# Patient Record
Sex: Female | Born: 1970 | Race: Black or African American | Hispanic: No | Marital: Married | Smoking: Never smoker
Health system: Southern US, Community
[De-identification: ages and names within clinical notes are randomized; demographics above are authoritative.]

## PROBLEM LIST (undated history)

## (undated) DIAGNOSIS — Z9221 Personal history of antineoplastic chemotherapy: Secondary | ICD-10-CM

## (undated) DIAGNOSIS — R519 Headache, unspecified: Secondary | ICD-10-CM

## (undated) DIAGNOSIS — C50919 Malignant neoplasm of unspecified site of unspecified female breast: Secondary | ICD-10-CM

## (undated) DIAGNOSIS — Z923 Personal history of irradiation: Secondary | ICD-10-CM

## (undated) DIAGNOSIS — R51 Headache: Secondary | ICD-10-CM

## (undated) DIAGNOSIS — A6000 Herpesviral infection of urogenital system, unspecified: Secondary | ICD-10-CM

## (undated) DIAGNOSIS — C801 Malignant (primary) neoplasm, unspecified: Secondary | ICD-10-CM

## (undated) DIAGNOSIS — D649 Anemia, unspecified: Secondary | ICD-10-CM

## (undated) DIAGNOSIS — K219 Gastro-esophageal reflux disease without esophagitis: Secondary | ICD-10-CM

## (undated) DIAGNOSIS — J189 Pneumonia, unspecified organism: Secondary | ICD-10-CM

## (undated) DIAGNOSIS — J45909 Unspecified asthma, uncomplicated: Secondary | ICD-10-CM

## (undated) HISTORY — DX: Malignant neoplasm of unspecified site of unspecified female breast: C50.919

## (undated) HISTORY — PX: MYOMECTOMY ABDOMINAL APPROACH: SUR870

## (undated) HISTORY — DX: Herpesviral infection of urogenital system, unspecified: A60.00

---

## 2000-08-13 ENCOUNTER — Other Ambulatory Visit: Admission: RE | Admit: 2000-08-13 | Discharge: 2000-08-13 | Payer: Self-pay | Admitting: Obstetrics and Gynecology

## 2001-10-06 ENCOUNTER — Other Ambulatory Visit: Admission: RE | Admit: 2001-10-06 | Discharge: 2001-10-06 | Payer: Self-pay | Admitting: Obstetrics and Gynecology

## 2002-11-22 ENCOUNTER — Other Ambulatory Visit: Admission: RE | Admit: 2002-11-22 | Discharge: 2002-11-22 | Payer: Self-pay | Admitting: Obstetrics and Gynecology

## 2003-02-02 ENCOUNTER — Encounter (INDEPENDENT_AMBULATORY_CARE_PROVIDER_SITE_OTHER): Payer: Self-pay

## 2003-02-02 ENCOUNTER — Ambulatory Visit (HOSPITAL_COMMUNITY): Admission: RE | Admit: 2003-02-02 | Discharge: 2003-02-02 | Payer: Self-pay | Admitting: Obstetrics and Gynecology

## 2003-11-26 ENCOUNTER — Other Ambulatory Visit: Admission: RE | Admit: 2003-11-26 | Discharge: 2003-11-26 | Payer: Self-pay | Admitting: Obstetrics and Gynecology

## 2005-01-22 ENCOUNTER — Other Ambulatory Visit: Admission: RE | Admit: 2005-01-22 | Discharge: 2005-01-22 | Payer: Self-pay | Admitting: Obstetrics and Gynecology

## 2006-08-17 ENCOUNTER — Inpatient Hospital Stay (HOSPITAL_COMMUNITY): Admission: RE | Admit: 2006-08-17 | Discharge: 2006-08-19 | Payer: Self-pay | Admitting: Obstetrics and Gynecology

## 2006-08-17 ENCOUNTER — Encounter (INDEPENDENT_AMBULATORY_CARE_PROVIDER_SITE_OTHER): Payer: Self-pay | Admitting: *Deleted

## 2007-03-09 ENCOUNTER — Encounter: Admission: RE | Admit: 2007-03-09 | Discharge: 2007-03-09 | Payer: Self-pay | Admitting: Family Medicine

## 2010-10-17 NOTE — Op Note (Signed)
NAME:  Paula Berry, Paula Berry              ACCOUNT NO.:  [id number]   MEDICAL RECORD NO.:  [id number]          PATIENT TYPE:  AMB   LOCATION:  SDC                           FACILITY:  [facility]   PHYSICIAN:  Guy Sandifer. Henderson Cloud, M.D. DATE OF BIRTH:  [birth year]   DATE OF PROCEDURE:  08/17/2006  DATE OF DISCHARGE:                               OPERATIVE REPORT   PREOPERATIVE DIAGNOSES:  1. Uterine leiomyomata.  2. Possible endometrial polyps.   POSTOPERATIVE DIAGNOSES:  1. Uterine leiomyomata.  2. Possible endometrial polyps.   PROCEDURE:  1. Abdominal uterine myomectomy.  2. Hysteroscopy.  3. Dilatation and curettage with 1% Xylocaine paracervical block.   SURGEON:  Harold Hedge, MD.   ASSISTANT:  Richardean Chimera, MD   ANESTHESIA:  General with endotracheal intubation.   SPECIMENS:  1. Uterine leiomyomata.  2. Endometrial curettings.  Both to pathology.   ESTIMATED BLOOD LOSS:  250 mL.   I&O WITH SORBITOL DISTENDING MEDIA:  45 mL deficit.   INDICATIONS AND CONSENT:  This patient is a 40 year old married black  female, G0, P0 with known enlarging uterine leiomyomata.  She has  increasingly heavy menses.  Sonohistogram in the office is suggestive of  some endometrial polypoid processes.  After careful discussion of the  options, abdominal uterine myomectomy, hysteroscopy, dilatation  curettage is discussed with the patient.  Potential risks and  complications are discussed preoperatively including but not limited to  infection, uterine perforation, bowel, bladder, ureteral damage,  bleeding requiring transfusion of blood products with possible  transfusion reaction, HIV and hepatitis acquisition, DVT, PE, pneumonia.  A possible recurrent uterine leiomyomata and abnormal bleeding is  discussed.  Possible fallopian tube obstruction and secondary  infertility and the need for cesarean section for delivery is also  discussed.  All questions are answered.   FINDINGS:  There is a shallow  ridge in the mid upper endometrial cavity  on the posterior wall.  There are no distinct polypoid processes.  Fallopian tube ostia are identified bilaterally.  Abdominally the uterus  has multiple leiomyomata on the uterine fundus and posterior uterine  wall ranging from 2 cm to approximately 8 cm.  Fallopian tube and  fimbriae are normal bilaterally, and the ovaries are normal bilaterally.   PROCEDURE:  The patient is taken to the operating room.  She is  identified, placed in dorsosupine position, and general anesthesia is  induced via endotracheal intubation.  She is placed in the dorsal  lithotomy position where she is prepped, bladder straight catheterized,  and she is draped in a sterile fashion.  Bivalve speculum is placed in  the vagina and anterior cervical lip is injected with 1% Xylocaine and  grasped with a single-tooth tenaculum.  Paracervical block of  approximately 20 mL total of 1% plain Xylocaine is placed in the 2, 4,  5, 7, 8, and 10 o'clock positions.  Cervix is gently progressively  dilated to a 23 dilator.  Diagnostic hysteroscope is placed in the  endocervical canal and advanced under direct visualization using  sorbitol distending media.  The above findings are noted.  Hysteroscope  is withdrawn.  Sharp curettage is carried out.  Tenaculum is removed,  and good hemostasis is noted.  The patient is then placed in the dorsal  supine position after the drapes are removed.  She is then reprepped  abdominally and vaginally; Foley catheter is placed, and the bladder is  drained, and she is draped in a sterile fashion.  Pfannenstiel incision  is used and dissection is carried out in layers to the peritoneal  cavity.  Peritoneum is entered and extended superiorly and inferiorly.  O'Sullivan-O'Connor retractor is placed.  The bladder blade is placed.  Both sides of the retractor are padded with folded towels to elevate the  retractor.  The bowel is packed away, and the  upper blade is placed.  The multiple leiomyomata are removed using cautery and blunt and sharp  dissection.  Each defect is then repaired in multiple layers with 0  Monocryl pops.  It did not appear that the endometrial cavity was  entered with any of these, although it was 1/2-3/4 of the depth of the  myometrium in some portions.  Fibroids numbered approximately 8 or 9.  At the end of the case, good hemostasis is noted.  Copious irrigation is  carried out, and all returns as clear.  Intercede is placed over the  anterior and posterior parts of the uterus.  Packs and retractors are  then removed.  The anterior peritoneum is closed in a running fashion  with 0 Monocryl suture which is also used to reapproximate the  pyramidalis muscle in the midline.  Anterior rectus fascia is closed in  a running fashion with 0 PDS suture, and the skin is closed with clips.  All sponge, instrument, and needle counts are correct, and the patient  is transferred to the recovery room in stable condition.      Guy Sandifer Henderson Cloud, M.D.  Electronically Signed     JET/MEDQ  D:  08/17/2006  T:  08/17/2006  Job:  [id number]

## 2010-10-17 NOTE — H&P (Signed)
   NAME:  Paula Berry, Paula Berry                       ACCOUNT NO.:  [id number]   MEDICAL RECORD NO.:  [id number]                   PATIENT TYPE:  AMB   LOCATION:  SDC                                  [facility]ILITY[facility]   PHYSICIAN:  Guy Sandifer. Arleta Creek, M.D.           DATE OF BIRTH:  September 01, 1970   DATE OF ADMISSION:  02/02/2003  DATE OF DISCHARGE:                                HISTORY & PHYSICAL   CHIEF COMPLAINT:  Heavy menses.   HISTORY OF PRESENT ILLNESS:  This patient is a 40 year old single black  female; G1, P1, complaining of increasingly heavy menses.  She has two to  three days of heavy bleeding, changing a tampon or pad every 1-2 hr.  On  ultrasound in my office on 12/11/02 the uterus measured  8.1 x 4.8 x 5.2 cm.  Multiple uterine leiomyomata were noted, ranging from 9  mm to 4.5 cm.  Sonohistogram was consistent with 18 mm endocavitary mass.  After discussion of the options of management, she is being admitted for  hysteroscopy D&C.   PAST MEDICAL HISTORY:  1. Herpes.  2. Childhood asthma.  3. History of headache.   PAST SURGICAL HISTORY:  Negative.   OBSTETRICAL HISTORY:  Termination x1.   MEDICATIONS:  1. Valtrex as needed.  2. Maxalt p.r.n. headache.   ALLERGIES:  NO KNOWN DRUG ALLERGIES.   SOCIAL HISTORY:  The patient denies tobacco, alcohol or drug abuse.   FAMILY HISTORY:  Positive for type 2 diabetes in maternal grandmother.  Lung  cancer in maternal grandmother.  Chronic hypertension in all grandparents.   REVIEW OF SYSTEMS:  NEUROLOGIC:  History of headaches, as above.  CARDIAC:  Denies chest pain.  PULMONARY:  History of childhood asthma; no trouble as  an adult.  GI:  Denies recent changes in bowel habits.   PHYSICAL EXAMINATION:  VITAL SIGNS:  Height 5 feet 10 inches, weight 164  pounds, blood pressure 118/64.  HEENT:  Without thyromegaly.  LUNGS:  Clear to auscultation.  HEART:  Regular rate and rhythm.  BACK:  Without CVA tenderness.  BREASTS:   Without mass, retraction, discharge.  ABDOMEN:  Soft and nontender; without masses.  PELVIC:  Vulva, vagina and cervix without lesions.  Uterus is retroverted,  six weeks in size.  Adnexa nontender without masses.  EXTREMITIES:  Grossly within normal limits.  NEUROLOGIC:  Grossly within normal limits.   ASSESSMENT:  Menorrhagia.   PLAN:  Hysteroscopy with resectoscope, dilatation and curettage.                                               Guy Sandifer Arleta Creek, M.D.    JET/MEDQ  D:  01/31/2003  T:  01/31/2003  Job:  [id number]

## 2010-10-17 NOTE — H&P (Signed)
NAME:  Paula Berry, Paula Berry              ACCOUNT NO.:  [id number]   MEDICAL RECORD NO.:  [id number]          PATIENT TYPE:  AMB   LOCATION:                                 FACILITY:   PHYSICIAN:  Guy Sandifer. Henderson Cloud, M.D. DATE OF BIRTH:  [birth year]   DATE OF ADMISSION:  DATE OF DISCHARGE:                              HISTORY & PHYSICAL   HISTORY AND PHYSICAL   For admission to [facility], [place], [place]  on August 17, 2006.   CHIEF COMPLAINT:  Heavy menses and fibroids.   HISTORY OF PRESENT ILLNESS:  This patient is a 40 year old, married,  black female, G0, P0 with known enlarging uterine leiomyomata.  She has  been having increasingly heavy menses.  Ultrasound in my office on  March 18, 2006 revealed multiple leiomyomata ranging from 7.8 cm to 1  cm.  After discussion of the options, the patient is being admitted for  uterine myomectomy.  Potential risks and complications have been  discussed preoperatively.   PAST MEDICAL HISTORY:  1. Lobar HSV.  2. Migraine headaches.   OBSTETRIC HISTORY:  TAB times 1.   FAMILY HISTORY:  Diabetes maternal grandmother.  Lung cancer paternal  grandmother.  Chronic hypertension all grandparents.   SOCIAL HISTORY:  Denies tobacco, alcohol or drug abuse.   MEDICATIONS:  Maxalt p.r.n.  Valtrex p.r.n.   ALLERGIES:  No known drug allergies.   REVIEW OF SYSTEMS:  Neuro:  History of migraine headache, as above.  Pulmonary:  Denies shortness of breath.  GI:  Denies recent changes in  bowel habits.  Cardiac:  Denies chest pain.   PHYSICAL EXAMINATION:  Height 5 feet 9 inches.  Weight 169 pounds.  Blood pressure 114/78.  HEENT:  Without thyromegaly.  Lungs:  Clear to  auscultation.  Heart:  Regular rate and rhythm.  Neck:  Without CVA  tenderness.  Breasts:  Without masses or discharge.  Abdomen:  Soft,  nontender without masses.  Pelvic exam:  Vulva, vagina and cervix  without lesion.  Uterus retroverted, filling the  pelvis.  Adnexal exam  compromised.  Extremities grossly within normal limits.  Neurological  exam:  Grossly within normal limits.   ASSESSMENT:  Uterine leiomyomata.   PLAN:  Abdominal uterine myomectomy.      Guy Sandifer Henderson Cloud, M.D.  Electronically Signed     JET/MEDQ  D:  08/06/2006  T:  08/06/2006  Job:  [id number]

## 2010-10-17 NOTE — Op Note (Signed)
NAME:  Paula Berry, Paula Berry                       ACCOUNT NO.:  [id number]   MEDICAL RECORD NO.:  [id number]                   PATIENT TYPE:  AMB   LOCATION:  SDC                                  [facility]   PHYSICIAN:  Guy Sandifer. Arleta Creek, M.D.           DATE OF BIRTH:  November 08, 1970   DATE OF PROCEDURE:  02/02/2003  DATE OF DISCHARGE:                                 OPERATIVE REPORT   PREOPERATIVE DIAGNOSIS:  Menorrhagia.   POSTOPERATIVE DIAGNOSIS:  Menorrhagia.   PROCEDURE:  1. Hysteroscopy with resection of endometrial masses.  2. Dilatation & curettage.   SURGEON:  Guy Sandifer. Henderson Cloud, M.D.   ANESTHESIA:  1. Monitored anesthesia care.  2. Xylocaine 1% paracervical block.   ESTIMATED BLOOD LOSS:  Less than 50 cc.   SPECIMENS:  1. Endometrial biopsy.  2. Endometrial curettings.   INPUT AND OUTPUT:  Sorbitol distending media 150 cc deficit, with much of  that on the floor.   INDICATIONS AND CONSENT:  This patient is a 40 year old black female, with  increasingly heavy menses.  Details are dictated in the history and  physical.  Hysteroscopy with resectoscope and dilatation and curettage has  been discussed.  The potential risks and complications were discussed  preoperatively, including but not limited to infection, uterine perforation;  bowel, bladder or ureteral damage; bleeding requiring transfusion of blood  products and possible transfusion reaction, HIV and Hepatitis acquisition;  DVT, PE and pneumonia; hysterectomy; recurrent abnormal bleeding.  All  questions are answered and consent is signed on the chart.   FINDINGS:  There are multiple 5-10 mm polypoid processes on the apex of the  endometrial canal, as well as along the posterior and side walls of the  endometrial canal.  Fallopian tube ostia are identified bilaterally.   PROCEDURE:  The patient is given IV antibiotics.  She is taken to the  operating room and placed in the dorsal supine position, where  sedation is  given.  She is then placed in the dorsal lithotomy position, where she is  gently prepped, bladder straight catheterized and draped in the sterile  fashion.  The anterior cervical lip is injected with 1% Xylocaine and  grasped with a single-toothed tenaculum.  Paracervical block is then placed  at the 2, 4, 5, 7, 8 and 10 o'clock positions; with approximately 20 cc  total of 1% Xylocaine plain.  The cervix is then progressively dilated to a  23 Pratt dilator, and a diagnostic hysteroscope is placed in the  endocervical canal and advanced under direct visualization using Sorbitol  distending media.  The above findings are noted.  Hysteroscope is withdrawn.  Cervix is dilated to a 33 Pratt dilator, and the resectoscope with the  single right-angled wire loop is placed, and the above-  described polypoid processes are removed and sent as a single specimen.  Hysteroscope is then withdrawn and sharp curettage is carried out.  The  tenaculum is removed.  Good hemostasis is noted.  All counts are correct.  The patient is awakened and taken to the recovery room in stable condition.                                               Guy Sandifer Arleta Creek, M.D.    JET/MEDQ  D:  02/02/2003  T:  02/03/2003  Job:  [id number]

## 2010-10-17 NOTE — Discharge Summary (Signed)
NAMECORINNA, Paula Berry              ACCOUNT NO.:  [id number]   MEDICAL RECORD NO.:  [id number]          PATIENT TYPE:  INP   LOCATION:  9312                          [facility]ACILITY:  [facility]   PHYSICIAN:  Guy Sandifer. Henderson Cloud, M.D. DATE OF BIRTH:  1970/09/02   DATE OF ADMISSION:  08/17/2006  DATE OF DISCHARGE:  08/19/2006                               DISCHARGE SUMMARY   ADMITTING DIAGNOSIS:  Uterine myomata and possible endometrial polyps.   DISCHARGE DIAGNOSIS:  Uterine  myomata and possible endometrial polyps.   PROCEDURE:  On August 17, 2006, abdominal uterine myomectomy,  hysteroscopy, dilatation and curettage.   REASON FOR ADMISSION:  This patient is a 40 year old, black female, G0  P0 with enlarging uterine myomata.  Details have been dictated in the  history and physical.  She is admitted for surgical management.   HOSPITAL COURSE:  The patient is admitted to the hospital, undergoes the  above procedure.  On the evening of surgery she is having some nausea,  controlled with medication.  Vital signs are stable.  She is afebrile  with good urine output.  On the first postoperative day, she is passing  flatus, has much less nausea, and is tolerating a regular diet.  Hemoglobin is 8.7, and pathology is pending.  On the day of discharge,  she is ambulating well, tolerating a regular diet.   CONDITION ON DISCHARGE:  Good.   DIET:  Regular as tolerated.   ACTIVITY:  No lifting, no operation of automobile, no vaginal entry.   She is to call the office if problems including, but not limited to,  temperature of 101 degrees, heavy vaginal bleeding, persistent nausea,  vomiting, or increasing pain.   MEDICATIONS:  1. Percocet 5/325 mg, #40, one to two p.o. q.6 h. p.r.n.  2. Ibuprofen 600 mg q.6 h. p.r.n.  3. Iron supplement, one p.o. b.i.d.   FOLLOWUP:  In the office in one week for staple removal and two weeks  for a post-op check.      Guy Sandifer Henderson Cloud, M.D.  Electronically  Signed     JET/MEDQ  D:  08/19/2006  T:  08/19/2006  Job:  [id number]

## 2013-05-12 ENCOUNTER — Other Ambulatory Visit: Payer: Self-pay | Admitting: Obstetrics and Gynecology

## 2013-05-12 DIAGNOSIS — N6310 Unspecified lump in the right breast, unspecified quadrant: Secondary | ICD-10-CM

## 2013-05-22 ENCOUNTER — Ambulatory Visit
Admission: RE | Admit: 2013-05-22 | Discharge: 2013-05-22 | Disposition: A | Discharge status: Home / Self Care | Payer: BC Managed Care – PPO | Source: Ambulatory Visit | Attending: Obstetrics and Gynecology | Admitting: Obstetrics and Gynecology

## 2013-05-22 DIAGNOSIS — N6310 Unspecified lump in the right breast, unspecified quadrant: Secondary | ICD-10-CM

## 2015-06-24 ENCOUNTER — Other Ambulatory Visit: Payer: Self-pay | Admitting: Obstetrics and Gynecology

## 2015-06-24 DIAGNOSIS — R928 Other abnormal and inconclusive findings on diagnostic imaging of breast: Secondary | ICD-10-CM

## 2015-07-12 ENCOUNTER — Ambulatory Visit
Admission: RE | Admit: 2015-07-12 | Discharge: 2015-07-12 | Disposition: A | Discharge status: Home / Self Care | Payer: BC Managed Care – PPO | Source: Ambulatory Visit | Attending: Obstetrics and Gynecology | Admitting: Obstetrics and Gynecology

## 2015-07-12 DIAGNOSIS — R928 Other abnormal and inconclusive findings on diagnostic imaging of breast: Secondary | ICD-10-CM

## 2015-08-05 ENCOUNTER — Encounter (HOSPITAL_COMMUNITY): Payer: BC Managed Care – PPO

## 2015-08-09 ENCOUNTER — Other Ambulatory Visit (HOSPITAL_COMMUNITY): Payer: Self-pay | Admitting: *Deleted

## 2015-08-12 ENCOUNTER — Encounter (HOSPITAL_COMMUNITY)
Admission: RE | Admit: 2015-08-12 | Discharge: 2015-08-12 | Disposition: A | Discharge status: Home / Self Care | Payer: BC Managed Care – PPO | Source: Ambulatory Visit | Attending: Obstetrics and Gynecology | Admitting: Obstetrics and Gynecology

## 2015-08-12 ENCOUNTER — Ambulatory Visit (HOSPITAL_COMMUNITY)
Admission: RE | Admit: 2015-08-12 | Discharge: 2015-08-12 | Disposition: A | Discharge status: Home / Self Care | Payer: BC Managed Care – PPO | Source: Ambulatory Visit | Attending: Obstetrics and Gynecology | Admitting: Obstetrics and Gynecology

## 2015-08-12 ENCOUNTER — Encounter (HOSPITAL_COMMUNITY): Payer: Self-pay

## 2015-08-12 DIAGNOSIS — Z01812 Encounter for preprocedural laboratory examination: Secondary | ICD-10-CM | POA: Insufficient documentation

## 2015-08-12 DIAGNOSIS — Z01818 Encounter for other preprocedural examination: Secondary | ICD-10-CM | POA: Insufficient documentation

## 2015-08-12 DIAGNOSIS — D649 Anemia, unspecified: Secondary | ICD-10-CM | POA: Insufficient documentation

## 2015-08-12 DIAGNOSIS — R7989 Other specified abnormal findings of blood chemistry: Secondary | ICD-10-CM | POA: Diagnosis not present

## 2015-08-12 HISTORY — DX: Gastro-esophageal reflux disease without esophagitis: K21.9

## 2015-08-12 HISTORY — DX: Headache: R51

## 2015-08-12 HISTORY — DX: Pneumonia, unspecified organism: J18.9

## 2015-08-12 HISTORY — DX: Unspecified asthma, uncomplicated: J45.909

## 2015-08-12 HISTORY — DX: Headache, unspecified: R51.9

## 2015-08-12 HISTORY — DX: Anemia, unspecified: D64.9

## 2015-08-12 LAB — COMPREHENSIVE METABOLIC PANEL
ALBUMIN: 4 g/dL (ref 3.5–5.0)
ALT: 9 U/L — ABNORMAL LOW (ref 14–54)
ANION GAP: 6 (ref 5–15)
AST: 15 U/L (ref 15–41)
Alkaline Phosphatase: 53 U/L (ref 38–126)
BUN: 11 mg/dL (ref 6–20)
CHLORIDE: 105 mmol/L (ref 101–111)
CO2: 26 mmol/L (ref 22–32)
Calcium: 8.9 mg/dL (ref 8.9–10.3)
Creatinine, Ser: 0.63 mg/dL (ref 0.44–1.00)
GFR calc non Af Amer: >60 mL/min (ref >60)
GLUCOSE: 101 mg/dL — AB (ref 65–99)
POTASSIUM: 4 mmol/L (ref 3.5–5.1)
SODIUM: 137 mmol/L (ref 135–145)
Total Bilirubin: 1.2 mg/dL (ref 0.3–1.2)
Total Protein: 7.3 g/dL (ref 6.5–8.1)

## 2015-08-12 LAB — CBC
HEMATOCRIT: 35.4 % — AB (ref 36.0–46.0)
HEMOGLOBIN: 11.1 g/dL — AB (ref 12.0–15.0)
MCH: 25.5 pg — AB (ref 26.0–34.0)
MCHC: 31.4 g/dL (ref 30.0–36.0)
MCV: 81.2 fL (ref 78.0–100.0)
Platelets: 256 10*3/uL (ref 150–400)
RBC: 4.36 MIL/uL (ref 3.87–5.11)
RDW: 17.4 % — ABNORMAL HIGH (ref 11.5–15.5)
WBC: 7.1 10*3/uL (ref 4.0–10.5)

## 2015-08-12 LAB — TYPE AND SCREEN
ABO/RH(D): AB POS
ANTIBODY SCREEN: NEGATIVE

## 2015-08-12 LAB — ABO/RH: ABO/RH(D): AB POS

## 2015-08-12 MED ORDER — SODIUM CHLORIDE 0.9 % IV SOLN
510.0000 mg | Freq: Once | INTRAVENOUS | Status: AC
  Administered 2015-08-12: 510 mg via INTRAVENOUS
  Filled 2015-08-12: qty 17

## 2015-08-12 NOTE — H&P (Signed)
Paula Berry is an 45 y.o. female with pelvic pressure, urinary frequency and heavy menses. U/S in office C/W uterine fibroids with total uterus 22.5x9.5x13.9 cm. No adnexal masses seen.  Pertinent Gynecological History: Menses: flow is excessive with use of many pads or tampons on heaviest days Bleeding: dysfunctional uterine bleeding Contraception: none DES exposure: denies Blood transfusions: none Sexually transmitted diseases: no past history Previous GYN Procedures: none  Last mammogram: normal Date: 2/17 Last pap: normal Date: 1/17 OB History: G1, P0   Menstrual History: Menarche age: unknown  No LMP recorded.    Past Medical History  Diagnosis Date  . Asthma   . Pneumonia     hospitalizied for pneumonia as a child  . GERD (gastroesophageal reflux disease)   . Headache     migraines 2-3 times monthly  . Anemia     had iron infusion 08/12/15    Past Surgical History  Procedure Laterality Date  . Myomectomy abdominal approach      No family history on file.  Social History:  reports that she has never smoked. She has never used smokeless tobacco. She reports that she drinks about 0.6 oz of alcohol per week. She reports that she does not use illicit drugs.  Allergies:  Allergies  Allergen Reactions  . Neosporin [Neomycin-Bacitracin Zn-Polymyx]     No prescriptions prior to admission    Review of Systems  Constitutional: Negative for fever.    There were no vitals taken for this visit. Physical Exam  Cardiovascular: Normal rate and regular rhythm.   Respiratory: Effort normal and breath sounds normal.  GI: Soft. She exhibits mass.  Mass arising from pelvis to umbilicus  Genitourinary:  Pelvic exam compromised by uterus enlarged to about 20 weeks size    Results for orders placed or performed during the hospital encounter of 08/12/15 (from the past 24 hour(s))  CBC *Canceled*     Status: None ()   Collection Time: 08/12/15  1:46 PM   Narrative   LIS Cancel (ORR/DE = Data Error)  Comprehensive metabolic panel *Canceled*     Status: None ()   Collection Time: 08/12/15  1:46 PM   Narrative   LIS Cancel (ORR/DE = Data Error)  Type and screen [place] *Canceled*     Status: None ()   Collection Time: 08/12/15  1:46 PM   Narrative   LIS Cancel (ORR/DE = Data Error)  CBC     Status: Abnormal   Collection Time: 08/12/15  3:15 PM  Result Value Ref Range   WBC 7.1 4.0 - 10.5 K/uL   RBC 4.36 3.87 - 5.11 MIL/uL   Hemoglobin 11.1 (L) 12.0 - 15.0 g/dL   HCT 35.4 (L) 36.0 - 46.0 %   MCV 81.2 78.0 - 100.0 fL   MCH 25.5 (L) 26.0 - 34.0 pg   MCHC 31.4 30.0 - 36.0 g/dL   RDW 17.4 (H) 11.5 - 15.5 %   Platelets 256 150 - 400 K/uL  Type and screen [place]     Status: None   Collection Time: 08/12/15  3:15 PM  Result Value Ref Range   ABO/RH(D) AB POS    Antibody Screen NEG    Sample Expiration 08/26/2015    Extend sample reason NO TRANSFUSIONS OR PREGNANCY IN THE PAST 3 MONTHS   Comprehensive metabolic panel     Status: Abnormal   Collection Time: 08/12/15  3:15 PM  Result Value Ref Range   Sodium  137 135 - 145 mmol/L   Potassium 4.0 3.5 - 5.1 mmol/L   Chloride 105 101 - 111 mmol/L   CO2 26 22 - 32 mmol/L   Glucose, Bld 101 (H) 65 - 99 mg/dL   BUN 11 6 - 20 mg/dL   Creatinine, Ser 0.63 0.44 - 1.00 mg/dL   Calcium 8.9 8.9 - 10.3 mg/dL   Total Protein 7.3 6.5 - 8.1 g/dL   Albumin 4.0 3.5 - 5.0 g/dL   AST 15 15 - 41 U/L   ALT 9 (L) 14 - 54 U/L   Alkaline Phosphatase 53 38 - 126 U/L   Total Bilirubin 1.2 0.3 - 1.2 mg/dL   GFR calc non Af Amer >60 >60 mL/min   GFR calc Af Amer >60 >60 mL/min   Anion gap 6 5 - 15    No results found.   Feraheme infusion done pre-op  Assessment/Plan: 45 yo MBF G1P0 with large, symptomatic fibroids D/W patient TAH/BS with removal of one or both ovaries if abnormal D/W risks including infection, organ damage, bleeding/transfusion-HIV/Hep,  DVT/PE, pneumonia, return to OR, pelvic pain, abdominal pain, painful intercourse, fistula. All questions answered. Patient states she understands and agrees  Benjiman Core E 08/12/2015, 5:44 PM

## 2015-08-12 NOTE — Patient Instructions (Addendum)
   Your procedure is scheduled on: MARCH 23 (THURSDAY)  Enter through the Main Entrance of [facility] at: 6AM   Pick up the phone at the desk and dial [phone] and inform us of your arrival.  Please call this number if you have any problems the morning of surgery: [phone]  DO NOT EAT OR DRINK AFTER MIDNIGHT MARCH 22   [name]  Do not wear jewelry, make-up, No metal in your hair or on your body. Do not wear lotions, powders, perfumes.  You may wear deodorant.  Do not bring valuables to the hospital. Contacts, dentures or bridgework may not be worn into surgery.  Leave suitcase in the car. After Surgery it may be brought to your room. For patients being admitted to the hospital, checkout time is 11:00am the day of discharge.

## 2015-08-15 ENCOUNTER — Ambulatory Visit (HOSPITAL_COMMUNITY)
Admission: RE | Admit: 2015-08-15 | Discharge: 2015-08-15 | Disposition: A | Discharge status: Home / Self Care | Payer: BC Managed Care – PPO | Source: Ambulatory Visit | Attending: Obstetrics and Gynecology | Admitting: Obstetrics and Gynecology

## 2015-08-15 DIAGNOSIS — R7989 Other specified abnormal findings of blood chemistry: Secondary | ICD-10-CM | POA: Diagnosis not present

## 2015-08-15 DIAGNOSIS — D649 Anemia, unspecified: Secondary | ICD-10-CM | POA: Insufficient documentation

## 2015-08-15 MED ORDER — SODIUM CHLORIDE 0.9 % IV SOLN
510.0000 mg | Freq: Once | INTRAVENOUS | Status: AC
  Administered 2015-08-15: 510 mg via INTRAVENOUS
  Filled 2015-08-15: qty 17

## 2015-08-21 ENCOUNTER — Encounter (HOSPITAL_COMMUNITY): Payer: Self-pay | Admitting: Anesthesiology

## 2015-08-21 NOTE — Anesthesia Preprocedure Evaluation (Addendum)
Anesthesia Evaluation  Patient identified by MRN, date of birth, ID band Patient awake    Reviewed: Allergy & Precautions, H&P , NPO status , Patient's Chart, lab work & pertinent test results  Airway Mallampati: II  TM Distance: >3 FB Neck ROM: full    Dental no notable dental hx. (+) Teeth Intact   Pulmonary  Will use Albuterol inhaler prior to OR.   Pulmonary exam normal        Cardiovascular negative cardio ROS Normal cardiovascular exam     Neuro/Psych negative psych ROS   GI/Hepatic Neg liver ROS, GERD  Medicated and Controlled,  Endo/Other  negative endocrine ROS  Renal/GU negative Renal ROS     Musculoskeletal   Abdominal (+) + obese,   Peds  Hematology   Anesthesia Other Findings   Reproductive/Obstetrics negative OB ROS                            Anesthesia Physical Anesthesia Plan  ASA: II  Anesthesia Plan: General   Post-op Pain Management:    Induction: Intravenous  Airway Management Planned: Oral ETT  Additional Equipment:   Intra-op Plan:   Post-operative Plan: Extubation in OR  Informed Consent: I have reviewed the patients History and Physical, chart, labs and discussed the procedure including the risks, benefits and alternatives for the proposed anesthesia with the patient or authorized representative who has indicated his/her understanding and acceptance.   Dental advisory given  Plan Discussed with: CRNA and Surgeon  Anesthesia Plan Comments:        Anesthesia Quick Evaluation

## 2015-08-22 ENCOUNTER — Inpatient Hospital Stay (HOSPITAL_COMMUNITY): Payer: BC Managed Care – PPO | Admitting: Anesthesiology

## 2015-08-22 ENCOUNTER — Encounter (HOSPITAL_COMMUNITY)
Admission: RE | Disposition: A | Discharge status: Home / Self Care | Payer: Self-pay | Source: Ambulatory Visit | Attending: Obstetrics and Gynecology

## 2015-08-22 ENCOUNTER — Encounter (HOSPITAL_COMMUNITY): Payer: Self-pay

## 2015-08-22 ENCOUNTER — Inpatient Hospital Stay (HOSPITAL_COMMUNITY)
Admission: RE | Admit: 2015-08-22 | Discharge: 2015-08-24 | DRG: 743 | Disposition: A | Discharge status: Home / Self Care | Payer: BC Managed Care – PPO | Source: Ambulatory Visit | Attending: Obstetrics and Gynecology | Admitting: Obstetrics and Gynecology

## 2015-08-22 DIAGNOSIS — N938 Other specified abnormal uterine and vaginal bleeding: Secondary | ICD-10-CM | POA: Diagnosis present

## 2015-08-22 DIAGNOSIS — D259 Leiomyoma of uterus, unspecified: Secondary | ICD-10-CM | POA: Diagnosis present

## 2015-08-22 DIAGNOSIS — K219 Gastro-esophageal reflux disease without esophagitis: Secondary | ICD-10-CM | POA: Diagnosis present

## 2015-08-22 DIAGNOSIS — D649 Anemia, unspecified: Secondary | ICD-10-CM | POA: Diagnosis present

## 2015-08-22 DIAGNOSIS — D251 Intramural leiomyoma of uterus: Principal | ICD-10-CM | POA: Diagnosis present

## 2015-08-22 DIAGNOSIS — D252 Subserosal leiomyoma of uterus: Secondary | ICD-10-CM | POA: Diagnosis present

## 2015-08-22 DIAGNOSIS — G43909 Migraine, unspecified, not intractable, without status migrainosus: Secondary | ICD-10-CM | POA: Diagnosis present

## 2015-08-22 DIAGNOSIS — J45909 Unspecified asthma, uncomplicated: Secondary | ICD-10-CM | POA: Diagnosis present

## 2015-08-22 DIAGNOSIS — R102 Pelvic and perineal pain: Secondary | ICD-10-CM | POA: Diagnosis present

## 2015-08-22 DIAGNOSIS — D219 Benign neoplasm of connective and other soft tissue, unspecified: Secondary | ICD-10-CM | POA: Diagnosis present

## 2015-08-22 HISTORY — PX: ABDOMINAL HYSTERECTOMY: SHX81

## 2015-08-22 LAB — HCG, SERUM, QUALITATIVE: PREG SERUM: NEGATIVE

## 2015-08-22 SURGERY — HYSTERECTOMY, ABDOMINAL
Anesthesia: General | Site: Abdomen | Laterality: Bilateral

## 2015-08-22 MED ORDER — OXYCODONE-ACETAMINOPHEN 5-325 MG PO TABS
1.0000 | ORAL_TABLET | ORAL | Status: DC | PRN
  Administered 2015-08-23 (×2): 2 via ORAL
  Administered 2015-08-23: 1 via ORAL
  Administered 2015-08-24 (×2): 2 via ORAL
  Filled 2015-08-22 (×2): qty 2
  Filled 2015-08-22: qty 1
  Filled 2015-08-22 (×2): qty 2

## 2015-08-22 MED ORDER — LIDOCAINE HCL (CARDIAC) 20 MG/ML IV SOLN
INTRAVENOUS | Status: DC | PRN
  Administered 2015-08-22: 100 mg via INTRAVENOUS

## 2015-08-22 MED ORDER — ROCURONIUM BROMIDE 100 MG/10ML IV SOLN
INTRAVENOUS | Status: DC | PRN
  Administered 2015-08-22: 60 mg via INTRAVENOUS

## 2015-08-22 MED ORDER — IBUPROFEN 600 MG PO TABS: 600.0000 mg | ORAL_TABLET | Freq: Four times a day (QID) | ORAL | Status: DC | PRN

## 2015-08-22 MED ORDER — ONDANSETRON HCL 4 MG PO TABS: 4.0000 mg | ORAL_TABLET | Freq: Four times a day (QID) | ORAL | Status: DC | PRN

## 2015-08-22 MED ORDER — ONDANSETRON HCL 4 MG/2ML IJ SOLN
INTRAMUSCULAR | Status: AC
  Filled 2015-08-22: qty 2

## 2015-08-22 MED ORDER — DEXAMETHASONE SODIUM PHOSPHATE 4 MG/ML IJ SOLN
INTRAMUSCULAR | Status: AC
  Filled 2015-08-22: qty 1

## 2015-08-22 MED ORDER — ONDANSETRON HCL 4 MG/2ML IJ SOLN
INTRAMUSCULAR | Status: DC | PRN
  Administered 2015-08-22: 4 mg via INTRAVENOUS

## 2015-08-22 MED ORDER — FENTANYL CITRATE (PF) 100 MCG/2ML IJ SOLN
INTRAMUSCULAR | Status: DC | PRN
  Administered 2015-08-22: 150 ug via INTRAVENOUS
  Administered 2015-08-22: 100 ug via INTRAVENOUS
  Administered 2015-08-22 (×2): 50 ug via INTRAVENOUS

## 2015-08-22 MED ORDER — FENTANYL CITRATE (PF) 250 MCG/5ML IJ SOLN
INTRAMUSCULAR | Status: AC
  Filled 2015-08-22: qty 5

## 2015-08-22 MED ORDER — HYDROMORPHONE 1 MG/ML IV SOLN
INTRAVENOUS | Status: DC
  Administered 2015-08-22: 1.6 mg via INTRAVENOUS
  Administered 2015-08-23: 1 mg via INTRAVENOUS
  Administered 2015-08-23: 2.8 mg via INTRAVENOUS
  Administered 2015-08-23: 2 mg via INTRAVENOUS

## 2015-08-22 MED ORDER — CEFAZOLIN SODIUM-DEXTROSE 2-3 GM-% IV SOLR
INTRAVENOUS | Status: AC
  Filled 2015-08-22: qty 50

## 2015-08-22 MED ORDER — ROCURONIUM BROMIDE 100 MG/10ML IV SOLN
INTRAVENOUS | Status: AC
  Filled 2015-08-22: qty 1

## 2015-08-22 MED ORDER — NALOXONE HCL 0.4 MG/ML IJ SOLN: 0.4000 mg | INTRAMUSCULAR | Status: DC | PRN

## 2015-08-22 MED ORDER — SCOPOLAMINE 1 MG/3DAYS TD PT72
1.0000 | MEDICATED_PATCH | Freq: Once | TRANSDERMAL | Status: DC
  Administered 2015-08-22: 1.5 mg via TRANSDERMAL

## 2015-08-22 MED ORDER — DIPHENHYDRAMINE HCL 50 MG/ML IJ SOLN
12.5000 mg | Freq: Four times a day (QID) | INTRAMUSCULAR | Status: DC | PRN
  Administered 2015-08-22 – 2015-08-23 (×2): 12.5 mg via INTRAVENOUS
  Filled 2015-08-22 (×2): qty 1

## 2015-08-22 MED ORDER — BUPIVACAINE LIPOSOME 1.3 % IJ SUSP
20.0000 mL | Freq: Once | INTRAMUSCULAR | Status: DC
  Filled 2015-08-22: qty 20

## 2015-08-22 MED ORDER — PROMETHAZINE HCL 25 MG/ML IJ SOLN: 6.2500 mg | INTRAMUSCULAR | Status: DC | PRN

## 2015-08-22 MED ORDER — MIDAZOLAM HCL 2 MG/2ML IJ SOLN
INTRAMUSCULAR | Status: AC
  Filled 2015-08-22: qty 2

## 2015-08-22 MED ORDER — MENTHOL 3 MG MT LOZG: 1.0000 | LOZENGE | OROMUCOSAL | Status: DC | PRN

## 2015-08-22 MED ORDER — HYDROMORPHONE HCL 1 MG/ML IJ SOLN
INTRAMUSCULAR | Status: AC
  Filled 2015-08-22: qty 1

## 2015-08-22 MED ORDER — HYDROMORPHONE HCL 1 MG/ML IJ SOLN
INTRAMUSCULAR | Status: AC
  Administered 2015-08-22: 0.25 mg via INTRAVENOUS
  Filled 2015-08-22: qty 1

## 2015-08-22 MED ORDER — SUGAMMADEX SODIUM 200 MG/2ML IV SOLN
INTRAVENOUS | Status: DC | PRN
  Administered 2015-08-22: 200 mg via INTRAVENOUS

## 2015-08-22 MED ORDER — ONDANSETRON HCL 4 MG/2ML IJ SOLN: 4.0000 mg | Freq: Four times a day (QID) | INTRAMUSCULAR | Status: DC | PRN

## 2015-08-22 MED ORDER — DEXAMETHASONE SODIUM PHOSPHATE 4 MG/ML IJ SOLN
INTRAMUSCULAR | Status: DC | PRN
  Administered 2015-08-22: 4 mg via INTRAVENOUS

## 2015-08-22 MED ORDER — LACTATED RINGERS IV SOLN
INTRAVENOUS | Status: DC
  Administered 2015-08-22: 18:00:00 via INTRAVENOUS

## 2015-08-22 MED ORDER — KETOROLAC TROMETHAMINE 30 MG/ML IJ SOLN
30.0000 mg | Freq: Once | INTRAMUSCULAR | Status: AC
  Administered 2015-08-22: 30 mg via INTRAVENOUS

## 2015-08-22 MED ORDER — LACTATED RINGERS IV SOLN
INTRAVENOUS | Status: DC
  Administered 2015-08-22: 09:00:00 via INTRAVENOUS
  Administered 2015-08-22: 125 mL/h via INTRAVENOUS
  Administered 2015-08-22: 08:00:00 via INTRAVENOUS

## 2015-08-22 MED ORDER — HYDROMORPHONE 1 MG/ML IV SOLN
INTRAVENOUS | Status: DC
  Administered 2015-08-22: 13:00:00 via INTRAVENOUS
  Administered 2015-08-22: 2.6 mg via INTRAVENOUS
  Filled 2015-08-22: qty 25

## 2015-08-22 MED ORDER — CEFAZOLIN SODIUM-DEXTROSE 2-4 GM/100ML-% IV SOLN
2.0000 g | INTRAVENOUS | Status: AC
  Administered 2015-08-22: 2 g via INTRAVENOUS
  Filled 2015-08-22: qty 100

## 2015-08-22 MED ORDER — PANTOPRAZOLE SODIUM 40 MG PO TBEC
40.0000 mg | DELAYED_RELEASE_TABLET | Freq: Every day | ORAL | Status: DC
  Administered 2015-08-23: 40 mg via ORAL
  Filled 2015-08-22: qty 1

## 2015-08-22 MED ORDER — BUPIVACAINE LIPOSOME 1.3 % IJ SUSP
INTRAMUSCULAR | Status: DC | PRN
  Administered 2015-08-22: 20 mL

## 2015-08-22 MED ORDER — MEPERIDINE HCL 25 MG/ML IJ SOLN: 6.2500 mg | INTRAMUSCULAR | Status: DC | PRN

## 2015-08-22 MED ORDER — SCOPOLAMINE 1 MG/3DAYS TD PT72
MEDICATED_PATCH | TRANSDERMAL | Status: DC
Start: 2015-08-22 — End: 2015-08-24
  Administered 2015-08-22: 1.5 mg via TRANSDERMAL
  Filled 2015-08-22: qty 1

## 2015-08-22 MED ORDER — MIDAZOLAM HCL 5 MG/5ML IJ SOLN
INTRAMUSCULAR | Status: DC | PRN
  Administered 2015-08-22: 2 mg via INTRAVENOUS

## 2015-08-22 MED ORDER — SIMETHICONE 80 MG PO CHEW: 80.0000 mg | CHEWABLE_TABLET | Freq: Four times a day (QID) | ORAL | Status: DC | PRN

## 2015-08-22 MED ORDER — LIDOCAINE HCL (CARDIAC) 20 MG/ML IV SOLN
INTRAVENOUS | Status: AC
  Filled 2015-08-22: qty 5

## 2015-08-22 MED ORDER — SENNA 8.6 MG PO TABS
1.0000 | ORAL_TABLET | Freq: Two times a day (BID) | ORAL | Status: DC
  Administered 2015-08-22 – 2015-08-23 (×3): 8.6 mg via ORAL
  Filled 2015-08-22 (×5): qty 1

## 2015-08-22 MED ORDER — SODIUM CHLORIDE 0.9 % IJ SOLN
INTRAMUSCULAR | Status: DC | PRN
  Administered 2015-08-22: 20 mL

## 2015-08-22 MED ORDER — ZOLPIDEM TARTRATE 5 MG PO TABS: 5.0000 mg | ORAL_TABLET | Freq: Every evening | ORAL | Status: DC | PRN

## 2015-08-22 MED ORDER — PROPOFOL 10 MG/ML IV BOLUS
INTRAVENOUS | Status: DC | PRN
  Administered 2015-08-22: 200 mg via INTRAVENOUS

## 2015-08-22 MED ORDER — HYDROMORPHONE HCL 1 MG/ML IJ SOLN
0.2500 mg | INTRAMUSCULAR | Status: DC | PRN
  Administered 2015-08-22: 0.5 mg via INTRAVENOUS
  Administered 2015-08-22 (×3): 0.25 mg via INTRAVENOUS

## 2015-08-22 MED ORDER — PROPOFOL 10 MG/ML IV BOLUS
INTRAVENOUS | Status: AC
  Filled 2015-08-22: qty 20

## 2015-08-22 MED ORDER — MAGNESIUM HYDROXIDE 400 MG/5ML PO SUSP: 30.0000 mL | Freq: Every day | ORAL | Status: DC | PRN

## 2015-08-22 MED ORDER — SODIUM CHLORIDE 0.9% FLUSH: 9.0000 mL | INTRAVENOUS | Status: DC | PRN

## 2015-08-22 MED ORDER — KETOROLAC TROMETHAMINE 30 MG/ML IJ SOLN
INTRAMUSCULAR | Status: AC
  Filled 2015-08-22: qty 1

## 2015-08-22 MED ORDER — DIPHENHYDRAMINE HCL 12.5 MG/5ML PO ELIX
12.5000 mg | ORAL_SOLUTION | Freq: Four times a day (QID) | ORAL | Status: DC | PRN
  Filled 2015-08-22: qty 5

## 2015-08-22 MED ORDER — SODIUM CHLORIDE 0.9 % IJ SOLN
INTRAMUSCULAR | Status: AC
  Filled 2015-08-22: qty 20

## 2015-08-22 MED ORDER — SUGAMMADEX SODIUM 200 MG/2ML IV SOLN
INTRAVENOUS | Status: AC
  Filled 2015-08-22: qty 2

## 2015-08-22 MED ORDER — ALBUTEROL SULFATE (2.5 MG/3ML) 0.083% IN NEBU: 3.0000 mL | INHALATION_SOLUTION | Freq: Four times a day (QID) | RESPIRATORY_TRACT | Status: DC | PRN

## 2015-08-22 MED ORDER — HYDROMORPHONE HCL 1 MG/ML IJ SOLN
INTRAMUSCULAR | Status: DC | PRN
  Administered 2015-08-22 (×2): 0.5 mg via INTRAVENOUS

## 2015-08-22 SURGICAL SUPPLY — 39 items
APL SKNCLS STERI-STRIP NONHPOA (GAUZE/BANDAGES/DRESSINGS) ×1
BENZOIN TINCTURE PRP APPL 2/3 (GAUZE/BANDAGES/DRESSINGS) ×3 IMPLANT
CANISTER SUCT 3000ML (MISCELLANEOUS) ×3 IMPLANT
CLOSURE WOUND 1/2 X4 (GAUZE/BANDAGES/DRESSINGS) ×1
CLOTH BEACON ORANGE TIMEOUT ST (SAFETY) ×3 IMPLANT
CONT PATH 16OZ SNAP LID 3702 (MISCELLANEOUS) ×3 IMPLANT
DECANTER SPIKE VIAL GLASS SM (MISCELLANEOUS) ×4 IMPLANT
DRAPE C SECTION CLR SCREEN (DRAPES) ×2 IMPLANT
DRAPE WARM FLUID 44X44 (DRAPE) ×3 IMPLANT
DRSG OPSITE POSTOP 4X10 (GAUZE/BANDAGES/DRESSINGS) ×4 IMPLANT
DURAPREP 26ML APPLICATOR (WOUND CARE) ×3 IMPLANT
ELECT LIGASURE SHORT 9 REUSE (ELECTRODE) ×2 IMPLANT
GAUZE SPONGE 4X4 16PLY XRAY LF (GAUZE/BANDAGES/DRESSINGS) IMPLANT
GLOVE BIO SURGEON STRL SZ8 (GLOVE) ×6 IMPLANT
GLOVE BIOGEL PI IND STRL 7.0 (GLOVE) ×1 IMPLANT
GLOVE BIOGEL PI IND STRL 8 (GLOVE) ×1 IMPLANT
GLOVE BIOGEL PI INDICATOR 7.0 (GLOVE) ×2
GLOVE BIOGEL PI INDICATOR 8 (GLOVE) ×2
GOWN STRL REUS W/TWL LRG LVL3 (GOWN DISPOSABLE) ×9 IMPLANT
NEEDLE HYPO 22GX1.5 SAFETY (NEEDLE) ×3 IMPLANT
NS IRRIG 1000ML POUR BTL (IV SOLUTION) ×3 IMPLANT
PACK ABDOMINAL GYN (CUSTOM PROCEDURE TRAY) ×3 IMPLANT
PAD OB MATERNITY 4.3X12.25 (PERSONAL CARE ITEMS) ×3 IMPLANT
PENCIL SMOKE EVAC W/HOLSTER (ELECTROSURGICAL) ×3 IMPLANT
SPONGE LAP 18X18 X RAY DECT (DISPOSABLE) ×2 IMPLANT
STAPLER VISISTAT 35W (STAPLE) IMPLANT
STRIP CLOSURE SKIN 1/2X4 (GAUZE/BANDAGES/DRESSINGS) ×2 IMPLANT
SUT MNCRL 0 MO-4 VIOLET 18 CR (SUTURE) ×3 IMPLANT
SUT MNCRL 0 VIOLET 6X18 (SUTURE) ×1 IMPLANT
SUT MNCRL AB 0 CT1 27 (SUTURE) ×3 IMPLANT
SUT MONOCRYL 0 6X18 (SUTURE) ×2
SUT MONOCRYL 0 MO 4 18  CR/8 (SUTURE) ×2
SUT PDS AB 0 CTX 60 (SUTURE) ×6 IMPLANT
SUT PLAIN 2 0 XLH (SUTURE) IMPLANT
SUT VIC AB 4-0 KS 27 (SUTURE) ×2 IMPLANT
SYR 20CC LL (SYRINGE) ×6 IMPLANT
TOWEL OR 17X24 6PK STRL BLUE (TOWEL DISPOSABLE) ×6 IMPLANT
TRAY FOLEY BAG SILVER LF 16FR (SET/KITS/TRAYS/PACK) ×1 IMPLANT
TRAY FOLEY CATH SILVER 14FR (SET/KITS/TRAYS/PACK) ×3 IMPLANT

## 2015-08-22 NOTE — Anesthesia Procedure Notes (Signed)
Procedure Name: Intubation Date/Time: 08/22/2015 7:26 AM Performed by: Talbot Grumbling Pre-anesthesia Checklist: Patient identified, Emergency Drugs available, Suction available and Patient being monitored Patient Re-evaluated:Patient Re-evaluated prior to inductionOxygen Delivery Method: Circle system utilized Preoxygenation: Pre-oxygenation with 100% oxygen Intubation Type: IV induction Ventilation: Mask ventilation without difficulty Laryngoscope Size: Miller and 2 Grade View: Grade I Tube type: Oral Tube size: 7.0 mm Number of attempts: 1 Airway Equipment and Method: Stylet Placement Confirmation: ETT inserted through vocal cords under direct vision,  positive ETCO2 and breath sounds checked- equal and bilateral Secured at: 22 cm Tube secured with: Tape Dental Injury: Teeth and Oropharynx as per pre-operative assessment

## 2015-08-22 NOTE — Progress Notes (Signed)
Good pain relief Ambulating + flatus  VSS Afeb UO clear Lungs CTA Corr RRR Abd soft, dressing C&D, good BS LE PAS on  A/P: stable

## 2015-08-22 NOTE — Anesthesia Postprocedure Evaluation (Signed)
Anesthesia Post Note  Patient: Paula Berry  Procedure(s) Performed: Procedure(s) (LRB): HYSTERECTOMY ABDOMINAL, BILATERAL SALPINGECTOMY (Bilateral)  Patient location during evaluation: [facility] Anesthesia Type: General Level of consciousness: awake and alert Pain management: pain level controlled Vital Signs Assessment: post-procedure vital signs reviewed and stable Respiratory status: spontaneous breathing, nonlabored ventilation, respiratory function stable and patient connected to nasal cannula oxygen Cardiovascular status: blood pressure returned to baseline and stable Postop Assessment: no signs of nausea or vomiting Anesthetic complications: no    Last Vitals:  Filed Vitals:   08/22/15 1250 08/22/15 1330  BP:  123/75  Pulse:  50  Temp:  36.4 C  Resp: 12 13    Last Pain:  Filed Vitals:   08/22/15 1335  PainSc: Asleep                 Maire Govan

## 2015-08-22 NOTE — Op Note (Signed)
NAME:  IRAIS, AULBACH       ACCOUNT NO.:  [id number]  MEDICAL RECORD NO.:  [id number]  LOCATION:  [facility]                          FACILITY:  [facility]  PHYSICIAN:  Daleen Bo. Gaetano Net, M.D. DATE OF BIRTH:  Apr 08, 1971  DATE OF PROCEDURE:  08/22/2015 DATE OF DISCHARGE:                              OPERATIVE REPORT   PREOPERATIVE DIAGNOSIS:  Uterine leiomyomata.  POSTOPERATIVE DIAGNOSIS:  Uterine leiomyomata.  PROCEDURE:  Total abdominal hysterectomy with bilateral salpingectomy.  SURGEON:  Daleen Bo. Gaetano Net, MD.  ASSISTANTLinda Hedges, DO.  ANESTHESIA:  General with endotracheal intubation.  ESTIMATED BLOOD LOSS:  400 mL.  SPECIMEN:  Uterus, bilateral fallopian tubes to Pathology.  DRAINS:  Foley catheter.  INDICATIONS AND CONSENT:  This patient is a 45 year old African American female with uterine leiomyomata when heavy menses and pelvic pressure. Total abdominal hysterectomy, removal of both fallopian tubes, and removal of the ovary only if distinctly abnormal have been discussed preoperatively.  Potential risks and complications have been reviewed preoperatively including, but not limited to, infection, organ damage, bleeding requiring transfusion of blood products with HIV and hepatitis acquisition, DVT, PE, pneumonia, fistula formation, pelvic pain, abdominal pain, painful intercourse, return to the operating room.  All questions have been answered and consent is signed on the chart.  FINDINGS:  The uterus is approximately 20 weeks in size with multiple leiomyomata.  There are some adhesions of the vesicouterine peritoneum on the anterior lower uterine segment.  DESCRIPTION OF PROCEDURE:  The patient was taken to the operating room, where she was identified, placed in dorsal supine position and general anesthesia was induced via endotracheal intubation.  She was then prepped vaginally, Foley catheter was placed and prepped abdominally in a sterile fashion.  She was  then draped in a sterile fashion.  Time-out was undertaken.  Skin was entered through a Pfannenstiel incision by taking out the old Pfannenstiel scar on the way in.  Dissection was carried out in layers to the peritoneum which was entered and extended superiorly and inferiorly.  The uterus was then delivered through the incision.  This allowed the adhesions to the lower anterior uterus to be taken down sharply without difficulty.  Using the LigaSure handheld bipolar cautery, the proximal ligaments on the right adnexa were taken down.  The superior branch of the uterine artery was also taken. Similar procedure was then carried out on the left side.  One or two additional bites below the internal cervical os level were taken on each side as well.  The vesicouterine peritoneum was then taken down sharply and advanced bluntly well out of the way.  Then, using the malleable as a backstop, the fundus was resected sharply at the level of the internal cervical os.  After further advancing bladder straight and curved clamps were used with 0 Monocryl pops for ligatures to come down on the uterosacral ligaments as well as the uterine vessels and cardinal ligaments bilaterally.  This allows entry into the vagina.  The cervix was then sharply resected.  Inspection revealed the entire cervix has been removed.  The vaginal cuff was then closed with figure-of-eights. The angle sutures were tied in the midline.  Inspection revealed good hemostasis.  The fallopian tubes were then  removed as well under good visualization with the LigaSure bipolar cautery.  Good hemostasis was maintained and the ovaries appeared normal.  Copious lavage was carried out and all returned as clear.  Reinspection reveals good hemostasis at all sites.  The anterior peritoneum was then closed in a running fashion with 0 Monocryl suture which was also used to reapproximate the pyramidalis muscle in midline.  Anterior rectus fascia  was closed with a 0 looped PDS suture starting at each angle and meeting in the middle. Exparel 20 mL diluted in 20 mL of saline was then injected both subfascially and subcutaneously.  Plain suture was used in interrupted fashion to close the subcutaneous space and the skin was closed with a subcuticular 4-0 Vicryl on a Keith needle.  Benzoin and Steri-Strips and a pressure dressing were applied.  All counts were correct.  The patient was awakened, taken to the recovery room in stable condition.     Daleen Bo Gaetano Net, M.D.     JET/MEDQ  D:  08/22/2015  T:  08/22/2015  Job:  [id number]

## 2015-08-22 NOTE — Anesthesia Postprocedure Evaluation (Signed)
Anesthesia Post Note  Patient: Paula Berry  Procedure(s) Performed: Procedure(s) (LRB): HYSTERECTOMY ABDOMINAL, BILATERAL SALPINGECTOMY (Bilateral)  Patient location during evaluation: PACU Anesthesia Type: General Level of consciousness: awake Pain management: pain level controlled Vital Signs Assessment: post-procedure vital signs reviewed and stable Respiratory status: spontaneous breathing Cardiovascular status: stable Postop Assessment: no signs of nausea or vomiting Anesthetic complications: no    Last Vitals:  Filed Vitals:   08/22/15 0930 08/22/15 0945  BP: 119/85 127/75  Pulse: 67 63  Temp:    Resp: 12 13    Last Pain:  Filed Vitals:   08/22/15 0951  PainSc: [name]

## 2015-08-22 NOTE — Progress Notes (Signed)
No changes to H&P per patient history Reviewed with patient procedure-TAH/BS, removal of ovary if abnormal All questions answered Patient states she understands and agrees

## 2015-08-22 NOTE — Transfer of Care (Signed)
Immediate Anesthesia Transfer of Care Note  Patient: Paula Berry  Procedure(s) Performed: Procedure(s): HYSTERECTOMY ABDOMINAL, BILATERAL SALPINGECTOMY (Bilateral)  Patient Location: PACU  Anesthesia Type:General  Level of Consciousness: sedated  Airway & Oxygen Therapy: Patient Spontanous Breathing and Patient connected to nasal cannula oxygen  Post-op Assessment: Report given to RN  Post vital signs: Reviewed  Last Vitals:  Filed Vitals:   08/22/15 0625  BP: 114/76  Pulse: 67  Temp: 36.7 C  Resp: 20    Complications: No apparent anesthesia complications

## 2015-08-22 NOTE — Addendum Note (Signed)
Addendum  created 08/22/15 1357 by Riki Sheer, CRNA   Modules edited: Clinical Notes   Clinical Notes:  File: [id number]

## 2015-08-22 NOTE — Brief Op Note (Signed)
08/22/2015  9:10 AM  PATIENT:  Paula Berry  45 y.o. female  PRE-OPERATIVE DIAGNOSIS:  FIBROIDS  POST-OPERATIVE DIAGNOSIS:  FIBROIDS  PROCEDURE:  Procedure(s): HYSTERECTOMY ABDOMINAL, BILATERAL SALPINGECTOMY (Bilateral)  SURGEON:  Surgeon(s) and Role:    * Everlene Farrier, MD - Primary    * Linda Hedges, DO - Assisting  PHYSICIAN ASSISTANT:   ASSISTANTS: Megan Morris DO   ANESTHESIA:   general  EBL:  Total I/O In: 2100 [I.V.:2100] Out: 650 [Urine:250; Blood:400]  BLOOD ADMINISTERED:none  DRAINS: Urinary Catheter (Foley)   LOCAL MEDICATIONS USED:  Amount: 20cc in 20cc saline ml and OTHER Exparel  SPECIMEN:  Source of Specimen:  uterus, bilateral tubes  DISPOSITION OF SPECIMEN:  PATHOLOGY  COUNTS:  YES  TOURNIQUET:  * No tourniquets in log *  DICTATION: .Other Dictation: Dictation Number [id number]  PLAN OF CARE: Admit to inpatient   PATIENT DISPOSITION:  PACU - hemodynamically stable.   Delay start of Pharmacological VTE agent (>24hrs) due to surgical blood loss or risk of bleeding: not applicable

## 2015-08-23 LAB — CBC
HCT: 32.3 % — ABNORMAL LOW (ref 36.0–46.0)
Hemoglobin: 10 g/dL — ABNORMAL LOW (ref 12.0–15.0)
MCH: 26.2 pg (ref 26.0–34.0)
MCHC: 31 g/dL (ref 30.0–36.0)
MCV: 84.6 fL (ref 78.0–100.0)
PLATELETS: 224 10*3/uL (ref 150–400)
RBC: 3.82 MIL/uL — ABNORMAL LOW (ref 3.87–5.11)
RDW: 20.2 % — AB (ref 11.5–15.5)
WBC: 9.2 10*3/uL (ref 4.0–10.5)

## 2015-08-23 MED ORDER — PROMETHAZINE HCL 25 MG PO TABS: 25.0000 mg | ORAL_TABLET | Freq: Every day | ORAL | Status: DC | PRN

## 2015-08-23 MED ORDER — ASPIRIN-ACETAMINOPHEN-CAFFEINE 250-250-65 MG PO TABS: 2.0000 | ORAL_TABLET | Freq: Every day | ORAL | Status: DC | PRN

## 2015-08-23 NOTE — Plan of Care (Signed)
Problem: Skin Integrity: Goal: Demonstration of wound healing without infection will improve Outcome: Completed/Met Date Met:  08/23/15 Discussed signs and symptoms of wound infection.

## 2015-08-23 NOTE — Plan of Care (Signed)
Problem: Urinary Elimination: Goal: Ability to reestablish a normal urinary elimination pattern will improve Outcome: Completed/Met Date Met:  08/23/15 Voids qs without difficulty.

## 2015-08-23 NOTE — Progress Notes (Signed)
POD #1   Ambulating, tolerating regular diet, voiding, + flatus May be getting migraine HA-her neurologist directs Excedrin Migraine and phenergan for migraine Had itching with Dilaudid  Filed Vitals:   08/23/15 0515 08/23/15 0839  BP: 114/57   Pulse: 72   Temp: 98.2 F (36.8 C)   Resp: 12 17   Lungs CTA Cor RRR Abd soft, dressing dry, good BS  Results for orders placed or performed during the hospital encounter of 08/22/15 (from the past 24 hour(s))  CBC     Status: Abnormal   Collection Time: 08/23/15  5:42 AM  Result Value Ref Range   WBC 9.2 4.0 - 10.5 K/uL   RBC 3.82 (L) 3.87 - 5.11 MIL/uL   Hemoglobin 10.0 (L) 12.0 - 15.0 g/dL   HCT 32.3 (L) 36.0 - 46.0 %   MCV 84.6 78.0 - 100.0 fL   MCH 26.2 26.0 - 34.0 pg   MCHC 31.0 30.0 - 36.0 g/dL   RDW 20.2 (H) 11.5 - 15.5 %   Platelets 224 150 - 400 K/uL    A/P: Satisfactory         Excedrin migraine and phenergan prn HA-D/W patient need to space out ibuprofen and ASA dosing         Percocet prn-will observe for itching

## 2015-08-24 ENCOUNTER — Encounter (HOSPITAL_COMMUNITY): Payer: Self-pay | Admitting: Obstetrics and Gynecology

## 2015-08-24 MED ORDER — OXYCODONE-ACETAMINOPHEN 5-325 MG PO TABS
1.0000 | ORAL_TABLET | Freq: Four times a day (QID) | ORAL | Status: DC | PRN
Start: 2015-08-24 — End: 2018-03-25

## 2015-08-24 MED ORDER — IBUPROFEN 600 MG PO TABS: 600.0000 mg | ORAL_TABLET | Freq: Four times a day (QID) | ORAL | Status: DC | PRN

## 2015-08-24 NOTE — Discharge Instructions (Signed)
No vaginal entry No lifting No operation of automobiles

## 2015-08-24 NOTE — Progress Notes (Signed)
Home medication "Excedrin Migraine" 2 tablets returned to patient prior to discharge.

## 2015-08-24 NOTE — Progress Notes (Signed)
Discharge instructions reviewed with patient.  Patient states understanding of home care, medications, activity, signs/symptoms to report to MD and return MD office visit.  Patients significant other and family will assist with her care @ home.  No home  equipment needed, patient has prescriptions and all personal belongings.  Patient discharged per wheelchair in stable condition with staff without incident.  

## 2015-08-24 NOTE — Discharge Summary (Signed)
Physician Discharge Summary  Patient ID: ROSEMERY GOINES MRN: [id number] DOB/AGE: 44-Aug-1972 44 y.o.  Admit date: 08/22/2015 Discharge date: 08/24/2015  Admission Diagnoses:  Discharge Diagnoses:  Active Problems:   Fibroids   Discharged Condition: good  Hospital Course: admitted from [facility]. Had good resumption of bowel and bladder function. Ambulating and tolerating regular diet with good pain relief.  Consults: None  Significant Diagnostic Studies: labs:  Results for orders placed or performed during the hospital encounter of 08/22/15 (from the past 72 hour(s))  hCG, serum, qualitative     Status: None   Collection Time: 08/22/15  6:00 AM  Result Value Ref Range   Preg, Serum NEGATIVE NEGATIVE    Comment:        THE SENSITIVITY OF THIS METHODOLOGY IS >10 mIU/mL.   CBC     Status: Abnormal   Collection Time: 08/23/15  5:42 AM  Result Value Ref Range   WBC 9.2 4.0 - 10.5 K/uL   RBC 3.82 (L) 3.87 - 5.11 MIL/uL   Hemoglobin 10.0 (L) 12.0 - 15.0 g/dL   HCT 32.3 (L) 36.0 - 46.0 %   MCV 84.6 78.0 - 100.0 fL   MCH 26.2 26.0 - 34.0 pg   MCHC 31.0 30.0 - 36.0 g/dL   RDW 20.2 (H) 11.5 - 15.5 %   Platelets 224 150 - 400 K/uL    Treatments: surgery: TAH/bilaterla salpingectomy  Discharge Exam: Blood pressure 108/60, pulse 63, temperature 98.9 F (37.2 C), temperature source Oral, resp. rate 16, height 5\' 9"  (1.753 m), weight 212 lb (96.163 kg), SpO2 97 %. General appearance: alert, cooperative and no distress GI: soft, non-tender; bowel sounds normal; no masses,  no organomegaly  Disposition:      Medication List    TAKE these medications        albuterol 108 (90 Base) MCG/ACT inhaler  Commonly known as:  PROVENTIL HFA;VENTOLIN HFA  Inhale 1-2 puffs into the lungs every 6 (six) hours as needed for wheezing or shortness of breath.     aspirin-acetaminophen-caffeine [id number] MG tablet  Commonly known as:  EXCEDRIN MIGRAINE  Take 2 tablets by mouth daily as  needed for headache or migraine.     ibuprofen 600 MG tablet  Commonly known as:  ADVIL,MOTRIN  Take 1 tablet (600 mg total) by mouth every 6 (six) hours as needed (mild pain).     oxyCODONE-acetaminophen 5-325 MG tablet  Commonly known as:  PERCOCET/ROXICET  Take 1-2 tablets by mouth every 6 (six) hours as needed (moderate to severe pain (when tolerating fluids)).     pantoprazole 20 MG tablet  Commonly known as:  PROTONIX  Take 20 mg by mouth daily as needed for heartburn.     valACYclovir 500 MG tablet  Commonly known as:  VALTREX  Take 500 mg by mouth 3 (three) times daily as needed (for outbreaks).           Follow-up Information    Follow up In 2 weeks.      Signed: Benjiman Core E 08/24/2015, 9:46 AM

## 2015-08-24 NOTE — Progress Notes (Signed)
POD #2 Ambulating, + flatus, tolerating regular diet, voiding, good pain relief  VSS Afeb  Abdomen soft, incision healing well  A/P: D/C home

## 2015-11-13 ENCOUNTER — Ambulatory Visit: Payer: BC Managed Care – PPO | Admitting: Neurology

## 2015-11-20 ENCOUNTER — Encounter: Payer: Self-pay | Admitting: Neurology

## 2015-11-20 ENCOUNTER — Ambulatory Visit (INDEPENDENT_AMBULATORY_CARE_PROVIDER_SITE_OTHER): Payer: BC Managed Care – PPO | Admitting: Neurology

## 2015-11-20 VITALS — BP 123/85 | HR 80 | Ht 69.0 in | Wt 219.2 lb

## 2015-11-20 DIAGNOSIS — G43009 Migraine without aura, not intractable, without status migrainosus: Secondary | ICD-10-CM

## 2015-11-20 MED ORDER — ONDANSETRON 4 MG PO TBDP: ORAL_TABLET | ORAL | Status: DC

## 2015-11-20 NOTE — Progress Notes (Addendum)
[facility]    Provider:  Dr Jaynee Eagles Referring Provider: Everlene Farrier, MD Primary Care Physician:  Everlene Farrier MD  CC:  Headache  HPI:  Paula Berry is a 45 y.o. female here as a referral from Dr. Gaetano Net for headache. PMHx TAH/bilateral salpingectomy, migraines.  She has had migraines since 1993. No inciting events. She was in college, they were debilitating. She has been on "everything under the sun".  Was seeing a neurologist at [facility] Dr. Marijean Bravo. They will last 3-5 days. Within the last 3 years she was getting them 1x every 3-4 months. 8 months ago they started worsening and debilitating again. 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 In February she saw her obgyn and she had to have a TAH/bilateral salpingectomy. Now she gets them 2x a month and they last 2-3 days and they can be severe. Her husband has cancer which is stressful. She has tried to push through. They are unilateral, pouding, throbbing, they can travel around and spread, worse in the temples, she takes excedrin migraine and phenergan, +light/sound/smell sensitivity withy nausea and has vomited in the past. Dark quiet rooms help, excedrin might help but worse if she wakes up with the headache. She takes excedrin migraine only once with headache, no medication overuse. She has 15 headache days a month at least including all headaches. Stress, pineapple, dehydration, pizza sauce triggers it. The triptans never helped the headaches and made her feel bad. Rarely she has an aura, images in her vision but not all the time none in the last year.   Imitrex, zomig, maxalt, relpax not frova. She has tried topamax, phenergan and tylenol3. Fioricet. and other preventatives she can't remember. Tried advil and alleve for months and never worked. She gets extreme nausea during episodes and difficult to swallow pills, beter with liquids.   Reviewed notes, labs and imaging from outside  physicians, which showed:  CBC with anemia 11.1/35.4 08/12/2015 otherwise unremarkable, CMP essentially normal  Review of Systems: Patient complains of symptoms per HPI as well as the following symptoms: constipation. Pertinent negatives per HPI. All others negative.   Social History   Social History  . Marital Status: Married    Spouse Name: Edd Arbour  . Number of Children: 0  . Years of Education: 20   Occupational History  . [place] History Main Topics  . Smoking status: Never Smoker   . Smokeless tobacco: Never Used  . Alcohol Use: 0.6 oz/week    1 Glasses of wine per week     Comment: daily  . Drug Use: No  . Sexual Activity: Not on file   Other Topics Concern  . Not on file   Social History Narrative   Lives with spouse   Caffeine use: 1 cup coffee/day (5 days per week)   No soda often, unless she has a headache (2x/month)   Tea- rarely     Family History  Problem Relation Age of Onset  . Hypertension    . Diabetes    . Cancer    . Dementia Maternal Grandfather   . Migraines Neg Hx     Past Medical History  Diagnosis Date  . Asthma   . Pneumonia     hospitalizied for pneumonia as a child  . GERD (gastroesophageal reflux disease)   . Headache     migraines 2-3 times monthly  . Anemia     had iron infusion 08/12/15  Past Surgical History  Procedure Laterality Date  . Myomectomy abdominal approach    . Abdominal hysterectomy Bilateral 08/22/2015    Procedure: HYSTERECTOMY ABDOMINAL, BILATERAL SALPINGECTOMY;  Surgeon: Everlene Farrier, MD;  Location: [facility];  Service: Gynecology;  Laterality: Bilateral;    Current Outpatient Prescriptions  Medication Sig Dispense Refill  . albuterol (PROVENTIL HFA;VENTOLIN HFA) 108 (90 Base) MCG/ACT inhaler Inhale 1-2 puffs into the lungs every 6 (six) hours as needed for wheezing or shortness of breath.    Marland Kitchen aspirin-acetaminophen-caffeine (EXCEDRIN MIGRAINE) 250-250-65 MG tablet Take 2 tablets  by mouth daily as needed for headache or migraine.    Marland Kitchen ibuprofen (ADVIL,MOTRIN) 600 MG tablet Take 1 tablet (600 mg total) by mouth every 6 (six) hours as needed (mild pain). (Patient taking differently: Take 600 mg by mouth every 6 (six) hours as needed (mild pain). Only takes when she has a headache about 2x/month (11/20/15)) 30 tablet 0  . pantoprazole (PROTONIX) 20 MG tablet Take 20 mg by mouth daily as needed for heartburn.    . valACYclovir (VALTREX) 500 MG tablet Take 500 mg by mouth 3 (three) times daily as needed (for outbreaks).    . ondansetron (ZOFRAN-ODT) 4 MG disintegrating tablet Take 1-2 tablet (4-8mg  total) by mouth every 8 (eight) hours as needed for nausea. 30 tablet 12  . oxyCODONE-acetaminophen (PERCOCET/ROXICET) 5-325 MG tablet Take 1-2 tablets by mouth every 6 (six) hours as needed (moderate to severe pain (when tolerating fluids)). (Patient not taking: Reported on 11/20/2015) 30 tablet 0   No current facility-administered medications for this visit.    Allergies as of 11/20/2015 - Review Complete 11/20/2015  Allergen Reaction Noted  . Neosporin [neomycin-bacitracin zn-polymyx]  08/06/2015    Vitals: BP 123/85 mmHg  Pulse 80  Ht 5\' 9"  (1.753 m)  Wt 219 lb 3.2 oz (99.428 kg)  BMI 32.36 kg/m2 Last Weight:  Wt Readings from Last 1 Encounters:  11/20/15 219 lb 3.2 oz (99.428 kg)   Last Height:   Ht Readings from Last 1 Encounters:  11/20/15 5\' 9"  (1.753 m)    Physical exam: Exam: Gen: NAD, conversant, well nourised, obese, well groomed                     CV: RRR, no MRG. No Carotid Bruits. No peripheral edema, warm, nontender Eyes: Conjunctivae clear without exudates or hemorrhage  Neuro: Detailed Neurologic Exam  Speech:    Speech is normal; fluent and spontaneous with normal comprehension.  Cognition:    The patient is oriented to person, place, and time;     recent and remote memory intact;     language fluent;     normal attention, concentration,      fund of knowledge Cranial Nerves:    The pupils are equal, round, and reactive to light. The fundi are normal and spontaneous venous pulsations are present. Visual fields are full to finger confrontation. Extraocular movements are intact. Trigeminal sensation is intact and the muscles of mastication are normal. The face is symmetric. The palate elevates in the midline. Hearing intact. Voice is normal. Shoulder shrug is normal. The tongue has normal motion without fasciculations.   Coordination:    Normal finger to nose and heel to shin. Normal rapid alternating movements.   Gait:    Heel-toe and tandem gait are normal.   Motor Observation:    No asymmetry, no atrophy, and no involuntary movements noted. Tone:    Normal muscle tone.    Posture:  Posture is normal. normal erect    Strength:    Strength is V/V in the upper and lower limbs.      Sensation: intact to LT     Reflex Exam:  DTR's:    Deep tendon reflexes in the upper and lower extremities are normal bilaterally.   Toes:    The toes are downgoing bilaterally.   Clonus:    Clonus is absent.      Assessment/Plan:  45 year old with migraines. Discussion with patient regarding migraine management, preventative medication and acute management. Discussed starting a preventative medication and given her burden of headaches do recommend preventative therapy,  however at this point we'll just try to treat acutely per patient's preference. Neurologic exam is normal.  As far as your medications are concerned, I would like to suggest:   At onset try the [facility]. Can repeat in 1 hour. Discussed side effects as detailed in the patient instructions. [place] at onset of headache may repeat in 2 hours. Take with food, stop for GI upset or dark stools. Discussed side effects as detailed in the patient instructions. Take the above with Zofran. Discussed side effects as detailed in the patient instructions.  Sarina Ill,  MD  [facility] [address] [place], [place] [address]  Phone [phone] Fax [phone]  Cc: Dr. Gertie Fey   A total of 45 minutes was spent face-to-face with this patient. Over half this time was spent on counseling patient on the migraine diagnosis and different diagnostic and therapeutic options available.

## 2015-11-20 NOTE — Patient Instructions (Addendum)
Remember to drink plenty of fluid, eat healthy meals and do not skip any meals. Try to eat protein with a every meal and eat a healthy snack such as fruit or nuts in between meals. Try to keep a regular sleep-wake schedule and try to exercise daily, particularly in the form of walking, 20-30 minutes a day, if you can.   As far as your medications are concerned, I would like to suggest:   At onset try the [facility]. Can repeat in 1 hour. Cambia at onset of headache may repeat in 2 hours Take the above with Zofran.   As far as diagnostic testing: None  Our phone number is [phone]. We also have an after hours call service for urgent matters and there is a physician on-call for urgent questions. For any emergencies you know to call 911 or go to the nearest emergency room Diclofenac powder for oral solution What is this medicine? DICLOFENAC (dye KLOE fen ak) is a non-steroidal anti-inflammatory drug (NSAID). It is used to treat migraine pain. This medicine may be used for other purposes; ask your health care provider or pharmacist if you have questions. What should I tell my health care provider before I take this medicine? They need to know if you have any of these conditions: -asthma, especially aspirin sensitive asthma -coronary artery bypass graft (CABG) surgery within the past 2 weeks -drink more than 3 alcohol-containing drinks a day -heart disease or circulation problems like heart failure or leg edema (fluid retention) -high blood pressure -kidney disease -liver disease -phenylketonuria -stomach problems -an unusual or allergic reaction to diclofenac, aspirin, other NSAIDs, other medicines, foods, dyes, or preservatives -pregnant or trying to get pregnant -breast-feeding How should I use this medicine? Mix this medicine with 1 to 2 ounces of water. Drink the medicine and water together. Follow the directions on the prescription label. Do not take your medicine more often than  directed. Long-term, continuous use may increase the risk of heart attack or stroke. A special MedGuide will be given to you by the pharmacist with each prescription and refill. Be sure to read this information carefully each time. Talk to your pediatrician regarding the use of this medicine in children. Special care may be needed. Elderly patients over 42 years old may have a stronger reaction and need a smaller dose. Overdosage: If you think you have taken too much of this medicine contact a poison control center or emergency room at once. NOTE: This medicine is only for you. Do not share this medicine with others. What if I miss a dose? This does not apply. What may interact with this medicine? Do not take this medicine with any of the following medications: -cidofovir -ketorolac -methotrexate This medicine may also interact with the following medications: -alcohol -aspirin and aspirin-like medicines -cyclosporine -diuretics -lithium -medicines for blood pressure -medicines for osteoporosis -medicines that affect platelets -medicines that treat or prevent blood clots like warfarin -NSAIDs, medicines for pain and inflammation, like ibuprofen or naproxen -pemetrexed -steroid medicines like prednisone or cortisone This list may not describe all possible interactions. Give your health care provider a list of all the medicines, herbs, non-prescription drugs, or dietary supplements you use. Also tell them if you smoke, drink alcohol, or use illegal drugs. Some items may interact with your medicine. What should I watch for while using this medicine? Tell your doctor or health care professional if your pain does not get better. Talk to your doctor before taking another medicine for pain. Do  not treat yourself. This medicine does not prevent heart attack or stroke. In fact, this medicine may increase the chance of a heart attack or stroke. The chance may increase with longer use of this  medicine and in people who have heart disease. If you take aspirin to prevent heart attack or stroke, talk with your doctor or health care professional. Do not take medicines such as ibuprofen and naproxen with this medicine. Side effects such as stomach upset, nausea, or ulcers may be more likely to occur. Many medicines available without a prescription should not be taken with this medicine. This medicine can cause ulcers and bleeding in the stomach and intestines at any time during treatment. Do not smoke cigarettes or drink alcohol. These increase irritation to your stomach and can make it more susceptible to damage from this medicine. Ulcers and bleeding can happen without warning symptoms and can cause death. You may get drowsy or dizzy. Do not drive, use machinery, or do anything that needs mental alertness until you know how this medicine affects you. Do not stand or sit up quickly, especially if you are an older patient. This reduces the risk of dizzy or fainting spells. This medicine can cause you to bleed more easily. Try to avoid damage to your teeth and gums when you brush or floss your teeth. If you take migraine medicines for 10 or more days a month, your migraines may get worse. Keep a diary of headache days and medicine use. Contact your healthcare professional if your migraine attacks occur more frequently. What side effects may I notice from receiving this medicine? Side effects that you should report to your doctor or health care professional as soon as possible: -allergic reactions like skin rash, itching or hives, swelling of the face, lips, or tongue -black or bloody stools, blood in the urine or vomit -blurred vision -chest pain -difficulty breathing or wheezing -nausea or vomiting -fever -redness, blistering, peeling or loosening of the skin, including inside the mouth -slurred speech or weakness on one side of the body -trouble passing urine or change in the amount of  urine -unexplained weight gain or swelling -unusually weak or tired -yellowing of eyes or skin Side effects that usually do not require medical attention (Report these to your doctor or health care professional if they continue or are bothersome.): -constipation -diarrhea -dizziness -headache -heartburn This list may not describe all possible side effects. Call your doctor for medical advice about side effects. You may report side effects to FDA at 1-800-FDA-1088. Where should I keep my medicine? Keep out of the reach of children. Store at room temperature between 15 and 30 degrees C (59 and 86 degrees F). Throw away any unused medicine after the expiration date. NOTE: This sheet is a summary. It may not cover all possible information. If you have questions about this medicine, talk to your doctor, pharmacist, or health care provider.    2016, Elsevier/Gold Standard. (2013-01-17 10:55:34)  Ondansetron oral dissolving tablet What is this medicine? ONDANSETRON (on DAN se tron) is used to treat nausea and vomiting caused by chemotherapy. It is also used to prevent or treat nausea and vomiting after surgery. This medicine may be used for other purposes; ask your health care provider or pharmacist if you have questions. What should I tell my health care provider before I take this medicine? They need to know if you have any of these conditions: -heart disease -history of irregular heartbeat -liver disease -low levels of magnesium or  potassium in the blood -an unusual or allergic reaction to ondansetron, granisetron, other medicines, foods, dyes, or preservatives -pregnant or trying to get pregnant -breast-feeding How should I use this medicine? These tablets are made to dissolve in the mouth. Do not try to push the tablet through the foil backing. With dry hands, peel away the foil backing and gently remove the tablet. Place the tablet in the mouth and allow it to dissolve, then swallow.  While you may take these tablets with water, it is not necessary to do so. Talk to your pediatrician regarding the use of this medicine in children. Special care may be needed. Overdosage: If you think you have taken too much of this medicine contact a poison control center or emergency room at once. NOTE: This medicine is only for you. Do not share this medicine with others. What if I miss a dose? If you miss a dose, take it as soon as you can. If it is almost time for your next dose, take only that dose. Do not take double or extra doses. What may interact with this medicine? Do not take this medicine with any of the following medications: -apomorphine -certain medicines for fungal infections like fluconazole, itraconazole, ketoconazole, posaconazole, voriconazole -cisapride -dofetilide -dronedarone -pimozide -thioridazine -ziprasidone This medicine may also interact with the following medications: -carbamazepine -certain medicines for depression, anxiety, or psychotic disturbances -fentanyl -linezolid -MAOIs like Carbex, Eldepryl, Marplan, Nardil, and Parnate -methylene blue (injected into a vein) -other medicines that prolong the QT interval (cause an abnormal heart rhythm) -phenytoin -rifampicin -tramadol This list may not describe all possible interactions. Give your health care provider a list of all the medicines, herbs, non-prescription drugs, or dietary supplements you use. Also tell them if you smoke, drink alcohol, or use illegal drugs. Some items may interact with your medicine. What should I watch for while using this medicine? Check with your doctor or health care professional as soon as you can if you have any sign of an allergic reaction. What side effects may I notice from receiving this medicine? Side effects that you should report to your doctor or health care professional as soon as possible: -allergic reactions like skin rash, itching or hives, swelling of the  face, lips, or tongue -breathing problems -confusion -dizziness -fast or irregular heartbeat -feeling faint or lightheaded, falls -fever and chills -loss of balance or coordination -seizures -sweating -swelling of the hands and feet -tightness in the chest -tremors -unusually weak or tired Side effects that usually do not require medical attention (report to your doctor or health care professional if they continue or are bothersome): -constipation or diarrhea -headache This list may not describe all possible side effects. Call your doctor for medical advice about side effects. You may report side effects to FDA at 1-800-FDA-1088. Where should I keep my medicine? Keep out of the reach of children. Store between 2 and 30 degrees C (36 and 86 degrees F). Throw away any unused medicine after the expiration date. NOTE: This sheet is a summary. It may not cover all possible information. If you have questions about this medicine, talk to your doctor, pharmacist, or health care provider.    2016, Elsevier/Gold Standard. (2013-02-22 16:21:52)  Sumatriptan injection What is this medicine? SUMATRIPTAN (soo ma TRIP tan) is used to treat migraines with or without aura. An aura is a strange feeling or visual disturbance that warns you of an attack. It is not used to prevent migraines. This medicine may be used for  other purposes; ask your health care provider or pharmacist if you have questions. What should I tell my health care provider before I take this medicine? They need to know if you have any of these conditions: -circulation problems in fingers and toes -diabetes -heart disease -high blood pressure -high cholesterol -history of irregular heartbeat -history of stroke -kidney disease -liver disease -postmenopausal or surgical removal of uterus and ovaries -seizures -smoke tobacco -stomach or intestine problems -an unusual or allergic reaction to sumatriptan, other medicines,  foods, dyes, or preservatives -pregnant or trying to get pregnant -breast-feeding How should I use this medicine? This medicine is for injection under the skin. Follow the directions on the prescription label. Only use this medicine at the first symptoms of a migraine. It is not for everyday use. If you are using an autoinjector, read the instruction leaflet carefully. A single injection is given just under the skin. Before you make an injection, clean and examine your skin. Do not inject at a place where the skin is damaged or infected. If your symptoms return you can use a second injection. If there is no improvement at all in your symptoms after the first injection, call your doctor or health care professional. Wait at least 1 hour between doses and do not use more than 6 mg as a single dose. Do not use more than 12 mg total in any 24 hour period. Do not use your medicine more often than directed. Talk to your pediatrician regarding the use of this medicine in children. Special care may be needed. Overdosage: If you think you have taken too much of this medicine contact a poison control center or emergency room at once. NOTE: This medicine is only for you. Do not share this medicine with others. What if I miss a dose? This does not apply; this medicine is not for regular use. What may interact with this medicine? Do not take this medicine with any of the following medicines: -cocaine -ergot alkaloids like dihydroergotamine, ergonovine, ergotamine, methylergonovine -feverfew -MAOIs like Carbex, Eldepryl, Marplan, Nardil, and Parnate -other medicines for migraine headache like almotriptan, eletriptan, frovatriptan, naratriptan, rizatriptan, zolmitriptan -tryptophan This medicine may also interact with the following medications: -certain medicines for depression, anxiety, or psychotic disturbances This list may not describe all possible interactions. Give your health care provider a list of all  the medicines, herbs, non-prescription drugs, or dietary supplements you use. Also tell them if you smoke, drink alcohol, or use illegal drugs. Some items may interact with your medicine. What should I watch for while using this medicine? Only take this medicine for a migraine headache. Take it if you get warning symptoms or at the start of a migraine attack. It is not for regular use to prevent migraine attacks. You may get drowsy or dizzy. Do not drive, use machinery, or do anything that needs mental alertness until you know how this medicine affects you. Do not stand or sit up quickly, especially if you are an older patient. This reduces the risk of dizzy or fainting spells. Alcohol may interfere with the effect of this medicine. Avoid alcoholic drinks. Smoking cigarettes may increase the risk of heart-related side effects from using this medicine. If you take migraine medicines for 10 or more days a month, your migraines may get worse. Keep a diary of headache days and medicine use. Contact your healthcare professional if your migraine attacks occur more frequently. What side effects may I notice from receiving this medicine? Side effects that you  should report to your doctor or health care professional as soon as possible: -allergic reactions like skin rash, itching or hives, swelling of the face, lips, or tongue -bloody or watery diarrhea -hallucination, loss of contact with reality -pain, tingling, numbness in the face, hands, or feet -seizures -signs and symptoms of a blood clot such as breathing problems; changes in vision; chest pain; severe, sudden headache; pain, swelling, warmth in the leg; trouble speaking; sudden numbness or weakness of the face, arm, or leg -signs and symptoms of a dangerous change in heartbeat or heart rhythm like chest pain; dizziness; fast or irregular heartbeat; palpitations, feeling faint or lightheaded; falls; breathing problems -signs and symptoms of a stroke  like changes in vision; confusion; trouble speaking or understanding; severe headaches; sudden numbness or weakness of the face, arm, or leg; trouble walking; dizziness; loss of balance or coordination -stomach pain Side effects that usually do not require medical attention (report these to your doctor or health care professional if they continue or are bothersome): -changes in taste -facial flushing -headache -muscle cramps -muscle pain -nausea, vomiting -pain, redness, or irritation at site where injected -weak or tired This list may not describe all possible side effects. Call your doctor for medical advice about side effects. You may report side effects to FDA at 1-800-FDA-1088. Where should I keep my medicine? Keep out of the reach of children. Store at room temperature between 2 and 30 degrees C (36 and 86 degrees F). Protect from light. Throw away any unused medicine after the expiration date. Make sure you receive a puncture-resistant container to dispose of the needles and syringes once you have finished with them. Do not reuse these items. Return the container to your health care professional for proper disposal. Keep the autoinjector device. NOTE: This sheet is a summary. It may not cover all possible information. If you have questions about this medicine, talk to your doctor, pharmacist, or health care provider.    2016, Elsevier/Gold Standard. (2014-08-13 13:35:11)

## 2015-11-21 ENCOUNTER — Encounter: Payer: Self-pay | Admitting: Neurology

## 2015-11-21 DIAGNOSIS — G43909 Migraine, unspecified, not intractable, without status migrainosus: Secondary | ICD-10-CM | POA: Insufficient documentation

## 2015-11-25 ENCOUNTER — Encounter: Payer: Self-pay | Admitting: *Deleted

## 2015-11-25 NOTE — Progress Notes (Signed)
Faxed cambia enrollment form to [place]. Fax: [phone]. Received fax confirmation.

## 2015-11-28 NOTE — Progress Notes (Signed)
Received update from [place] that a PA is required for cambia. It may take up to 72 hr to complete. They will send an update upon approval/denial.

## 2015-12-11 NOTE — Progress Notes (Signed)
Faxed completed PA request form back to [facility] with Dr Cathren Laine last office note. Fax: [phone]. Received confirmation.  Awaiting response.

## 2015-12-25 ENCOUNTER — Telehealth: Payer: Self-pay | Admitting: *Deleted

## 2015-12-25 NOTE — Telephone Encounter (Signed)
Spoke with Chrys Racer, [place]: PA for [employer]. She stated it is approved from 12/11/15 through 12/10/17, PA # [id number].

## 2018-03-01 DIAGNOSIS — C50919 Malignant neoplasm of unspecified site of unspecified female breast: Secondary | ICD-10-CM

## 2018-03-01 HISTORY — DX: Malignant neoplasm of unspecified site of unspecified female breast: C50.919

## 2018-03-07 ENCOUNTER — Other Ambulatory Visit: Payer: Self-pay | Admitting: Obstetrics and Gynecology

## 2018-03-07 DIAGNOSIS — N6002 Solitary cyst of left breast: Secondary | ICD-10-CM

## 2018-03-11 ENCOUNTER — Ambulatory Visit
Admission: RE | Admit: 2018-03-11 | Discharge: 2018-03-11 | Disposition: A | Discharge status: Home / Self Care | Payer: BC Managed Care – PPO | Source: Ambulatory Visit | Attending: Obstetrics and Gynecology | Admitting: Obstetrics and Gynecology

## 2018-03-11 ENCOUNTER — Other Ambulatory Visit: Payer: Self-pay | Admitting: Obstetrics and Gynecology

## 2018-03-11 DIAGNOSIS — N6320 Unspecified lump in the left breast, unspecified quadrant: Secondary | ICD-10-CM

## 2018-03-11 DIAGNOSIS — R2232 Localized swelling, mass and lump, left upper limb: Secondary | ICD-10-CM

## 2018-03-11 DIAGNOSIS — N6002 Solitary cyst of left breast: Secondary | ICD-10-CM

## 2018-03-15 ENCOUNTER — Other Ambulatory Visit: Payer: Self-pay | Admitting: Obstetrics and Gynecology

## 2018-03-15 DIAGNOSIS — R2231 Localized swelling, mass and lump, right upper limb: Secondary | ICD-10-CM

## 2018-03-16 ENCOUNTER — Ambulatory Visit
Admission: RE | Admit: 2018-03-16 | Discharge: 2018-03-16 | Disposition: A | Discharge status: Home / Self Care | Payer: BC Managed Care – PPO | Source: Ambulatory Visit | Attending: Obstetrics and Gynecology | Admitting: Obstetrics and Gynecology

## 2018-03-16 ENCOUNTER — Other Ambulatory Visit: Payer: Self-pay | Admitting: Obstetrics and Gynecology

## 2018-03-16 DIAGNOSIS — N6320 Unspecified lump in the left breast, unspecified quadrant: Secondary | ICD-10-CM

## 2018-03-16 DIAGNOSIS — N6310 Unspecified lump in the right breast, unspecified quadrant: Secondary | ICD-10-CM

## 2018-03-16 DIAGNOSIS — R2231 Localized swelling, mass and lump, right upper limb: Secondary | ICD-10-CM

## 2018-03-16 DIAGNOSIS — R2232 Localized swelling, mass and lump, left upper limb: Secondary | ICD-10-CM

## 2018-03-18 ENCOUNTER — Telehealth: Payer: Self-pay | Admitting: Oncology

## 2018-03-18 ENCOUNTER — Ambulatory Visit
Admission: RE | Admit: 2018-03-18 | Discharge: 2018-03-18 | Disposition: A | Discharge status: Home / Self Care | Payer: BC Managed Care – PPO | Source: Ambulatory Visit | Attending: Obstetrics and Gynecology | Admitting: Obstetrics and Gynecology

## 2018-03-18 ENCOUNTER — Other Ambulatory Visit: Payer: Self-pay | Admitting: Obstetrics and Gynecology

## 2018-03-18 DIAGNOSIS — N6310 Unspecified lump in the right breast, unspecified quadrant: Secondary | ICD-10-CM

## 2018-03-18 NOTE — Telephone Encounter (Signed)
Spoke with patient to confirm morning [facility] appointment for 10/23, packet will be emailed to patient

## 2018-03-21 ENCOUNTER — Encounter: Payer: Self-pay | Admitting: *Deleted

## 2018-03-21 DIAGNOSIS — Z171 Estrogen receptor negative status [ER-]: Secondary | ICD-10-CM | POA: Insufficient documentation

## 2018-03-21 DIAGNOSIS — C50412 Malignant neoplasm of upper-outer quadrant of left female breast: Secondary | ICD-10-CM | POA: Insufficient documentation

## 2018-03-23 ENCOUNTER — Encounter: Payer: Self-pay | Admitting: Physical Therapy

## 2018-03-23 ENCOUNTER — Encounter: Payer: Self-pay | Admitting: Radiation Oncology

## 2018-03-23 ENCOUNTER — Other Ambulatory Visit: Payer: Self-pay | Admitting: *Deleted

## 2018-03-23 ENCOUNTER — Ambulatory Visit: Payer: BC Managed Care – PPO | Attending: General Surgery | Admitting: Physical Therapy

## 2018-03-23 ENCOUNTER — Inpatient Hospital Stay: Payer: BC Managed Care – PPO

## 2018-03-23 ENCOUNTER — Other Ambulatory Visit: Payer: Self-pay

## 2018-03-23 ENCOUNTER — Inpatient Hospital Stay: Payer: BC Managed Care – PPO | Attending: Oncology | Admitting: Oncology

## 2018-03-23 ENCOUNTER — Telehealth: Payer: Self-pay | Admitting: Oncology

## 2018-03-23 ENCOUNTER — Encounter: Payer: Self-pay | Admitting: Oncology

## 2018-03-23 ENCOUNTER — Ambulatory Visit
Admission: RE | Admit: 2018-03-23 | Discharge: 2018-03-23 | Disposition: A | Discharge status: Home / Self Care | Payer: BC Managed Care – PPO | Source: Ambulatory Visit | Attending: Radiation Oncology | Admitting: Radiation Oncology

## 2018-03-23 ENCOUNTER — Other Ambulatory Visit: Payer: Self-pay | Admitting: General Surgery

## 2018-03-23 ENCOUNTER — Encounter: Payer: Self-pay | Admitting: *Deleted

## 2018-03-23 VITALS — BP 126/82 | HR 88 | Temp 98.5°F | Resp 20 | Ht 69.0 in | Wt 213.0 lb

## 2018-03-23 DIAGNOSIS — C50412 Malignant neoplasm of upper-outer quadrant of left female breast: Secondary | ICD-10-CM | POA: Diagnosis not present

## 2018-03-23 DIAGNOSIS — Z171 Estrogen receptor negative status [ER-]: Secondary | ICD-10-CM | POA: Insufficient documentation

## 2018-03-23 DIAGNOSIS — R293 Abnormal posture: Secondary | ICD-10-CM | POA: Diagnosis present

## 2018-03-23 DIAGNOSIS — Z809 Family history of malignant neoplasm, unspecified: Secondary | ICD-10-CM

## 2018-03-23 DIAGNOSIS — M25612 Stiffness of left shoulder, not elsewhere classified: Secondary | ICD-10-CM | POA: Insufficient documentation

## 2018-03-23 DIAGNOSIS — Z801 Family history of malignant neoplasm of trachea, bronchus and lung: Secondary | ICD-10-CM | POA: Insufficient documentation

## 2018-03-23 DIAGNOSIS — C773 Secondary and unspecified malignant neoplasm of axilla and upper limb lymph nodes: Secondary | ICD-10-CM | POA: Diagnosis not present

## 2018-03-23 DIAGNOSIS — M25512 Pain in left shoulder: Secondary | ICD-10-CM | POA: Insufficient documentation

## 2018-03-23 LAB — CBC WITH DIFFERENTIAL (CANCER CENTER ONLY)
ABS IMMATURE GRANULOCYTES: 0 10*3/uL (ref 0.00–0.07)
BASOS ABS: 0 10*3/uL (ref 0.0–0.1)
BASOS PCT: 1 %
EOS ABS: 0.1 10*3/uL (ref 0.0–0.5)
Eosinophils Relative: 1 %
HEMATOCRIT: 43.7 % (ref 36.0–46.0)
Hemoglobin: 14.3 g/dL (ref 12.0–15.0)
Immature Granulocytes: 0 %
LYMPHS ABS: 1.6 10*3/uL (ref 0.7–4.0)
Lymphocytes Relative: 30 %
MCH: 30.6 pg (ref 26.0–34.0)
MCHC: 32.7 g/dL (ref 30.0–36.0)
MCV: 93.4 fL (ref 80.0–100.0)
MONOS PCT: 12 %
Monocytes Absolute: 0.7 10*3/uL (ref 0.1–1.0)
NEUTROS ABS: 3 10*3/uL (ref 1.7–7.7)
NRBC: 0 % (ref 0.0–0.2)
Neutrophils Relative %: 56 %
PLATELETS: 354 10*3/uL (ref 150–400)
RBC: 4.68 MIL/uL (ref 3.87–5.11)
RDW: 11.4 % — AB (ref 11.5–15.5)
WBC Count: 5.3 10*3/uL (ref 4.0–10.5)

## 2018-03-23 LAB — CMP (CANCER CENTER ONLY)
ALBUMIN: 3.3 g/dL — AB (ref 3.5–5.0)
ALK PHOS: 70 U/L (ref 38–126)
ALT: 17 U/L (ref 0–44)
ANION GAP: 11 (ref 5–15)
AST: 15 U/L (ref 15–41)
BUN: 8 mg/dL (ref 6–20)
CALCIUM: 9.4 mg/dL (ref 8.9–10.3)
CO2: 25 mmol/L (ref 22–32)
Chloride: 103 mmol/L (ref 98–111)
Creatinine: 0.67 mg/dL (ref 0.44–1.00)
GFR, Estimated: >60 mL/min (ref >60)
GLUCOSE: 83 mg/dL (ref 70–99)
Potassium: 4.2 mmol/L (ref 3.5–5.1)
SODIUM: 139 mmol/L (ref 135–145)
Total Bilirubin: 1 mg/dL (ref 0.3–1.2)
Total Protein: 7.6 g/dL (ref 6.5–8.1)

## 2018-03-23 MED ORDER — PROCHLORPERAZINE MALEATE 10 MG PO TABS: 10.0000 mg | ORAL_TABLET | Freq: Four times a day (QID) | ORAL | 1 refills | Status: DC | PRN

## 2018-03-23 MED ORDER — LORAZEPAM 0.5 MG PO TABS: 0.5000 mg | ORAL_TABLET | Freq: Every evening | ORAL | 0 refills | Status: DC | PRN

## 2018-03-23 MED ORDER — LIDOCAINE-PRILOCAINE 2.5-2.5 % EX CREA: TOPICAL_CREAM | CUTANEOUS | 3 refills | Status: DC

## 2018-03-23 MED ORDER — DEXAMETHASONE 4 MG PO TABS: ORAL_TABLET | ORAL | 1 refills | Status: DC

## 2018-03-23 NOTE — Progress Notes (Signed)
START ON PATHWAY REGIMEN - Breast     A cycle is every 14 days (cycles 1-4):     Doxorubicin      Cyclophosphamide      Pegfilgrastim-xxxx    A cycle is every 21 days (cycles 5-8):     Paclitaxel      Carboplatin   **Always confirm dose/schedule in your pharmacy ordering system**  Patient Characteristics: Preoperative or Nonsurgical Candidate (Clinical Staging), Neoadjuvant Therapy followed by Surgery, Invasive Disease, Chemotherapy, HER2 Negative/Unknown/Equivocal, ER Negative/Unknown, Platinum Therapy Indicated Therapeutic Status: Preoperative or Nonsurgical Candidate (Clinical Staging) AJCC M Category: cM0 AJCC Grade: G3 Breast Surgical Plan: Neoadjuvant Therapy followed by Surgery ER Status: Negative (-) AJCC 8 Stage Grouping: IIIC HER2 Status: Negative (-) AJCC T Category: cT2 AJCC N Category: cN2 PR Status: Negative (-) Type of Therapy: Platinum Therapy Indicated Intent of Therapy: Curative Intent, Discussed with Patient

## 2018-03-23 NOTE — Telephone Encounter (Signed)
Scheduled appt per 10/23 sch message - pt to get an updated schedule at chemo education.

## 2018-03-23 NOTE — Progress Notes (Signed)
Nutrition Assessment  Reason for Assessment:  Pt seen in Breast Clinic  ASSESSMENT:   47 year old female with new diagnosis of breast cancer.  Past medical history reviewed  Patient reports normal appetite  Medications:  reviewed  Labs: reviewed  Anthropometrics:   Height: 69 inches Weight: 213 lb BMI: 31   NUTRITION DIAGNOSIS: Food and nutrition related knowledge deficit related to new diagnosis of breast cancer as evidenced by no prior need for nutrition related information.  INTERVENTION:   Discussed and provided packet of information regarding nutritional tips for breast cancer patients.  Questions answered.  Teachback method used.  Contact information provided and patient knows to contact me with questions/concerns.    MONITORING, EVALUATION, and GOAL: Pt will consume a healthy plant based diet to maintain lean body mass throughout treatment.   Paula Berry Paula Berry, Paula Berry, Paula Berry Registered Dietitian [phone] (pager)

## 2018-03-23 NOTE — Patient Instructions (Signed)

## 2018-03-23 NOTE — Progress Notes (Signed)
Radiation Oncology         [phone]) [phone] ________________________________  Initial outpatient Consultation  Name: Jane Birkel MRN: [id number]  Date: 03/23/2018  DOB: 1971-01-07  TG:GYIR, Edwyna Shell, MD  Rolm Bookbinder, MD   REFERRING PHYSICIAN: Rolm Bookbinder, MD  DIAGNOSIS:    ICD-10-CM   1. Malignant neoplasm of upper-outer quadrant of left breast in female, estrogen receptor negative ([place]) C50.412    Z17.1    Cancer Staging Malignant neoplasm of upper-outer quadrant of left breast in female, estrogen receptor negative ([place]) Staging form: Breast, AJCC 8th Edition - Clinical stage from 03/23/2018: Stage IIIB (cT2, cN1, cM0, G3, ER-, PR-, HER2-) - Unsigned  CHIEF COMPLAINT: Here to discuss management of left breast cancer  HISTORY OF PRESENT ILLNESS::Keyarah Lorene Shylee Durrett is a 47 y.o. female who presented with soreness in shoulder and left axillary swelling and tenderness.  The patient then underwent mammography showing breast distortion and enlarged axillary nodes (left).  US showed a 4.2cm ill defined  mass from 12-3:00 of the left breast and at least 6 abnormal axillary nodes.  Biopsy of the breast mass showed Grade 3 invasive ductal carcinoma, triple negative.  Biopsy of the left axilla showed metastatic carcinoma.  She also had two right breast biopsies for indeterminate lesions which ended up being benign.  She reports, symptomatically, that she has fatigue, left shoulder pain, sinus problems, chronic headaches (stable, for several years), joint pain.  She is a Management consultant. She has undergone a hysterectomy/BSO.  PREVIOUS RADIATION THERAPY: No  PAST MEDICAL HISTORY:  has a past medical history of Anemia, Asthma, Genital herpes, GERD (gastroesophageal reflux disease), Headache, and Pneumonia.    PAST SURGICAL HISTORY: Past Surgical History:  Procedure Laterality Date  . ABDOMINAL HYSTERECTOMY Bilateral 08/22/2015   Procedure: HYSTERECTOMY  ABDOMINAL, BILATERAL SALPINGECTOMY;  Surgeon: Everlene Farrier, MD;  Location: [facility];  Service: Gynecology;  Laterality: Bilateral;  . MYOMECTOMY ABDOMINAL APPROACH      FAMILY HISTORY: family history includes Brain cancer in her paternal aunt; Cancer in her unknown relative; Dementia in her maternal grandfather; Diabetes in her unknown relative; Hypertension in her unknown relative; Lung cancer in her paternal grandmother.  SOCIAL HISTORY:  reports that she has never smoked. She has never used smokeless tobacco. She reports that she drinks about 1.0 standard drinks of alcohol per week. She reports that she does not use drugs.  ALLERGIES: Neosporin [neomycin-bacitracin zn-polymyx]  MEDICATIONS:  Current Outpatient Medications  Medication Sig Dispense Refill  . albuterol (PROVENTIL HFA;VENTOLIN HFA) 108 (90 Base) MCG/ACT inhaler Inhale 1-2 puffs into the lungs every 6 (six) hours as needed for wheezing or shortness of breath.    Marland Kitchen aspirin-acetaminophen-caffeine (EXCEDRIN MIGRAINE) 250-250-65 MG tablet Take 2 tablets by mouth daily as needed for headache or migraine.    Marland Kitchen dexamethasone (DECADRON) 4 MG tablet Take 2 tablets by mouth once a day on the day after chemotherapy and then take 2 tablets two times a day for 2 days. Take with food. 30 tablet 1  . Diclofenac Potassium (CAMBIA) 50 MG PACK Take by mouth. 1 packet w/1-2 oz water prn headaches 12/25/15 [facility] - approved from 12/11/15 through 12/10/17, PA # [id number]    . ibuprofen (ADVIL,MOTRIN) 600 MG tablet Take 1 tablet (600 mg total) by mouth every 6 (six) hours as needed (mild pain). (Patient taking differently: Take 600 mg by mouth every 6 (six) hours as needed (mild pain). Only takes when she has a headache about 2x/month (11/20/15)) 30  tablet 0  . lidocaine-prilocaine (EMLA) cream Apply to affected area once 30 g 3  . LORazepam (ATIVAN) 0.5 MG tablet Take 1 tablet (0.5 mg total) by mouth at bedtime as needed (Nausea or vomiting). 30  tablet 0  . ondansetron (ZOFRAN-ODT) 4 MG disintegrating tablet Take 1-2 tablet (4-71m total) by mouth every 8 (eight) hours as needed for nausea. 30 tablet 12  . oxyCODONE-acetaminophen (PERCOCET/ROXICET) 5-325 MG tablet Take 1-2 tablets by mouth every 6 (six) hours as needed (moderate to severe pain (when tolerating fluids)). (Patient not taking: Reported on 11/20/2015) 30 tablet 0  . pantoprazole (PROTONIX) 20 MG tablet Take 20 mg by mouth daily as needed for heartburn.    . prochlorperazine (COMPAZINE) 10 MG tablet Take 1 tablet (10 mg total) by mouth every 6 (six) hours as needed (Nausea or vomiting). 30 tablet 1  . valACYclovir (VALTREX) 500 MG tablet Take 500 mg by mouth 3 (three) times daily as needed (for outbreaks).     No current facility-administered medications for this encounter.     REVIEW OF SYSTEMS: A 10+ POINT REVIEW OF SYSTEMS WAS OBTAINED including neurology, dermatology, psychiatry, cardiac, respiratory, lymph, extremities, GI, GU, Musculoskeletal, constitutional, breasts, reproductive, HEENT.  All pertinent positives are noted in the HPI.  All others are negative.   PHYSICAL EXAM:   Vitals with BMI 03/23/2018  Height 5' 9"   Weight 213 lbs  BMI 374.25 Systolic 1956 Diastolic 82  Pulse 88  Respirations 20   General: Alert and oriented, in no acute distress HEENT: Head is normocephalic. Extraocular movements are intact. Oropharynx is clear. Neck: at least 2 borderline enlarged left SCV masses, slightly greater than 1cm each Heart: Regular in rate and rhythm with no murmurs, rubs, or gallops. Chest: Clear to auscultation bilaterally, with no rhonchi, wheezes, or rales. Abdomen: Soft, nontender, nondistended, with no rigidity or guarding. Extremities: No cyanosis or edema. Lymphatics: see Neck Exam Skin: No concerning lesions. Musculoskeletal: symmetric strength and muscle tone throughout. Neurologic: Cranial nerves II through XII are grossly intact. No obvious  focalities. Speech is fluent. Coordination is intact. Psychiatric: Judgment and insight are intact. Affect is appropriate. Breasts: lower right breast - two areas of swelling at biopsy sites  Left breast - majority of UOQ is swollen/firm; there is obvious swelling in the left axilla (at least 4 cm in total dimension, visible without palpation).  ECOG = 0  0 - Asymptomatic (Fully active, able to carry on all predisease activities without restriction)  1 - Symptomatic but completely ambulatory (Restricted in physically strenuous activity but ambulatory and able to carry out work of a light or sedentary nature. For example, light housework, office work)  2 - Symptomatic, <50% in bed during the day (Ambulatory and capable of all self care but unable to carry out any work activities. Up and about more than 50% of waking hours)  3 - Symptomatic, >50% in bed, but not bedbound (Capable of only limited self-care, confined to bed or chair 50% or more of waking hours)  4 - Bedbound (Completely disabled. Cannot carry on any self-care. Totally confined to bed or chair)  5 - Death   OEustace PenMM, Creech RH, Tormey DC, et al. [phone]. "Toxicity and response criteria of the [facility]". ABrownwoodOncol. 5 (6): 649-55   LABORATORY DATA:  Lab Results  Component Value Date   WBC 5.3 03/23/2018   HGB 14.3 03/23/2018   HCT 43.7 03/23/2018   MCV 93.4 03/23/2018  PLT 354 03/23/2018   CMP     Component Value Date/Time   NA 139 03/23/2018 0855   K 4.2 03/23/2018 0855   CL 103 03/23/2018 0855   CO2 25 03/23/2018 0855   GLUCOSE 83 03/23/2018 0855   BUN 8 03/23/2018 0855   CREATININE 0.67 03/23/2018 0855   CALCIUM 9.4 03/23/2018 0855   PROT 7.6 03/23/2018 0855   ALBUMIN 3.3 (L) 03/23/2018 0855   AST 15 03/23/2018 0855   ALT 17 03/23/2018 0855   ALKPHOS 70 03/23/2018 0855   BILITOT 1.0 03/23/2018 0855   GFRNONAA >60 03/23/2018 0855   GFRAA >60 03/23/2018 0855           RADIOGRAPHY: US Breast Ltd Uni Left Inc Axilla  Result Date: 03/11/2018 CLINICAL DATA:  47 year old female presenting for evaluation of left axillary swelling and tenderness. EXAM: DIGITAL DIAGNOSTIC LEFT MAMMOGRAM WITH CAD AND TOMO ULTRASOUND LEFT BREAST COMPARISON:  Previous exam(s). ACR Breast Density Category c: The breast tissue is heterogeneously dense, which may obscure small masses. FINDINGS: A BB has been placed over the left axilla at the palpable site of concern. On the spot compression tomosynthesis images deep to this marker there are numerous markedly enlarged left axillary lymph nodes. New skin thickening is identified particularly of the periareolar an inferior left breast. Spot compression tomosynthesis images of the superior to upper-outer left breast demonstrates an area of ill-defined distortion. Mammographic images were processed with CAD. On physical exam, there is a firm lump superior and slightly lateral to the left nipple. There is swelling of the left axilla with increased density along the inferior aspect of the swelling. Ultrasound of the left breast between 12 and 3 o'clock, between demonstrates ill-defined irregular hypoechoic tissue. As measured at the 12 o'clock position, 2 cm from the nipple this spans at least 4.2 x 2.1 x 3.1 cm. Multiple scattered cysts are seen in the upper-outer left breast. Ultrasound of the left axilla demonstrates numerous markedly enlarged lymph nodes. The cortex of 1 of these lymph nodes measures up to 1.3 cm. At least 6 abnormal lymph nodes are identified. IMPRESSION: 1. There is a broad suspicious in the upper-outer quadrant of the left breast measuring at least 4.2 cm. True measurement of the size is difficult due to the irregular shape an ill-defined appearance. 2. There are numerous (at least 6) markedly abnormal left axillary lymph nodes. RECOMMENDATION: 1. Ultrasound guided biopsy is recommended for the left breast mass which spans between 1 and  3 o'clock. Ultrasound biopsy of one of the left axillary lymph nodes is also recommended. The procedure has been scheduled for 03/16/2018 at 12:45 p.m. 2. If pathology results indicate malignancy, and breast conservation is a consideration, MRI should be performed to evaluate the true extent of disease. I have discussed the findings and recommendations with the patient. Results were also provided in writing at the conclusion of the visit. If applicable, a reminder letter will be sent to the patient regarding the next appointment. BI-RADS CATEGORY  5: Highly suggestive of malignancy. Electronically Signed   By: Ammie Ferrier M.D.   On: 03/11/2018 16:50   US Breast Ltd Uni Right Inc Axilla  Result Date: 03/16/2018 CLINICAL DATA:  47 year old female with recent workup of the left breast demonstrating a suspicious mass an adenopathy. She presents today for evaluation of the right breast. EXAM: DIGITAL DIAGNOSTIC RIGHT MAMMOGRAM WITH CAD AND TOMO ULTRASOUND RIGHT BREAST COMPARISON:  Previous exam(s). ACR Breast Density Category c: The breast tissue is  heterogeneously dense, which may obscure small masses. FINDINGS: Multiple circumscribed, equal density masses are noted in the upper-outer quadrant of the right breast. They appear mammographically stable, though evaluation is limited due to dense breast density. Further evaluation with ultrasound was performed. Mammographic images were processed with CAD. Targeted ultrasound is performed, showing diffuse fibrocystic changes demonstrating multiple circumscribed anechoic masses throughout the upper outer and inferior right breast. An additional hypoechoic mass is noted at the 9 o'clock position 3 cm from the nipple. It measures 8 x 7 x 7 mm. There is no associated vascularity. A complex solid and cystic mass is identified superficially at the 6 o'clock position 3 cm from the nipple. It measures 1.6 x 1.4 x 0.3 cm. There is noted associated vascularity. Evaluation of  the right axilla demonstrates no suspicious lymphadenopathy. IMPRESSION: 1. Two indeterminate right breast masses at the 9 o'clock position 3 cm from the nipple and 6 o'clock position 3 cm from the nipple. Recommendation is for ultrasound-guided biopsy of both areas. 2. Additional fibrocystic changes of the right breast. 3. No suspicious right axillary lymphadenopathy. RECOMMENDATION: Two area ultrasound-guided biopsy of the right breast. This has been scheduled for 03/18/2018 at 9 a.m. The patient will proceed with 2 area ultrasound-guided biopsy of the left breast to follow. I have discussed the findings and recommendations with the patient. Results were also provided in writing at the conclusion of the visit. If applicable, a reminder letter will be sent to the patient regarding the next appointment. BI-RADS CATEGORY  4: Suspicious. Electronically Signed   By: Kristopher Oppenheim M.D.   On: 03/16/2018 12:59   Mm Diag Breast Tomo Uni Left  Result Date: 03/11/2018 CLINICAL DATA:  47 year old female presenting for evaluation of left axillary swelling and tenderness. EXAM: DIGITAL DIAGNOSTIC LEFT MAMMOGRAM WITH CAD AND TOMO ULTRASOUND LEFT BREAST COMPARISON:  Previous exam(s). ACR Breast Density Category c: The breast tissue is heterogeneously dense, which may obscure small masses. FINDINGS: A BB has been placed over the left axilla at the palpable site of concern. On the spot compression tomosynthesis images deep to this marker there are numerous markedly enlarged left axillary lymph nodes. New skin thickening is identified particularly of the periareolar an inferior left breast. Spot compression tomosynthesis images of the superior to upper-outer left breast demonstrates an area of ill-defined distortion. Mammographic images were processed with CAD. On physical exam, there is a firm lump superior and slightly lateral to the left nipple. There is swelling of the left axilla with increased density along the inferior  aspect of the swelling. Ultrasound of the left breast between 12 and 3 o'clock, between demonstrates ill-defined irregular hypoechoic tissue. As measured at the 12 o'clock position, 2 cm from the nipple this spans at least 4.2 x 2.1 x 3.1 cm. Multiple scattered cysts are seen in the upper-outer left breast. Ultrasound of the left axilla demonstrates numerous markedly enlarged lymph nodes. The cortex of 1 of these lymph nodes measures up to 1.3 cm. At least 6 abnormal lymph nodes are identified. IMPRESSION: 1. There is a broad suspicious in the upper-outer quadrant of the left breast measuring at least 4.2 cm. True measurement of the size is difficult due to the irregular shape an ill-defined appearance. 2. There are numerous (at least 6) markedly abnormal left axillary lymph nodes. RECOMMENDATION: 1. Ultrasound guided biopsy is recommended for the left breast mass which spans between 1 and 3 o'clock. Ultrasound biopsy of one of the left axillary lymph nodes is also recommended.  The procedure has been scheduled for 03/16/2018 at 12:45 p.m. 2. If pathology results indicate malignancy, and breast conservation is a consideration, MRI should be performed to evaluate the true extent of disease. I have discussed the findings and recommendations with the patient. Results were also provided in writing at the conclusion of the visit. If applicable, a reminder letter will be sent to the patient regarding the next appointment. BI-RADS CATEGORY  5: Highly suggestive of malignancy. Electronically Signed   By: Ammie Ferrier M.D.   On: 03/11/2018 16:50   Mm Diag Breast Tomo Uni Right  Result Date: 03/16/2018 CLINICAL DATA:  47 year old female with recent workup of the left breast demonstrating a suspicious mass an adenopathy. She presents today for evaluation of the right breast. EXAM: DIGITAL DIAGNOSTIC RIGHT MAMMOGRAM WITH CAD AND TOMO ULTRASOUND RIGHT BREAST COMPARISON:  Previous exam(s). ACR Breast Density Category c:  The breast tissue is heterogeneously dense, which may obscure small masses. FINDINGS: Multiple circumscribed, equal density masses are noted in the upper-outer quadrant of the right breast. They appear mammographically stable, though evaluation is limited due to dense breast density. Further evaluation with ultrasound was performed. Mammographic images were processed with CAD. Targeted ultrasound is performed, showing diffuse fibrocystic changes demonstrating multiple circumscribed anechoic masses throughout the upper outer and inferior right breast. An additional hypoechoic mass is noted at the 9 o'clock position 3 cm from the nipple. It measures 8 x 7 x 7 mm. There is no associated vascularity. A complex solid and cystic mass is identified superficially at the 6 o'clock position 3 cm from the nipple. It measures 1.6 x 1.4 x 0.3 cm. There is noted associated vascularity. Evaluation of the right axilla demonstrates no suspicious lymphadenopathy. IMPRESSION: 1. Two indeterminate right breast masses at the 9 o'clock position 3 cm from the nipple and 6 o'clock position 3 cm from the nipple. Recommendation is for ultrasound-guided biopsy of both areas. 2. Additional fibrocystic changes of the right breast. 3. No suspicious right axillary lymphadenopathy. RECOMMENDATION: Two area ultrasound-guided biopsy of the right breast. This has been scheduled for 03/18/2018 at 9 a.m. The patient will proceed with 2 area ultrasound-guided biopsy of the left breast to follow. I have discussed the findings and recommendations with the patient. Results were also provided in writing at the conclusion of the visit. If applicable, a reminder letter will be sent to the patient regarding the next appointment. BI-RADS CATEGORY  4: Suspicious. Electronically Signed   By: Kristopher Oppenheim M.D.   On: 03/16/2018 12:59   [place] Axillary Node Core Biopsy Left  Result Date: 03/16/2018 CLINICAL DATA:  47 year old female with a suspicious left breast  mass and axillary adenopathy. EXAM: ULTRASOUND GUIDED LEFT BREAST CORE NEEDLE BIOPSY COMPARISON:  Previous exam(s). FINDINGS: I met with the patient and we discussed the procedure of ultrasound-guided biopsy, including benefits and alternatives. We discussed the high likelihood of a successful procedure. We discussed the risks of the procedure, including infection, bleeding, tissue injury, clip migration, and inadequate sampling. Informed written consent was given. The usual time-out protocol was performed immediately prior to the procedure. Lesion quadrant: Upper-outer quadrant Using sterile technique and 1% Lidocaine as local anesthetic, under direct ultrasound visualization, a 12 gauge spring-loaded device was used to perform biopsy of an ill-defined left breast mass using a lateral approach. At the conclusion of the procedure a ribbon shaped tissue marker clip was deployed into the biopsy cavity. Using sterile technique and 1% Lidocaine as local anesthetic, under direct ultrasound visualization,  a 14 gauge spring-loaded device was used to perform biopsy of a left axillary lymph node using a lateral approach. At the conclusion of the procedure a HydroMARK tissue marker clip was deployed into the biopsy cavity. Follow up 2 view mammogram was performed and dictated separately. IMPRESSION: Ultrasound guided biopsy of the left breast and a left axillary lymph node. No apparent complications. Electronically Signed   By: Kristopher Oppenheim M.D.   On: 03/16/2018 14:09   Mm Clip Placement Left  Result Date: 03/16/2018 CLINICAL DATA:  47 year old female status post ultrasound-guided biopsy of the left breast and left axilla. EXAM: DIAGNOSTIC LEFT MAMMOGRAM POST ULTRASOUND BIOPSY COMPARISON:  Previous exam(s). FINDINGS: Mammographic images were obtained following ultrasound guided biopsy of the left breast and axilla. Ribbon shaped clip is identified in the upper outer quadrant at anterior depth. HydroMARK clip is  identified in the left axilla. IMPRESSION: Post biopsy clips in the expected locations. Final Assessment: Post Procedure Mammograms for Marker Placement Electronically Signed   By: Kristopher Oppenheim M.D.   On: 03/16/2018 14:11   Mm Clip Placement Right  Result Date: 03/18/2018 CLINICAL DATA:  Post biopsy mammogram of the right breast for clip placement. EXAM: DIAGNOSTIC RIGHT MAMMOGRAM POST ULTRASOUND BIOPSY COMPARISON:  Previous exam(s). FINDINGS: Mammographic images were obtained following ultrasound guided biopsy of 2 masses in the right breast. The ribbon shaped biopsy marking clip is well positioned at the site of the mass in the right breast at 6 o'clock. The coil shaped biopsy marking clip is well positioned at the site of the mass in the right breast at 9 o'clock. IMPRESSION: 1. Appropriate positioning of the ribbon shaped biopsy marking clip in the right breast mass at 6 o'clock. 2. Appropriate positioning of the coil shaped biopsy marking clip in the right breast at 9 o'clock Final Assessment: Post Procedure Mammograms for Marker Placement Electronically Signed   By: Ammie Ferrier M.D.   On: 03/18/2018 10:44   [place] Lt Breast Bx W Loc Dev 1st Lesion Img Bx Spec US Guide  Result Date: 03/16/2018 CLINICAL DATA:  47 year old female with a suspicious left breast mass and axillary adenopathy. EXAM: ULTRASOUND GUIDED LEFT BREAST CORE NEEDLE BIOPSY COMPARISON:  Previous exam(s). FINDINGS: I met with the patient and we discussed the procedure of ultrasound-guided biopsy, including benefits and alternatives. We discussed the high likelihood of a successful procedure. We discussed the risks of the procedure, including infection, bleeding, tissue injury, clip migration, and inadequate sampling. Informed written consent was given. The usual time-out protocol was performed immediately prior to the procedure. Lesion quadrant: Upper-outer quadrant Using sterile technique and 1% Lidocaine as local anesthetic,  under direct ultrasound visualization, a 12 gauge spring-loaded device was used to perform biopsy of an ill-defined left breast mass using a lateral approach. At the conclusion of the procedure a ribbon shaped tissue marker clip was deployed into the biopsy cavity. Using sterile technique and 1% Lidocaine as local anesthetic, under direct ultrasound visualization, a 14 gauge spring-loaded device was used to perform biopsy of a left axillary lymph node using a lateral approach. At the conclusion of the procedure a HydroMARK tissue marker clip was deployed into the biopsy cavity. Follow up 2 view mammogram was performed and dictated separately. IMPRESSION: Ultrasound guided biopsy of the left breast and a left axillary lymph node. No apparent complications. Electronically Signed   By: Kristopher Oppenheim M.D.   On: 03/16/2018 14:09   [place] Rt Breast Bx W Loc Dev 1st Lesion Img Bx  Spec US Guide  Addendum Date: 03/22/2018   ADDENDUM REPORT: 03/22/2018 07:27 ADDENDUM: Pathology revealed FIBROCYSTIC CHANGES, PSEUDOANGIOMATOUS STROMAL HYPERPLASIA ([place]), of the RIGHT breast, 6 o'clock, 3cmfn. FIBROCYSTIC CHANGES WITH FOCI OF CHRONIC INFLAMMATION of the RIGHT breast, 9 o'clock. This was found to be concordant by Dr. Ammie Ferrier. Pathology results were discussed with the patient by telephone. The patient reported doing well after the biopsies with tenderness and minimal bleeding at the sites. Post biopsy instructions and care were reviewed and questions were answered. The patient was encouraged to call [facility] for any additional concerns. The patient has a recent diagnosis of LEFT breast cancer and should follow her outlined treatment plan. The patient was referred to [facility] on March 23, 2018. Recommendation for a bilateral breast MRI if breast conservation is a consideration for further evaluation of extent  of disease due to heterogeneously dense breast tissue. Pathology results reported by Terie Purser, RN on 03/22/2018. Electronically Signed   By: Ammie Ferrier M.D.   On: 03/22/2018 07:27   Result Date: 03/22/2018 CLINICAL DATA:  47 year old female presenting for ultrasound-guided biopsy of 2 masses in the right breast. EXAM: ULTRASOUND GUIDED RIGHT BREAST CORE NEEDLE BIOPSY COMPARISON:  Previous exam(s). FINDINGS: I met with the patient and we discussed the procedure of ultrasound-guided biopsy, including benefits and alternatives. We discussed the high likelihood of a successful procedure. We discussed the risks of the procedure, including infection, bleeding, tissue injury, clip migration, and inadequate sampling. Informed written consent was given. The usual time-out protocol was performed immediately prior to the procedure. Lesion quadrant: Lower outer quadrant Using sterile technique and 1% Lidocaine as local anesthetic, under direct ultrasound visualization, a 14 gauge spring-loaded device was used to perform biopsy of a mass in the right breast at 6 o'clock using an inferior approach. At the conclusion of the procedure a ribbon shaped tissue marker clip was deployed into the biopsy cavity. Lesion quadrant: Lower outer quadrant Using sterile technique and 1% Lidocaine as local anesthetic, under direct ultrasound visualization, a 14 gauge spring-loaded device was used to perform biopsy of a mass in the right breast at 9 o'clock using an inferior approach. At the conclusion of the procedure a coil shaped tissue marker clip was deployed into the biopsy cavity. Follow up 2 view mammogram was performed and dictated separately. IMPRESSION: 1. Ultrasound guided biopsy of a right breast mass at 6 o'clock. No apparent complications. 2. Ultrasound-guided biopsy of a right breast mass at 9 o'clock. No apparent complications. Electronically Signed: By: Ammie Ferrier M.D. On: 03/18/2018 10:43   [place] Rt Breast Bx  W Loc Dev Ea Add Lesion Img Bx Spec US Guide  Addendum Date: 03/22/2018   ADDENDUM REPORT: 03/22/2018 07:27 ADDENDUM: Pathology revealed FIBROCYSTIC CHANGES, PSEUDOANGIOMATOUS STROMAL HYPERPLASIA ([place]), of the RIGHT breast, 6 o'clock, 3cmfn. FIBROCYSTIC CHANGES WITH FOCI OF CHRONIC INFLAMMATION of the RIGHT breast, 9 o'clock. This was found to be concordant by Dr. Ammie Ferrier. Pathology results were discussed with the patient by telephone. The patient reported doing well after the biopsies with tenderness and minimal bleeding at the sites. Post biopsy instructions and care were reviewed and questions were answered. The patient was encouraged to call [facility] for any additional concerns. The patient has a recent diagnosis of LEFT breast cancer and should follow her outlined treatment plan. The patient was referred to [facility] at [facility] on March 23, 2018. Recommendation for a bilateral breast MRI if breast conservation is a consideration for further evaluation of extent of disease due to heterogeneously dense breast tissue. Pathology results reported by Terie Purser, RN on 03/22/2018. Electronically Signed   By: Ammie Ferrier M.D.   On: 03/22/2018 07:27   Result Date: 03/22/2018 CLINICAL DATA:  47 year old female presenting for ultrasound-guided biopsy of 2 masses in the right breast. EXAM: ULTRASOUND GUIDED RIGHT BREAST CORE NEEDLE BIOPSY COMPARISON:  Previous exam(s). FINDINGS: I met with the patient and we discussed the procedure of ultrasound-guided biopsy, including benefits and alternatives. We discussed the high likelihood of a successful procedure. We discussed the risks of the procedure, including infection, bleeding, tissue injury, clip migration, and inadequate sampling. Informed written consent was given. The usual time-out protocol was performed immediately prior to the procedure. Lesion  quadrant: Lower outer quadrant Using sterile technique and 1% Lidocaine as local anesthetic, under direct ultrasound visualization, a 14 gauge spring-loaded device was used to perform biopsy of a mass in the right breast at 6 o'clock using an inferior approach. At the conclusion of the procedure a ribbon shaped tissue marker clip was deployed into the biopsy cavity. Lesion quadrant: Lower outer quadrant Using sterile technique and 1% Lidocaine as local anesthetic, under direct ultrasound visualization, a 14 gauge spring-loaded device was used to perform biopsy of a mass in the right breast at 9 o'clock using an inferior approach. At the conclusion of the procedure a coil shaped tissue marker clip was deployed into the biopsy cavity. Follow up 2 view mammogram was performed and dictated separately. IMPRESSION: 1. Ultrasound guided biopsy of a right breast mass at 6 o'clock. No apparent complications. 2. Ultrasound-guided biopsy of a right breast mass at 9 o'clock. No apparent complications. Electronically Signed: By: Ammie Ferrier M.D. On: 03/18/2018 10:43      IMPRESSION/PLAN: This is a lovely 47 yo woman with Left breast cancer  Our team consensus is for her to undergo for staging scans, MRI of breasts, genetic counseling, neoadjuvant chemotherapy, surgery, and then radiotherapy. She is agreeable to this plan.  We discussed the risks, benefits, and side effects of radiotherapy. I recommend radiotherapy to the left breast/chest wall and regional nodes to reduce her risk of locoregional recurrence by 2/3.  We discussed that radiation would take approximately 6-7 weeks to complete and that I would give the patient a few weeks to heal following surgery before starting treatment planning. We spoke about acute effects including skin irritation and fatigue as well as much less common late effects including internal organ injury or irritation. We spoke about the latest technology that is used to minimize the  risk of late effects for patients undergoing radiotherapy to the breast or chest wall. No guarantees of treatment were given. Ms. Santiago Graf is enthusiastic about proceeding with treatment. I look forward to participating in her care.  I will await her referral back to me for postoperative follow-up and eventual CT simulation/treatment planning.  I asked our breast navigators to include a CT neck with her staging scans due to subtle supraclavicular masses, indeterminate, on exam.   __________________________________________   Eppie Gibson, MD

## 2018-03-23 NOTE — Progress Notes (Signed)
Clinical Social Work [place] Psychosocial Distress Screening [place]  Patient completed distress screening protocol and scored a 3 on the Psychosocial Distress Thermometer which indicates low distress. Clinical Social Worker met with patient and patients family in [facility] to assess for distress and other psychosocial needs. Patient stated she was feeling overwhelmed but felt "better" after meeting with the treatment team and getting more information on her treatment plan. CSW and patient discussed common feeling and emotions when being diagnosed with cancer, and the importance of support during treatment. CSW informed patient of the support team and support services at [facility]. CSW provided contact information and encouraged patient to call with any questions or concerns.  ONCBCN DISTRESS SCREENING 03/23/2018  Screening Type Initial Screening  Distress experienced in past week (1-10) 3  Physical Problem type Pain;Sleep/insomnia  Physician notified of physical symptoms Yes     Johnnye Lana, MSW, LCSW, OSW-C Clinical Social Worker [place] [phone]

## 2018-03-23 NOTE — Therapy (Signed)
[place], [place], [address] Phone: [phone]   Fax:  [phone]  Physical Therapy Evaluation  Patient Details  Name: Paula Berry MRN: [id number] Date of Birth: 1970/09/28 Referring Provider (PT): Dr. Rolm Bookbinder   Encounter Date: 03/23/2018  PT End of Session - 03/23/18 0948    Visit Number  1    Number of Visits  1    PT Start Time  1003    PT Stop Time  8088   Also saw pt from 1035-1051 for a total of 26 minutes   PT Time Calculation (min)  10 min    Activity Tolerance  Patient tolerated treatment well    Behavior During Therapy  [facility] for tasks assessed/performed       Past Medical History:  Diagnosis Date  . Anemia    had iron infusion 08/12/15  . Asthma   . Genital herpes   . GERD (gastroesophageal reflux disease)   . Headache    migraines 2-3 times monthly  . Pneumonia    hospitalizied for pneumonia as a child    Past Surgical History:  Procedure Laterality Date  . ABDOMINAL HYSTERECTOMY Bilateral 08/22/2015   Procedure: HYSTERECTOMY ABDOMINAL, BILATERAL SALPINGECTOMY;  Surgeon: Everlene Farrier, MD;  Location: [facility];  Service: Gynecology;  Laterality: Bilateral;  . MYOMECTOMY ABDOMINAL APPROACH      There were no vitals filed for this visit.   Subjective Assessment - 03/23/18 0940    Subjective  Patient reports she is here today to be seen by her medical team for her newly diagnosed left breast cancer. She has had left shoulder pain for 3-4 weeks and went to the urgent care but it appears this is related to her cancer.    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 It measures 4.2 cm and is located in the upper outer quadrant with a Ki67 of 15%. She has 6 enlarged abnormal appearing axillary nodes. One was biopsied and found to be positive. She has asthma, migraines, and a  history of a hysterectomy.    Patient Stated Goals  Reduce lymphedema risk and learn post op shoulder ROM HEP    Currently in Pain?  Yes    Pain Score  8     Pain Location  Shoulder    Pain Orientation  Left    Pain Descriptors / Indicators  --   Feels like a muscle strain   Pain Type  Acute pain    Pain Radiating Towards  scapula    Pain Onset  1 to 4 weeks ago    Pain Frequency  Constant    Aggravating Factors   Laying on either side    Pain Relieving Factors  Nothing         [facility] PT Assessment - 03/23/18 0001      Assessment   Medical Diagnosis  Left breast cancer    Referring Provider (PT)  Dr. Rolm Bookbinder    Onset Date/Surgical Date  03/11/18    Hand Dominance  Right    Prior Therapy  none      Precautions   Precautions  Other (comment)    Precaution Comments  active cancer      Restrictions   Weight Bearing Restrictions  No      Balance Screen   Has the patient fallen in the past 6 months  No  Has the patient had a decrease in activity level because of a fear of falling?   No    Is the patient reluctant to leave their home because of a fear of falling?   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    Leisure  She does zumba, cycling, and dance 4-5x/week for 1-2 hours      Cognition   Overall Cognitive Status  Within Functional Limits for tasks assessed      Posture/Postural Control   Posture/Postural Control  Postural limitations    Postural Limitations  Rounded Shoulders;Forward head      ROM / Strength   AROM / PROM / Strength  AROM;Strength      AROM   Overall AROM Comments  Left shoulder limited by pain    AROM Assessment Site  Shoulder;Cervical    Right/Left Shoulder   Right;Left    Right Shoulder Extension  50 Degrees    Right Shoulder Flexion  142 Degrees    Right Shoulder ABduction  153 Degrees    Right Shoulder Internal Rotation  77 Degrees    Right Shoulder External Rotation  88 Degrees    Left Shoulder Extension  46 Degrees    Left Shoulder Flexion  122 Degrees    Left Shoulder ABduction  143 Degrees    Left Shoulder Internal Rotation  56 Degrees    Left Shoulder External Rotation  82 Degrees    Cervical Flexion  WNL    Cervical Extension  WNL    Cervical - Right Side Bend  WNL    Cervical - Left Side Bend  WNL    Cervical - Right Rotation  WNL    Cervical - Left Rotation  WNL      Strength   Overall Strength  Within functional limits for tasks performed        LYMPHEDEMA/ONCOLOGY QUESTIONNAIRE - 03/23/18 0946      Type   Cancer Type  Left breast cancer      Lymphedema Assessments   Lymphedema Assessments  Upper extremities      Right Upper Extremity Lymphedema   10 cm Proximal to Olecranon Process  31.4 cm    Olecranon Process  26.2 cm    10 cm Proximal to Ulnar Styloid Process  20.9 cm    Just Proximal to Ulnar Styloid Process  15.3 cm    Across Hand at [employer]  18.2 cm    At [place] of 2nd Digit  6 cm      Left Upper Extremity Lymphedema   10 cm Proximal to Olecranon Process  31.4 cm    Olecranon Process  25.3 cm    10 cm Proximal to Ulnar Styloid Process  20.8 cm    Just Proximal to Ulnar Styloid Process  15.3 cm    Across Hand at [employer]  18.1 cm    At [place] of 2nd Digit  5.6 cm          Quick Dash - 03/23/18 0001    Open a tight or new jar  Mild difficulty    Do heavy household chores (wash walls,  wash floors)  Mild difficulty    Carry a shopping bag or briefcase  Mild difficulty    Wash your back  Mild difficulty    Use a knife to cut food  Mild difficulty    Recreational activities in which you take some force or impact through your arm, shoulder, or hand (golf, hammering, tennis)  Severe  difficulty    During the past week, to what extent has your arm, shoulder or hand problem interfered with your normal social activities with family, friends, neighbors, or groups?  Quite a bit    During the past week, to what extent has your arm, shoulder or hand problem limited your work or other regular daily activities  Quite a bit    Arm, shoulder, or hand pain.  Extreme    Tingling (pins and needles) in your arm, shoulder, or hand  Moderate    Difficulty Sleeping  Severe difficulty    DASH Score  52.27 %        Objective measurements completed on examination: See above findings.        Patient was instructed today in a home exercise program today for post op shoulder range of motion. These included active assist shoulder flexion in sitting, scapular retraction, wall walking with shoulder abduction, and hands behind head external rotation.  She was encouraged to do these twice a day, holding 3 seconds and repeating 5 times when permitted by her physician.          PT Education - 03/23/18 0947    Education Details  Lymphedema risk reduction and post op shoulder ROM HEP    Person(s) Educated  Patient    Methods  Explanation;Demonstration;Handout    Comprehension  Verbalized understanding;Returned demonstration           Breast Clinic Goals - 03/23/18 1000      Patient will be able to verbalize understanding of pertinent lymphedema risk reduction practices relevant to her diagnosis specifically related to skin care.   Time  1    Period  Days    Status  Achieved      Patient will be able to return demonstrate and/or verbalize understanding of the post-op home exercise program related to regaining shoulder range of motion.   Time  1    Period  Days    Status  Achieved      Patient will be able to verbalize understanding of the importance of attending the postoperative After Breast Cancer Class for further lymphedema risk reduction education and therapeutic exercise.    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 It measures 4.2 cm and is located in the upper outer quadrant with a Ki67 of 15%. She has 6 enlarged abnormal appearing axillary nodes. One was biopsied and found to be positive. She has asthma, migraines, and a history of a hysterectomy. Her medical team met prior to her assessments to determine a recommended treatment plan. She is planning to have neoadjuvant chemotherapy followed by a left modified radical mastectomy and radiation. Her left shoulder pain and limited range of motion may be related to her cancer but if not, she would benefit from standard physical therapy to decrease pain and improve range of motion. She will  benefit from a post op PT visit to reassess and determine needs.    History and Personal Factors relevant to plan of care:  Unknown extent of disease until staging scans are performed. Positive axillary nodes. Left shoulder pain with insidious onset which may be related to cancer.    Clinical Presentation  Evolving    Clinical Presentation due to:  Unknown extent of disease.    Clinical Decision Making  Moderate    Rehab Potential  Good    Clinical Impairments Affecting Rehab Potential  Unknown extent of disease    PT Frequency  One time visit    PT Treatment/Interventions  ADLs/Self Care Home Management;Therapeutic exercise;Patient/family education    PT Next Visit Plan  Will f/u if ordered by surgeon post operatively    PT Home Exercise Plan  post op shoulder ROM HEP    Consulted and Agree with Plan of Care  Patient       Patient will benefit from skilled therapeutic intervention in order to improve the following deficits and impairments:     Visit Diagnosis: Malignant neoplasm of upper-outer quadrant of left breast in female, estrogen  receptor negative ([place]) - Plan: PT plan of care cert/re-cert  Abnormal posture - Plan: PT plan of care cert/re-cert  Acute pain of left shoulder - Plan: PT plan of care cert/re-cert  Stiffness of left shoulder, not elsewhere classified - Plan: PT plan of care cert/re-cert   Patient will follow up at outpatient cancer rehab 3-4 weeks following surgery.  If the patient requires physical therapy at that time, a specific plan will be dictated and sent to the referring physician for approval. The patient was educated today on appropriate basic range of motion exercises to begin post operatively and the importance of attending the After Breast Cancer class following surgery.  Patient was educated today on lymphedema risk reduction practices as it pertains to recommendations that will benefit the patient immediately following surgery.  She verbalized good understanding.      Problem List Patient Active Problem List   Diagnosis Date Noted  . Malignant neoplasm of upper-outer quadrant of left breast in female, estrogen receptor negative ([place]) 03/21/2018  . Migraine 11/21/2015  . Fibroids 08/22/2015    Annia Friendly, PT 03/23/18 10:58 AM   [place], [place], [address] Phone: [phone]   Fax:  [phone]  Name: Paula Berry MRN: [id number] Date of Birth: 11-01-1970

## 2018-03-23 NOTE — Progress Notes (Signed)
[facility]  Telephone:([phone] Fax:([phone]) [phone]     ID: Katty Fretwell DOB: February 07, 1971  MR#: [id number]  ERX#:[id number]  Patient Care Team: Vernie Shanks, MD as PCP - General (Family Medicine) Rolm Bookbinder, MD as Consulting Physician (General Surgery) Therman Hughlett, Virgie Dad, MD as Consulting Physician (Oncology) Eppie Gibson, MD as Attending Physician (Radiation Oncology) Everlene Farrier, MD as Consulting Physician (Obstetrics and Gynecology) Chauncey Cruel, MD OTHER MD:  CHIEF COMPLAINT: Triple negative breast cancer  CURRENT TREATMENT: Neoadjuvant chemotherapy   HISTORY OF CURRENT ILLNESS: The patient had a left mammogram with tomography and ultrasonography 07/12/2015 at the [facility] after screening recall for a possible left breast mass.  This found the breast density to be category C.  There was a small lobulated mass in the lower outer aspect of the left breast, which was not palpable.  Ultrasound showed a small lobulated cyst in that area measuring 0.6 cm.  This was felt to be benign.  In July 2019 she had screening mammography at Dr. Clementeen Hoof office.  I do not have that report but according to the patient it was unremarkable.  In October 2019 however the patient developed left axillary swelling and tenderness and was again set up for left mammography with tomography and left breast ultrasonography at the [facility], 03/11/2018.  Breast density was category C.  Mammography showed numerous markedly enlarged left axillary lymph nodes.  There was periareolar skin thickening.  There was an area of ill-defined distortion in the upper outer left breast and on physical exam there was a firm lump superior and lateral to the left nipple, with swelling of the left axilla.  Ultrasound of the left breast and both upper quadrants found an ill-defined irregular hypoechoic area measuring at least 4.2 cm.  There also multiple scattered cysts.   Ultrasound of the left axilla showed at least 6 abnormal lymph nodes.  On 03/16/2018 the patient underwent right mammography with tomography and right breast ultrasonography at the [facility].  Again breast density was category C.  There were multiple circumscribed equal density masses in the upper outer quadrant of the right breast which appeared stable.  Ultrasound showed diffuse fibrocystic changes.  An additional hypoechoic mass was noted at the 9 o'clock position 3 cm from the nipple measuring 0.8 cm, with no associated vascularity.  A complex solid and cystic mass was noted superficially at the 6 o'clock position 3 cm from the nipple measuring 1.6 cm.  Evaluation of the right axilla was negative.  On 03/18/2018 the patient underwent biopsy of the 2 suspicious areas in the right breast, and they both showed only fibrocystic changes with some chronic inflammation ([id number] [phone]).  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  The patient's subsequent history is as detailed below.  INTERVAL HISTORY: Careli was evaluated in the multidisciplinary breast cancer clinic on 03/23/2018 accompanied by her husband Edd Arbour and her sister Lanelle Bal. Her case was also presented at the multidisciplinary breast cancer conference on the same day. At that time a preliminary plan was proposed: Completion staging studies, neoadjuvant chemotherapy, likely a left modified radical mastectomy, genetics testing, adjuvant radiation   REVIEW OF SYSTEMS: Lielle usually does Zumba twice a week and belly dancing every Monday.  She has not been exercising for the  last few weeks however.  She complains of tightness and discomfort in the left shoulder and left axilla.  There has been no swelling of the left  upper extremity the patient denies unusual headaches, visual changes, nausea, vomiting, stiff neck, dizziness, or gait imbalance. There has been no cough, phlegm production, or pleurisy, no chest pain or pressure, and no change in bowel or bladder habits. The patient denies fever, rash, bleeding, unexplained fatigue or unexplained weight loss. A detailed review of systems was otherwise entirely negative.   PAST MEDICAL HISTORY: Past Medical History:  Diagnosis Date  . Anemia    had iron infusion 08/12/15  . Asthma   . Genital herpes   . GERD (gastroesophageal reflux disease)   . Headache    migraines 2-3 times monthly  . Pneumonia    hospitalizied for pneumonia as a child    PAST SURGICAL HISTORY: Past Surgical History:  Procedure Laterality Date  . ABDOMINAL HYSTERECTOMY Bilateral 08/22/2015   Procedure: HYSTERECTOMY ABDOMINAL, BILATERAL SALPINGECTOMY;  Surgeon: Everlene Farrier, MD;  Location: [facility];  Service: Gynecology;  Laterality: Bilateral;  . MYOMECTOMY ABDOMINAL APPROACH      FAMILY HISTORY Family History  Problem Relation Age of Onset  . Hypertension Unknown   . Diabetes Unknown   . Cancer Unknown   . Dementia Maternal Grandfather   . Lung cancer Paternal Grandmother   . Brain cancer Paternal Aunt   . Migraines Neg Hx   The patient's parents are in their early 46s as of October 2019.  A paternal aunt had brain cancer at age 46.  A paternal grandmother had lung cancer at age 46.  There is no breast or ovarian cancer history in the family.  However note that the patient has little information on her mother side of the family.  GYNECOLOGIC HISTORY:  No LMP recorded. Patient has had a hysterectomy. 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  Her husband [name]. works for the [employer].  He is a former patient here, with stage IV colon cancer.  They live alone, with no pets.    ADVANCED DIRECTIVES: Not in place   HEALTH MAINTENANCE: Social History   Tobacco Use  . Smoking status: Never Smoker  . Smokeless tobacco: Never Used  Substance Use Topics  . Alcohol use: Yes    Alcohol/week: 1.0 standard drinks    Types: 1 Glasses of wine per week    Comment: daily  . Drug use: No     Colonoscopy: Never  PAP: Status post hysterectomy  Bone density: Never   Allergies  Allergen Reactions  . Neosporin [Neomycin-Bacitracin Zn-Polymyx] Hives    Decreased wound healing with blisters    Current Outpatient Medications  Medication Sig Dispense Refill  . albuterol (PROVENTIL HFA;VENTOLIN HFA) 108 (90 Base) MCG/ACT inhaler Inhale 1-2 puffs into the lungs every 6 (six) hours as needed for wheezing or shortness of breath.    Marland Kitchen aspirin-acetaminophen-caffeine (EXCEDRIN MIGRAINE) 250-250-65 MG tablet Take 2 tablets by mouth daily as needed for headache or migraine.    Marland Kitchen dexamethasone (DECADRON) 4 MG tablet Take 2 tablets by mouth once a day on the day after chemotherapy and then take 2 tablets two times a day for 2 days.  Take with food. 30 tablet 1  . Diclofenac Potassium (CAMBIA) 50 MG PACK Take by mouth. 1 packet w/1-2 oz water prn headaches 12/25/15 [facility] - approved from 12/11/15 through 12/10/17, PA # [id number]    . ibuprofen (ADVIL,MOTRIN) 600 MG tablet Take 1 tablet (600 mg total) by mouth every 6 (six) hours as needed (mild pain). (Patient taking differently: Take 600 mg by mouth every 6 (six) hours as needed (mild pain). Only takes when she has a headache about 2x/month (11/20/15)) 30 tablet 0  . lidocaine-prilocaine (EMLA) cream Apply to affected area once 30 g 3  . LORazepam (ATIVAN) 0.5 MG tablet Take 1 tablet (0.5 mg total) by mouth at bedtime as needed (Nausea or vomiting). 30  tablet 0  . ondansetron (ZOFRAN-ODT) 4 MG disintegrating tablet Take 1-2 tablet (4-86m total) by mouth every 8 (eight) hours as needed for nausea. 30 tablet 12  . oxyCODONE-acetaminophen (PERCOCET/ROXICET) 5-325 MG tablet Take 1-2 tablets by mouth every 6 (six) hours as needed (moderate to severe pain (when tolerating fluids)). (Patient not taking: Reported on 11/20/2015) 30 tablet 0  . pantoprazole (PROTONIX) 20 MG tablet Take 20 mg by mouth daily as needed for heartburn.    . prochlorperazine (COMPAZINE) 10 MG tablet Take 1 tablet (10 mg total) by mouth every 6 (six) hours as needed (Nausea or vomiting). 30 tablet 1  . valACYclovir (VALTREX) 500 MG tablet Take 500 mg by mouth 3 (three) times daily as needed (for outbreaks).     No current facility-administered medications for this visit.     OBJECTIVE: Young African-American woman who appears well  Vitals:   03/23/18 0906  BP: 126/82  Pulse: 88  Resp: 20  Temp: 98.5 F (36.9 C)  SpO2: 99%     Body mass index is 31.45 kg/m.   Wt Readings from Last 3 Encounters:  03/23/18 213 lb (96.6 kg)  11/20/15 219 lb 3.2 oz (99.4 kg)  08/22/15 212 lb (96.2 kg)      ECOG FS:1 - Symptomatic but completely ambulatory  Ocular: Sclerae unicteric, pupils round and equal Lymphatic: Half a centimeter left cervical lymph node noted Lungs no rales or rhonchi Heart regular rate and rhythm Abd soft, nontender, positive bowel sounds MSK no focal spinal tenderness, no joint edema Neuro: non-focal, well-oriented, appropriate affect Breasts: The right breast shows an area of induration inferior to the areola.  The left breast shows induration in the superior aspect, across both quadrants, but no erythema.  There is palpable adenopathy in the left axilla.   LAB RESULTS:  CMP     Component Value Date/Time   NA 139 03/23/2018 0855   K 4.2 03/23/2018 0855   CL 103 03/23/2018 0855   CO2 25 03/23/2018 0855   GLUCOSE 83 03/23/2018 0855   BUN 8  03/23/2018 0855   CREATININE 0.67 03/23/2018 0855   CALCIUM 9.4 03/23/2018 0855   PROT 7.6 03/23/2018 0855   ALBUMIN 3.3 (L) 03/23/2018 0855   AST 15 03/23/2018 0855   ALT 17 03/23/2018 0855   ALKPHOS 70 03/23/2018 0855   BILITOT 1.0 03/23/2018 0855   GFRNONAA >60 03/23/2018 0855   GFRAA >60 03/23/2018 0855    No results found for: TOTALPROTELP, ALBUMINELP, A1GS, A2GS, BETS, BETA2SER, GAMS, MSPIKE, SPEI  No results found for: KPAFRELGTCHN, LAMBDASER, KAPLAMBRATIO  Lab Results  Component Value Date   WBC 5.3 03/23/2018   NEUTROABS 3.0 03/23/2018   HGB 14.3 03/23/2018   HCT 43.7 03/23/2018   MCV  93.4 03/23/2018   PLT 354 03/23/2018      Chemistry      Component Value Date/Time   NA 139 03/23/2018 0855   K 4.2 03/23/2018 0855   CL 103 03/23/2018 0855   CO2 25 03/23/2018 0855   BUN 8 03/23/2018 0855   CREATININE 0.67 03/23/2018 0855      Component Value Date/Time   CALCIUM 9.4 03/23/2018 0855   ALKPHOS 70 03/23/2018 0855   AST 15 03/23/2018 0855   ALT 17 03/23/2018 0855   BILITOT 1.0 03/23/2018 0855       No results found for: LABCA2  No components found for: [id number]  No results for input(s): INR in the last 168 hours.  No results found for: LABCA2  No results found for: ELF810  No results found for: FBP102  No results found for: HEN277  No results found for: CA2729  No components found for: HGQUANT  No results found for: CEA1 / No results found for: CEA1   No results found for: AFPTUMOR  No results found for: Joice  No results found for: PSA1  Appointment on 03/23/2018  Component Date Value Ref Range Status  . Sodium 03/23/2018 139  135 - 145 mmol/L Final  . Potassium 03/23/2018 4.2  3.5 - 5.1 mmol/L Final  . Chloride 03/23/2018 103  98 - 111 mmol/L Final  . CO2 03/23/2018 25  22 - 32 mmol/L Final  . Glucose, Bld 03/23/2018 83  70 - 99 mg/dL Final  . BUN 03/23/2018 8  6 - 20 mg/dL Final  . Creatinine 03/23/2018 0.67  0.44 - 1.00  mg/dL Final  . Calcium 03/23/2018 9.4  8.9 - 10.3 mg/dL Final  . Total Protein 03/23/2018 7.6  6.5 - 8.1 g/dL Final  . Albumin 03/23/2018 3.3* 3.5 - 5.0 g/dL Final  . AST 03/23/2018 15  15 - 41 U/L Final  . ALT 03/23/2018 17  0 - 44 U/L Final  . Alkaline Phosphatase 03/23/2018 70  38 - 126 U/L Final  . Total Bilirubin 03/23/2018 1.0  0.3 - 1.2 mg/dL Final  . GFR, Est Non Af Am 03/23/2018 >60  >60 mL/min Final  . GFR, Est AFR Am 03/23/2018 >60  >60 mL/min Final   Comment: (NOTE) The eGFR has been calculated using the CKD EPI equation. This calculation has not been validated in all clinical situations. eGFR's persistently <60 mL/min signify possible Chronic Kidney Disease.   Georgiann Hahn gap 03/23/2018 11  5 - 15 Final   Performed at [facility], [place] [address] [place], [place] [address]  . WBC Count 03/23/2018 5.3  4.0 - 10.5 K/uL Final  . RBC 03/23/2018 4.68  3.87 - 5.11 MIL/uL Final  . Hemoglobin 03/23/2018 14.3  12.0 - 15.0 g/dL Final  . HCT 03/23/2018 43.7  36.0 - 46.0 % Final  . MCV 03/23/2018 93.4  80.0 - 100.0 fL Final  . MCH 03/23/2018 30.6  26.0 - 34.0 pg Final  . MCHC 03/23/2018 32.7  30.0 - 36.0 g/dL Final  . RDW 03/23/2018 11.4* 11.5 - 15.5 % Final  . Platelet Count 03/23/2018 354  150 - 400 K/uL Final  . nRBC 03/23/2018 0.0  0.0 - 0.2 % Final  . Neutrophils Relative % 03/23/2018 56  % Final  . Neutro Abs 03/23/2018 3.0  1.7 - 7.7 K/uL Final  . Lymphocytes Relative 03/23/2018 30  % Final  . Lymphs Abs 03/23/2018 1.6  0.7 - 4.0 K/uL Final  .  Monocytes Relative 03/23/2018 12  % Final  . Monocytes Absolute 03/23/2018 0.7  0.1 - 1.0 K/uL Final  . Eosinophils Relative 03/23/2018 1  % Final  . Eosinophils Absolute 03/23/2018 0.1  0.0 - 0.5 K/uL Final  . Basophils Relative 03/23/2018 1  % Final  . Basophils Absolute 03/23/2018 0.0  0.0 - 0.1 K/uL Final  . Immature Granulocytes 03/23/2018 0  % Final  . Abs Immature Granulocytes 03/23/2018 0.00  0.00 -  0.07 K/uL Final   Performed at [facility], [place] [address], [place], [place] [address]    (this displays the last labs from the last 3 days)  No results found for: TOTALPROTELP, ALBUMINELP, A1GS, A2GS, BETS, BETA2SER, GAMS, MSPIKE, SPEI (this displays SPEP labs)  No results found for: KPAFRELGTCHN, LAMBDASER, KAPLAMBRATIO (kappa/lambda light chains)  No results found for: HGBA, HGBA2QUANT, HGBFQUANT, HGBSQUAN (Hemoglobinopathy evaluation)   No results found for: LDH  No results found for: IRON, TIBC, IRONPCTSAT (Iron and TIBC)  No results found for: FERRITIN  Urinalysis No results found for: COLORURINE, APPEARANCEUR, LABSPEC, PHURINE, GLUCOSEU, HGBUR, BILIRUBINUR, KETONESUR, PROTEINUR, UROBILINOGEN, NITRITE, LEUKOCYTESUR   STUDIES: US Breast Ltd Uni Left Inc Axilla  Result Date: 03/11/2018 CLINICAL DATA:  47 year old female presenting for evaluation of left axillary swelling and tenderness. EXAM: DIGITAL DIAGNOSTIC LEFT MAMMOGRAM WITH CAD AND TOMO ULTRASOUND LEFT BREAST COMPARISON:  Previous exam(s). ACR Breast Density Category c: The breast tissue is heterogeneously dense, which may obscure small masses. FINDINGS: A BB has been placed over the left axilla at the palpable site of concern. On the spot compression tomosynthesis images deep to this marker there are numerous markedly enlarged left axillary lymph nodes. New skin thickening is identified particularly of the periareolar an inferior left breast. Spot compression tomosynthesis images of the superior to upper-outer left breast demonstrates an area of ill-defined distortion. Mammographic images were processed with CAD. On physical exam, there is a firm lump superior and slightly lateral to the left nipple. There is swelling of the left axilla with increased density along the inferior aspect of the swelling. Ultrasound of the left breast between 12 and 3 o'clock, between demonstrates ill-defined irregular  hypoechoic tissue. As measured at the 12 o'clock position, 2 cm from the nipple this spans at least 4.2 x 2.1 x 3.1 cm. Multiple scattered cysts are seen in the upper-outer left breast. Ultrasound of the left axilla demonstrates numerous markedly enlarged lymph nodes. The cortex of 1 of these lymph nodes measures up to 1.3 cm. At least 6 abnormal lymph nodes are identified. IMPRESSION: 1. There is a broad suspicious in the upper-outer quadrant of the left breast measuring at least 4.2 cm. True measurement of the size is difficult due to the irregular shape an ill-defined appearance. 2. There are numerous (at least 6) markedly abnormal left axillary lymph nodes. RECOMMENDATION: 1. Ultrasound guided biopsy is recommended for the left breast mass which spans between 1 and 3 o'clock. Ultrasound biopsy of one of the left axillary lymph nodes is also recommended. The procedure has been scheduled for 03/16/2018 at 12:45 p.m. 2. If pathology results indicate malignancy, and breast conservation is a consideration, MRI should be performed to evaluate the true extent of disease. I have discussed the findings and recommendations with the patient. Results were also provided in writing at the conclusion of the visit. If applicable, a reminder letter will be sent to the patient regarding the next appointment. BI-RADS CATEGORY  5: Highly suggestive of malignancy. Electronically Signed  By: Ammie Ferrier M.D.   On: 03/11/2018 16:50   US Breast Ltd Uni Right Inc Axilla  Result Date: 03/16/2018 CLINICAL DATA:  47 year old female with recent workup of the left breast demonstrating a suspicious mass an adenopathy. She presents today for evaluation of the right breast. EXAM: DIGITAL DIAGNOSTIC RIGHT MAMMOGRAM WITH CAD AND TOMO ULTRASOUND RIGHT BREAST COMPARISON:  Previous exam(s). ACR Breast Density Category c: The breast tissue is heterogeneously dense, which may obscure small masses. FINDINGS: Multiple circumscribed, equal  density masses are noted in the upper-outer quadrant of the right breast. They appear mammographically stable, though evaluation is limited due to dense breast density. Further evaluation with ultrasound was performed. Mammographic images were processed with CAD. Targeted ultrasound is performed, showing diffuse fibrocystic changes demonstrating multiple circumscribed anechoic masses throughout the upper outer and inferior right breast. An additional hypoechoic mass is noted at the 9 o'clock position 3 cm from the nipple. It measures 8 x 7 x 7 mm. There is no associated vascularity. A complex solid and cystic mass is identified superficially at the 6 o'clock position 3 cm from the nipple. It measures 1.6 x 1.4 x 0.3 cm. There is noted associated vascularity. Evaluation of the right axilla demonstrates no suspicious lymphadenopathy. IMPRESSION: 1. Two indeterminate right breast masses at the 9 o'clock position 3 cm from the nipple and 6 o'clock position 3 cm from the nipple. Recommendation is for ultrasound-guided biopsy of both areas. 2. Additional fibrocystic changes of the right breast. 3. No suspicious right axillary lymphadenopathy. RECOMMENDATION: Two area ultrasound-guided biopsy of the right breast. This has been scheduled for 03/18/2018 at 9 a.m. The patient will proceed with 2 area ultrasound-guided biopsy of the left breast to follow. I have discussed the findings and recommendations with the patient. Results were also provided in writing at the conclusion of the visit. If applicable, a reminder letter will be sent to the patient regarding the next appointment. BI-RADS CATEGORY  4: Suspicious. Electronically Signed   By: Kristopher Oppenheim M.D.   On: 03/16/2018 12:59   Mm Diag Breast Tomo Uni Left  Result Date: 03/11/2018 CLINICAL DATA:  47 year old female presenting for evaluation of left axillary swelling and tenderness. EXAM: DIGITAL DIAGNOSTIC LEFT MAMMOGRAM WITH CAD AND TOMO ULTRASOUND LEFT BREAST  COMPARISON:  Previous exam(s). ACR Breast Density Category c: The breast tissue is heterogeneously dense, which may obscure small masses. FINDINGS: A BB has been placed over the left axilla at the palpable site of concern. On the spot compression tomosynthesis images deep to this marker there are numerous markedly enlarged left axillary lymph nodes. New skin thickening is identified particularly of the periareolar an inferior left breast. Spot compression tomosynthesis images of the superior to upper-outer left breast demonstrates an area of ill-defined distortion. Mammographic images were processed with CAD. On physical exam, there is a firm lump superior and slightly lateral to the left nipple. There is swelling of the left axilla with increased density along the inferior aspect of the swelling. Ultrasound of the left breast between 12 and 3 o'clock, between demonstrates ill-defined irregular hypoechoic tissue. As measured at the 12 o'clock position, 2 cm from the nipple this spans at least 4.2 x 2.1 x 3.1 cm. Multiple scattered cysts are seen in the upper-outer left breast. Ultrasound of the left axilla demonstrates numerous markedly enlarged lymph nodes. The cortex of 1 of these lymph nodes measures up to 1.3 cm. At least 6 abnormal lymph nodes are identified. IMPRESSION: 1. There is a  broad suspicious in the upper-outer quadrant of the left breast measuring at least 4.2 cm. True measurement of the size is difficult due to the irregular shape an ill-defined appearance. 2. There are numerous (at least 6) markedly abnormal left axillary lymph nodes. RECOMMENDATION: 1. Ultrasound guided biopsy is recommended for the left breast mass which spans between 1 and 3 o'clock. Ultrasound biopsy of one of the left axillary lymph nodes is also recommended. The procedure has been scheduled for 03/16/2018 at 12:45 p.m. 2. If pathology results indicate malignancy, and breast conservation is a consideration, MRI should be  performed to evaluate the true extent of disease. I have discussed the findings and recommendations with the patient. Results were also provided in writing at the conclusion of the visit. If applicable, a reminder letter will be sent to the patient regarding the next appointment. BI-RADS CATEGORY  5: Highly suggestive of malignancy. Electronically Signed   By: Ammie Ferrier M.D.   On: 03/11/2018 16:50   Mm Diag Breast Tomo Uni Right  Result Date: 03/16/2018 CLINICAL DATA:  48 year old female with recent workup of the left breast demonstrating a suspicious mass an adenopathy. She presents today for evaluation of the right breast. EXAM: DIGITAL DIAGNOSTIC RIGHT MAMMOGRAM WITH CAD AND TOMO ULTRASOUND RIGHT BREAST COMPARISON:  Previous exam(s). ACR Breast Density Category c: The breast tissue is heterogeneously dense, which may obscure small masses. FINDINGS: Multiple circumscribed, equal density masses are noted in the upper-outer quadrant of the right breast. They appear mammographically stable, though evaluation is limited due to dense breast density. Further evaluation with ultrasound was performed. Mammographic images were processed with CAD. Targeted ultrasound is performed, showing diffuse fibrocystic changes demonstrating multiple circumscribed anechoic masses throughout the upper outer and inferior right breast. An additional hypoechoic mass is noted at the 9 o'clock position 3 cm from the nipple. It measures 8 x 7 x 7 mm. There is no associated vascularity. A complex solid and cystic mass is identified superficially at the 6 o'clock position 3 cm from the nipple. It measures 1.6 x 1.4 x 0.3 cm. There is noted associated vascularity. Evaluation of the right axilla demonstrates no suspicious lymphadenopathy. IMPRESSION: 1. Two indeterminate right breast masses at the 9 o'clock position 3 cm from the nipple and 6 o'clock position 3 cm from the nipple. Recommendation is for ultrasound-guided biopsy of  both areas. 2. Additional fibrocystic changes of the right breast. 3. No suspicious right axillary lymphadenopathy. RECOMMENDATION: Two area ultrasound-guided biopsy of the right breast. This has been scheduled for 03/18/2018 at 9 a.m. The patient will proceed with 2 area ultrasound-guided biopsy of the left breast to follow. I have discussed the findings and recommendations with the patient. Results were also provided in writing at the conclusion of the visit. If applicable, a reminder letter will be sent to the patient regarding the next appointment. BI-RADS CATEGORY  4: Suspicious. Electronically Signed   By: Kristopher Oppenheim M.D.   On: 03/16/2018 12:59   [place] Axillary Node Core Biopsy Left  Result Date: 03/16/2018 CLINICAL DATA:  47 year old female with a suspicious left breast mass and axillary adenopathy. EXAM: ULTRASOUND GUIDED LEFT BREAST CORE NEEDLE BIOPSY COMPARISON:  Previous exam(s). FINDINGS: I met with the patient and we discussed the procedure of ultrasound-guided biopsy, including benefits and alternatives. We discussed the high likelihood of a successful procedure. We discussed the risks of the procedure, including infection, bleeding, tissue injury, clip migration, and inadequate sampling. Informed written consent was given. The usual time-out protocol  was performed immediately prior to the procedure. Lesion quadrant: Upper-outer quadrant Using sterile technique and 1% Lidocaine as local anesthetic, under direct ultrasound visualization, a 12 gauge spring-loaded device was used to perform biopsy of an ill-defined left breast mass using a lateral approach. At the conclusion of the procedure a ribbon shaped tissue marker clip was deployed into the biopsy cavity. Using sterile technique and 1% Lidocaine as local anesthetic, under direct ultrasound visualization, a 14 gauge spring-loaded device was used to perform biopsy of a left axillary lymph node using a lateral approach. At the conclusion of the  procedure a HydroMARK tissue marker clip was deployed into the biopsy cavity. Follow up 2 view mammogram was performed and dictated separately. IMPRESSION: Ultrasound guided biopsy of the left breast and a left axillary lymph node. No apparent complications. Electronically Signed   By: Kristopher Oppenheim M.D.   On: 03/16/2018 14:09   Mm Clip Placement Left  Result Date: 03/16/2018 CLINICAL DATA:  47 year old female status post ultrasound-guided biopsy of the left breast and left axilla. EXAM: DIAGNOSTIC LEFT MAMMOGRAM POST ULTRASOUND BIOPSY COMPARISON:  Previous exam(s). FINDINGS: Mammographic images were obtained following ultrasound guided biopsy of the left breast and axilla. Ribbon shaped clip is identified in the upper outer quadrant at anterior depth. HydroMARK clip is identified in the left axilla. IMPRESSION: Post biopsy clips in the expected locations. Final Assessment: Post Procedure Mammograms for Marker Placement Electronically Signed   By: Kristopher Oppenheim M.D.   On: 03/16/2018 14:11   Mm Clip Placement Right  Result Date: 03/18/2018 CLINICAL DATA:  Post biopsy mammogram of the right breast for clip placement. EXAM: DIAGNOSTIC RIGHT MAMMOGRAM POST ULTRASOUND BIOPSY COMPARISON:  Previous exam(s). FINDINGS: Mammographic images were obtained following ultrasound guided biopsy of 2 masses in the right breast. The ribbon shaped biopsy marking clip is well positioned at the site of the mass in the right breast at 6 o'clock. The coil shaped biopsy marking clip is well positioned at the site of the mass in the right breast at 9 o'clock. IMPRESSION: 1. Appropriate positioning of the ribbon shaped biopsy marking clip in the right breast mass at 6 o'clock. 2. Appropriate positioning of the coil shaped biopsy marking clip in the right breast at 9 o'clock Final Assessment: Post Procedure Mammograms for Marker Placement Electronically Signed   By: Ammie Ferrier M.D.   On: 03/18/2018 10:44   [place] Lt Breast Bx  W Loc Dev 1st Lesion Img Bx Spec US Guide  Result Date: 03/16/2018 CLINICAL DATA:  47 year old female with a suspicious left breast mass and axillary adenopathy. EXAM: ULTRASOUND GUIDED LEFT BREAST CORE NEEDLE BIOPSY COMPARISON:  Previous exam(s). FINDINGS: I met with the patient and we discussed the procedure of ultrasound-guided biopsy, including benefits and alternatives. We discussed the high likelihood of a successful procedure. We discussed the risks of the procedure, including infection, bleeding, tissue injury, clip migration, and inadequate sampling. Informed written consent was given. The usual time-out protocol was performed immediately prior to the procedure. Lesion quadrant: Upper-outer quadrant Using sterile technique and 1% Lidocaine as local anesthetic, under direct ultrasound visualization, a 12 gauge spring-loaded device was used to perform biopsy of an ill-defined left breast mass using a lateral approach. At the conclusion of the procedure a ribbon shaped tissue marker clip was deployed into the biopsy cavity. Using sterile technique and 1% Lidocaine as local anesthetic, under direct ultrasound visualization, a 14 gauge spring-loaded device was used to perform biopsy of a left axillary lymph node  using a lateral approach. At the conclusion of the procedure a HydroMARK tissue marker clip was deployed into the biopsy cavity. Follow up 2 view mammogram was performed and dictated separately. IMPRESSION: Ultrasound guided biopsy of the left breast and a left axillary lymph node. No apparent complications. Electronically Signed   By: Kristopher Oppenheim M.D.   On: 03/16/2018 14:09   [place] Rt Breast Bx W Loc Dev 1st Lesion Img Bx Spec US Guide  Addendum Date: 03/22/2018   ADDENDUM REPORT: 03/22/2018 07:27 ADDENDUM: Pathology revealed FIBROCYSTIC CHANGES, PSEUDOANGIOMATOUS STROMAL HYPERPLASIA ([place]), of the RIGHT breast, 6 o'clock, 3cmfn. FIBROCYSTIC CHANGES WITH FOCI OF CHRONIC INFLAMMATION of the RIGHT  breast, 9 o'clock. This was found to be concordant by Dr. Ammie Ferrier. Pathology results were discussed with the patient by telephone. The patient reported doing well after the biopsies with tenderness and minimal bleeding at the sites. Post biopsy instructions and care were reviewed and questions were answered. The patient was encouraged to call [facility] for any additional concerns. The patient has a recent diagnosis of LEFT breast cancer and should follow her outlined treatment plan. The patient was referred to [facility] on March 23, 2018. Recommendation for a bilateral breast MRI if breast conservation is a consideration for further evaluation of extent of disease due to heterogeneously dense breast tissue. Pathology results reported by Terie Purser, RN on 03/22/2018. Electronically Signed   By: Ammie Ferrier M.D.   On: 03/22/2018 07:27   Result Date: 03/22/2018 CLINICAL DATA:  47 year old female presenting for ultrasound-guided biopsy of 2 masses in the right breast. EXAM: ULTRASOUND GUIDED RIGHT BREAST CORE NEEDLE BIOPSY COMPARISON:  Previous exam(s). FINDINGS: I met with the patient and we discussed the procedure of ultrasound-guided biopsy, including benefits and alternatives. We discussed the high likelihood of a successful procedure. We discussed the risks of the procedure, including infection, bleeding, tissue injury, clip migration, and inadequate sampling. Informed written consent was given. The usual time-out protocol was performed immediately prior to the procedure. Lesion quadrant: Lower outer quadrant Using sterile technique and 1% Lidocaine as local anesthetic, under direct ultrasound visualization, a 14 gauge spring-loaded device was used to perform biopsy of a mass in the right breast at 6 o'clock using an inferior approach. At the conclusion of the procedure a ribbon shaped  tissue marker clip was deployed into the biopsy cavity. Lesion quadrant: Lower outer quadrant Using sterile technique and 1% Lidocaine as local anesthetic, under direct ultrasound visualization, a 14 gauge spring-loaded device was used to perform biopsy of a mass in the right breast at 9 o'clock using an inferior approach. At the conclusion of the procedure a coil shaped tissue marker clip was deployed into the biopsy cavity. Follow up 2 view mammogram was performed and dictated separately. IMPRESSION: 1. Ultrasound guided biopsy of a right breast mass at 6 o'clock. No apparent complications. 2. Ultrasound-guided biopsy of a right breast mass at 9 o'clock. No apparent complications. Electronically Signed: By: Ammie Ferrier M.D. On: 03/18/2018 10:43   [place] Rt Breast Bx W Loc Dev Ea Add Lesion Img Bx Spec US Guide  Addendum Date: 03/22/2018   ADDENDUM REPORT: 03/22/2018 07:27 ADDENDUM: Pathology revealed FIBROCYSTIC CHANGES, PSEUDOANGIOMATOUS STROMAL HYPERPLASIA ([place]), of the RIGHT breast, 6 o'clock, 3cmfn. FIBROCYSTIC CHANGES WITH FOCI OF CHRONIC INFLAMMATION of the RIGHT breast, 9 o'clock. This was found to be concordant by Dr. Ammie Ferrier. Pathology results were discussed  with the patient by telephone. The patient reported doing well after the biopsies with tenderness and minimal bleeding at the sites. Post biopsy instructions and care were reviewed and questions were answered. The patient was encouraged to call [facility] for any additional concerns. The patient has a recent diagnosis of LEFT breast cancer and should follow her outlined treatment plan. The patient was referred to [facility] on March 23, 2018. Recommendation for a bilateral breast MRI if breast conservation is a consideration for further evaluation of extent of disease due to heterogeneously dense breast tissue. Pathology results  reported by Terie Purser, RN on 03/22/2018. Electronically Signed   By: Ammie Ferrier M.D.   On: 03/22/2018 07:27   Result Date: 03/22/2018 CLINICAL DATA:  47 year old female presenting for ultrasound-guided biopsy of 2 masses in the right breast. EXAM: ULTRASOUND GUIDED RIGHT BREAST CORE NEEDLE BIOPSY COMPARISON:  Previous exam(s). FINDINGS: I met with the patient and we discussed the procedure of ultrasound-guided biopsy, including benefits and alternatives. We discussed the high likelihood of a successful procedure. We discussed the risks of the procedure, including infection, bleeding, tissue injury, clip migration, and inadequate sampling. Informed written consent was given. The usual time-out protocol was performed immediately prior to the procedure. Lesion quadrant: Lower outer quadrant Using sterile technique and 1% Lidocaine as local anesthetic, under direct ultrasound visualization, a 14 gauge spring-loaded device was used to perform biopsy of a mass in the right breast at 6 o'clock using an inferior approach. At the conclusion of the procedure a ribbon shaped tissue marker clip was deployed into the biopsy cavity. Lesion quadrant: Lower outer quadrant Using sterile technique and 1% Lidocaine as local anesthetic, under direct ultrasound visualization, a 14 gauge spring-loaded device was used to perform biopsy of a mass in the right breast at 9 o'clock using an inferior approach. At the conclusion of the procedure a coil shaped tissue marker clip was deployed into the biopsy cavity. Follow up 2 view mammogram was performed and dictated separately. IMPRESSION: 1. Ultrasound guided biopsy of a right breast mass at 6 o'clock. No apparent complications. 2. Ultrasound-guided biopsy of a right breast mass at 9 o'clock. No apparent complications. Electronically Signed: By: Ammie Ferrier M.D. On: 03/18/2018 10:43    ELIGIBLE FOR AVAILABLE RESEARCH PROTOCOL:  May eventually qualify for [id number];  UPBEAT  ASSESSMENT: 47 y.o. [place] woman status post left breast overlapping site biopsy and left axillary lymph node biopsy 03/16/2018 for a clinical T2 N3, stage IIIC invasive ductal carcinoma, triple negative, with an MIB-1 of 15%  (1) genetics testing pending  (2) neoadjuvant chemotherapy to consist of doxorubicin and cyclophosphamide in dose dense fashion x4 followed by weekly carboplatin and paclitaxel x12  (3) definitive surgery to follow  (4) adjuvant radiation  PLAN: I spent approximately 60 minutes face to face with Joycelyn Schmid with more than 50% of that time spent in counseling and coordination of care. Specifically we reviewed the biology of the patient's diagnosis and the specifics of her situation. We first reviewed the fact that cancer is not one disease but more than 100 different diseases and that it is important to keep them separate-- otherwise when friends and relatives discuss their own cancer experiences with Joycelyn Schmid confusion can result. Similarly we explained that if breast cancer spreads to the bone or liver, the patient would not have bone cancer or liver cancer, but breast cancer in the bone  and breast cancer in the liver: one cancer in three places-- not 3 different cancers which otherwise would have to be treated in 3 different ways.  We discussed the difference between local and systemic therapy. In terms of loco-regional treatment, lumpectomy plus radiation is equivalent to mastectomy as far as survival is concerned. For this reason, and because the cosmetic results are generally superior, our preference is to recommend breast conserving surgery.  However in Kebrina's case, if she were to have surgery now, she would need a modified radical mastectomy, followed by radiation.    We also noted that in terms of sequencing of treatments, whether systemic therapy or surgery is done first does not affect the ultimate outcome.  We do recommend that she start with systemic  therapy in an attempt to shrink the tumor as much as possible to make the surgery easier and possibly to save the breast if that is an option she wishes to pursue.  Note that she is considering bilateral mastectomies.  She knows that whether she has a mastectomy or lumpectomy she will need adjuvant radiation.  We then discussed the rationale for systemic therapy. There is some risk that this cancer may have already spread to other parts of her body.  In locally advanced disease as it is the situation with Joycelyn Schmid, we are proceeding to CT scans of the neck chest and bone scan.  I am hopeful that we will not document metastatic disease  Next we went over the options for systemic therapy which are anti-estrogens, anti-HER-2 immunotherapy, and chemotherapy. [place] does not meet criteria for anti-HER-2 immunotherapy or anti-estrogens.  Her only choice for systemic therapy is chemotherapy.  That is accordingly our recommendation.  We discussed how we determine what the optimal treatment is and briefly reviewed a series of studies that has established doxorubicin and cyclophosphamide in dose dense fashion followed by weekly paclitaxel with carboplatin as optimal neoadjuvant therapy in triple negative disease like hers.  We reviewed the possible toxicity side effects and complications of these agents and she will also come to "chemotherapy school" for further review.  The overall plan then is to start with chemotherapy, proceed to surgery, follow-up with radiation.  If there is significant residual disease she may qualify for the pembrolizumab study.  She does qualify for the upbeat study and I have alerted our research nurses regarding this.  She will need a port, an echocardiogram, staging studies, and then she will return to see me 04/01/2018 to discuss how to take her supportive medicines.  Our target start date is 04/07/2018.  Marisol has a good understanding of the overall plan. She agrees with it. She  knows the goal of treatment in her case is cure. She will call with any problems that may develop before her next visit here.  Chauncey Cruel, MD   03/23/2018 5:09 PM Medical Oncology and Hematology [facility] [address] [place], [place] [address] Tel. [phone]    Fax. [phone]

## 2018-03-24 ENCOUNTER — Telehealth: Payer: Self-pay | Admitting: Oncology

## 2018-03-24 NOTE — Telephone Encounter (Signed)
Per 10/23 no los

## 2018-03-25 ENCOUNTER — Telehealth: Payer: Self-pay | Admitting: *Deleted

## 2018-03-25 NOTE — Telephone Encounter (Signed)
Paula Berry [id number] Study- Dr. Jana Hakim referred patient for the Upbeat study.  I called patient and left message to introduce myself as the clinical research nurse and let her know I will try to meet to give her some information about this study when she is here next week after chemotherapy education class.  Gave my phone number and asked her to return my call if she wants to discuss the study prior to next week or if she has any other questions.  Thanked patient in advance for her time.  Foye Spurling, BSN, RN Clinical Research Nurse 03/25/2018 1:28 PM

## 2018-03-29 ENCOUNTER — Inpatient Hospital Stay: Payer: BC Managed Care – PPO

## 2018-03-29 ENCOUNTER — Encounter: Payer: Self-pay | Admitting: *Deleted

## 2018-03-29 ENCOUNTER — Ambulatory Visit (HOSPITAL_COMMUNITY)
Admission: RE | Admit: 2018-03-29 | Discharge: 2018-03-29 | Disposition: A | Discharge status: Home / Self Care | Payer: BC Managed Care – PPO | Source: Ambulatory Visit | Attending: Oncology | Admitting: Oncology

## 2018-03-29 ENCOUNTER — Other Ambulatory Visit: Payer: Self-pay

## 2018-03-29 ENCOUNTER — Encounter (HOSPITAL_COMMUNITY): Payer: Self-pay | Admitting: *Deleted

## 2018-03-29 ENCOUNTER — Ambulatory Visit (HOSPITAL_BASED_OUTPATIENT_CLINIC_OR_DEPARTMENT_OTHER)
Admission: RE | Admit: 2018-03-29 | Discharge: 2018-03-29 | Disposition: A | Discharge status: Home / Self Care | Payer: BC Managed Care – PPO | Source: Ambulatory Visit | Attending: Oncology | Admitting: Oncology

## 2018-03-29 DIAGNOSIS — Z171 Estrogen receptor negative status [ER-]: Secondary | ICD-10-CM

## 2018-03-29 DIAGNOSIS — J45909 Unspecified asthma, uncomplicated: Secondary | ICD-10-CM | POA: Insufficient documentation

## 2018-03-29 DIAGNOSIS — C50412 Malignant neoplasm of upper-outer quadrant of left female breast: Secondary | ICD-10-CM | POA: Insufficient documentation

## 2018-03-29 DIAGNOSIS — D649 Anemia, unspecified: Secondary | ICD-10-CM

## 2018-03-29 MED ORDER — GADOBUTROL 1 MMOL/ML IV SOLN
10.0000 mL | Freq: Once | INTRAVENOUS | Status: AC | PRN
  Administered 2018-03-29: 9 mL via INTRAVENOUS

## 2018-03-29 NOTE — Progress Notes (Signed)
Spoke with pt for pre-op call. Pt denies cardiac history, HTN or diabetes. Pt had an Echo done today, results are not in yet.  Pt instructed to not eat food after midnight but may have clear liquids until 10:30 AM. Pt voiced understanding.

## 2018-03-29 NOTE — Progress Notes (Signed)
  Echocardiogram 2D Echocardiogram has been performed.  Paula Berry F 03/29/2018, 2:21 PM

## 2018-03-29 NOTE — Research (Signed)
Dr. Magrinat referred patient for the Upbeat study; [id number], Understanding and Predicting Breast Cancer Events after Treatment.  Met with patient briefly after her chemotherapy education class.  Presented the study to patient, informing her of the purpose of the study and outlined the timeline for study activities. Explained that all baseline activities, including cardiac MRI need to be done prior to starting on treatment.  Informed patient that participation is completely voluntary. Patient states she cannot afford to take any more time off work to be able to participate in this study.  Informed patient that is understandable and thanked her very much for her time today.   Dr. Magrinat notified.  Paula Berry, BSN, RN Clinical Research Nurse 03/29/2018 12:17 PM  

## 2018-03-30 ENCOUNTER — Encounter (HOSPITAL_COMMUNITY)
Admission: RE | Disposition: A | Discharge status: Home / Self Care | Payer: Self-pay | Source: Ambulatory Visit | Attending: General Surgery

## 2018-03-30 ENCOUNTER — Encounter (HOSPITAL_COMMUNITY): Payer: Self-pay

## 2018-03-30 ENCOUNTER — Ambulatory Visit (HOSPITAL_COMMUNITY): Payer: BC Managed Care – PPO | Admitting: Certified Registered Nurse Anesthetist

## 2018-03-30 ENCOUNTER — Ambulatory Visit (HOSPITAL_COMMUNITY): Payer: BC Managed Care – PPO

## 2018-03-30 ENCOUNTER — Ambulatory Visit (HOSPITAL_COMMUNITY)
Admission: RE | Admit: 2018-03-30 | Discharge: 2018-03-30 | Disposition: A | Discharge status: Home / Self Care | Payer: BC Managed Care – PPO | Source: Ambulatory Visit | Attending: General Surgery | Admitting: General Surgery

## 2018-03-30 DIAGNOSIS — J45909 Unspecified asthma, uncomplicated: Secondary | ICD-10-CM | POA: Diagnosis not present

## 2018-03-30 DIAGNOSIS — Z419 Encounter for procedure for purposes other than remedying health state, unspecified: Secondary | ICD-10-CM

## 2018-03-30 DIAGNOSIS — Z171 Estrogen receptor negative status [ER-]: Secondary | ICD-10-CM | POA: Insufficient documentation

## 2018-03-30 DIAGNOSIS — Z90710 Acquired absence of both cervix and uterus: Secondary | ICD-10-CM | POA: Insufficient documentation

## 2018-03-30 DIAGNOSIS — Z95828 Presence of other vascular implants and grafts: Secondary | ICD-10-CM

## 2018-03-30 DIAGNOSIS — Z79899 Other long term (current) drug therapy: Secondary | ICD-10-CM | POA: Insufficient documentation

## 2018-03-30 DIAGNOSIS — C50912 Malignant neoplasm of unspecified site of left female breast: Secondary | ICD-10-CM | POA: Insufficient documentation

## 2018-03-30 HISTORY — PX: PORTACATH PLACEMENT: SHX2246

## 2018-03-30 HISTORY — DX: Malignant (primary) neoplasm, unspecified: C80.1

## 2018-03-30 SURGERY — INSERTION, TUNNELED CENTRAL VENOUS DEVICE, WITH PORT
Anesthesia: General | Site: Chest

## 2018-03-30 MED ORDER — SODIUM CHLORIDE 0.9% FLUSH: 3.0000 mL | Freq: Two times a day (BID) | INTRAVENOUS | Status: DC

## 2018-03-30 MED ORDER — GABAPENTIN 100 MG PO CAPS
100.0000 mg | ORAL_CAPSULE | ORAL | Status: AC
  Administered 2018-03-30: 100 mg via ORAL
  Filled 2018-03-30: qty 1

## 2018-03-30 MED ORDER — DEXAMETHASONE SODIUM PHOSPHATE 10 MG/ML IJ SOLN
INTRAMUSCULAR | Status: AC
  Filled 2018-03-30: qty 1

## 2018-03-30 MED ORDER — FENTANYL CITRATE (PF) 100 MCG/2ML IJ SOLN
INTRAMUSCULAR | Status: DC | PRN
  Administered 2018-03-30: 50 ug via INTRAVENOUS

## 2018-03-30 MED ORDER — ACETAMINOPHEN 500 MG PO TABS
1000.0000 mg | ORAL_TABLET | ORAL | Status: AC
  Administered 2018-03-30: 1000 mg via ORAL
  Filled 2018-03-30: qty 2

## 2018-03-30 MED ORDER — SODIUM CHLORIDE 0.9 % IV SOLN
INTRAVENOUS | Status: AC
  Filled 2018-03-30: qty 1.2

## 2018-03-30 MED ORDER — OXYCODONE HCL 5 MG PO TABS: 5.0000 mg | ORAL_TABLET | Freq: Once | ORAL | Status: DC | PRN

## 2018-03-30 MED ORDER — LIDOCAINE 2% (20 MG/ML) 5 ML SYRINGE
INTRAMUSCULAR | Status: AC
  Filled 2018-03-30: qty 5

## 2018-03-30 MED ORDER — OXYCODONE HCL 5 MG/5ML PO SOLN: 5.0000 mg | Freq: Once | ORAL | Status: DC | PRN

## 2018-03-30 MED ORDER — ACETAMINOPHEN 650 MG RE SUPP: 650.0000 mg | RECTAL | Status: DC | PRN

## 2018-03-30 MED ORDER — 0.9 % SODIUM CHLORIDE (POUR BTL) OPTIME
TOPICAL | Status: DC | PRN
  Administered 2018-03-30: 1000 mL

## 2018-03-30 MED ORDER — MIDAZOLAM HCL 2 MG/2ML IJ SOLN
INTRAMUSCULAR | Status: AC
  Filled 2018-03-30: qty 2

## 2018-03-30 MED ORDER — SODIUM CHLORIDE 0.9 % IV SOLN: INTRAVENOUS | Status: DC

## 2018-03-30 MED ORDER — OXYCODONE HCL 5 MG PO TABS: 5.0000 mg | ORAL_TABLET | Freq: Four times a day (QID) | ORAL | 0 refills | Status: DC | PRN

## 2018-03-30 MED ORDER — MORPHINE SULFATE (PF) 2 MG/ML IV SOLN: 2.0000 mg | INTRAVENOUS | Status: DC | PRN

## 2018-03-30 MED ORDER — CEFAZOLIN SODIUM-DEXTROSE 2-4 GM/100ML-% IV SOLN
2.0000 g | INTRAVENOUS | Status: AC
  Administered 2018-03-30: 2 g via INTRAVENOUS
  Filled 2018-03-30: qty 100

## 2018-03-30 MED ORDER — ONDANSETRON HCL 4 MG/2ML IJ SOLN
INTRAMUSCULAR | Status: AC
  Filled 2018-03-30: qty 2

## 2018-03-30 MED ORDER — LIDOCAINE HCL (CARDIAC) PF 100 MG/5ML IV SOSY
PREFILLED_SYRINGE | INTRAVENOUS | Status: DC | PRN
  Administered 2018-03-30: 100 mg via INTRAVENOUS

## 2018-03-30 MED ORDER — ENSURE PRE-SURGERY PO LIQD
296.0000 mL | Freq: Once | ORAL | Status: DC
  Filled 2018-03-30: qty 296

## 2018-03-30 MED ORDER — LACTATED RINGERS IV SOLN
INTRAVENOUS | Status: DC
  Administered 2018-03-30: 13:00:00 via INTRAVENOUS

## 2018-03-30 MED ORDER — MIDAZOLAM HCL 2 MG/2ML IJ SOLN
INTRAMUSCULAR | Status: DC | PRN
  Administered 2018-03-30: 2 mg via INTRAVENOUS

## 2018-03-30 MED ORDER — FENTANYL CITRATE (PF) 250 MCG/5ML IJ SOLN
INTRAMUSCULAR | Status: AC
  Filled 2018-03-30: qty 5

## 2018-03-30 MED ORDER — ONDANSETRON HCL 4 MG/2ML IJ SOLN: INTRAMUSCULAR | Status: DC | PRN

## 2018-03-30 MED ORDER — MEPERIDINE HCL 50 MG/ML IJ SOLN: 6.2500 mg | INTRAMUSCULAR | Status: DC | PRN

## 2018-03-30 MED ORDER — PHENYLEPHRINE HCL 10 MG/ML IJ SOLN
INTRAMUSCULAR | Status: DC | PRN
  Administered 2018-03-30: 80 ug via INTRAVENOUS

## 2018-03-30 MED ORDER — SODIUM CHLORIDE 0.9% FLUSH: 3.0000 mL | INTRAVENOUS | Status: DC | PRN

## 2018-03-30 MED ORDER — PHENYLEPHRINE 40 MCG/ML (10ML) SYRINGE FOR IV PUSH (FOR BLOOD PRESSURE SUPPORT)
PREFILLED_SYRINGE | INTRAVENOUS | Status: AC
  Filled 2018-03-30: qty 10

## 2018-03-30 MED ORDER — SODIUM CHLORIDE 0.9 % IV SOLN
INTRAVENOUS | Status: DC | PRN
  Administered 2018-03-30: 14:00:00

## 2018-03-30 MED ORDER — PROMETHAZINE HCL 25 MG/ML IJ SOLN
6.2500 mg | INTRAMUSCULAR | Status: DC | PRN
  Administered 2018-03-30: 6.25 mg via INTRAVENOUS

## 2018-03-30 MED ORDER — DEXAMETHASONE SODIUM PHOSPHATE 10 MG/ML IJ SOLN
INTRAMUSCULAR | Status: DC | PRN
  Administered 2018-03-30: 10 mg via INTRAVENOUS

## 2018-03-30 MED ORDER — OXYCODONE HCL 5 MG PO TABS: 5.0000 mg | ORAL_TABLET | ORAL | Status: DC | PRN

## 2018-03-30 MED ORDER — BUPIVACAINE HCL (PF) 0.25 % IJ SOLN
INTRAMUSCULAR | Status: AC
  Filled 2018-03-30: qty 30

## 2018-03-30 MED ORDER — HEPARIN SOD (PORK) LOCK FLUSH 100 UNIT/ML IV SOLN
INTRAVENOUS | Status: AC
  Filled 2018-03-30: qty 5

## 2018-03-30 MED ORDER — PROPOFOL 10 MG/ML IV BOLUS
INTRAVENOUS | Status: DC | PRN
  Administered 2018-03-30: 200 mg via INTRAVENOUS

## 2018-03-30 MED ORDER — SODIUM CHLORIDE 0.9 % IV SOLN: 250.0000 mL | INTRAVENOUS | Status: DC | PRN

## 2018-03-30 MED ORDER — ACETAMINOPHEN 325 MG PO TABS: 650.0000 mg | ORAL_TABLET | ORAL | Status: DC | PRN

## 2018-03-30 MED ORDER — ACETAMINOPHEN 500 MG PO TABS: 1000.0000 mg | ORAL_TABLET | Freq: Four times a day (QID) | ORAL | Status: DC

## 2018-03-30 MED ORDER — BUPIVACAINE HCL 0.25 % IJ SOLN
INTRAMUSCULAR | Status: DC | PRN
  Administered 2018-03-30: 6 mL

## 2018-03-30 MED ORDER — ONDANSETRON HCL 4 MG/2ML IJ SOLN
INTRAMUSCULAR | Status: DC | PRN
  Administered 2018-03-30: 4 mg via INTRAVENOUS

## 2018-03-30 MED ORDER — FENTANYL CITRATE (PF) 100 MCG/2ML IJ SOLN: 25.0000 ug | INTRAMUSCULAR | Status: DC | PRN

## 2018-03-30 MED ORDER — PROPOFOL 10 MG/ML IV BOLUS
INTRAVENOUS | Status: AC
  Filled 2018-03-30: qty 20

## 2018-03-30 MED ORDER — PROMETHAZINE HCL 25 MG/ML IJ SOLN
INTRAMUSCULAR | Status: AC
  Administered 2018-03-30: 6.25 mg via INTRAVENOUS
  Filled 2018-03-30: qty 1

## 2018-03-30 MED ORDER — HEPARIN SOD (PORK) LOCK FLUSH 100 UNIT/ML IV SOLN
INTRAVENOUS | Status: DC | PRN
  Administered 2018-03-30: 400 [IU] via INTRAVENOUS

## 2018-03-30 SURGICAL SUPPLY — 44 items
ADH SKN CLS APL DERMABOND .7 (GAUZE/BANDAGES/DRESSINGS) ×1
BAG DECANTER FOR FLEXI CONT (MISCELLANEOUS) ×3 IMPLANT
CHLORAPREP W/TINT 10.5 ML (MISCELLANEOUS) ×3 IMPLANT
CLOSURE WOUND 1/2 X4 (GAUZE/BANDAGES/DRESSINGS) ×1
COVER SURGICAL LIGHT HANDLE (MISCELLANEOUS) ×3 IMPLANT
COVER TRANSDUCER ULTRASND GEL (DRAPE) ×3 IMPLANT
COVER WAND RF STERILE (DRAPES) ×1 IMPLANT
CRADLE DONUT ADULT HEAD (MISCELLANEOUS) ×3 IMPLANT
DECANTER SPIKE VIAL GLASS SM (MISCELLANEOUS) ×3 IMPLANT
DERMABOND ADVANCED (GAUZE/BANDAGES/DRESSINGS) ×2
DERMABOND ADVANCED .7 DNX12 (GAUZE/BANDAGES/DRESSINGS) ×1 IMPLANT
DRAPE C-ARM 42X72 X-RAY (DRAPES) ×3 IMPLANT
DRAPE CHEST BREAST 15X10 FENES (DRAPES) ×3 IMPLANT
ELECT CAUTERY BLADE 6.4 (BLADE) ×3 IMPLANT
ELECT REM PT RETURN 9FT ADLT (ELECTROSURGICAL) ×3
ELECTRODE REM PT RTRN 9FT ADLT (ELECTROSURGICAL) ×1 IMPLANT
GAUZE 4X4 16PLY RFD (DISPOSABLE) ×3 IMPLANT
GEL ULTRASOUND 20GR AQUASONIC (MISCELLANEOUS) ×3 IMPLANT
GLOVE BIO SURGEON STRL SZ7 (GLOVE) ×3 IMPLANT
GLOVE BIOGEL PI IND STRL 7.5 (GLOVE) ×1 IMPLANT
GLOVE BIOGEL PI INDICATOR 7.5 (GLOVE) ×2
GOWN STRL REUS W/ TWL LRG LVL3 (GOWN DISPOSABLE) ×2 IMPLANT
GOWN STRL REUS W/TWL LRG LVL3 (GOWN DISPOSABLE) ×6
INTRODUCER COOK 11FR (CATHETERS) IMPLANT
KIT BASIN OR (CUSTOM PROCEDURE TRAY) ×3 IMPLANT
KIT PORT POWER 8FR ISP CVUE (Port) ×3 IMPLANT
KIT TURNOVER KIT B (KITS) ×3 IMPLANT
NS IRRIG 1000ML POUR BTL (IV SOLUTION) ×3 IMPLANT
PAD ARMBOARD 7.5X6 YLW CONV (MISCELLANEOUS) ×6 IMPLANT
PENCIL BUTTON HOLSTER BLD 10FT (ELECTRODE) ×3 IMPLANT
SET INTRODUCER 12FR PACEMAKER (INTRODUCER) IMPLANT
SET SHEATH INTRODUCER 10FR (MISCELLANEOUS) IMPLANT
SHEATH COOK PEEL AWAY SET 9F (SHEATH) IMPLANT
STRIP CLOSURE SKIN 1/2X4 (GAUZE/BANDAGES/DRESSINGS) ×1 IMPLANT
SUT MNCRL AB 4-0 PS2 18 (SUTURE) ×3 IMPLANT
SUT PROLENE 2 0 SH DA (SUTURE) ×3 IMPLANT
SUT SILK 2 0 (SUTURE)
SUT SILK 2-0 18XBRD TIE 12 (SUTURE) IMPLANT
SUT VIC AB 3-0 SH 27 (SUTURE) ×3
SUT VIC AB 3-0 SH 27XBRD (SUTURE) ×1 IMPLANT
SYR 5ML LUER SLIP (SYRINGE) ×3 IMPLANT
TOWEL OR 17X24 6PK STRL BLUE (TOWEL DISPOSABLE) ×3 IMPLANT
TOWEL OR 17X26 10 PK STRL BLUE (TOWEL DISPOSABLE) ×3 IMPLANT
TRAY LAPAROSCOPIC MC (CUSTOM PROCEDURE TRAY) ×3 IMPLANT

## 2018-03-30 NOTE — Anesthesia Procedure Notes (Signed)
Procedure Name: LMA Insertion Date/Time: 03/30/2018 2:24 PM Performed by: Inda Coke, CRNA Pre-anesthesia Checklist: Patient identified, Emergency Drugs available, Suction available and Patient being monitored Patient Re-evaluated:Patient Re-evaluated prior to induction Oxygen Delivery Method: Circle System Utilized Preoxygenation: Pre-oxygenation with 100% oxygen Induction Type: IV induction Ventilation: Mask ventilation without difficulty LMA: LMA inserted LMA Size: 4.0 Number of attempts: 1 Airway Equipment and Method: Bite block Placement Confirmation: positive ETCO2 Tube secured with: Tape Dental Injury: Teeth and Oropharynx as per pre-operative assessment

## 2018-03-30 NOTE — Anesthesia Preprocedure Evaluation (Signed)
Anesthesia Evaluation  Patient identified by MRN, date of birth, ID band Patient awake    Reviewed: Allergy & Precautions, H&P , NPO status , Patient's Chart, lab work & pertinent test results  Airway Mallampati: II  TM Distance: >3 FB Neck ROM: full    Dental no notable dental hx. (+) Teeth Intact   Pulmonary asthma , pneumonia,  Will use Albuterol inhaler prior to OR.   Pulmonary exam normal        Cardiovascular negative cardio ROS Normal cardiovascular exam Rhythm:Regular     Neuro/Psych  Headaches, negative psych ROS   GI/Hepatic Neg liver ROS, GERD  Medicated and Controlled,  Endo/Other  negative endocrine ROS  Renal/GU negative Renal ROS     Musculoskeletal   Abdominal (+) + obese,   Peds  Hematology  (+) anemia ,   Anesthesia Other Findings   Reproductive/Obstetrics negative OB ROS                             Anesthesia Physical  Anesthesia Plan  ASA: II  Anesthesia Plan: General   Post-op Pain Management:    Induction: Intravenous  PONV Risk Score and Plan: 3 and Ondansetron, Dexamethasone, Midazolam and Treatment may vary due to age or medical condition  Airway Management Planned: LMA  Additional Equipment: None  Intra-op Plan:   Post-operative Plan: Extubation in OR  Informed Consent: I have reviewed the patients History and Physical, chart, labs and discussed the procedure including the risks, benefits and alternatives for the proposed anesthesia with the patient or authorized representative who has indicated his/her understanding and acceptance.   Dental advisory given  Plan Discussed with: CRNA  Anesthesia Plan Comments:         Anesthesia Quick Evaluation

## 2018-03-30 NOTE — H&P (Signed)
The patient is a 47 year old female. The patient had a left mammogram with tomography and ultrasonography 07/12/2015 at the [name] after screening recall for a possible left breast mass.  This found the breast density to be category C.  There was a small lobulated mass in the lower outer aspect of the left breast, which was not palpable.  Ultrasound showed a small lobulated cyst in that area measuring 0.6 cm.  This was felt to be benign.  In July 2019 she had screening mammography at Dr. Clementeen Hoof office.  I do not have that report but according to the patient it was unremarkable.  In October 2019 however the patient developed left axillary swelling and tenderness and was again set up for left mammography with tomography and left breast ultrasonography at the [facility], 03/11/2018.  Breast density was category C.  Mammography showed numerous markedly enlarged left axillary lymph nodes.  There was periareolar skin thickening.  There was an area of ill-defined distortion in the upper outer left breast and on physical exam there was a firm lump superior and lateral to the left nipple, with swelling of the left axilla.  Ultrasound of the left breast and both upper quadrants found an ill-defined irregular hypoechoic area measuring at least 4.2 cm.  There also multiple scattered cysts.  Ultrasound of the left axilla showed at least 6 abnormal lymph nodes.  On 03/16/2018 the patient underwent right mammography with tomography and right breast ultrasonography at the [facility].  Again breast density was category C.  There were multiple circumscribed equal density masses in the upper outer quadrant of the right breast which appeared stable.  Ultrasound showed diffuse fibrocystic changes.  An additional hypoechoic mass was noted at the 9 o'clock position 3 cm from the nipple measuring 0.8 cm, with no associated vascularity.  A complex solid and cystic mass was noted superficially at the 6 o'clock  position 3 cm from the nipple measuring 1.6 cm.  Evaluation of the right axilla was negative.  On 03/18/2018 the patient underwent biopsy of the 2 suspicious areas in the right breast, and they both showed only fibrocystic changes with some chronic inflammation ([id number]).  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   Past Surgical History Tawni Pummel, RN; 03/23/2018 7:34 AM) Breast Biopsy  Bilateral. Hysterectomy (not due to cancer) - Partial  Thyroid Surgery   Diagnostic Studies History Tawni Pummel, RN; 03/23/2018 7:34 AM) Colonoscopy  never Mammogram  within last year Pap Smear  1-5 years ago  Medication History Tawni Pummel, RN; 03/23/2018 7:34 AM) Medications Reconciled  Social History Tawni Pummel, RN; 03/23/2018 7:34 AM) Alcohol use  Occasional alcohol use. Caffeine use  Coffee. No drug use  Tobacco use  Never smoker.  Family History Tawni Pummel, RN; 03/23/2018 7:34 AM) First Degree Relatives  No pertinent family history   Other Problems Tawni Pummel, RN; 03/23/2018 7:34 AM) Asthma  Gastroesophageal Reflux Disease  Lump In Breast     Review of Systems Sunday Spillers Ledford RN; 03/23/2018 7:34 AM) General Present- Fatigue. Not Present- Appetite Loss, Chills, Fever, Night Sweats, Weight Gain and Weight Loss. Skin Not Present- Change in Wart/Mole, Dryness, Hives, Jaundice, New Lesions, Non-Healing Wounds, Rash and Ulcer. HEENT Present- Seasonal Allergies and Sinus Pain. Not Present- Earache, Hearing Loss, Hoarseness,  Nose Bleed, Oral Ulcers, Ringing in the Ears, Sore Throat, Visual Disturbances, Wears glasses/contact lenses and Yellow Eyes. Respiratory Not Present- Bloody sputum, Chronic Cough, Difficulty Breathing,  Snoring and Wheezing. Breast Present- Breast Pain. Not Present- Breast Mass, Nipple Discharge and Skin Changes. Cardiovascular Not Present- Chest Pain, Difficulty Breathing Lying Down, Leg Cramps, Palpitations, Rapid Heart Rate, Shortness of Breath and Swelling of Extremities. Gastrointestinal Not Present- Abdominal Pain, Bloating, Bloody Stool, Change in Bowel Habits, Chronic diarrhea, Constipation, Difficulty Swallowing, Excessive gas, Gets full quickly at meals, Hemorrhoids, Indigestion, Nausea, Rectal Pain and Vomiting. Female Genitourinary Not Present- Frequency, Nocturia, Painful Urination, Pelvic Pain and Urgency. Musculoskeletal Not Present- Back Pain, Joint Pain, Joint Stiffness, Muscle Pain, Muscle Weakness and Swelling of Extremities. Neurological Not Present- Decreased Memory, Fainting, Headaches, Numbness, Seizures, Tingling, Tremor, Trouble walking and Weakness. Psychiatric Not Present- Anxiety, Bipolar, Change in Sleep Pattern, Depression, Fearful and Frequent crying. Endocrine Not Present- Cold Intolerance, Excessive Hunger, Hair Changes, Heat Intolerance, Hot flashes and New Diabetes. Hematology Not Present- Blood Thinners, Easy Bruising, Excessive bleeding, Gland problems, HIV and Persistent Infections.   Physical Exam Rolm Bookbinder MD; 03/23/2018 12:48 PM) General Mental Status-Alert.  Head and Neck Trachea-midline. Thyroid Gland Characteristics - normal size and consistency.  Eye Sclera/Conjunctiva - Bilateral-No scleral icterus.  Chest and Lung Exam Chest and lung exam reveals -quiet, even and easy respiratory effort with no use of accessory muscles and on auscultation, normal breath sounds, no adventitious sounds and normal vocal resonance.  Breast Nipples-No Discharge. Note: uoq vague mass with hematoma   Cardiovascular Cardiovascular examination reveals -normal heart sounds, regular rate and rhythm with no murmurs.  Neurologic Neurologic  evaluation reveals -alert and oriented x 3 with no impairment of recent or remote memory.  Lymphatic Head & Neck  General Head & Neck Lymphatics: Bilateral - Description - Normal. Axillary  General Axillary Region: Right - Description - Normal. Note: bulky fixed left axillary lad Note: palpable left  adenopathy     Assessment & Plan Rolm Bookbinder MD; 03/23/2018 12:50 PM) STAGE III BREAST CANCER IN FEMALE (C50.919) Story: Genetics, port placement for primary chemotherapy, MRI, staging scans I think risk of stage IV disease is quite high. No matter if IIIC or IV treatment first will be chemotherapy with possible surgery down the road depending on other testing. discussed port placement and will do next week. will place in right ij discussed alnd and mastectomy vs lumpectomy after chemotherapy assuming no stage IV disease

## 2018-03-30 NOTE — Transfer of Care (Signed)
Immediate Anesthesia Transfer of Care Note  Patient: Paula Berry  Procedure(s) Performed: INSERTION PORT-A-CATH WITH [place] (N/A Chest)  Patient Location: PACU  Anesthesia Type:General  Level of Consciousness: drowsy  Airway & Oxygen Therapy: Patient Spontanous Breathing  Post-op Assessment: Report given to RN and Post -op Vital signs reviewed and stable  Post vital signs: Reviewed and stable  Last Vitals:  Vitals Value Taken Time  BP 122/81 03/30/2018  3:09 PM  Temp    Pulse 81 03/30/2018  3:11 PM  Resp 14 03/30/2018  3:11 PM  SpO2 98 % 03/30/2018  3:11 PM  Vitals shown include unvalidated device data.  Last Pain:  Vitals:   03/30/18 1509  TempSrc:   PainSc: (P) 0-No pain         Complications: No apparent anesthesia complications

## 2018-03-30 NOTE — Op Note (Signed)
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  She is due for staging scans still.  She is due to begin chemotherapy. We discussed port placement.  Procedure: After informed consent was obtained the patient was taken to the operating room. She was given antibiotics. Sequential compression devices were on her legs. She was then placed under general anesthesia with an LMA. Then she was prepped and draped in the standard sterile surgical fashion. Surgical timeout was then performed.  Ithenused the ultrasound to identify the right internal jugular vein. I then accessed the vein using the ultrasound.This aspirated blood. I then placed the wire. This was confirmed by fluoroscopy and ultrasound to be in the correct position.I tunneled the line between the 2 sites.I then dilated the tract and placed the dilator assembly with the sheath. This was done under fluoroscopy. I then removed the sheath and dilator. The wire was also removed. The line was then pulled back to be in the venacava. I hooked this up to the port. I sutured this into place with 2-0 Prolene in 2 places. This aspirated blood and flushed easily.This was confirmed with a final fluoroscopy. I then closed this with 2-0 Vicryl and 4-0 Monocryl.This withdrew blood and I placed heparin in it.Dermabond was placed on both the incisions.A dressing was placed. She tolerated this well and was transferred to the recovery room in stable condition

## 2018-03-30 NOTE — Discharge Instructions (Signed)
    PORT-A-CATH: POST OP INSTRUCTIONS  Always review your discharge instruction sheet given to you by the facility where your surgery was performed.   1. A prescription for pain medication may be given to you upon discharge. Take your pain medication as prescribed, if needed. If narcotic pain medicine is not needed, then you make take acetaminophen (Tylenol) or ibuprofen (Advil) as needed.  2. Take your usually prescribed medications unless otherwise directed. 3. If you need a refill on your pain medication, please contact our office. All narcotic pain medicine now requires a paper prescription.  Phoned in and fax refills are no longer allowed by law.  Prescriptions will not be filled after 5 pm or on weekends.  4. You should follow a light diet for the remainder of the day after your procedure. 5. Most patients will experience some mild swelling and/or bruising in the area of the incision. It may take several days to resolve. 6. It is common to experience some constipation if taking pain medication after surgery. Increasing fluid intake and taking a stool softener (such as Colace) will usually help or prevent this problem from occurring. A mild laxative (Milk of Magnesia or Miralax) should be taken according to package directions if there are no bowel movements after 48 hours.  7. Unless discharge instructions indicate otherwise, you may remove your bandages 48 hours after surgery, and you may shower at that time. You may have steri-strips (small white skin tapes) in place directly over the incision.  These strips should be left on the skin for 7-10 days.  If your surgeon used Dermabond (skin glue) on the incision, you may shower in 24 hours.  The glue will flake off over the next 2-3 weeks.  8. If your port is left accessed at the end of surgery (needle left in port), the dressing cannot get wet and should only by changed by a healthcare professional. When the port is no longer accessed (when the  needle has been removed), follow step 7.   9. ACTIVITIES:  Limit activity involving your arms for the next 72 hours. Do no strenuous exercise or activity for 1 week. You may drive when you are no longer taking prescription pain medication, you can comfortably wear a seatbelt, and you can maneuver your car. 10.You may need to see your doctor in the office for a follow-up appointment.  Please       check with your doctor.  11.When you receive a new Port-a-Cath, you will get a product guide and        ID card.  Please keep them in case you need them.  WHEN TO CALL YOUR DOCTOR ([phone]): 1. Fever over 101.0 2. Chills 3. Continued bleeding from incision 4. Increased redness and tenderness at the site 5. Shortness of breath, difficulty breathing   The clinic staff is available to answer your questions during regular business hours. Please don't hesitate to call and ask to speak to one of the nurses or medical assistants for clinical concerns. If you have a medical emergency, go to the nearest emergency room or call 911.  A surgeon from [facility] is always on call at the hospital.     For further information, please visit [email or web address]      

## 2018-03-30 NOTE — Interval H&P Note (Signed)
History and Physical Interval Note:  03/30/2018 1:02 PM  Paula Berry  has presented today for surgery, with the diagnosis of BREAST CANCER  The various methods of treatment have been discussed with the patient and family. After consideration of risks, benefits and other options for treatment, the patient has consented to  Procedure(s): INSERTION PORT-A-CATH WITH [place] (N/A) as a surgical intervention .  The patient's history has been reviewed, patient examined, no change in status, stable for surgery.  I have reviewed the patient's chart and labs.  Questions were answered to the patient's satisfaction.     Rolm Bookbinder

## 2018-03-30 NOTE — Anesthesia Postprocedure Evaluation (Signed)
Anesthesia Post Note  Patient: Paula Berry  Procedure(s) Performed: INSERTION PORT-A-CATH WITH [place] (N/A Chest)     Patient location during evaluation: PACU Anesthesia Type: General Level of consciousness: awake and alert Pain management: pain level controlled Vital Signs Assessment: post-procedure vital signs reviewed and stable Respiratory status: spontaneous breathing, nonlabored ventilation, respiratory function stable and patient connected to nasal cannula oxygen Cardiovascular status: blood pressure returned to baseline and stable Postop Assessment: no apparent nausea or vomiting Anesthetic complications: no    Last Vitals:  Vitals:   03/30/18 1545 03/30/18 1600  BP: 118/71 116/77  Pulse: 68 72  Resp: 12 14  Temp:  (!) 36.1 C  SpO2: 97% 100%    Last Pain:  Vitals:   03/30/18 1600  TempSrc:   PainSc: 0-No pain                 Montez Hageman

## 2018-03-31 ENCOUNTER — Encounter (HOSPITAL_COMMUNITY): Payer: Self-pay | Admitting: General Surgery

## 2018-03-31 NOTE — Progress Notes (Signed)
[place]  [phone] Fax:(336) [phone]     ID: Paula Berry DOB: 1970/07/28  MR#: [id number]  QMV#:[id number]  Patient Care Team: Vernie Shanks, MD as PCP - General (Family Medicine) Rolm Bookbinder, MD as Consulting Physician (General Surgery) Jalia Zuniga, Virgie Dad, MD as Consulting Physician (Oncology) Eppie Gibson, MD as Attending Physician (Radiation Oncology) Everlene Farrier, MD as Consulting Physician (Obstetrics and Gynecology) Chauncey Cruel, MD OTHER MD:  CHIEF COMPLAINT: Triple negative breast cancer  CURRENT TREATMENT: Neoadjuvant chemotherapy   HISTORY OF CURRENT ILLNESS: From the original intake note:  The patient had a left mammogram with tomography and ultrasonography 07/12/2015 at the [facility] after screening recall for a possible left breast mass.  This found the breast density to be category C.  There was a small lobulated mass in the lower outer aspect of the left breast, which was not palpable.  Ultrasound showed a small lobulated cyst in that area measuring 0.6 cm.  This was felt to be benign.  In July 2019 she had screening mammography at Dr. Clementeen Hoof office.  I do not have that report but according to the patient it was unremarkable.  In October 2019 however the patient developed left axillary swelling and tenderness and was again set up for left mammography with tomography and left breast ultrasonography at the [facility], 03/11/2018.  Breast density was category C.  Mammography showed numerous markedly enlarged left axillary lymph nodes.  There was periareolar skin thickening.  There was an area of ill-defined distortion in the upper outer left breast and on physical exam there was a firm lump superior and lateral to the left nipple, with swelling of the left axilla.  Ultrasound of the left breast and both upper quadrants found an ill-defined irregular hypoechoic area measuring at least 4.2 cm.  There  also multiple scattered cysts.  Ultrasound of the left axilla showed at least 6 abnormal lymph nodes.  On 03/16/2018 the patient underwent right mammography with tomography and right breast ultrasonography at the [facility].  Again breast density was category C.  There were multiple circumscribed equal density masses in the upper outer quadrant of the right breast which appeared stable.  Ultrasound showed diffuse fibrocystic changes.  An additional hypoechoic mass was noted at the 9 o'clock position 3 cm from the nipple measuring 0.8 cm, with no associated vascularity.  A complex solid and cystic mass was noted superficially at the 6 o'clock position 3 cm from the nipple measuring 1.6 cm.  Evaluation of the right axilla was negative.  On 03/18/2018 the patient underwent biopsy of the 2 suspicious areas in the right breast, and they both showed only fibrocystic changes with some chronic inflammation ([id number]).  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  The patient's subsequent history is as detailed below.  INTERVAL HISTORY: Paula Berry returns to the breast cancer clinic on 04/01/2018 accompanied by her husband.   Since our last visit, she had a breast MRI on 03/29/2018 that revealed biopsy proven malignancy in the upper outer anterior left breast measures up to 4.3 cm with several additional masses and smaller satellite lesions extending posterior to the dominant mass, with total extent of disease measuring nearly 10 cm. Bulky left axillary lymphadenopathy with large enhancing mass likely extending to  the level of the clavicle although incompletely imaged. There is also abnormal masslike enhancement located along the posterior margin of the left pectoralis muscle possibly  related to the pectoralis muscle or within a rib. Two small indeterminate enhancing masses in the central toslightly lower outer right breast.  Echocardiogram on 03/29/2018 showed LV EF: 60% -   65%.  She is also noted to have had a port inserted by Dr. [name] on 03/30/2018.     REVIEW OF SYSTEMS: She is doing well with the port. She had mild tenderness at the site, for which she took Tylenol. She had pain yesterday, but feels good today. She has seen Dr. Emmie Niemann, and states that she prefers double mastectomy instead of single. She walks for exercise. She is interested in yoga and [place]  There has been no swelling of the left upper extremity the patient denies unusual headaches, visual changes, nausea, vomiting, stiff neck, dizziness, or gait imbalance. There has been no cough, phlegm production, or pleurisy, no chest pain or pressure, and no change in bowel or bladder habits. The patient denies fever, rash, bleeding, unexplained fatigue or unexplained weight loss. A detailed review of systems was otherwise entirely negative.   PAST MEDICAL HISTORY: Past Medical History:  Diagnosis Date  . Anemia    had iron infusion 08/12/15  . Asthma   . Cancer [facility])    Breast cancer   . Genital herpes   . GERD (gastroesophageal reflux disease)   . Headache    migraines 2-3 times monthly  . Pneumonia    hospitalizied for pneumonia as a child    PAST SURGICAL HISTORY: Past Surgical History:  Procedure Laterality Date  . ABDOMINAL HYSTERECTOMY Bilateral 08/22/2015   Procedure: HYSTERECTOMY ABDOMINAL, BILATERAL SALPINGECTOMY;  Surgeon: Everlene Farrier, MD;  Location: [facility];  Service: Gynecology;  Laterality: Bilateral;  . MYOMECTOMY ABDOMINAL APPROACH    . PORTACATH PLACEMENT N/A 03/30/2018   Procedure: INSERTION PORT-A-CATH WITH [place];  Surgeon: Rolm Bookbinder, MD;  Location: [facility];  Service: General;  Laterality: N/A;    FAMILY HISTORY Family History  Problem Relation Age of Onset  .  Hypertension Unknown   . Diabetes Unknown   . Cancer Unknown   . Dementia Maternal Grandfather   . Lung cancer Paternal Grandmother   . Brain cancer Paternal Aunt   . Migraines Neg Hx   The patient's parents are in their early 6s as of October 2019.  A paternal aunt had brain cancer at age 55.  A paternal grandmother had lung cancer at age 73.  There is no breast or ovarian cancer history in the family.  However note that the patient has little information on her mother side of the family.  GYNECOLOGIC HISTORY:  No LMP recorded. Patient has had a hysterectomy. 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  Her husband [name]. works for the [employer].  He is a former patient here, with stage IV colon cancer.  They live alone, with no pets.    ADVANCED DIRECTIVES: Not in place   HEALTH MAINTENANCE: Social History   Tobacco Use  . Smoking status: Never Smoker  . Smokeless tobacco: Never Used  Substance Use Topics  . Alcohol use: Yes  Alcohol/week: 1.0 standard drinks    Types: 1 Glasses of wine per week    Comment: daily  . Drug use: No     Colonoscopy: Never  PAP: Status post hysterectomy  Bone density: Never   Allergies  Allergen Reactions  . Neosporin [Neomycin-Bacitracin Zn-Polymyx] Other (See Comments)    Decreased wound healing with blisters    Current Outpatient Medications  Medication Sig Dispense Refill  . acetaminophen (TYLENOL) 500 MG tablet Take 1,000 mg by mouth every 8 (eight) hours as needed for moderate pain.    Marland Kitchen acyclovir ointment (ZOVIRAX) 5 % Apply 1 application topically 4 (four) times daily as needed (outbreaks).   2  . albuterol (PROVENTIL HFA;VENTOLIN HFA) 108 (90 Base)  MCG/ACT inhaler Inhale 2 puffs into the lungs every 6 (six) hours as needed for wheezing or shortness of breath.     . Chlorphen-Phenyleph-ASA (ALKA-SELTZER PLUS COLD PO) Take 1-2 tablets by mouth daily as needed (allergies).    Marland Kitchen dexamethasone (DECADRON) 4 MG tablet Take 2 tablets by mouth once a day on the day after chemotherapy and then take 2 tablets two times a day for 2 days. Take with food. 30 tablet 1  . lidocaine-prilocaine (EMLA) cream Apply to affected area once 30 g 3  . LORazepam (ATIVAN) 0.5 MG tablet Take 1 tablet (0.5 mg total) by mouth at bedtime as needed (Nausea or vomiting). 30 tablet 0  . methocarbamol (ROBAXIN) 500 MG tablet Take 500 mg by mouth 3 (three) times daily as needed for muscle spasms (pain).    . montelukast (SINGULAIR) 10 MG tablet Take 10 mg by mouth every evening.    Marland Kitchen oxyCODONE (OXY IR/ROXICODONE) 5 MG immediate release tablet Take 1 tablet (5 mg total) by mouth every 6 (six) hours as needed for moderate pain, severe pain or breakthrough pain. 8 tablet 0  . pantoprazole (PROTONIX) 40 MG tablet Take 40 mg by mouth every evening.    . prochlorperazine (COMPAZINE) 10 MG tablet Take 1 tablet (10 mg total) by mouth every 6 (six) hours as needed (Nausea or vomiting). 30 tablet 1  . valACYclovir (VALTREX) 500 MG tablet Take 500 mg by mouth See admin instructions. Take 500 mg twice daily for 3 days at onset of outbreak then take 500 mg once daily until clear     No current facility-administered medications for this visit.     OBJECTIVE: Young African-American woman in no acute distress  Vitals:   04/01/18 1226  BP: 119/82  Pulse: 86  Resp: 18  Temp: 98.2 F (36.8 C)  SpO2: 100%     Body mass index is 31.62 kg/m.   Wt Readings from Last 3 Encounters:  04/01/18 214 lb 1.6 oz (97.1 kg)  03/30/18 215 lb (97.5 kg)  03/23/18 213 lb (96.6 kg)      ECOG FS:0 - Asymptomatic  Sclerae unicteric, EOMs intact No cervical or supraclavicular adenopathy Lungs no  rales or rhonchi Heart regular rate and rhythm Abd soft, nontender, positive bowel sounds MSK no focal spinal tenderness, no upper extremity lymphedema Neuro: nonfocal, well oriented, appropriate affect Breasts: As noted before, there is an area of induration in the right breast inferior to the areola.  In the left breast there is induration superiorly, across both quadrants.  In either breast is there any erythema.  In the left axilla there is palpable adenopathy.  LAB RESULTS:  CMP     Component Value Date/Time   NA 139 03/23/2018 0855   K  4.2 03/23/2018 0855   CL 103 03/23/2018 0855   CO2 25 03/23/2018 0855   GLUCOSE 83 03/23/2018 0855   BUN 8 03/23/2018 0855   CREATININE 0.67 03/23/2018 0855   CALCIUM 9.4 03/23/2018 0855   PROT 7.6 03/23/2018 0855   ALBUMIN 3.3 (L) 03/23/2018 0855   AST 15 03/23/2018 0855   ALT 17 03/23/2018 0855   ALKPHOS 70 03/23/2018 0855   BILITOT 1.0 03/23/2018 0855   GFRNONAA >60 03/23/2018 0855   GFRAA >60 03/23/2018 0855    No results found for: Ronnald Ramp, A1GS, A2GS, BETS, BETA2SER, GAMS, MSPIKE, SPEI  No results found for: Nils Pyle, [facility]  Lab Results  Component Value Date   WBC 5.3 03/23/2018   NEUTROABS 3.0 03/23/2018   HGB 14.3 03/23/2018   HCT 43.7 03/23/2018   MCV 93.4 03/23/2018   PLT 354 03/23/2018      Chemistry      Component Value Date/Time   NA 139 03/23/2018 0855   K 4.2 03/23/2018 0855   CL 103 03/23/2018 0855   CO2 25 03/23/2018 0855   BUN 8 03/23/2018 0855   CREATININE 0.67 03/23/2018 0855      Component Value Date/Time   CALCIUM 9.4 03/23/2018 0855   ALKPHOS 70 03/23/2018 0855   AST 15 03/23/2018 0855   ALT 17 03/23/2018 0855   BILITOT 1.0 03/23/2018 0855       No results found for: LABCA2  No components found for: KXFGHW299  No results for input(s): INR in the last 168 hours.  No results found for: LABCA2  No results found for: BZJ696  No results found for:  VEL381  No results found for: OFB510  No results found for: CA2729  No components found for: HGQUANT  No results found for: CEA1 / No results found for: CEA1   No results found for: AFPTUMOR  No results found for: CHROMOGRNA  No results found for: PSA1  No visits with results within 3 Day(s) from this visit.  Latest known visit with results is:  Appointment on 03/23/2018  Component Date Value Ref Range Status  . Sodium 03/23/2018 139  135 - 145 mmol/L Final  . Potassium 03/23/2018 4.2  3.5 - 5.1 mmol/L Final  . Chloride 03/23/2018 103  98 - 111 mmol/L Final  . CO2 03/23/2018 25  22 - 32 mmol/L Final  . Glucose, Bld 03/23/2018 83  70 - 99 mg/dL Final  . BUN 03/23/2018 8  6 - 20 mg/dL Final  . Creatinine 03/23/2018 0.67  0.44 - 1.00 mg/dL Final  . Calcium 03/23/2018 9.4  8.9 - 10.3 mg/dL Final  . Total Protein 03/23/2018 7.6  6.5 - 8.1 g/dL Final  . Albumin 03/23/2018 3.3* 3.5 - 5.0 g/dL Final  . AST 03/23/2018 15  15 - 41 U/L Final  . ALT 03/23/2018 17  0 - 44 U/L Final  . Alkaline Phosphatase 03/23/2018 70  38 - 126 U/L Final  . Total Bilirubin 03/23/2018 1.0  0.3 - 1.2 mg/dL Final  . GFR, Est Non Af Am 03/23/2018 >60  >60 mL/min Final  . GFR, Est AFR Am 03/23/2018 >60  >60 mL/min Final   Comment: (NOTE) The eGFR has been calculated using the CKD EPI equation. This calculation has not been validated in all clinical situations. eGFR's persistently <60 mL/min signify possible Chronic Kidney Disease.   Georgiann Hahn gap 03/23/2018 11  5 - 15 Final   Performed at [facility], [place]  [address] [place], [place] [address]  . WBC Count 03/23/2018 5.3  4.0 - 10.5 K/uL Final  . RBC 03/23/2018 4.68  3.87 - 5.11 MIL/uL Final  . Hemoglobin 03/23/2018 14.3  12.0 - 15.0 g/dL Final  . HCT 03/23/2018 43.7  36.0 - 46.0 % Final  . MCV 03/23/2018 93.4  80.0 - 100.0 fL Final  . MCH 03/23/2018 30.6  26.0 - 34.0 pg Final  . MCHC 03/23/2018 32.7  30.0 - 36.0 g/dL Final    . RDW 03/23/2018 11.4* 11.5 - 15.5 % Final  . Platelet Count 03/23/2018 354  150 - 400 K/uL Final  . nRBC 03/23/2018 0.0  0.0 - 0.2 % Final  . Neutrophils Relative % 03/23/2018 56  % Final  . Neutro Abs 03/23/2018 3.0  1.7 - 7.7 K/uL Final  . Lymphocytes Relative 03/23/2018 30  % Final  . Lymphs Abs 03/23/2018 1.6  0.7 - 4.0 K/uL Final  . Monocytes Relative 03/23/2018 12  % Final  . Monocytes Absolute 03/23/2018 0.7  0.1 - 1.0 K/uL Final  . Eosinophils Relative 03/23/2018 1  % Final  . Eosinophils Absolute 03/23/2018 0.1  0.0 - 0.5 K/uL Final  . Basophils Relative 03/23/2018 1  % Final  . Basophils Absolute 03/23/2018 0.0  0.0 - 0.1 K/uL Final  . Immature Granulocytes 03/23/2018 0  % Final  . Abs Immature Granulocytes 03/23/2018 0.00  0.00 - 0.07 K/uL Final   Performed at [facility], [place] [address], [place], [place] [address]    (this displays the last labs from the last 3 days)  No results found for: TOTALPROTELP, ALBUMINELP, A1GS, A2GS, BETS, BETA2SER, GAMS, MSPIKE, SPEI (this displays SPEP labs)  No results found for: KPAFRELGTCHN, LAMBDASER, KAPLAMBRATIO (kappa/lambda light chains)  No results found for: HGBA, HGBA2QUANT, HGBFQUANT, HGBSQUAN (Hemoglobinopathy evaluation)   No results found for: LDH  No results found for: IRON, TIBC, IRONPCTSAT (Iron and TIBC)  No results found for: FERRITIN  Urinalysis No results found for: COLORURINE, APPEARANCEUR, LABSPEC, PHURINE, GLUCOSEU, HGBUR, BILIRUBINUR, KETONESUR, PROTEINUR, UROBILINOGEN, NITRITE, LEUKOCYTESUR   STUDIES: Mr Breast Bilateral W [place] Cad  Addendum Date: 03/30/2018   ADDENDUM REPORT: 03/30/2018 12:34 ADDENDUM: An addendum is made in the findings portion of the report due to a typo. This should read as: LEFT BREAST: Irregular enhancing mass containing biopsy clipartifact in the upper outer left breast anterior to mid depthmeasures 4.2 cm AP, 3.6 cm transverse, and 4.3 cm  craniocaudal.There is an additional 2.6 cm mass in the upper-outer left breastposterior depth (subtraction image 101). In addition, there are at least 2smaller irregular enhancing masses located posterior to thedominant mass, with an enhancing mass 2.2 cm posterior inferior to the dominantmass measuring 1.2 cm (subtraction image 137). Together all these massesmeasure approximately 9.8 cm AP. There is diffuse skin thickening of the left breast. There isabnormal masslike enhancement along the posterior margin of the leftpectoralis muscle (subtraction image 76), possibly related to thepectoralis muscle or the adjacent rib. Electronically Signed   By: Everlean Alstrom M.D.   On: 03/30/2018 12:34   Result Date: 03/30/2018 CLINICAL DATA:  47 year old female with recently diagnosed left breast cancer post ultrasound-guided biopsy of a mass in the left breast at the 1 o'clock position measuring approximately 4.2 cm. A biopsied lymph node was positive for metastatic carcinoma. Recent biopsies masses in the right breast at the 6 o'clock and 9 o'clock positions were both benign. LABS:  Not applicable. EXAM: BILATERAL BREAST MRI WITH  AND WITHOUT CONTRAST TECHNIQUE: Multiplanar, multisequence MR images of both breasts were obtained prior to and following the intravenous administration of 9 ml of Gadavist Three-dimensional MR images were rendered by post-processing of the original MR data on an independent workstation. The three-dimensional MR images were interpreted, and findings are reported in the following complete MRI report for this study. Three dimensional images were evaluated at the independent DynaCad workstation COMPARISON:  Previous exam(s). FINDINGS: Breast composition: c.  Heterogeneous fibroglandular tissue. Background parenchymal enhancement: Moderate. Right breast: Biopsy marking clips are seen at the site of previously biopsied benign masses in the lower right breast as well as slightly outer right breast.  There is an irregular enhancing mass in the central to slightly lower outer right breast (subtraction image 174) measuring approximately 5 mm. An additional 4-5 mm irregular enhancing mass is located just medial and slightly inferior to this mass (subtraction image 177). Left breast: Irregular enhancing mass containing biopsy clip artifact in the upper outer left breast anterior to mid depth measures 4.2 cm AP, 3.6 cm transverse, and 4.3 cm craniocaudal. There is an additional 2.6 cm mass in the upper-outer left breast posterior depth (subtraction image 101) as well as at least 2 smaller irregular enhancing masses located inferior posterior to the dominant mass, with a 2.2 cm mass posterior inferior to the dominant mass measuring 1.2 cm (subtraction image 137). Together these masses measure approximately 9.8 cm AP. There is diffuse skin thickening of the left breast. There is abnormal masslike enhancement along the posterior margin of the left pectoralis muscle (subtraction image 76), possibly related to the pectoralis muscle or the adjacent rib. Lymph nodes: There is bulky left axillary lymphadenopathy with numerous enlarged level II and level III axillary lymph nodes with a large mass containing areas of central necrosis extending superiorly and incompletely imaged. This could represent a nodal mass or possibly a mass involving the adjacent rib. Ancillary findings:  None. IMPRESSION: 1. Biopsy proven malignancy in the upper outer anterior left breast measures up to 4.3 cm with several additional masses and smaller satellite lesions extending posterior to the dominant mass, with total extent of disease measuring nearly 10 cm. 2. Bulky left axillary lymphadenopathy with large enhancing mass likely extending to the level of the clavicle although incompletely imaged. There is also abnormal masslike enhancement located along the posterior margin of the left pectoralis muscle possibly related to the pectoralis muscle or  within a rib. 3. Two small indeterminate enhancing masses in the central to slightly lower outer right breast. RECOMMENDATION: 1. if proving disease extent is necessary, then MRI guided biopsy of 2 additional posterior located masses could be performed (subtraction image 101 and subtraction image 138). 2. Recommend MRI guided biopsy of the small enhancing masses in the central to slightly lower outer left breast (these may be potentially biopsied together given their close proximity to each other). 3. Recommend additional imaging (PET CT/bone scan/possible neck CT) given the bulky left axillary lymphadenopathy, abnormal masslike enhancement posterior to the left pectoralis muscle and large necrotic mass extending toward the left clavicle. BI-RADS CATEGORY  4: Suspicious. Electronically Signed: By: Everlean Alstrom M.D. On: 03/30/2018 10:59   Dg Chest Port 1 View  Result Date: 03/30/2018 CLINICAL DATA:  Port placement. EXAM: PORTABLE CHEST 1 VIEW COMPARISON:  None. FINDINGS: Right chest wall port catheter with the tip in the mid SVC. The heart size and mediastinal contours are within normal limits. Normal pulmonary vascularity. No focal consolidation, pleural effusion, or pneumothorax. No  acute osseous abnormality. Thoracolumbar scoliosis. IMPRESSION: 1. Right chest wall port catheter with tip in the mid SVC. No active cardiopulmonary disease. Electronically Signed   By: Titus Dubin M.D.   On: 03/30/2018 15:43   Dg Fluoro Guide Cv Line-no Report  Result Date: 03/30/2018 Fluoroscopy was utilized by the requesting physician.  No radiographic interpretation.   US Breast Ltd Uni Left Inc Axilla  Result Date: 03/11/2018 CLINICAL DATA:  47 year old female presenting for evaluation of left axillary swelling and tenderness. EXAM: DIGITAL DIAGNOSTIC LEFT MAMMOGRAM WITH CAD AND TOMO ULTRASOUND LEFT BREAST COMPARISON:  Previous exam(s). ACR Breast Density Category c: The breast tissue is heterogeneously  dense, which may obscure small masses. FINDINGS: A BB has been placed over the left axilla at the palpable site of concern. On the spot compression tomosynthesis images deep to this marker there are numerous markedly enlarged left axillary lymph nodes. New skin thickening is identified particularly of the periareolar an inferior left breast. Spot compression tomosynthesis images of the superior to upper-outer left breast demonstrates an area of ill-defined distortion. Mammographic images were processed with CAD. On physical exam, there is a firm lump superior and slightly lateral to the left nipple. There is swelling of the left axilla with increased density along the inferior aspect of the swelling. Ultrasound of the left breast between 12 and 3 o'clock, between demonstrates ill-defined irregular hypoechoic tissue. As measured at the 12 o'clock position, 2 cm from the nipple this spans at least 4.2 x 2.1 x 3.1 cm. Multiple scattered cysts are seen in the upper-outer left breast. Ultrasound of the left axilla demonstrates numerous markedly enlarged lymph nodes. The cortex of 1 of these lymph nodes measures up to 1.3 cm. At least 6 abnormal lymph nodes are identified. IMPRESSION: 1. There is a broad suspicious in the upper-outer quadrant of the left breast measuring at least 4.2 cm. True measurement of the size is difficult due to the irregular shape an ill-defined appearance. 2. There are numerous (at least 6) markedly abnormal left axillary lymph nodes. RECOMMENDATION: 1. Ultrasound guided biopsy is recommended for the left breast mass which spans between 1 and 3 o'clock. Ultrasound biopsy of one of the left axillary lymph nodes is also recommended. The procedure has been scheduled for 03/16/2018 at 12:45 p.m. 2. If pathology results indicate malignancy, and breast conservation is a consideration, MRI should be performed to evaluate the true extent of disease. I have discussed the findings and recommendations with  the patient. Results were also provided in writing at the conclusion of the visit. If applicable, a reminder letter will be sent to the patient regarding the next appointment. BI-RADS CATEGORY  5: Highly suggestive of malignancy. Electronically Signed   By: Ammie Ferrier M.D.   On: 03/11/2018 16:50   US Breast Ltd Uni Right Inc Axilla  Result Date: 03/16/2018 CLINICAL DATA:  47 year old female with recent workup of the left breast demonstrating a suspicious mass an adenopathy. She presents today for evaluation of the right breast. EXAM: DIGITAL DIAGNOSTIC RIGHT MAMMOGRAM WITH CAD AND TOMO ULTRASOUND RIGHT BREAST COMPARISON:  Previous exam(s). ACR Breast Density Category c: The breast tissue is heterogeneously dense, which may obscure small masses. FINDINGS: Multiple circumscribed, equal density masses are noted in the upper-outer quadrant of the right breast. They appear mammographically stable, though evaluation is limited due to dense breast density. Further evaluation with ultrasound was performed. Mammographic images were processed with CAD. Targeted ultrasound is performed, showing diffuse fibrocystic changes demonstrating multiple circumscribed  anechoic masses throughout the upper outer and inferior right breast. An additional hypoechoic mass is noted at the 9 o'clock position 3 cm from the nipple. It measures 8 x 7 x 7 mm. There is no associated vascularity. A complex solid and cystic mass is identified superficially at the 6 o'clock position 3 cm from the nipple. It measures 1.6 x 1.4 x 0.3 cm. There is noted associated vascularity. Evaluation of the right axilla demonstrates no suspicious lymphadenopathy. IMPRESSION: 1. Two indeterminate right breast masses at the 9 o'clock position 3 cm from the nipple and 6 o'clock position 3 cm from the nipple. Recommendation is for ultrasound-guided biopsy of both areas. 2. Additional fibrocystic changes of the right breast. 3. No suspicious right axillary  lymphadenopathy. RECOMMENDATION: Two area ultrasound-guided biopsy of the right breast. This has been scheduled for 03/18/2018 at 9 a.m. The patient will proceed with 2 area ultrasound-guided biopsy of the left breast to follow. I have discussed the findings and recommendations with the patient. Results were also provided in writing at the conclusion of the visit. If applicable, a reminder letter will be sent to the patient regarding the next appointment. BI-RADS CATEGORY  4: Suspicious. Electronically Signed   By: Kristopher Oppenheim M.D.   On: 03/16/2018 12:59   Mm Diag Breast Tomo Uni Left  Result Date: 03/11/2018 CLINICAL DATA:  47 year old female presenting for evaluation of left axillary swelling and tenderness. EXAM: DIGITAL DIAGNOSTIC LEFT MAMMOGRAM WITH CAD AND TOMO ULTRASOUND LEFT BREAST COMPARISON:  Previous exam(s). ACR Breast Density Category c: The breast tissue is heterogeneously dense, which may obscure small masses. FINDINGS: A BB has been placed over the left axilla at the palpable site of concern. On the spot compression tomosynthesis images deep to this marker there are numerous markedly enlarged left axillary lymph nodes. New skin thickening is identified particularly of the periareolar an inferior left breast. Spot compression tomosynthesis images of the superior to upper-outer left breast demonstrates an area of ill-defined distortion. Mammographic images were processed with CAD. On physical exam, there is a firm lump superior and slightly lateral to the left nipple. There is swelling of the left axilla with increased density along the inferior aspect of the swelling. Ultrasound of the left breast between 12 and 3 o'clock, between demonstrates ill-defined irregular hypoechoic tissue. As measured at the 12 o'clock position, 2 cm from the nipple this spans at least 4.2 x 2.1 x 3.1 cm. Multiple scattered cysts are seen in the upper-outer left breast. Ultrasound of the left axilla demonstrates  numerous markedly enlarged lymph nodes. The cortex of 1 of these lymph nodes measures up to 1.3 cm. At least 6 abnormal lymph nodes are identified. IMPRESSION: 1. There is a broad suspicious in the upper-outer quadrant of the left breast measuring at least 4.2 cm. True measurement of the size is difficult due to the irregular shape an ill-defined appearance. 2. There are numerous (at least 6) markedly abnormal left axillary lymph nodes. RECOMMENDATION: 1. Ultrasound guided biopsy is recommended for the left breast mass which spans between 1 and 3 o'clock. Ultrasound biopsy of one of the left axillary lymph nodes is also recommended. The procedure has been scheduled for 03/16/2018 at 12:45 p.m. 2. If pathology results indicate malignancy, and breast conservation is a consideration, MRI should be performed to evaluate the true extent of disease. I have discussed the findings and recommendations with the patient. Results were also provided in writing at the conclusion of the visit. If applicable, a reminder  letter will be sent to the patient regarding the next appointment. BI-RADS CATEGORY  5: Highly suggestive of malignancy. Electronically Signed   By: Ammie Ferrier M.D.   On: 03/11/2018 16:50   Mm Diag Breast Tomo Uni Right  Result Date: 03/16/2018 CLINICAL DATA:  47 year old female with recent workup of the left breast demonstrating a suspicious mass an adenopathy. She presents today for evaluation of the right breast. EXAM: DIGITAL DIAGNOSTIC RIGHT MAMMOGRAM WITH CAD AND TOMO ULTRASOUND RIGHT BREAST COMPARISON:  Previous exam(s). ACR Breast Density Category c: The breast tissue is heterogeneously dense, which may obscure small masses. FINDINGS: Multiple circumscribed, equal density masses are noted in the upper-outer quadrant of the right breast. They appear mammographically stable, though evaluation is limited due to dense breast density. Further evaluation with ultrasound was performed. Mammographic  images were processed with CAD. Targeted ultrasound is performed, showing diffuse fibrocystic changes demonstrating multiple circumscribed anechoic masses throughout the upper outer and inferior right breast. An additional hypoechoic mass is noted at the 9 o'clock position 3 cm from the nipple. It measures 8 x 7 x 7 mm. There is no associated vascularity. A complex solid and cystic mass is identified superficially at the 6 o'clock position 3 cm from the nipple. It measures 1.6 x 1.4 x 0.3 cm. There is noted associated vascularity. Evaluation of the right axilla demonstrates no suspicious lymphadenopathy. IMPRESSION: 1. Two indeterminate right breast masses at the 9 o'clock position 3 cm from the nipple and 6 o'clock position 3 cm from the nipple. Recommendation is for ultrasound-guided biopsy of both areas. 2. Additional fibrocystic changes of the right breast. 3. No suspicious right axillary lymphadenopathy. RECOMMENDATION: Two area ultrasound-guided biopsy of the right breast. This has been scheduled for 03/18/2018 at 9 a.m. The patient will proceed with 2 area ultrasound-guided biopsy of the left breast to follow. I have discussed the findings and recommendations with the patient. Results were also provided in writing at the conclusion of the visit. If applicable, a reminder letter will be sent to the patient regarding the next appointment. BI-RADS CATEGORY  4: Suspicious. Electronically Signed   By: Kristopher Oppenheim M.D.   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 This was found to be concordant by Dr. Kristopher Oppenheim. Pathology results were discussed with the patient by telephone. The patient reported doing well after the biopsies with tenderness at the sites. Post biopsy  instructions and care were reviewed and questions were answered. The patient was encouraged to call [facility] for any additional concerns. The patient was referred to [facility] on March 23, 2018. Recommendation for a bilateral breast MRI for extent of disease given breast density. The patient is scheduled for two Right breast ultrasound guided biopsies on March 18, 2018 at [place]. Pathology results reported by Terie Purser, RN on 03/17/2018. Electronically Signed   By: Kristopher Oppenheim M.D.   On: 03/17/2018 15:14   Result Date: 03/28/2018 CLINICAL DATA:  47 year old female with a suspicious left breast mass and axillary adenopathy. EXAM: ULTRASOUND GUIDED LEFT BREAST CORE NEEDLE BIOPSY COMPARISON:  Previous exam(s). FINDINGS: I met with the patient and we discussed the procedure of ultrasound-guided biopsy, including benefits and  alternatives. We discussed the high likelihood of a successful procedure. We discussed the risks of the procedure, including infection, bleeding, tissue injury, clip migration, and inadequate sampling. Informed written consent was given. The usual time-out protocol was performed immediately prior to the procedure. Lesion quadrant: Upper-outer quadrant Using sterile technique and 1% Lidocaine as local anesthetic, under direct ultrasound visualization, a 12 gauge spring-loaded device was used to perform biopsy of an ill-defined left breast mass using a lateral approach. At the conclusion of the procedure a ribbon shaped tissue marker clip was deployed into the biopsy cavity. Using sterile technique and 1% Lidocaine as local anesthetic, under direct ultrasound visualization, a 14 gauge spring-loaded device was used to perform biopsy of a left axillary lymph node using a lateral approach. At the conclusion of the procedure a HydroMARK tissue marker clip was deployed into  the biopsy cavity. Follow up 2 view mammogram was performed and dictated separately. IMPRESSION: Ultrasound guided biopsy of the left breast and a left axillary lymph node. No apparent complications. Electronically Signed: By: Kristopher Oppenheim M.D. On: 03/16/2018 14:09   Mm Clip Placement Left  Result Date: 03/16/2018 CLINICAL DATA:  47 year old female status post ultrasound-guided biopsy of the left breast and left axilla. EXAM: DIAGNOSTIC LEFT MAMMOGRAM POST ULTRASOUND BIOPSY COMPARISON:  Previous exam(s). FINDINGS: Mammographic images were obtained following ultrasound guided biopsy of the left breast and axilla. Ribbon shaped clip is identified in the upper outer quadrant at anterior depth. HydroMARK clip is identified in the left axilla. IMPRESSION: Post biopsy clips in the expected locations. Final Assessment: Post Procedure Mammograms for Marker Placement Electronically Signed   By: Kristopher Oppenheim M.D.   On: 03/16/2018 14:11   Mm Clip Placement Right  Result Date: 03/18/2018 CLINICAL DATA:  Post biopsy mammogram of the right breast for clip placement. EXAM: DIAGNOSTIC RIGHT MAMMOGRAM POST ULTRASOUND BIOPSY COMPARISON:  Previous exam(s). FINDINGS: Mammographic images were obtained following ultrasound guided biopsy of 2 masses in the right breast. The ribbon shaped biopsy marking clip is well positioned at the site of the mass in the right breast at 6 o'clock. The coil shaped biopsy marking clip is well positioned at the site of the mass in the right breast at 9 o'clock. IMPRESSION: 1. Appropriate positioning of the ribbon shaped biopsy marking clip in the right breast mass at 6 o'clock. 2. Appropriate positioning of the coil shaped biopsy marking clip in the right breast at 9 o'clock Final Assessment: Post Procedure Mammograms for Marker Placement Electronically Signed   By: Ammie Ferrier M.D.   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 This was found to be concordant by Dr. Kristopher Oppenheim. Pathology results were discussed with the patient by telephone. The patient reported doing well after the biopsies with tenderness at the sites. Post biopsy instructions and care were reviewed and questions were answered. The patient was encouraged to call [facility] for any additional concerns. The patient was referred to [facility] at [facility] on March 23, 2018. Recommendation for a bilateral breast MRI for extent of disease given breast density. The patient is scheduled for two Right breast ultrasound guided biopsies on March 18, 2018  at [place]. Pathology results reported by Terie Purser, RN on 03/17/2018. Electronically Signed   By: Kristopher Oppenheim M.D.   On: 03/17/2018 15:14   Result Date: 03/28/2018 CLINICAL DATA:  47 year old female with a suspicious left breast mass and axillary adenopathy. EXAM: ULTRASOUND GUIDED LEFT BREAST CORE NEEDLE BIOPSY COMPARISON:  Previous exam(s). FINDINGS: I met with the patient and we discussed the procedure of ultrasound-guided biopsy, including benefits and alternatives. We discussed the high likelihood of a successful procedure. We discussed the risks of the procedure, including infection, bleeding, tissue injury, clip migration, and inadequate sampling. Informed written consent was given. The usual time-out protocol was performed immediately prior to the procedure. Lesion quadrant: Upper-outer quadrant Using sterile technique and 1% Lidocaine as local anesthetic, under direct ultrasound visualization, a 12 gauge spring-loaded device was used to perform biopsy of an ill-defined left breast mass using a  lateral approach. At the conclusion of the procedure a ribbon shaped tissue marker clip was deployed into the biopsy cavity. Using sterile technique and 1% Lidocaine as local anesthetic, under direct ultrasound visualization, a 14 gauge spring-loaded device was used to perform biopsy of a left axillary lymph node using a lateral approach. At the conclusion of the procedure a HydroMARK tissue marker clip was deployed into the biopsy cavity. Follow up 2 view mammogram was performed and dictated separately. IMPRESSION: Ultrasound guided biopsy of the left breast and a left axillary lymph node. No apparent complications. Electronically Signed: By: Kristopher Oppenheim M.D. On: 03/16/2018 14:09   [place] Rt Breast Bx W Loc Dev 1st Lesion Img Bx Spec US Guide  Addendum Date: 03/22/2018   ADDENDUM REPORT: 03/22/2018 07:27 ADDENDUM: Pathology revealed FIBROCYSTIC CHANGES, PSEUDOANGIOMATOUS STROMAL HYPERPLASIA ([place]), of the RIGHT breast, 6 o'clock, 3cmfn. FIBROCYSTIC CHANGES WITH FOCI OF CHRONIC INFLAMMATION of the RIGHT breast, 9 o'clock. This was found to be concordant by Dr. Ammie Ferrier. Pathology results were discussed with the patient by telephone. The patient reported doing well after the biopsies with tenderness and minimal bleeding at the sites. Post biopsy instructions and care were reviewed and questions were answered. The patient was encouraged to call [facility] for any additional concerns. The patient has a recent diagnosis of LEFT breast cancer and should follow her outlined treatment plan. The patient was referred to [facility] at [facility] on March 23, 2018. Recommendation for a bilateral breast MRI if breast conservation is a consideration for further evaluation of extent of disease due to heterogeneously dense breast tissue. Pathology results reported by Terie Purser, RN on 03/22/2018. Electronically Signed   By:  Ammie Ferrier M.D.   On: 03/22/2018 07:27   Result Date: 03/22/2018 CLINICAL DATA:  47 year old female presenting for ultrasound-guided biopsy of 2 masses in the right breast. EXAM: ULTRASOUND GUIDED RIGHT BREAST CORE NEEDLE BIOPSY COMPARISON:  Previous exam(s). FINDINGS: I met with the patient and we discussed the procedure of ultrasound-guided biopsy, including benefits and alternatives. We discussed the high likelihood of a successful procedure. We discussed the risks of the procedure, including infection, bleeding, tissue injury, clip migration, and inadequate sampling. Informed written consent was given. The usual time-out protocol was performed immediately prior to the procedure. Lesion quadrant: Lower outer quadrant Using sterile technique and 1% Lidocaine as local anesthetic, under direct ultrasound visualization, a 14 gauge spring-loaded device was used to perform biopsy of a mass in the right breast at 6 o'clock using an inferior approach.  At the conclusion of the procedure a ribbon shaped tissue marker clip was deployed into the biopsy cavity. Lesion quadrant: Lower outer quadrant Using sterile technique and 1% Lidocaine as local anesthetic, under direct ultrasound visualization, a 14 gauge spring-loaded device was used to perform biopsy of a mass in the right breast at 9 o'clock using an inferior approach. At the conclusion of the procedure a coil shaped tissue marker clip was deployed into the biopsy cavity. Follow up 2 view mammogram was performed and dictated separately. IMPRESSION: 1. Ultrasound guided biopsy of a right breast mass at 6 o'clock. No apparent complications. 2. Ultrasound-guided biopsy of a right breast mass at 9 o'clock. No apparent complications. Electronically Signed: By: Ammie Ferrier M.D. On: 03/18/2018 10:43   [place] Rt Breast Bx W Loc Dev Ea Add Lesion Img Bx Spec US Guide  Addendum Date: 03/22/2018   ADDENDUM REPORT: 03/22/2018 07:27 ADDENDUM: Pathology revealed  FIBROCYSTIC CHANGES, PSEUDOANGIOMATOUS STROMAL HYPERPLASIA ([place]), of the RIGHT breast, 6 o'clock, 3cmfn. FIBROCYSTIC CHANGES WITH FOCI OF CHRONIC INFLAMMATION of the RIGHT breast, 9 o'clock. This was found to be concordant by Dr. Ammie Ferrier. Pathology results were discussed with the patient by telephone. The patient reported doing well after the biopsies with tenderness and minimal bleeding at the sites. Post biopsy instructions and care were reviewed and questions were answered. The patient was encouraged to call [facility] for any additional concerns. The patient has a recent diagnosis of LEFT breast cancer and should follow her outlined treatment plan. The patient was referred to [facility] at [facility] on March 23, 2018. Recommendation for a bilateral breast MRI if breast conservation is a consideration for further evaluation of extent of disease due to heterogeneously dense breast tissue. Pathology results reported by Terie Purser, RN on 03/22/2018. Electronically Signed   By: Ammie Ferrier M.D.   On: 03/22/2018 07:27   Result Date: 03/22/2018 CLINICAL DATA:  47 year old female presenting for ultrasound-guided biopsy of 2 masses in the right breast. EXAM: ULTRASOUND GUIDED RIGHT BREAST CORE NEEDLE BIOPSY COMPARISON:  Previous exam(s). FINDINGS: I met with the patient and we discussed the procedure of ultrasound-guided biopsy, including benefits and alternatives. We discussed the high likelihood of a successful procedure. We discussed the risks of the procedure, including infection, bleeding, tissue injury, clip migration, and inadequate sampling. Informed written consent was given. The usual time-out protocol was performed immediately prior to the procedure. Lesion quadrant: Lower outer quadrant Using sterile technique and 1% Lidocaine as local anesthetic, under direct ultrasound visualization, a 14  gauge spring-loaded device was used to perform biopsy of a mass in the right breast at 6 o'clock using an inferior approach. At the conclusion of the procedure a ribbon shaped tissue marker clip was deployed into the biopsy cavity. Lesion quadrant: Lower outer quadrant Using sterile technique and 1% Lidocaine as local anesthetic, under direct ultrasound visualization, a 14 gauge spring-loaded device was used to perform biopsy of a mass in the right breast at 9 o'clock using an inferior approach. At the conclusion of the procedure a coil shaped tissue marker clip was deployed into the biopsy cavity. Follow up 2 view mammogram was performed and dictated separately. IMPRESSION: 1. Ultrasound guided biopsy of a right breast mass at 6 o'clock. No apparent complications. 2. Ultrasound-guided biopsy of a right breast mass at 9 o'clock. No apparent complications. Electronically Signed: By: Ammie Ferrier M.D. On: 03/18/2018 10:43  ELIGIBLE FOR AVAILABLE RESEARCH PROTOCOL:  May eventually qualify for BR004; UPBEAT  ASSESSMENT: 47 y.o. [place] woman status post left breast overlapping site biopsy and left axillary lymph node biopsy 03/16/2018 for a clinical T2 N3, stage IIIC invasive ductal carcinoma, triple negative, with an MIB-1 of 15%  (1) genetics testing pending  (2) neoadjuvant chemotherapy to consist of doxorubicin and cyclophosphamide in dose dense fashion x4 followed by weekly carboplatin and paclitaxel x12  (3) definitive surgery to follow  (4) adjuvant radiation  PLAN: Jennett did well with her port placement and has an excellent ejection fraction.  She is now ready to start chemotherapy.  Today we reviewed the possible side effects toxicities and complications of her chemotherapy agents and I gave her a "roadmap" on how to take her supportive medications.  We also discussed whether or not she should have additional biopsies.  She really hated the MRI because it gave her migraines,  which is a problem for her.  She is willing to undergo further biopsies if there is a reasonable possibility of avoiding a mastectomy.  There is a question I really cannot answer so I am referring her to Dr. [name].  If she really should have a mastectomy even if she has an excellent radiologic response to chemo, then we can avoid further MRIs and biopsies and simply proceed to what would probably be bilateral mastectomies.  Otherwise we will proceed with biopsies which probably would be done late next week or early the following week.  She is also scheduled for CT scans and bone scan next week to complete her staging.  I will see her again in a week after her first cycle to troubleshoot side effects.  She will keep a diary of symptoms to facilitate that  She knows to call for any other issues that may develop before the next visit.    Dierdre Searles Dweik am acting as scribe for Dr. Virgie Dad Alaysiah Browder.  I, Lurline Del MD, have reviewed the above documentation for accuracy and completeness, and I agree with the above.    Chauncey Cruel, MD   04/01/2018 4:49 PM Medical Oncology and Hematology [facility] [address] [place], [place] [address] Tel. [phone]    Fax. [phone]

## 2018-04-01 ENCOUNTER — Telehealth: Payer: Self-pay | Admitting: *Deleted

## 2018-04-01 ENCOUNTER — Encounter: Payer: Self-pay | Admitting: *Deleted

## 2018-04-01 ENCOUNTER — Inpatient Hospital Stay: Payer: BC Managed Care – PPO | Attending: Oncology | Admitting: Oncology

## 2018-04-01 VITALS — BP 119/82 | HR 86 | Temp 98.2°F | Resp 18 | Ht 69.0 in | Wt 214.1 lb

## 2018-04-01 DIAGNOSIS — Z7982 Long term (current) use of aspirin: Secondary | ICD-10-CM | POA: Diagnosis not present

## 2018-04-01 DIAGNOSIS — Z90710 Acquired absence of both cervix and uterus: Secondary | ICD-10-CM

## 2018-04-01 DIAGNOSIS — Z79899 Other long term (current) drug therapy: Secondary | ICD-10-CM

## 2018-04-01 DIAGNOSIS — C50412 Malignant neoplasm of upper-outer quadrant of left female breast: Secondary | ICD-10-CM | POA: Diagnosis not present

## 2018-04-01 DIAGNOSIS — Z171 Estrogen receptor negative status [ER-]: Secondary | ICD-10-CM

## 2018-04-01 NOTE — Telephone Encounter (Signed)
  Oncology Nurse Navigator Documentation      )                                                           

## 2018-04-04 ENCOUNTER — Other Ambulatory Visit: Payer: Self-pay | Admitting: Oncology

## 2018-04-05 ENCOUNTER — Encounter (HOSPITAL_COMMUNITY)
Admission: RE | Admit: 2018-04-05 | Discharge: 2018-04-05 | Disposition: A | Discharge status: Home / Self Care | Payer: BC Managed Care – PPO | Source: Ambulatory Visit | Attending: Oncology | Admitting: Oncology

## 2018-04-05 ENCOUNTER — Ambulatory Visit (HOSPITAL_COMMUNITY)
Admission: RE | Admit: 2018-04-05 | Discharge: 2018-04-05 | Disposition: A | Discharge status: Home / Self Care | Payer: BC Managed Care – PPO | Source: Ambulatory Visit | Attending: Oncology | Admitting: Oncology

## 2018-04-05 ENCOUNTER — Encounter (HOSPITAL_COMMUNITY): Payer: Self-pay | Admitting: General Surgery

## 2018-04-05 DIAGNOSIS — C50412 Malignant neoplasm of upper-outer quadrant of left female breast: Secondary | ICD-10-CM | POA: Diagnosis not present

## 2018-04-05 DIAGNOSIS — N6489 Other specified disorders of breast: Secondary | ICD-10-CM | POA: Insufficient documentation

## 2018-04-05 DIAGNOSIS — J479 Bronchiectasis, uncomplicated: Secondary | ICD-10-CM | POA: Insufficient documentation

## 2018-04-05 DIAGNOSIS — Z171 Estrogen receptor negative status [ER-]: Secondary | ICD-10-CM | POA: Insufficient documentation

## 2018-04-05 DIAGNOSIS — R234 Changes in skin texture: Secondary | ICD-10-CM | POA: Insufficient documentation

## 2018-04-05 DIAGNOSIS — R590 Localized enlarged lymph nodes: Secondary | ICD-10-CM | POA: Insufficient documentation

## 2018-04-05 MED ORDER — TECHNETIUM TC 99M MEDRONATE IV KIT
19.5000 | PACK | Freq: Once | INTRAVENOUS | Status: AC | PRN
  Administered 2018-04-05: 19.5 via INTRAVENOUS

## 2018-04-05 MED ORDER — SODIUM CHLORIDE 0.9 % IJ SOLN
INTRAMUSCULAR | Status: AC
  Filled 2018-04-05: qty 50

## 2018-04-05 MED ORDER — IOHEXOL 300 MG/ML  SOLN
75.0000 mL | Freq: Once | INTRAMUSCULAR | Status: AC | PRN
  Administered 2018-04-05: 75 mL via INTRAVENOUS

## 2018-04-06 ENCOUNTER — Other Ambulatory Visit: Payer: Self-pay | Admitting: *Deleted

## 2018-04-06 ENCOUNTER — Telehealth: Payer: Self-pay | Admitting: *Deleted

## 2018-04-06 DIAGNOSIS — C50412 Malignant neoplasm of upper-outer quadrant of left female breast: Secondary | ICD-10-CM

## 2018-04-06 DIAGNOSIS — Z171 Estrogen receptor negative status [ER-]: Principal | ICD-10-CM

## 2018-04-06 NOTE — Telephone Encounter (Signed)
Spoke with patient concerning her ct neck and recommendations for a brain mri for a small enhancing focus in the left frontal region.  Patient is scheduled for 11/7 at 7am at [facility] and is aware of her appointment.

## 2018-04-07 ENCOUNTER — Inpatient Hospital Stay: Payer: BC Managed Care – PPO

## 2018-04-07 ENCOUNTER — Encounter: Payer: Self-pay | Admitting: *Deleted

## 2018-04-07 ENCOUNTER — Other Ambulatory Visit: Payer: Self-pay | Admitting: Oncology

## 2018-04-07 ENCOUNTER — Ambulatory Visit (HOSPITAL_COMMUNITY)
Admission: RE | Admit: 2018-04-07 | Discharge: 2018-04-07 | Disposition: A | Discharge status: Home / Self Care | Payer: BC Managed Care – PPO | Source: Ambulatory Visit | Attending: Oncology | Admitting: Oncology

## 2018-04-07 VITALS — BP 113/82 | HR 76 | Temp 98.5°F | Resp 18

## 2018-04-07 DIAGNOSIS — Z95828 Presence of other vascular implants and grafts: Secondary | ICD-10-CM | POA: Insufficient documentation

## 2018-04-07 DIAGNOSIS — Z171 Estrogen receptor negative status [ER-]: Secondary | ICD-10-CM | POA: Diagnosis not present

## 2018-04-07 DIAGNOSIS — C50412 Malignant neoplasm of upper-outer quadrant of left female breast: Secondary | ICD-10-CM

## 2018-04-07 LAB — COMPREHENSIVE METABOLIC PANEL
ALBUMIN: 3.5 g/dL (ref 3.5–5.0)
ALK PHOS: 66 U/L (ref 38–126)
ALT: 9 U/L (ref 0–44)
AST: 13 U/L — ABNORMAL LOW (ref 15–41)
Anion gap: 8 (ref 5–15)
BILIRUBIN TOTAL: 0.8 mg/dL (ref 0.3–1.2)
BUN: 10 mg/dL (ref 6–20)
CALCIUM: 9.2 mg/dL (ref 8.9–10.3)
CO2: 26 mmol/L (ref 22–32)
CREATININE: 0.68 mg/dL (ref 0.44–1.00)
Chloride: 105 mmol/L (ref 98–111)
GFR calc Af Amer: >60 mL/min (ref >60)
GLUCOSE: 86 mg/dL (ref 70–99)
POTASSIUM: 4.1 mmol/L (ref 3.5–5.1)
Sodium: 139 mmol/L (ref 135–145)
Total Protein: 7.3 g/dL (ref 6.5–8.1)

## 2018-04-07 LAB — CBC WITH DIFFERENTIAL/PLATELET
ABS IMMATURE GRANULOCYTES: 0.01 10*3/uL (ref 0.00–0.07)
Basophils Absolute: 0 10*3/uL (ref 0.0–0.1)
Basophils Relative: 1 %
EOS ABS: 0.1 10*3/uL (ref 0.0–0.5)
EOS PCT: 2 %
HCT: 40.3 % (ref 36.0–46.0)
Hemoglobin: 13.1 g/dL (ref 12.0–15.0)
Immature Granulocytes: 0 %
Lymphocytes Relative: 33 %
Lymphs Abs: 1.9 10*3/uL (ref 0.7–4.0)
MCH: 30.5 pg (ref 26.0–34.0)
MCHC: 32.5 g/dL (ref 30.0–36.0)
MCV: 93.7 fL (ref 80.0–100.0)
Monocytes Absolute: 0.6 10*3/uL (ref 0.1–1.0)
Monocytes Relative: 10 %
Neutro Abs: 3.2 10*3/uL (ref 1.7–7.7)
Neutrophils Relative %: 54 %
PLATELETS: 300 10*3/uL (ref 150–400)
RBC: 4.3 MIL/uL (ref 3.87–5.11)
RDW: 11.4 % — AB (ref 11.5–15.5)
WBC: 5.9 10*3/uL (ref 4.0–10.5)
nRBC: 0 % (ref 0.0–0.2)

## 2018-04-07 MED ORDER — SODIUM CHLORIDE 0.9% FLUSH
10.0000 mL | Freq: Once | INTRAVENOUS | Status: AC
  Administered 2018-04-07: 10 mL
  Filled 2018-04-07: qty 10

## 2018-04-07 MED ORDER — GADOBUTROL 1 MMOL/ML IV SOLN
9.0000 mL | Freq: Once | INTRAVENOUS | Status: AC | PRN
  Administered 2018-04-07: 9 mL via INTRAVENOUS

## 2018-04-07 MED ORDER — DOXORUBICIN HCL CHEMO IV INJECTION 2 MG/ML
60.0000 mg/m2 | Freq: Once | INTRAVENOUS | Status: AC
  Administered 2018-04-07: 130 mg via INTRAVENOUS
  Filled 2018-04-07: qty 65

## 2018-04-07 MED ORDER — PALONOSETRON HCL INJECTION 0.25 MG/5ML
0.2500 mg | Freq: Once | INTRAVENOUS | Status: AC
  Administered 2018-04-07: 0.25 mg via INTRAVENOUS

## 2018-04-07 MED ORDER — SODIUM CHLORIDE 0.9 % IV SOLN
Freq: Once | INTRAVENOUS | Status: AC
  Administered 2018-04-07: 14:00:00 via INTRAVENOUS
  Filled 2018-04-07: qty 5

## 2018-04-07 MED ORDER — SODIUM CHLORIDE 0.9 % IV SOLN
Freq: Once | INTRAVENOUS | Status: AC
  Administered 2018-04-07: 14:00:00 via INTRAVENOUS
  Filled 2018-04-07: qty 250

## 2018-04-07 MED ORDER — SODIUM CHLORIDE 0.9 % IV SOLN
600.0000 mg/m2 | Freq: Once | INTRAVENOUS | Status: AC
  Administered 2018-04-07: 1300 mg via INTRAVENOUS
  Filled 2018-04-07: qty 65

## 2018-04-07 MED ORDER — PALONOSETRON HCL INJECTION 0.25 MG/5ML
INTRAVENOUS | Status: AC
  Filled 2018-04-07: qty 5

## 2018-04-07 MED ORDER — SODIUM CHLORIDE 0.9% FLUSH
10.0000 mL | INTRAVENOUS | Status: DC | PRN
  Administered 2018-04-07: 10 mL
  Filled 2018-04-07: qty 10

## 2018-04-07 MED ORDER — HEPARIN SOD (PORK) LOCK FLUSH 100 UNIT/ML IV SOLN
500.0000 [IU] | Freq: Once | INTRAVENOUS | Status: AC | PRN
  Administered 2018-04-07: 500 [IU]
  Filled 2018-04-07: qty 5

## 2018-04-07 NOTE — Patient Instructions (Signed)
[facility] Discharge Instructions for Patients Receiving Chemotherapy  Today you received the following chemotherapy agents Adriamycin and Cytoxan.   To help prevent nausea and vomiting after your treatment, we encourage you to take your nausea medication as directed.  If you develop nausea and vomiting that is not controlled by your nausea medication, call the clinic.   BELOW ARE SYMPTOMS THAT SHOULD BE REPORTED IMMEDIATELY:  *FEVER GREATER THAN 100.5 F  *CHILLS WITH OR WITHOUT FEVER  NAUSEA AND VOMITING THAT IS NOT CONTROLLED WITH YOUR NAUSEA MEDICATION  *UNUSUAL SHORTNESS OF BREATH  *UNUSUAL BRUISING OR BLEEDING  TENDERNESS IN MOUTH AND THROAT WITH OR WITHOUT PRESENCE OF ULCERS  *URINARY PROBLEMS  *BOWEL PROBLEMS  UNUSUAL RASH Items with * indicate a potential emergency and should be followed up as soon as possible.  Feel free to call the clinic should you have any questions or concerns. The clinic phone number is [phone].  Please show the [facility] at check-in to the Emergency Department and triage nurse.  Doxorubicin injection What is this medicine? DOXORUBICIN (dox oh ROO bi sin) is a chemotherapy drug. It is used to treat many kinds of cancer like leukemia, lymphoma, neuroblastoma, sarcoma, and Wilms' tumor. It is also used to treat bladder cancer, breast cancer, lung cancer, ovarian cancer, stomach cancer, and thyroid cancer. This medicine may be used for other purposes; ask your health care provider or pharmacist if you have questions. COMMON BRAND NAME(S): Adriamycin, Adriamycin PFS, Adriamycin RDF, Rubex What should I tell my health care provider before I take this medicine? They need to know if you have any of these conditions: -heart disease -history of low blood counts caused by a medicine -liver disease -recent or ongoing radiation therapy -an unusual or allergic reaction to doxorubicin, other chemotherapy agents, other  medicines, foods, dyes, or preservatives -pregnant or trying to get pregnant -breast-feeding How should I use this medicine? This drug is given as an infusion into a vein. It is administered in a hospital or clinic by a specially trained health care professional. If you have pain, swelling, burning or any unusual feeling around the site of your injection, tell your health care professional right away. Talk to your pediatrician regarding the use of this medicine in children. Special care may be needed. Overdosage: If you think you have taken too much of this medicine contact a poison control center or emergency room at once. NOTE: This medicine is only for you. Do not share this medicine with others. What if I miss a dose? It is important not to miss your dose. Call your doctor or health care professional if you are unable to keep an appointment. What may interact with this medicine? This medicine may interact with the following medications: -6-mercaptopurine -paclitaxel -phenytoin -St. John's Wort -trastuzumab -verapamil This list may not describe all possible interactions. Give your health care provider a list of all the medicines, herbs, non-prescription drugs, or dietary supplements you use. Also tell them if you smoke, drink alcohol, or use illegal drugs. Some items may interact with your medicine. What should I watch for while using this medicine? This drug may make you feel generally unwell. This is not uncommon, as chemotherapy can affect healthy cells as well as cancer cells. Report any side effects. Continue your course of treatment even though you feel ill unless your doctor tells you to stop. There is a maximum amount of this medicine you should receive throughout your life. The amount depends on  the medical condition being treated and your overall health. Your doctor will watch how much of this medicine you receive in your lifetime. Tell your doctor if you have taken this medicine  before. You may need blood work done while you are taking this medicine. Your urine may turn red for a few days after your dose. This is not blood. If your urine is dark or brown, call your doctor. In some cases, you may be given additional medicines to help with side effects. Follow all directions for their use. Call your doctor or health care professional for advice if you get a fever, chills or sore throat, or other symptoms of a cold or flu. Do not treat yourself. This drug decreases your body's ability to fight infections. Try to avoid being around people who are sick. This medicine may increase your risk to bruise or bleed. Call your doctor or health care professional if you notice any unusual bleeding. Talk to your doctor about your risk of cancer. You may be more at risk for certain types of cancers if you take this medicine. Do not become pregnant while taking this medicine or for 6 months after stopping it. Women should inform their doctor if they wish to become pregnant or think they might be pregnant. Men should not father a child while taking this medicine and for 6 months after stopping it. There is a potential for serious side effects to an unborn child. Talk to your health care professional or pharmacist for more information. Do not breast-feed an infant while taking this medicine. This medicine has caused ovarian failure in some women and reduced sperm counts in some men This medicine may interfere with the ability to have a child. Talk with your doctor or health care professional if you are concerned about your fertility. What side effects may I notice from receiving this medicine? Side effects that you should report to your doctor or health care professional as soon as possible: -allergic reactions like skin rash, itching or hives, swelling of the face, lips, or tongue -breathing problems -chest pain -fast or irregular heartbeat -low blood counts - this medicine may decrease the  number of white blood cells, red blood cells and platelets. You may be at increased risk for infections and bleeding. -pain, redness, or irritation at site where injected -signs of infection - fever or chills, cough, sore throat, pain or difficulty passing urine -signs of decreased platelets or bleeding - bruising, pinpoint red spots on the skin, black, tarry stools, blood in the urine -swelling of the ankles, feet, hands -tiredness -weakness Side effects that usually do not require medical attention (report to your doctor or health care professional if they continue or are bothersome): -diarrhea -hair loss -mouth sores -nail discoloration or damage -nausea -red colored urine -vomiting This list may not describe all possible side effects. Call your doctor for medical advice about side effects. You may report side effects to FDA at 1-800-FDA-1088. Where should I keep my medicine? This drug is given in a hospital or clinic and will not be stored at home. NOTE: This sheet is a summary. It may not cover all possible information. If you have questions about this medicine, talk to your doctor, pharmacist, or health care provider.  2018 Elsevier/Gold Standard (2015-07-15 11:28:51)  Cyclophosphamide injection What is this medicine? CYCLOPHOSPHAMIDE (sye kloe FOSS fa mide) is a chemotherapy drug. It slows the growth of cancer cells. This medicine is used to treat many types of cancer like lymphoma,  myeloma, leukemia, breast cancer, and ovarian cancer, to name a few. This medicine may be used for other purposes; ask your health care provider or pharmacist if you have questions. COMMON BRAND NAME(S): Cytoxan, Neosar What should I tell my health care provider before I take this medicine? They need to know if you have any of these conditions: -blood disorders -history of other chemotherapy -infection -kidney disease -liver disease -recent or ongoing radiation therapy -tumors in the bone  marrow -an unusual or allergic reaction to cyclophosphamide, other chemotherapy, other medicines, foods, dyes, or preservatives -pregnant or trying to get pregnant -breast-feeding How should I use this medicine? This drug is usually given as an injection into a vein or muscle or by infusion into a vein. It is administered in a hospital or clinic by a specially trained health care professional. Talk to your pediatrician regarding the use of this medicine in children. Special care may be needed. Overdosage: If you think you have taken too much of this medicine contact a poison control center or emergency room at once. NOTE: This medicine is only for you. Do not share this medicine with others. What if I miss a dose? It is important not to miss your dose. Call your doctor or health care professional if you are unable to keep an appointment. What may interact with this medicine? This medicine may interact with the following medications: -amiodarone -amphotericin B -azathioprine -certain antiviral medicines for HIV or AIDS such as protease inhibitors (e.g., indinavir, ritonavir) and zidovudine -certain blood pressure medications such as benazepril, captopril, enalapril, fosinopril, lisinopril, moexipril, monopril, perindopril, quinapril, ramipril, trandolapril -certain cancer medications such as anthracyclines (e.g., daunorubicin, doxorubicin), busulfan, cytarabine, paclitaxel, pentostatin, tamoxifen, trastuzumab -certain diuretics such as chlorothiazide, chlorthalidone, hydrochlorothiazide, indapamide, metolazone -certain medicines that treat or prevent blood clots like warfarin -certain muscle relaxants such as succinylcholine -cyclosporine -etanercept -indomethacin -medicines to increase blood counts like filgrastim, pegfilgrastim, sargramostim -medicines used as general anesthesia -metronidazole -natalizumab This list may not describe all possible interactions. Give your health care  provider a list of all the medicines, herbs, non-prescription drugs, or dietary supplements you use. Also tell them if you smoke, drink alcohol, or use illegal drugs. Some items may interact with your medicine. What should I watch for while using this medicine? Visit your doctor for checks on your progress. This drug may make you feel generally unwell. This is not uncommon, as chemotherapy can affect healthy cells as well as cancer cells. Report any side effects. Continue your course of treatment even though you feel ill unless your doctor tells you to stop. Drink water or other fluids as directed. Urinate often, even at night. In some cases, you may be given additional medicines to help with side effects. Follow all directions for their use. Call your doctor or health care professional for advice if you get a fever, chills or sore throat, or other symptoms of a cold or flu. Do not treat yourself. This drug decreases your body's ability to fight infections. Try to avoid being around people who are sick. This medicine may increase your risk to bruise or bleed. Call your doctor or health care professional if you notice any unusual bleeding. Be careful brushing and flossing your teeth or using a toothpick because you may get an infection or bleed more easily. If you have any dental work done, tell your dentist you are receiving this medicine. You may get drowsy or dizzy. Do not drive, use machinery, or do anything that needs mental alertness  until you know how this medicine affects you. Do not become pregnant while taking this medicine or for 1 year after stopping it. Women should inform their doctor if they wish to become pregnant or think they might be pregnant. Men should not father a child while taking this medicine and for 4 months after stopping it. There is a potential for serious side effects to an unborn child. Talk to your health care professional or pharmacist for more information. Do not  breast-feed an infant while taking this medicine. This medicine may interfere with the ability to have a child. This medicine has caused ovarian failure in some women. This medicine has caused reduced sperm counts in some men. You should talk with your doctor or health care professional if you are concerned about your fertility. If you are going to have surgery, tell your doctor or health care professional that you have taken this medicine. What side effects may I notice from receiving this medicine? Side effects that you should report to your doctor or health care professional as soon as possible: -allergic reactions like skin rash, itching or hives, swelling of the face, lips, or tongue -low blood counts - this medicine may decrease the number of white blood cells, red blood cells and platelets. You may be at increased risk for infections and bleeding. -signs of infection - fever or chills, cough, sore throat, pain or difficulty passing urine -signs of decreased platelets or bleeding - bruising, pinpoint red spots on the skin, black, tarry stools, blood in the urine -signs of decreased red blood cells - unusually weak or tired, fainting spells, lightheadedness -breathing problems -dark urine -dizziness -palpitations -swelling of the ankles, feet, hands -trouble passing urine or change in the amount of urine -weight gain -yellowing of the eyes or skin Side effects that usually do not require medical attention (report to your doctor or health care professional if they continue or are bothersome): -changes in nail or skin color -hair loss -missed menstrual periods -mouth sores -nausea, vomiting This list may not describe all possible side effects. Call your doctor for medical advice about side effects. You may report side effects to FDA at 1-800-FDA-1088. Where should I keep my medicine? This drug is given in a hospital or clinic and will not be stored at home. NOTE: This sheet is a  summary. It may not cover all possible information. If you have questions about this medicine, talk to your doctor, pharmacist, or health care provider.  2018 Elsevier/Gold Standard (2012-04-01 16:22:58)

## 2018-04-08 LAB — CANCER ANTIGEN 27.29: CA 27.29: 213.7 U/mL — ABNORMAL HIGH (ref 0.0–38.6)

## 2018-04-09 ENCOUNTER — Inpatient Hospital Stay: Payer: BC Managed Care – PPO

## 2018-04-09 VITALS — BP 124/77 | HR 83 | Temp 98.6°F

## 2018-04-09 DIAGNOSIS — C50412 Malignant neoplasm of upper-outer quadrant of left female breast: Secondary | ICD-10-CM | POA: Diagnosis not present

## 2018-04-09 DIAGNOSIS — Z171 Estrogen receptor negative status [ER-]: Principal | ICD-10-CM

## 2018-04-09 MED ORDER — PEGFILGRASTIM-CBQV 6 MG/0.6ML ~~LOC~~ SOSY
6.0000 mg | PREFILLED_SYRINGE | Freq: Once | SUBCUTANEOUS | Status: AC
  Administered 2018-04-09: 6 mg via SUBCUTANEOUS

## 2018-04-09 MED ORDER — PEGFILGRASTIM-CBQV 6 MG/0.6ML ~~LOC~~ SOSY
PREFILLED_SYRINGE | SUBCUTANEOUS | Status: AC
  Filled 2018-04-09: qty 0.6

## 2018-04-09 NOTE — Patient Instructions (Signed)
Pegfilgrastim injection What is this medicine? PEGFILGRASTIM (PEG fil gra stim) is a long-acting granulocyte colony-stimulating factor that stimulates the growth of neutrophils, a type of white blood cell important in the body's fight against infection. It is used to reduce the incidence of fever and infection in patients with certain types of cancer who are receiving chemotherapy that affects the bone marrow, and to increase survival after being exposed to high doses of radiation. This medicine may be used for other purposes; ask your health care provider or pharmacist if you have questions. COMMON BRAND NAME(S): Neulasta What should I tell my health care provider before I take this medicine? They need to know if you have any of these conditions: -kidney disease -latex allergy -ongoing radiation therapy -sickle cell disease -skin reactions to acrylic adhesives (On-Body Injector only) -an unusual or allergic reaction to pegfilgrastim, filgrastim, other medicines, foods, dyes, or preservatives -pregnant or trying to get pregnant -breast-feeding How should I use this medicine? This medicine is for injection under the skin. If you get this medicine at home, you will be taught how to prepare and give the pre-filled syringe or how to use the On-body Injector. Refer to the patient Instructions for Use for detailed instructions. Use exactly as directed. Tell your healthcare provider immediately if you suspect that the On-body Injector may not have performed as intended or if you suspect the use of the On-body Injector resulted in a missed or partial dose. It is important that you put your used needles and syringes in a special sharps container. Do not put them in a trash can. If you do not have a sharps container, call your pharmacist or healthcare provider to get one. Talk to your pediatrician regarding the use of this medicine in children. While this drug may be prescribed for selected conditions,  precautions do apply. Overdosage: If you think you have taken too much of this medicine contact a poison control center or emergency room at once. NOTE: This medicine is only for you. Do not share this medicine with others. What if I miss a dose? It is important not to miss your dose. Call your doctor or health care professional if you miss your dose. If you miss a dose due to an On-body Injector failure or leakage, a new dose should be administered as soon as possible using a single prefilled syringe for manual use. What may interact with this medicine? Interactions have not been studied. Give your health care provider a list of all the medicines, herbs, non-prescription drugs, or dietary supplements you use. Also tell them if you smoke, drink alcohol, or use illegal drugs. Some items may interact with your medicine. This list may not describe all possible interactions. Give your health care provider a list of all the medicines, herbs, non-prescription drugs, or dietary supplements you use. Also tell them if you smoke, drink alcohol, or use illegal drugs. Some items may interact with your medicine. What should I watch for while using this medicine? You may need blood work done while you are taking this medicine. If you are going to need a MRI, CT scan, or other procedure, tell your doctor that you are using this medicine (On-Body Injector only). What side effects may I notice from receiving this medicine? Side effects that you should report to your doctor or health care professional as soon as possible: -allergic reactions like skin rash, itching or hives, swelling of the face, lips, or tongue -dizziness -fever -pain, redness, or irritation at site   where injected -pinpoint red spots on the skin -red or dark-brown urine -shortness of breath or breathing problems -stomach or side pain, or pain at the shoulder -swelling -tiredness -trouble passing urine or change in the amount of urine Side  effects that usually do not require medical attention (report to your doctor or health care professional if they continue or are bothersome): -bone pain -muscle pain This list may not describe all possible side effects. Call your doctor for medical advice about side effects. You may report side effects to FDA at 1-800-FDA-1088. Where should I keep my medicine? Keep out of the reach of children. Store pre-filled syringes in a refrigerator between 2 and 8 degrees C (36 and 46 degrees F). Do not freeze. Keep in carton to protect from light. Throw away this medicine if it is left out of the refrigerator for more than 48 hours. Throw away any unused medicine after the expiration date. NOTE: This sheet is a summary. It may not cover all possible information. If you have questions about this medicine, talk to your doctor, pharmacist, or health care provider.  2018 Elsevier/Gold Standard (2016-05-14 12:58:03)  

## 2018-04-14 ENCOUNTER — Encounter: Payer: Self-pay | Admitting: *Deleted

## 2018-04-14 ENCOUNTER — Telehealth: Payer: Self-pay | Admitting: Adult Health

## 2018-04-14 ENCOUNTER — Inpatient Hospital Stay: Payer: BC Managed Care – PPO

## 2018-04-14 ENCOUNTER — Encounter: Payer: Self-pay | Admitting: Adult Health

## 2018-04-14 ENCOUNTER — Inpatient Hospital Stay (HOSPITAL_BASED_OUTPATIENT_CLINIC_OR_DEPARTMENT_OTHER): Payer: BC Managed Care – PPO | Admitting: Adult Health

## 2018-04-14 VITALS — BP 108/62 | HR 92 | Temp 98.3°F | Resp 18 | Ht 69.0 in | Wt 210.0 lb

## 2018-04-14 DIAGNOSIS — Z79899 Other long term (current) drug therapy: Secondary | ICD-10-CM

## 2018-04-14 DIAGNOSIS — C50412 Malignant neoplasm of upper-outer quadrant of left female breast: Secondary | ICD-10-CM

## 2018-04-14 DIAGNOSIS — Z90710 Acquired absence of both cervix and uterus: Secondary | ICD-10-CM | POA: Diagnosis not present

## 2018-04-14 DIAGNOSIS — Z7982 Long term (current) use of aspirin: Secondary | ICD-10-CM

## 2018-04-14 DIAGNOSIS — Z171 Estrogen receptor negative status [ER-]: Principal | ICD-10-CM

## 2018-04-14 LAB — COMPREHENSIVE METABOLIC PANEL
ALBUMIN: 3.3 g/dL — AB (ref 3.5–5.0)
ALK PHOS: 91 U/L (ref 38–126)
ALT: 7 U/L (ref 0–44)
ANION GAP: 8 (ref 5–15)
AST: 7 U/L — AB (ref 15–41)
BILIRUBIN TOTAL: 1.3 mg/dL — AB (ref 0.3–1.2)
BUN: 8 mg/dL (ref 6–20)
CALCIUM: 9 mg/dL (ref 8.9–10.3)
CO2: 28 mmol/L (ref 22–32)
Chloride: 103 mmol/L (ref 98–111)
Creatinine, Ser: 0.63 mg/dL (ref 0.44–1.00)
GFR calc Af Amer: >60 mL/min (ref >60)
GLUCOSE: 93 mg/dL (ref 70–99)
Potassium: 3.9 mmol/L (ref 3.5–5.1)
Sodium: 139 mmol/L (ref 135–145)
Total Protein: 6.6 g/dL (ref 6.5–8.1)

## 2018-04-14 LAB — CBC WITH DIFFERENTIAL/PLATELET
Abs Immature Granulocytes: 0.01 10*3/uL (ref 0.00–0.07)
BASOS PCT: 0 %
Basophils Absolute: 0 10*3/uL (ref 0.0–0.1)
EOS ABS: 0.1 10*3/uL (ref 0.0–0.5)
Eosinophils Relative: 6 %
HCT: 40.5 % (ref 36.0–46.0)
Hemoglobin: 12.9 g/dL (ref 12.0–15.0)
Immature Granulocytes: 1 %
Lymphocytes Relative: 67 %
Lymphs Abs: 0.6 10*3/uL — ABNORMAL LOW (ref 0.7–4.0)
MCH: 30.1 pg (ref 26.0–34.0)
MCHC: 31.9 g/dL (ref 30.0–36.0)
MCV: 94.4 fL (ref 80.0–100.0)
MONO ABS: 0.1 10*3/uL (ref 0.1–1.0)
MONOS PCT: 9 %
Neutro Abs: 0.2 10*3/uL — CL (ref 1.7–7.7)
Neutrophils Relative %: 17 %
PLATELETS: 107 10*3/uL — AB (ref 150–400)
RBC: 4.29 MIL/uL (ref 3.87–5.11)
RDW: 11.2 % — AB (ref 11.5–15.5)
WBC: 0.9 10*3/uL — AB (ref 4.0–10.5)
nRBC: 0 % (ref 0.0–0.2)

## 2018-04-14 MED ORDER — CIPROFLOXACIN HCL 500 MG PO TABS: 500.0000 mg | ORAL_TABLET | Freq: Two times a day (BID) | ORAL | 0 refills | Status: DC

## 2018-04-14 NOTE — Progress Notes (Signed)
[facility]  [phone] [phone]     ID: Paula Berry DOB: [birth year]  MR#: [id number]  TKW#:[id number]  Patient Care Team: Vernie Shanks, MD as PCP - General (Family Medicine) Rolm Bookbinder, MD as Consulting Physician (General Surgery) Magrinat, Virgie Dad, MD as Consulting Physician (Oncology) Eppie Gibson, MD as Attending Physician (Radiation Oncology) Everlene Farrier, MD as Consulting Physician (Obstetrics and Gynecology) Scot Dock, NP OTHER MD:  CHIEF COMPLAINT: Triple negative breast cancer  CURRENT TREATMENT: Neoadjuvant chemotherapy   HISTORY OF CURRENT ILLNESS: From the original intake note:  The patient had a left mammogram with tomography and ultrasonography 07/12/2015 at the [facility] after screening recall for a possible left breast mass.  47 found the breast density to be category C.  There was a small lobulated mass in the lower outer aspect of the left breast, which was not palpable.  Ultrasound showed a small lobulated cyst in that area measuring 0.6 cm.  This was felt to be benign.  In July 2019 she had screening mammography at Dr. Clementeen Hoof office.  I do not have that report but according to the patient it was unremarkable.  In October 2019 however the patient developed left axillary swelling and tenderness and was again set up for left mammography with tomography and left breast ultrasonography at the [facility], 03/11/2018.  Breast density was category C.  Mammography showed numerous markedly enlarged left axillary lymph nodes.  There was periareolar skin thickening.  There was an area of ill-defined distortion in the upper outer left breast and on physical exam there was a firm lump superior and lateral to the left nipple, with swelling of the left axilla.  Ultrasound of the left breast and both upper quadrants found an ill-defined irregular hypoechoic area measuring at least 4.2 cm.  There also  multiple scattered cysts.  Ultrasound of the left axilla showed at least 6 abnormal lymph nodes.  On 03/16/2018 the patient underwent right mammography with tomography and right breast ultrasonography at the [facility].  Again breast density was category C.  There were multiple circumscribed equal density masses in the upper outer quadrant of the right breast which appeared stable.  Ultrasound showed diffuse fibrocystic changes.  An additional hypoechoic mass was noted at the 9 o'clock position 3 cm from the nipple measuring 0.8 cm, with no associated vascularity.  A complex solid and cystic mass was noted superficially at the 6 o'clock position 3 cm from the nipple measuring 1.6 cm.  Evaluation of the right axilla was negative.  On 03/18/2018 the patient underwent biopsy of the 2 suspicious areas in the right breast, and they both showed only fibrocystic changes with some chronic inflammation ([id number] [id number]).  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  The patient's subsequent history is as detailed below.  INTERVAL HISTORY: Shandreka returns to the breast cancer clinic on 46/14/2019 accompanied by her husband. She is here for evaluation after receiving her first cycle of neoadjuvant chemotherapy with Doxorubicin and Cyclophosphamide given dose dense on day 1 of a 14 day cycle with Udenyca support.  She tells me she is feeling well today.    REVIEW OF SYSTEMS: Briony tells me that she did well with treatment.  She feels fatigued, and notes she took time  to take care of herself and rest when she needed to.  She has remained active when possible as well.  She took her anti emetics as prescribed and notes no significant nausea and no vomiting.  Her bowels have remained normal.  She  hasn't noted any fever, chills, mouth ulcerations, or new pain.  Otherwise, a detailed ROS was conducted and was non contributory.     PAST MEDICAL HISTORY: Past Medical History:  Diagnosis Date  . Anemia    had iron infusion 08/12/15  . Asthma   . Cancer [facility])    Breast cancer   . Genital herpes   . GERD (gastroesophageal reflux disease)   . Headache    migraines 2-3 times monthly  . Pneumonia    hospitalizied for pneumonia as a child    PAST SURGICAL HISTORY: Past Surgical History:  Procedure Laterality Date  . ABDOMINAL HYSTERECTOMY Bilateral 08/22/2015   Procedure: HYSTERECTOMY ABDOMINAL, BILATERAL SALPINGECTOMY;  Surgeon: Everlene Farrier, MD;  Location: [facility];  Service: Gynecology;  Laterality: Bilateral;  . MYOMECTOMY ABDOMINAL APPROACH    . PORTACATH PLACEMENT N/A 03/30/2018   Procedure: INSERTION PORT-A-CATH WITH [place];  Surgeon: Rolm Bookbinder, MD;  Location: [facility];  Service: General;  Laterality: N/A;    FAMILY HISTORY Family History  Problem Relation Age of Onset  . Hypertension Unknown   . Diabetes Unknown   . Cancer Unknown   . Dementia Maternal Grandfather   . Lung cancer Paternal Grandmother   . Brain cancer Paternal Aunt   . Migraines Neg Hx   The patient's parents are in their early 46s as of October 2019.  A paternal aunt had brain cancer at age 46.  A paternal grandmother had lung cancer at age 46.  There is no breast or ovarian cancer history in the family.  However note that the patient has little information on her mother side of the family.  GYNECOLOGIC HISTORY:  No LMP recorded. Patient has had a hysterectomy. 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  Her husband [name]. works for  the [employer].  He is a former patient here, with stage IV colon cancer.  They live alone, with no pets.    ADVANCED DIRECTIVES: Not in place   HEALTH MAINTENANCE: Social History   Tobacco Use  . Smoking status: Never Smoker  . Smokeless tobacco: Never Used  Substance Use Topics  . Alcohol use: Yes    Alcohol/week: 1.0 standard drinks    Types: 1 Glasses of wine per week    Comment: daily  . Drug use: No     Colonoscopy: Never  PAP: Status post hysterectomy  Bone density: Never   Allergies  Allergen Reactions  . Neosporin [Neomycin-Bacitracin Zn-Polymyx] Other (See Comments)    Decreased wound healing with blisters    Current Outpatient Medications  Medication Sig Dispense Refill  . acetaminophen (TYLENOL) 500 MG tablet Take 1,000 mg by mouth every 8 (eight) hours as needed for moderate pain.    Marland Kitchen acyclovir ointment (ZOVIRAX) 5 % Apply 1 application topically 4 (four) times daily as needed (outbreaks).   2  . albuterol (PROVENTIL HFA;VENTOLIN HFA) 108 (90 Base) MCG/ACT inhaler Inhale 2 puffs into the lungs every 6 (six) hours  as needed for wheezing or shortness of breath.     . Chlorphen-Phenyleph-ASA (ALKA-SELTZER PLUS COLD PO) Take 1-2 tablets by mouth daily as needed (allergies).    Marland Kitchen dexamethasone (DECADRON) 4 MG tablet Take 2 tablets by mouth once a day on the day after chemotherapy and then take 2 tablets two times a day for 2 days. Take with food. 30 tablet 1  . lidocaine-prilocaine (EMLA) cream Apply to affected area once 30 g 3  . LORazepam (ATIVAN) 0.5 MG tablet Take 1 tablet (0.5 mg total) by mouth at bedtime as needed (Nausea or vomiting). 30 tablet 0  . methocarbamol (ROBAXIN) 500 MG tablet Take 500 mg by mouth 3 (three) times daily as needed for muscle spasms (pain).    . montelukast (SINGULAIR) 10 MG tablet Take 10 mg by mouth every evening.    Marland Kitchen oxyCODONE (OXY IR/ROXICODONE) 5 MG immediate release tablet Take 1 tablet (5 mg total) by mouth every 6  (six) hours as needed for moderate pain, severe pain or breakthrough pain. 8 tablet 0  . pantoprazole (PROTONIX) 40 MG tablet Take 40 mg by mouth every evening.    . prochlorperazine (COMPAZINE) 10 MG tablet Take 1 tablet (10 mg total) by mouth every 6 (six) hours as needed (Nausea or vomiting). 30 tablet 1  . SYMBICORT 160-4.5 MCG/ACT inhaler INL 2 PFS PO BID  3  . valACYclovir (VALTREX) 500 MG tablet Take 500 mg by mouth See admin instructions. Take 500 mg twice daily for 3 days at onset of outbreak then take 500 mg once daily until clear     No current facility-administered medications for this visit.     OBJECTIVE: Young African-American woman in no acute distress  Vitals:   04/14/18 1309  BP: 108/62  Pulse: 92  Resp: 18  Temp: 98.3 F (36.8 C)  SpO2: 100%     Body mass index is 31.01 kg/m.   Wt Readings from Last 3 Encounters:  04/14/18 210 lb (95.3 kg)  04/01/18 214 lb 1.6 oz (97.1 kg)  03/30/18 215 lb (97.5 kg)  ECOG FS:1 GENERAL: Patient is a well appearing female in no acute distress HEENT:  Sclerae anicteric.  Oropharynx clear and moist. No ulcerations or evidence of oropharyngeal candidiasis. Neck is supple.  NODES:  No cervical, supraclavicular, or axillary lymphadenopathy palpated.  BREAST EXAM:  Left breast mass noted at 12-1 oclock, left large axillary mass noted LUNGS:  Clear to auscultation bilaterally.  No wheezes or rhonchi. HEART:  Regular rate and rhythm. No murmur appreciated. ABDOMEN:  Soft, nontender.  Positive, normoactive bowel sounds. No organomegaly palpated. MSK:  No focal spinal tenderness to palpation. Full range of motion bilaterally in the upper extremities. EXTREMITIES:  No peripheral edema.   SKIN:  Clear with no obvious rashes or skin changes. No nail dyscrasia. NEURO:  Nonfocal. Well oriented.  Appropriate affect.   LAB RESULTS:  CMP     Component Value Date/Time   NA 139 04/14/2018 1233   K 3.9 04/14/2018 1233   CL 103 04/14/2018  1233   CO2 28 04/14/2018 1233   GLUCOSE 93 04/14/2018 1233   BUN 8 04/14/2018 1233   CREATININE 0.63 04/14/2018 1233   CREATININE 0.67 03/23/2018 0855   CALCIUM 9.0 04/14/2018 1233   PROT 6.6 04/14/2018 1233   ALBUMIN 3.3 (L) 04/14/2018 1233   AST 7 (L) 04/14/2018 1233   AST 15 03/23/2018 0855   ALT 7 04/14/2018 1233   ALT 17 03/23/2018 0855  ALKPHOS 91 04/14/2018 1233   BILITOT 1.3 (H) 04/14/2018 1233   BILITOT 1.0 03/23/2018 0855   GFRNONAA >60 04/14/2018 1233   GFRNONAA >60 03/23/2018 0855   GFRAA >60 04/14/2018 1233   GFRAA >60 03/23/2018 0855    No results found for: Ronnald Ramp, A1GS, A2GS, BETS, BETA2SER, GAMS, MSPIKE, SPEI  No results found for: Nils Pyle, [facility]  Lab Results  Component Value Date   WBC 0.9 (LL) 04/14/2018   NEUTROABS PENDING 04/14/2018   HGB 12.9 04/14/2018   HCT 40.5 04/14/2018   MCV 94.4 04/14/2018   PLT 107 (L) 04/14/2018      Chemistry      Component Value Date/Time   NA 139 04/14/2018 1233   K 3.9 04/14/2018 1233   CL 103 04/14/2018 1233   CO2 28 04/14/2018 1233   BUN 8 04/14/2018 1233   CREATININE 0.63 04/14/2018 1233   CREATININE 0.67 03/23/2018 0855      Component Value Date/Time   CALCIUM 9.0 04/14/2018 1233   ALKPHOS 91 04/14/2018 1233   AST 7 (L) 04/14/2018 1233   AST 15 03/23/2018 0855   ALT 7 04/14/2018 1233   ALT 17 03/23/2018 0855   BILITOT 1.3 (H) 04/14/2018 1233   BILITOT 1.0 03/23/2018 0855       No results found for: LABCA2  No components found for: WIOXBD532  No results for input(s): INR in the last 168 hours.  No results found for: LABCA2  No results found for: CAN199  No results found for: DJM426  No results found for: STM196  Lab Results  Component Value Date   CA2729 213.7 (H) 04/07/2018    No components found for: HGQUANT  No results found for: CEA1 / No results found for: CEA1   No results found for: AFPTUMOR  No results found for: [place]  No  results found for: PSA1  Appointment on 04/14/2018  Component Date Value Ref Range Status  . Sodium 04/14/2018 139  135 - 145 mmol/L Final  . Potassium 04/14/2018 3.9  3.5 - 5.1 mmol/L Final  . Chloride 04/14/2018 103  98 - 111 mmol/L Final  . CO2 04/14/2018 28  22 - 32 mmol/L Final  . Glucose, Bld 04/14/2018 93  70 - 99 mg/dL Final  . BUN 04/14/2018 8  6 - 20 mg/dL Final  . Creatinine, Ser 04/14/2018 0.63  0.44 - 1.00 mg/dL Final  . Calcium 04/14/2018 9.0  8.9 - 10.3 mg/dL Final  . Total Protein 04/14/2018 6.6  6.5 - 8.1 g/dL Final  . Albumin 04/14/2018 3.3* 3.5 - 5.0 g/dL Final  . AST 04/14/2018 7* 15 - 41 U/L Final  . ALT 04/14/2018 7  0 - 44 U/L Final  . Alkaline Phosphatase 04/14/2018 91  38 - 126 U/L Final  . Total Bilirubin 04/14/2018 1.3* 0.3 - 1.2 mg/dL Final  . GFR calc non Af Amer 04/14/2018 >60  >60 mL/min Final  . GFR calc Af Amer 04/14/2018 >60  >60 mL/min Final   Comment: (NOTE) The eGFR has been calculated using the CKD EPI equation. This calculation has not been validated in all clinical situations. eGFR's persistently <60 mL/min signify possible Chronic Kidney Disease.   Georgiann Hahn gap 04/14/2018 8  5 - 15 Final   Performed at [facility], [place] [address] [place], [place] [address]  . WBC 04/14/2018 0.9* 4.0 - 10.5 K/uL Final   This critical result has verified and been called to VAL DODD, RN  by Modena Slater on 11 14 2019 at 1245, and has been read back.   Marland Kitchen RBC 04/14/2018 4.29  3.87 - 5.11 MIL/uL Final  . Hemoglobin 04/14/2018 12.9  12.0 - 15.0 g/dL Final  . HCT 04/14/2018 40.5  36.0 - 46.0 % Final  . MCV 04/14/2018 94.4  80.0 - 100.0 fL Final  . MCH 04/14/2018 30.1  26.0 - 34.0 pg Final  . MCHC 04/14/2018 31.9  30.0 - 36.0 g/dL Final  . RDW 04/14/2018 11.2* 11.5 - 15.5 % Final  . Platelets 04/14/2018 107* 150 - 400 K/uL Final  . nRBC 04/14/2018 0.0  0.0 - 0.2 % Final   Performed at [facility], [place]  [address] [place], [place] [address]  . Neutrophils Relative % 04/14/2018 PENDING  % Incomplete  . Neutro Abs 04/14/2018 PENDING  1.7 - 7.7 K/uL Incomplete  . Band Neutrophils 04/14/2018 PENDING  % Incomplete  . Lymphocytes Relative 04/14/2018 PENDING  % Incomplete  . Lymphs Abs 04/14/2018 PENDING  0.7 - 4.0 K/uL Incomplete  . Monocytes Relative 04/14/2018 PENDING  % Incomplete  . Monocytes Absolute 04/14/2018 PENDING  0.1 - 1.0 K/uL Incomplete  . Eosinophils Relative 04/14/2018 PENDING  % Incomplete  . Eosinophils Absolute 04/14/2018 PENDING  0.0 - 0.5 K/uL Incomplete  . Basophils Relative 04/14/2018 PENDING  % Incomplete  . Basophils Absolute 04/14/2018 PENDING  0.0 - 0.1 K/uL Incomplete  . WBC Morphology 04/14/2018 PENDING   Incomplete  . RBC Morphology 04/14/2018 PENDING   Incomplete  . Smear Review 04/14/2018 PENDING   Incomplete  . Other 04/14/2018 PENDING  % Incomplete  . nRBC 04/14/2018 PENDING  0 /100 WBC Incomplete  . Metamyelocytes Relative 04/14/2018 PENDING  % Incomplete  . Myelocytes 04/14/2018 PENDING  % Incomplete  . Promyelocytes Relative 04/14/2018 PENDING  % Incomplete  . Blasts 04/14/2018 PENDING  % Incomplete    (this displays the last labs from the last 3 days)  No results found for: TOTALPROTELP, ALBUMINELP, A1GS, A2GS, BETS, BETA2SER, GAMS, MSPIKE, SPEI (this displays SPEP labs)  No results found for: KPAFRELGTCHN, LAMBDASER, KAPLAMBRATIO (kappa/lambda light chains)  No results found for: HGBA, HGBA2QUANT, HGBFQUANT, HGBSQUAN (Hemoglobinopathy evaluation)   No results found for: LDH  No results found for: IRON, TIBC, IRONPCTSAT (Iron and TIBC)  No results found for: FERRITIN  Urinalysis No results found for: COLORURINE, APPEARANCEUR, LABSPEC, PHURINE, GLUCOSEU, HGBUR, BILIRUBINUR, KETONESUR, PROTEINUR, UROBILINOGEN, NITRITE, LEUKOCYTESUR   STUDIES:   ELIGIBLE FOR AVAILABLE RESEARCH PROTOCOL:  May eventually qualify for BR004; UPBEAT  ASSESSMENT:  47 y.o. [place] woman status post left breast overlapping site biopsy and left axillary lymph node biopsy 03/16/2018 for a clinical T2 N3, stage IIIC invasive ductal carcinoma, triple negative, with an MIB-1 of 15%  (1) genetics testing pending  (2) neoadjuvant chemotherapy to consist of doxorubicin and cyclophosphamide in dose dense fashion x4 followed by weekly carboplatin and paclitaxel x12  (3) definitive surgery to follow  (4) adjuvant radiation  PLAN: Perris is doing well today.  She tolerated her first cycle of Doxorubicin and Cyclophosphamide well and I am happy that she had minimal adverse effects.  She is neutropenic today with an ANC of 0.2.  I reviewed this with her and that she is at increased risk for infection.  We reviewed neutropenic precautions in detail, and I prescribed prophylactic cipro for her to take.  She knows that if she has a fever to call us immediately.    I reviewed Thomas[place] and  her nadir CBC with Dr. Jana Hakim today.  He does not want to dose reduce her chemotherapy with cycle 2.    Linsi will return in one week for labs, f/u and cycle 2 of Doxorubicin and Cyclophosphamide.  She knows to call for any questions or concerns prior to her next appointment with [place].    A total of (30) minutes of face-to-face time was spent with this patient with greater than 50% of that time in counseling and care-coordination.    Scot Dock, NP   04/14/2018 1:10 PM Medical Oncology and Hematology [facility] [address] [place], [place] [address] Tel. [phone]    Fax. [phone]

## 2018-04-14 NOTE — Telephone Encounter (Signed)
Gave patient avs and calendar. Per 11/14 los, let LC know that we could not add lab on 11/27.  Also, overbook override requested for GM @ 11:00 on 12/19.

## 2018-04-16 ENCOUNTER — Other Ambulatory Visit: Payer: Self-pay | Admitting: Oncology

## 2018-04-18 ENCOUNTER — Inpatient Hospital Stay (HOSPITAL_BASED_OUTPATIENT_CLINIC_OR_DEPARTMENT_OTHER): Payer: BC Managed Care – PPO | Admitting: Genetic Counselor

## 2018-04-18 ENCOUNTER — Inpatient Hospital Stay: Payer: BC Managed Care – PPO

## 2018-04-18 ENCOUNTER — Encounter: Payer: Self-pay | Admitting: Genetic Counselor

## 2018-04-18 DIAGNOSIS — Z801 Family history of malignant neoplasm of trachea, bronchus and lung: Secondary | ICD-10-CM

## 2018-04-18 DIAGNOSIS — Z171 Estrogen receptor negative status [ER-]: Principal | ICD-10-CM

## 2018-04-18 DIAGNOSIS — C50412 Malignant neoplasm of upper-outer quadrant of left female breast: Secondary | ICD-10-CM

## 2018-04-18 DIAGNOSIS — Z808 Family history of malignant neoplasm of other organs or systems: Secondary | ICD-10-CM | POA: Diagnosis not present

## 2018-04-18 NOTE — Progress Notes (Signed)
REFERRING PROVIDER: Chauncey Cruel, MD [address] [place], [place] [address]  PRIMARY PROVIDER:  Vernie Shanks, MD  PRIMARY REASON FOR VISIT:  1. Malignant neoplasm of upper-outer quadrant of left breast in female, estrogen receptor negative ([place])      HISTORY OF PRESENT ILLNESS:   Paula Berry, a 47 y.o. female, was seen for a [facility] cancer genetics consultation at the request of Dr. Jana Hakim due to a personal and family history of cancer.  Paula Berry presents to clinic today to discuss the possibility of a hereditary predisposition to cancer, genetic testing, and to further clarify her future cancer risks, as well as potential cancer risks for family members.   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 This was treated with chemotherapy, surgery and radiation.    CANCER HISTORY:    Malignant neoplasm of upper-outer quadrant of left breast in female, estrogen receptor negative ([place])   03/21/2018 Initial Diagnosis    Malignant neoplasm of upper-outer quadrant of left breast in female, estrogen receptor negative ([place])    04/06/2018 -  Chemotherapy    The patient had DOXOrubicin (ADRIAMYCIN) chemo injection 130 mg, 60 mg/m2 = 130 mg, Intravenous,  Once, 1 of 4 cycles Administration: 130 mg (04/07/2018) palonosetron (ALOXI) injection 0.25 mg, 0.25 mg, Intravenous,  Once, 1 of 8 cycles Administration: 0.25 mg (04/07/2018) pegfilgrastim-cbqv (UDENYCA) injection 6 mg, 6 mg, Subcutaneous, Once, 1 of 4 cycles Administration: 6 mg (04/09/2018) CARBOplatin (PARAPLATIN) in sodium chloride 0.9 % 100 mL chemo infusion, , Intravenous,  Once, 0 of 4 cycles cyclophosphamide (CYTOXAN) 1,300 mg in sodium chloride 0.9 % 250 mL chemo infusion, 600 mg/m2 = 1,300 mg, Intravenous,  Once, 1 of 4 cycles Administration: 1,300 mg (04/07/2018) PACLitaxel (TAXOL) 174 mg in sodium  chloride 0.9 % 250 mL chemo infusion (</= 50m/m2), 80 mg/m2, Intravenous,  Once, 0 of 4 cycles fosaprepitant (EMEND) 150 mg, dexamethasone (DECADRON) 12 mg in sodium chloride 0.9 % 145 mL IVPB, , Intravenous,  Once, 1 of 8 cycles Administration:  (04/07/2018)  for chemotherapy treatment.       HORMONAL RISK FACTORS:  Menarche was at age 46-11  First live birth at age N/A.  OCP use for approximately 5-6 years.  Ovaries intact: yes.  Hysterectomy: yes.  Menopausal status: premenopausal.  HRT use: 0 years. Colonoscopy: no; not examined. Mammogram within the last year: yes. Number of breast biopsies: 1. Up to date with pelvic exams:  yes. Any excessive radiation exposure in the past:  no  Past Medical History:  Diagnosis Date  . Anemia    had iron infusion 08/12/15  . Asthma   . Cancer ([facility]    Breast cancer   . Genital herpes   . GERD (gastroesophageal reflux disease)   . Headache    migraines 2-3 times monthly  . Pneumonia    hospitalizied for pneumonia as a child    Past Surgical History:  Procedure Laterality Date  . ABDOMINAL HYSTERECTOMY Bilateral 08/22/2015   Procedure: HYSTERECTOMY ABDOMINAL, BILATERAL SALPINGECTOMY;  Surgeon: JEverlene Farrier MD;  [facility];  Service: Gynecology;  Laterality: Bilateral;  . MYOMECTOMY ABDOMINAL APPROACH    . PORTACATH PLACEMENT N/A 03/30/2018   Procedure: INSERTION PORT-A-CATH WITH [place]  Surgeon: WRolm Bookbinder MD;  [facility]  Service: General;  Laterality: N/A;    Social History   Socioeconomic History  .  Marital status: Married    Spouse name: Edd Arbour  . Number of children: 0  . Years of education: 31  . Highest education level: Not on file  Occupational History  . Occupation: [employer]  . Financial resource strain: Not on file  . Food insecurity:    Worry: Not on file    Inability: Not on file  . Transportation needs:    Medical: Not on file    Non-medical: Not on file   Tobacco Use  . Smoking status: Never Smoker  . Smokeless tobacco: Never Used  Substance and Sexual Activity  . Alcohol use: Yes    Alcohol/week: 1.0 standard drinks    Types: 1 Glasses of wine per week    Comment: daily  . Drug use: No  . Sexual activity: Not on file  Lifestyle  . Physical activity:    Days per week: Not on file    Minutes per session: Not on file  . Stress: Not on file  Relationships  . Social connections:    Talks on phone: Not on file    Gets together: Not on file    Attends religious service: Not on file    Active member of club or organization: Not on file    Attends meetings of clubs or organizations: Not on file    Relationship status: Not on file  Other Topics Concern  . Not on file  Social History Narrative   Lives with spouse   Caffeine use: 1 cup coffee/day (5 days per week)   No soda often, unless she has a headache (2x/month)   Tea- rarely      FAMILY HISTORY:  We obtained a detailed, 4-generation family history.  Significant diagnoses are listed below: Family History  Problem Relation Age of Onset  . Hypertension Unknown   . Diabetes Cousin        mat first cousin  . Cancer Unknown   . Dementia Maternal Grandfather   . Lung cancer Paternal Grandmother        d. 29-60  . Brain cancer Paternal Aunt        d. 54, father's mat 1/2 sister  . Diabetes Maternal Grandmother   . Other Paternal Grandfather        d. WWII  . Aneurysm Maternal Uncle        brain  . Migraines Neg Hx     The patient does not have children.  She has one full sister and a paternal half sister who are cancer free.  Her parents are both living.  The patient's father has a full brother and sister, and two maternal half sisters and half brother.  His full sister died of an acute illness in her teens.  One half sister died around 51 from a brain tumor.  The paternal grandparents are deceased.  The grandfather died in WWII and the grandmother died of lung cancer at  age 15-60.  The patient's mother is living.  She had two brothers and a sister who are all cancer free.  The maternal grandparents are deceased, the grandmother from diabetes complications and the grandfather from dementia.  Paula Berry is unaware of previous family history of genetic testing for hereditary cancer risks. Patient's maternal ancestors are of African American descent, and paternal ancestors are of African American descent. There is no reported Ashkenazi Jewish ancestry. There is no known consanguinity.  GENETIC COUNSELING ASSESSMENT: Marli Diego is a 47  y.o. female with a personal history of triple negative breast cancer which is somewhat suggestive of a hereditary cancer syndrome and predisposition to cancer. We, therefore, discussed and recommended the following at today's visit.   DISCUSSION: We discussed that about 5-10% of breast cancer is hereditary with most cases due to BRCA mutations.  Other genes associated with triple negative young breast cancer include the PALB2 gene as well.  We reviewed the characteristics, features and inheritance patterns of hereditary cancer syndromes. We also discussed genetic testing, including the appropriate family members to test, the process of testing, insurance coverage and turn-around-time for results. We discussed the implications of a negative, positive and/or variant of uncertain significant result. We recommended Ms. Heidi Dach pursue genetic testing for the multi cancer gene panel. The Multi-Gene Panel offered by [facility] includes sequencing and/or deletion duplication testing of the following 84 genes: AIP, ALK, APC, ATM, AXIN2,BAP1,  BARD1, BLM, BMPR1A, BRCA1, BRCA2, BRIP1, CASR, CDC73, CDH1, CDK4, CDKN1B, CDKN1C, CDKN2A (p14ARF), CDKN2A (p16INK4a), CEBPA, CHEK2, CTNNA1, DICER1, DIS3L2, EGFR (c.2369C>T, p.Thr790Met variant only), EPCAM (Deletion/duplication testing only), FH, FLCN, GATA2, GPC3, GREM1 (Promoter region  deletion/duplication testing only), HOXB13 (c.251G>A, p.Gly84Glu), HRAS, KIT, MAX, MEN1, MET, MITF (c.952G>A, p.Glu318Lys variant only), MLH1, MSH2, MSH3, MSH6, MUTYH, NBN, NF1, NF2, NTHL1, PALB2, PDGFRA, PHOX2B, PMS2, POLD1, POLE, POT1, PRKAR1A, PTCH1, PTEN, RAD50, RAD51C, RAD51D, RB1, RECQL4, RET, RUNX1, SDHAF2, SDHA (sequence changes only), SDHB, SDHC, SDHD, SMAD4, SMARCA4, SMARCB1, SMARCE1, STK11, SUFU, TERC, TERT, TMEM127, TP53, TSC1, TSC2, VHL, WRN and WT1.    Based on Ms. Wanamaker's personal and family history of cancer, she meets medical criteria for genetic testing. Despite that she meets criteria, she may still have an out of pocket cost. We discussed that if her out of pocket cost for testing is over $100, the laboratory will call and confirm whether she wants to proceed with testing.  If the out of pocket cost of testing is less than $100 she will be billed by the genetic testing laboratory.   PLAN: After considering the risks, benefits, and limitations, Ms. Rubee Vega  provided informed consent to pursue genetic testing and the blood sample was sent to [facility] for analysis of the multi cancer gene panel. Results should be available within approximately 2-3 weeks' time, at which point they will be disclosed by telephone to Ms. [employer], as will any additional recommendations warranted by these results. Ms. Sokhna Christoph will receive a summary of her genetic counseling visit and a copy of her results once available. This information will also be available in Epic. We encouraged Ms. Heidi Dach to remain in contact with cancer genetics annually so that we can continuously update the family history and inform her of any changes in cancer genetics and testing that may be of benefit for her family. Ms. Sabra Heck Crite's questions were answered to her satisfaction today. Our contact information was provided should additional questions or concerns arise.  Lastly, we encouraged Ms.  Heidi Dach to remain in contact with cancer genetics annually so that we can continuously update the family history and inform her of any changes in cancer genetics and testing that may be of benefit for this family.   Ms.  Sabra Heck Crite's questions were answered to her satisfaction today. Our contact information was provided should additional questions or concerns arise. Thank you for the referral and allowing [place] to share in the care of your patient.   Karsyn Jamie P. Florene Glen, [place], [facility] Certified Genetic Counselor [email or web address] phone: [phone]  The patient was seen for a total of 45 minutes in face-to-face genetic counseling.  This patient was discussed with Drs. Magrinat, Lindi Adie and/or Burr Medico who agrees with the above.    _______________________________________________________________________ For Office Staff:  Number of people involved in session: 2 Was an Intern/ student involved with case: no

## 2018-04-19 NOTE — Progress Notes (Signed)
FMLA successfully faxed to [employer] at [phone]. Mailed copy to patient address on file. 

## 2018-04-21 ENCOUNTER — Inpatient Hospital Stay: Payer: BC Managed Care – PPO

## 2018-04-21 ENCOUNTER — Inpatient Hospital Stay (HOSPITAL_BASED_OUTPATIENT_CLINIC_OR_DEPARTMENT_OTHER): Payer: BC Managed Care – PPO | Admitting: Adult Health

## 2018-04-21 ENCOUNTER — Encounter: Payer: Self-pay | Admitting: Adult Health

## 2018-04-21 ENCOUNTER — Encounter: Payer: Self-pay | Admitting: *Deleted

## 2018-04-21 ENCOUNTER — Telehealth: Payer: Self-pay | Admitting: Adult Health

## 2018-04-21 VITALS — BP 124/68 | HR 75 | Temp 97.8°F | Resp 18 | Ht 69.0 in | Wt 214.3 lb

## 2018-04-21 DIAGNOSIS — Z171 Estrogen receptor negative status [ER-]: Secondary | ICD-10-CM | POA: Diagnosis not present

## 2018-04-21 DIAGNOSIS — C50412 Malignant neoplasm of upper-outer quadrant of left female breast: Secondary | ICD-10-CM | POA: Diagnosis not present

## 2018-04-21 DIAGNOSIS — C773 Secondary and unspecified malignant neoplasm of axilla and upper limb lymph nodes: Secondary | ICD-10-CM

## 2018-04-21 DIAGNOSIS — Z95828 Presence of other vascular implants and grafts: Secondary | ICD-10-CM

## 2018-04-21 DIAGNOSIS — R590 Localized enlarged lymph nodes: Secondary | ICD-10-CM

## 2018-04-21 DIAGNOSIS — R918 Other nonspecific abnormal finding of lung field: Secondary | ICD-10-CM

## 2018-04-21 LAB — COMPREHENSIVE METABOLIC PANEL
ALBUMIN: 3.5 g/dL (ref 3.5–5.0)
ALK PHOS: 83 U/L (ref 38–126)
ALT: 10 U/L (ref 0–44)
AST: 14 U/L — AB (ref 15–41)
Anion gap: 6 (ref 5–15)
BUN: 9 mg/dL (ref 6–20)
CO2: 25 mmol/L (ref 22–32)
CREATININE: 0.7 mg/dL (ref 0.44–1.00)
Calcium: 8.7 mg/dL — ABNORMAL LOW (ref 8.9–10.3)
Chloride: 107 mmol/L (ref 98–111)
GFR calc Af Amer: >60 mL/min (ref >60)
GFR calc non Af Amer: >60 mL/min (ref >60)
GLUCOSE: 89 mg/dL (ref 70–99)
POTASSIUM: 3.9 mmol/L (ref 3.5–5.1)
Sodium: 138 mmol/L (ref 135–145)
Total Bilirubin: 0.3 mg/dL (ref 0.3–1.2)
Total Protein: 6.7 g/dL (ref 6.5–8.1)

## 2018-04-21 LAB — CBC WITH DIFFERENTIAL/PLATELET
ABS IMMATURE GRANULOCYTES: 0.3 10*3/uL — AB (ref 0.00–0.07)
BASOS PCT: 0 %
Basophils Absolute: 0 10*3/uL (ref 0.0–0.1)
EOS PCT: 0 %
Eosinophils Absolute: 0 10*3/uL (ref 0.0–0.5)
HCT: 37.2 % (ref 36.0–46.0)
HEMOGLOBIN: 11.9 g/dL — AB (ref 12.0–15.0)
Immature Granulocytes: 4 %
Lymphocytes Relative: 16 %
Lymphs Abs: 1.3 10*3/uL (ref 0.7–4.0)
MCH: 30.1 pg (ref 26.0–34.0)
MCHC: 32 g/dL (ref 30.0–36.0)
MCV: 94.2 fL (ref 80.0–100.0)
MONO ABS: 0.8 10*3/uL (ref 0.1–1.0)
MONOS PCT: 10 %
NEUTROS ABS: 6 10*3/uL (ref 1.7–7.7)
Neutrophils Relative %: 70 %
PLATELETS: 150 10*3/uL (ref 150–400)
RBC: 3.95 MIL/uL (ref 3.87–5.11)
RDW: 11.5 % (ref 11.5–15.5)
WBC: 8.4 10*3/uL (ref 4.0–10.5)
nRBC: 0.5 % — ABNORMAL HIGH (ref 0.0–0.2)

## 2018-04-21 MED ORDER — SODIUM CHLORIDE 0.9% FLUSH
10.0000 mL | INTRAVENOUS | Status: DC | PRN
  Administered 2018-04-21: 10 mL via INTRAVENOUS
  Filled 2018-04-21: qty 10

## 2018-04-21 MED ORDER — HEPARIN SOD (PORK) LOCK FLUSH 100 UNIT/ML IV SOLN
500.0000 [IU] | Freq: Once | INTRAVENOUS | Status: AC | PRN
  Administered 2018-04-21: 500 [IU]
  Filled 2018-04-21: qty 5

## 2018-04-21 MED ORDER — PALONOSETRON HCL INJECTION 0.25 MG/5ML
0.2500 mg | Freq: Once | INTRAVENOUS | Status: AC
  Administered 2018-04-21: 0.25 mg via INTRAVENOUS

## 2018-04-21 MED ORDER — DOXORUBICIN HCL CHEMO IV INJECTION 2 MG/ML
60.0000 mg/m2 | Freq: Once | INTRAVENOUS | Status: AC
  Administered 2018-04-21: 130 mg via INTRAVENOUS
  Filled 2018-04-21: qty 65

## 2018-04-21 MED ORDER — SODIUM CHLORIDE 0.9% FLUSH
10.0000 mL | INTRAVENOUS | Status: DC | PRN
  Administered 2018-04-21: 10 mL
  Filled 2018-04-21: qty 10

## 2018-04-21 MED ORDER — SODIUM CHLORIDE 0.9 % IV SOLN
Freq: Once | INTRAVENOUS | Status: AC
  Administered 2018-04-21: 14:00:00 via INTRAVENOUS
  Filled 2018-04-21: qty 5

## 2018-04-21 MED ORDER — SODIUM CHLORIDE 0.9 % IV SOLN
Freq: Once | INTRAVENOUS | Status: AC
  Administered 2018-04-21: 13:00:00 via INTRAVENOUS
  Filled 2018-04-21: qty 250

## 2018-04-21 MED ORDER — SODIUM CHLORIDE 0.9 % IV SOLN
600.0000 mg/m2 | Freq: Once | INTRAVENOUS | Status: AC
  Administered 2018-04-21: 1300 mg via INTRAVENOUS
  Filled 2018-04-21: qty 65

## 2018-04-21 NOTE — Progress Notes (Signed)
[name]  [phone] [phone]     ID: Paula Berry DOB: Feb 25, 1971  MR#: [id number]  QHU#:[id number]  Patient Care Team: Vernie Shanks, MD as PCP - General (Family Medicine) Rolm Bookbinder, MD as Consulting Physician (General Surgery) Magrinat, Virgie Dad, MD as Consulting Physician (Oncology) Eppie Gibson, MD as Attending Physician (Radiation Oncology) Everlene Farrier, MD as Consulting Physician (Obstetrics and Gynecology) Scot Dock, NP OTHER MD:  CHIEF COMPLAINT: Triple negative breast cancer  CURRENT TREATMENT: Neoadjuvant chemotherapy   HISTORY OF CURRENT ILLNESS: From the original intake note:  The patient had a left mammogram with tomography and ultrasonography 07/12/2015 at the [facility] after screening recall for a possible left breast mass.  This found the breast density to be category C.  There was a small lobulated mass in the lower outer aspect of the left breast, which was not palpable.  Ultrasound showed a small lobulated cyst in that area measuring 0.6 cm.  This was felt to be benign.  In July 2019 she had screening mammography at Dr. Clementeen Hoof office.  I do not have that report but according to the patient it was unremarkable.  In October 2019 however the patient developed left axillary swelling and tenderness and was again set up for left mammography with tomography and left breast ultrasonography at the [facility], 03/11/2018.  Breast density was category C.  Mammography showed numerous markedly enlarged left axillary lymph nodes.  There was periareolar skin thickening.  There was an area of ill-defined distortion in the upper outer left breast and on physical exam there was a firm lump superior and lateral to the left nipple, with swelling of the left axilla.  Ultrasound of the left breast and both upper quadrants found an ill-defined irregular hypoechoic area measuring at least 4.2 cm.  There also  multiple scattered cysts.  Ultrasound of the left axilla showed at least 6 abnormal lymph nodes.  On 03/16/2018 the patient underwent right mammography with tomography and right breast ultrasonography at the [facility].  Again breast density was category C.  There were multiple circumscribed equal density masses in the upper outer quadrant of the right breast which appeared stable.  Ultrasound showed diffuse fibrocystic changes.  An additional hypoechoic mass was noted at the 9 o'clock position 3 cm from the nipple measuring 0.8 cm, with no associated vascularity.  A complex solid and cystic mass was noted superficially at the 6 o'clock position 3 cm from the nipple measuring 1.6 cm.  Evaluation of the right axilla was negative.  On 03/18/2018 the patient underwent biopsy of the 2 suspicious areas in the right breast, and they both showed only fibrocystic changes with some chronic inflammation ([id number]).  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  The patient's subsequent history is as detailed below.  INTERVAL HISTORY: Paula Berry returns to the breast cancer clinic on 04/21/2018 accompanied by her husband. She is here for evaluation prior to receiving her second of four cycles of neoadjuvant chemotherapy with Doxorubicin and Cyclophosphamide given dose dense on day 1 of a 14 day cycle with Udenyca support given on day 3.  REVIEW OF SYSTEMS: Paula Berry is doing well today.  She underwent genetic testing on Monday, 04/18/2018.  She has had some fatigue.  Otherwise Paula Berry  is feeling well.  She denies headaches or vision changes.  She is without chest pain, palpitations, cough or shortness of breath.  She denies abdominal concerns, bowel/bladder changes, nausea or vomiting.   Otherwise, a detailed ROS was conducted and is non contributory.     PAST MEDICAL HISTORY: Past Medical History:  Diagnosis Date  . Anemia    had iron infusion 08/12/15  . Asthma   . Cancer [facility])    Breast cancer   . Genital herpes   . GERD (gastroesophageal reflux disease)   . Headache    migraines 2-3 times monthly  . Pneumonia    hospitalizied for pneumonia as a child    PAST SURGICAL HISTORY: Past Surgical History:  Procedure Laterality Date  . ABDOMINAL HYSTERECTOMY Bilateral 08/22/2015   Procedure: HYSTERECTOMY ABDOMINAL, BILATERAL SALPINGECTOMY;  Surgeon: Everlene Farrier, MD;  Location: [facility];  Service: Gynecology;  Laterality: Bilateral;  . MYOMECTOMY ABDOMINAL APPROACH    . PORTACATH PLACEMENT N/A 03/30/2018   Procedure: INSERTION PORT-A-CATH WITH [place];  Surgeon: Rolm Bookbinder, MD;  Location: [facility];  Service: General;  Laterality: N/A;    FAMILY HISTORY Family History  Problem Relation Age of Onset  . Hypertension Unknown   . Diabetes Cousin        mat first cousin  . Cancer Unknown   . Dementia Maternal Grandfather   . Lung cancer Paternal Grandmother        d. 13-60  . Brain cancer Paternal Aunt        d. 18, father's mat 1/2 sister  . Diabetes Maternal Grandmother   . Other Paternal Grandfather        d. WWII  . Aneurysm Maternal Uncle        brain  . Migraines Neg Hx   The patient's parents are in their early 43s as of October 2019.  A paternal aunt had brain cancer at age 48.  A paternal grandmother had lung cancer at age 22.  There is no breast or ovarian cancer history in the family.  However note that the patient has little information on her mother side of the family.  GYNECOLOGIC HISTORY:  No LMP recorded. Patient has had a hysterectomy. 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  Her husband Paula Berry. works for the [employer].  He is a former patient here, with stage IV colon cancer.  They live alone, with no pets.    ADVANCED DIRECTIVES: Not in place   HEALTH MAINTENANCE: Social History   Tobacco Use  . Smoking status: Never Smoker  . Smokeless tobacco: Never Used  Substance Use Topics  . Alcohol use: Yes    Alcohol/week: 1.0 standard drinks    Types: 1 Glasses of wine per week    Comment: daily  . Drug use: No     Colonoscopy: Never  PAP: Status post hysterectomy  Bone density: Never   Allergies  Allergen Reactions  . Neosporin [Neomycin-Bacitracin Zn-Polymyx] Other (See Comments)    Decreased wound healing with blisters    Current Outpatient Medications  Medication Sig Dispense Refill  . albuterol (PROVENTIL HFA;VENTOLIN HFA) 108 (90 Base) MCG/ACT inhaler Inhale 2 puffs into the lungs every 6 (six) hours as needed for wheezing  or shortness of breath.     . ciprofloxacin (CIPRO) 500 MG tablet Take 1 tablet (500 mg total) by mouth 2 (two) times daily. 14 tablet 0  . dexamethasone (DECADRON) 4 MG tablet Take 2 tablets by mouth once a day on the day after chemotherapy and then take 2 tablets two times a day for 2 days. Take with food. 30 tablet 1  . lidocaine-prilocaine (EMLA) cream Apply to affected area once 30 g 3  . LORazepam (ATIVAN) 0.5 MG tablet Take 1 tablet (0.5 mg total) by mouth at bedtime as needed (Nausea or vomiting). 30 tablet 0  . montelukast (SINGULAIR) 10 MG tablet Take 10 mg by mouth every evening.    . pantoprazole (PROTONIX) 40 MG tablet Take 40 mg by mouth every evening.    . SYMBICORT 160-4.5 MCG/ACT inhaler INL 2 PFS PO BID  3  . acetaminophen (TYLENOL) 500 MG tablet Take 1,000 mg by mouth every 8 (eight) hours as needed for moderate pain.    Marland Kitchen acyclovir ointment (ZOVIRAX) 5 % Apply 1 application topically 4 (four) times  daily as needed (outbreaks).   2  . methocarbamol (ROBAXIN) 500 MG tablet Take 500 mg by mouth 3 (three) times daily as needed for muscle spasms (pain).    Marland Kitchen oxyCODONE (OXY IR/ROXICODONE) 5 MG immediate release tablet Take 1 tablet (5 mg total) by mouth every 6 (six) hours as needed for moderate pain, severe pain or breakthrough pain. (Patient not taking: Reported on 04/21/2018) 8 tablet 0  . prochlorperazine (COMPAZINE) 10 MG tablet Take 1 tablet (10 mg total) by mouth every 6 (six) hours as needed (Nausea or vomiting). (Patient not taking: Reported on 04/21/2018) 30 tablet 1  . valACYclovir (VALTREX) 500 MG tablet Take 500 mg by mouth See admin instructions. Take 500 mg twice daily for 3 days at onset of outbreak then take 500 mg once daily until clear     No current facility-administered medications for this visit.    Facility-Administered Medications Ordered in Other Visits  Medication Dose Route Frequency Provider Last Rate Last Dose  . cyclophosphamide (CYTOXAN) 1,300 mg in sodium chloride 0.9 % 250 mL chemo infusion  600 mg/m2 (Treatment Plan Recorded) Intravenous Once Magrinat, Virgie Dad, MD      . DOXOrubicin (ADRIAMYCIN) chemo injection 130 mg  60 mg/m2 (Treatment Plan Recorded) Intravenous Once Magrinat, Virgie Dad, MD      . fosaprepitant (EMEND) 150 mg, dexamethasone (DECADRON) 12 mg in sodium chloride 0.9 % 145 mL IVPB   Intravenous Once Magrinat, Virgie Dad, MD 454 mL/hr at 04/21/18 1332    . heparin lock flush 100 unit/mL  500 Units Intracatheter Once PRN Magrinat, Virgie Dad, MD      . sodium chloride flush (NS) 0.9 % injection 10 mL  10 mL Intracatheter PRN Magrinat, Virgie Dad, MD        OBJECTIVE:  Vitals:   04/21/18 0943  BP: 124/68  Pulse: 75  Resp: 18  Temp: 97.8 F (36.6 C)  SpO2: 100%     Body mass index is 31.65 kg/m.   Wt Readings from Last 3 Encounters:  04/21/18 214 lb 4.8 oz (97.2 kg)  04/14/18 210 lb (95.3 kg)  04/01/18 214 lb 1.6 oz (97.1 kg)  ECOG  FS:1 GENERAL: Patient is a well appearing female in no acute distress HEENT:  Sclerae anicteric.  Oropharynx clear and moist. No ulcerations or evidence of oropharyngeal candidiasis. Neck is supple. Left supraclavicular nodes noted as well NODES:  No cervical, supraclavicular, or axillary lymphadenopathy palpated.  BREAST EXAM:  Left breast mass noted at 12-1 oclock, left large axillary mass noted LUNGS:  Clear to auscultation bilaterally.  No wheezes or rhonchi. HEART:  Regular rate and rhythm. No murmur appreciated. ABDOMEN:  Soft, nontender.  Positive, normoactive bowel sounds. No organomegaly palpated. MSK:  No focal spinal tenderness to palpation. Full range of motion bilaterally in the upper extremities. EXTREMITIES:  No peripheral edema.   SKIN:  Clear with no obvious rashes or skin changes. No nail dyscrasia. NEURO:  Nonfocal. Well oriented.  Appropriate affect.   LAB RESULTS:  CMP     Component Value Date/Time   NA 138 04/21/2018 1023   K 3.9 04/21/2018 1023   CL 107 04/21/2018 1023   CO2 25 04/21/2018 1023   GLUCOSE 89 04/21/2018 1023   BUN 9 04/21/2018 1023   CREATININE 0.70 04/21/2018 1023   CREATININE 0.67 03/23/2018 0855   CALCIUM 8.7 (L) 04/21/2018 1023   PROT 6.7 04/21/2018 1023   ALBUMIN 3.5 04/21/2018 1023   AST 14 (L) 04/21/2018 1023   AST 15 03/23/2018 0855   ALT 10 04/21/2018 1023   ALT 17 03/23/2018 0855   ALKPHOS 83 04/21/2018 1023   BILITOT 0.3 04/21/2018 1023   BILITOT 1.0 03/23/2018 0855   GFRNONAA >60 04/21/2018 1023   GFRNONAA >60 03/23/2018 0855   GFRAA >60 04/21/2018 1023   GFRAA >60 03/23/2018 0855    No results found for: TOTALPROTELP, ALBUMINELP, A1GS, A2GS, BETS, BETA2SER, GAMS, MSPIKE, SPEI  No results found for: Nils Pyle, [facility]  Lab Results  Component Value Date   WBC 8.4 04/21/2018   NEUTROABS 6.0 04/21/2018   HGB 11.9 (L) 04/21/2018   HCT 37.2 04/21/2018   MCV 94.2 04/21/2018   PLT 150 04/21/2018       Chemistry      Component Value Date/Time   NA 138 04/21/2018 1023   K 3.9 04/21/2018 1023   CL 107 04/21/2018 1023   CO2 25 04/21/2018 1023   BUN 9 04/21/2018 1023   CREATININE 0.70 04/21/2018 1023   CREATININE 0.67 03/23/2018 0855      Component Value Date/Time   CALCIUM 8.7 (L) 04/21/2018 1023   ALKPHOS 83 04/21/2018 1023   AST 14 (L) 04/21/2018 1023   AST 15 03/23/2018 0855   ALT 10 04/21/2018 1023   ALT 17 03/23/2018 0855   BILITOT 0.3 04/21/2018 1023   BILITOT 1.0 03/23/2018 0855       No results found for: LABCA2  No components found for: NUUVOZ366  No results for input(s): INR in the last 168 hours.  No results found for: LABCA2  No results found for: CAN199  No results found for: YQI347  No results found for: QQV956  Lab Results  Component Value Date   CA2729 213.7 (H) 04/07/2018    No components found for: HGQUANT  No results found for: CEA1 / No results found for: CEA1   No results found for: AFPTUMOR  No results found for: [place]  No results found for: PSA1  Appointment on 04/21/2018  Component Date Value Ref Range Status  . Sodium 04/21/2018 138  135 - 145 mmol/L Final  . Potassium 04/21/2018 3.9  3.5 - 5.1 mmol/L Final  . Chloride 04/21/2018 107  98 - 111 mmol/L Final  . CO2 04/21/2018 25  22 - 32 mmol/L Final  . Glucose, Bld 04/21/2018 89  70 - 99 mg/dL Final  . BUN 04/21/2018 9  6 - 20 mg/dL Final  . Creatinine, Ser 04/21/2018 0.70  0.44 - 1.00 mg/dL Final  . Calcium 04/21/2018 8.7* 8.9 - 10.3 mg/dL Final  . Total Protein 04/21/2018 6.7  6.5 - 8.1 g/dL Final  . Albumin 04/21/2018 3.5  3.5 - 5.0 g/dL Final  . AST 04/21/2018 14* 15 - 41 U/L Final  . ALT 04/21/2018 10  0 - 44 U/L Final  . Alkaline Phosphatase 04/21/2018 83  38 - 126 U/L Final  . Total Bilirubin 04/21/2018 0.3  0.3 - 1.2 mg/dL Final  . GFR calc non Af Amer 04/21/2018 >60  >60 mL/min Final  . GFR calc Af Amer 04/21/2018 >60  >60 mL/min Final   Comment: (NOTE) The  eGFR has been calculated using the CKD EPI equation. This calculation has not been validated in all clinical situations. eGFR's persistently <60 mL/min signify possible Chronic Kidney Disease.   Georgiann Hahn gap 04/21/2018 6  5 - 15 Final   Performed at [facility], [place] [address] [place], [place] [address]  . WBC 04/21/2018 8.4  4.0 - 10.5 K/uL Final  . RBC 04/21/2018 3.95  3.87 - 5.11 MIL/uL Final  . Hemoglobin 04/21/2018 11.9* 12.0 - 15.0 g/dL Final  . HCT 04/21/2018 37.2  36.0 - 46.0 % Final  . MCV 04/21/2018 94.2  80.0 - 100.0 fL Final  . MCH 04/21/2018 30.1  26.0 - 34.0 pg Final  . MCHC 04/21/2018 32.0  30.0 - 36.0 g/dL Final  . RDW 04/21/2018 11.5  11.5 - 15.5 % Final  . Platelets 04/21/2018 150  150 - 400 K/uL Final  . nRBC 04/21/2018 0.5* 0.0 - 0.2 % Final  . Neutrophils Relative % 04/21/2018 70  % Final  . Neutro Abs 04/21/2018 6.0  1.7 - 7.7 K/uL Final  . Lymphocytes Relative 04/21/2018 16  % Final  . Lymphs Abs 04/21/2018 1.3  0.7 - 4.0 K/uL Final  . Monocytes Relative 04/21/2018 10  % Final  . Monocytes Absolute 04/21/2018 0.8  0.1 - 1.0 K/uL Final  . Eosinophils Relative 04/21/2018 0  % Final  . Eosinophils Absolute 04/21/2018 0.0  0.0 - 0.5 K/uL Final  . Basophils Relative 04/21/2018 0  % Final  . Basophils Absolute 04/21/2018 0.0  0.0 - 0.1 K/uL Final  . Immature Granulocytes 04/21/2018 4  % Final  . Abs Immature Granulocytes 04/21/2018 0.30* 0.00 - 0.07 K/uL Final   Performed at [facility], [place] [address], [place], [place] [address]    (this displays the last labs from the last 3 days)  No results found for: TOTALPROTELP, ALBUMINELP, A1GS, A2GS, BETS, BETA2SER, GAMS, MSPIKE, SPEI (this displays SPEP labs)  No results found for: KPAFRELGTCHN, LAMBDASER, KAPLAMBRATIO (kappa/lambda light chains)  No results found for: HGBA, HGBA2QUANT, HGBFQUANT, HGBSQUAN (Hemoglobinopathy evaluation)   No results found for:  LDH  No results found for: IRON, TIBC, IRONPCTSAT (Iron and TIBC)  No results found for: FERRITIN  Urinalysis No results found for: COLORURINE, APPEARANCEUR, LABSPEC, PHURINE, GLUCOSEU, HGBUR, BILIRUBINUR, KETONESUR, PROTEINUR, UROBILINOGEN, NITRITE, LEUKOCYTESUR   STUDIES:   ELIGIBLE FOR AVAILABLE RESEARCH PROTOCOL:  May eventually qualify for BR004; UPBEAT  ASSESSMENT: 47 y.o. [place] woman status post left breast overlapping site biopsy and left axillary lymph node biopsy 03/16/2018 for a clinical T2 N3, stage IIIC invasive ductal carcinoma, triple negative, with an MIB-1 of 15%   (1) Staging studies  (a) CT chest on 04/05/2018: left axillary and chronic left  retropectoral adenopathy, supraclavicular adenopathy, left breast skin thickening and mass, 28m left upper lobe pulmonary nodule (repeat ct chest in 3-6 months recommended), bronchiectasis in left lower lobe.  (b) CT neck on 04/05/2018: left supraclavicular adenopathy, ? Abnormality in left frontal lobe of brain  (c) Bone scan on 04/05/2018: no findings suggestive of metastatic disease to bone  (d) MRI brain 04/07/2018: negative  (2) neoadjuvant chemotherapy to consist of doxorubicin and cyclophosphamide in dose dense fashion x4 followed by weekly carboplatin and paclitaxel x12  (3) genetics testing 11/18 pending  (4) definitive surgery to follow  (5) adjuvant radiation  PLAN: MMagdalenis doing well today.  Her labs are not done yet, but so long as they are normal, she will proceed with her second cycle of Doxorubicin and Cyclophosphamide.  I apologized to her about her not being aware of the long time frame between her appointments.  I checked her future appointments and this was not the case.    We reviewed her different scans in detail.  I also reviewed her right breast biopsy results.  We reviewed her supraclavicular lymph node involvement, and the role of radiation after surgery.    MRoxanawill return in 2 days for  her Udenyca.  We will see her back in one week for labs and f/u.  She knows to call for any questions or concerns prior to her next appointment with u[place]    A total of (30) minutes of face-to-face time was spent with this patient with greater than 50% of that time in counseling and care-coordination.  LScot Dock NP   04/21/2018 1:42 PM Medical Oncology and Hematology [facility] LLC5[address] Deerfield StreetALow Moor [place] [address]Tel. [phone]   Fax. [phone]

## 2018-04-21 NOTE — Telephone Encounter (Signed)
No los °

## 2018-04-21 NOTE — Research (Unsigned)
DCP-001 Use of a Clinical Trial Screening Tool to Address Cancer Health Disparities in the [facility]) Patient was screened for the [id number] study but declined participation.  Met with patient tody in infusion room to offer her the opportunity to participate in the DCP-001 data collection study. Informed patient her participation is completelyvoluntary. Patient stated she was willing to learn more about this study. Spent 15 minutes with patient reviewing the consent form for protocol version date 11/11/17 and HIPPA form dated 07/23/14 in their entiretywith patient. Patient is aware that this involves a one time consent and collection of demographic variables with the majority of data collected from hermedical record. No patient identifiers are being reported via the screening tool. Patient verbalized understanding, denied any questions and agreed to participate. She signed and dated the above forms. Research nurse then proceeded to collect study data by asking her demographic information required for the study. Copy of signed consent and HIPPA forms given to patientfor herrecords. Thanked patient for her time today and willingness to participate in this study. Patient meets eligibility and will be enrolled in the DCP-001 study. Consent forms and DCP worksheet given to Remer Macho, research specialist, to complete enrollment. Foye Spurling, BSN, RN Clinical Research Nurse 04/21/2018 3:52 PM

## 2018-04-21 NOTE — Patient Instructions (Signed)
[facility] Discharge Instructions for Patients Receiving Chemotherapy  Today you received the following chemotherapy agents Adriamycin and Cytoxan  To help prevent nausea and vomiting after your treatment, we encourage you to take your nausea medication as directed.  If you develop nausea and vomiting that is not controlled by your nausea medication, call the clinic.   BELOW ARE SYMPTOMS THAT SHOULD BE REPORTED IMMEDIATELY:  *FEVER GREATER THAN 100.5 F  *CHILLS WITH OR WITHOUT FEVER  NAUSEA AND VOMITING THAT IS NOT CONTROLLED WITH YOUR NAUSEA MEDICATION  *UNUSUAL SHORTNESS OF BREATH  *UNUSUAL BRUISING OR BLEEDING  TENDERNESS IN MOUTH AND THROAT WITH OR WITHOUT PRESENCE OF ULCERS  *URINARY PROBLEMS  *BOWEL PROBLEMS  UNUSUAL RASH Items with * indicate a potential emergency and should be followed up as soon as possible.  Feel free to call the clinic should you have any questions or concerns. The clinic phone number is [phone].  Please show the CHEMO ALERT CARD at check-in to the Emergency Department and triage nurse.   

## 2018-04-23 ENCOUNTER — Inpatient Hospital Stay: Payer: BC Managed Care – PPO

## 2018-04-23 VITALS — BP 120/77 | HR 72 | Temp 97.6°F | Resp 18

## 2018-04-23 DIAGNOSIS — C50412 Malignant neoplasm of upper-outer quadrant of left female breast: Secondary | ICD-10-CM

## 2018-04-23 DIAGNOSIS — Z171 Estrogen receptor negative status [ER-]: Principal | ICD-10-CM

## 2018-04-23 MED ORDER — PEGFILGRASTIM-CBQV 6 MG/0.6ML ~~LOC~~ SOSY
PREFILLED_SYRINGE | SUBCUTANEOUS | Status: AC
  Filled 2018-04-23: qty 0.6

## 2018-04-23 MED ORDER — PEGFILGRASTIM-CBQV 6 MG/0.6ML ~~LOC~~ SOSY
6.0000 mg | PREFILLED_SYRINGE | Freq: Once | SUBCUTANEOUS | Status: AC
  Administered 2018-04-23: 6 mg via SUBCUTANEOUS

## 2018-04-23 NOTE — Patient Instructions (Signed)
Pegfilgrastim injection What is this medicine? PEGFILGRASTIM (PEG fil gra stim) is a long-acting granulocyte colony-stimulating factor that stimulates the growth of neutrophils, a type of white blood cell important in the body's fight against infection. It is used to reduce the incidence of fever and infection in patients with certain types of cancer who are receiving chemotherapy that affects the bone marrow, and to increase survival after being exposed to high doses of radiation. This medicine may be used for other purposes; ask your health care provider or pharmacist if you have questions. COMMON BRAND NAME(S): Neulasta What should I tell my health care provider before I take this medicine? They need to know if you have any of these conditions: -kidney disease -latex allergy -ongoing radiation therapy -sickle cell disease -skin reactions to acrylic adhesives (On-Body Injector only) -an unusual or allergic reaction to pegfilgrastim, filgrastim, other medicines, foods, dyes, or preservatives -pregnant or trying to get pregnant -breast-feeding How should I use this medicine? This medicine is for injection under the skin. If you get this medicine at home, you will be taught how to prepare and give the pre-filled syringe or how to use the On-body Injector. Refer to the patient Instructions for Use for detailed instructions. Use exactly as directed. Tell your healthcare provider immediately if you suspect that the On-body Injector may not have performed as intended or if you suspect the use of the On-body Injector resulted in a missed or partial dose. It is important that you put your used needles and syringes in a special sharps container. Do not put them in a trash can. If you do not have a sharps container, call your pharmacist or healthcare provider to get one. Talk to your pediatrician regarding the use of this medicine in children. While this drug may be prescribed for selected conditions,  precautions do apply. Overdosage: If you think you have taken too much of this medicine contact a poison control center or emergency room at once. NOTE: This medicine is only for you. Do not share this medicine with others. What if I miss a dose? It is important not to miss your dose. Call your doctor or health care professional if you miss your dose. If you miss a dose due to an On-body Injector failure or leakage, a new dose should be administered as soon as possible using a single prefilled syringe for manual use. What may interact with this medicine? Interactions have not been studied. Give your health care provider a list of all the medicines, herbs, non-prescription drugs, or dietary supplements you use. Also tell them if you smoke, drink alcohol, or use illegal drugs. Some items may interact with your medicine. This list may not describe all possible interactions. Give your health care provider a list of all the medicines, herbs, non-prescription drugs, or dietary supplements you use. Also tell them if you smoke, drink alcohol, or use illegal drugs. Some items may interact with your medicine. What should I watch for while using this medicine? You may need blood work done while you are taking this medicine. If you are going to need a MRI, CT scan, or other procedure, tell your doctor that you are using this medicine (On-Body Injector only). What side effects may I notice from receiving this medicine? Side effects that you should report to your doctor or health care professional as soon as possible: -allergic reactions like skin rash, itching or hives, swelling of the face, lips, or tongue -dizziness -fever -pain, redness, or irritation at site   where injected -pinpoint red spots on the skin -red or dark-brown urine -shortness of breath or breathing problems -stomach or side pain, or pain at the shoulder -swelling -tiredness -trouble passing urine or change in the amount of urine Side  effects that usually do not require medical attention (report to your doctor or health care professional if they continue or are bothersome): -bone pain -muscle pain This list may not describe all possible side effects. Call your doctor for medical advice about side effects. You may report side effects to FDA at 1-800-FDA-1088. Where should I keep my medicine? Keep out of the reach of children. Store pre-filled syringes in a refrigerator between 2 and 8 degrees C (36 and 46 degrees F). Do not freeze. Keep in carton to protect from light. Throw away this medicine if it is left out of the refrigerator for more than 48 hours. Throw away any unused medicine after the expiration date. NOTE: This sheet is a summary. It may not cover all possible information. If you have questions about this medicine, talk to your doctor, pharmacist, or health care provider.  2018 Elsevier/Gold Standard (2016-05-14 12:58:03)  

## 2018-04-27 ENCOUNTER — Telehealth: Payer: Self-pay | Admitting: Adult Health

## 2018-04-27 ENCOUNTER — Inpatient Hospital Stay: Payer: BC Managed Care – PPO

## 2018-04-27 ENCOUNTER — Inpatient Hospital Stay (HOSPITAL_BASED_OUTPATIENT_CLINIC_OR_DEPARTMENT_OTHER): Payer: BC Managed Care – PPO | Admitting: Adult Health

## 2018-04-27 ENCOUNTER — Encounter: Payer: Self-pay | Admitting: Adult Health

## 2018-04-27 VITALS — BP 116/78 | HR 76 | Temp 98.5°F | Resp 20 | Ht 69.0 in | Wt 211.6 lb

## 2018-04-27 DIAGNOSIS — Z171 Estrogen receptor negative status [ER-]: Secondary | ICD-10-CM

## 2018-04-27 DIAGNOSIS — C773 Secondary and unspecified malignant neoplasm of axilla and upper limb lymph nodes: Secondary | ICD-10-CM

## 2018-04-27 DIAGNOSIS — Z95828 Presence of other vascular implants and grafts: Secondary | ICD-10-CM

## 2018-04-27 DIAGNOSIS — C50412 Malignant neoplasm of upper-outer quadrant of left female breast: Secondary | ICD-10-CM

## 2018-04-27 DIAGNOSIS — A6000 Herpesviral infection of urogenital system, unspecified: Secondary | ICD-10-CM

## 2018-04-27 DIAGNOSIS — R590 Localized enlarged lymph nodes: Secondary | ICD-10-CM | POA: Diagnosis not present

## 2018-04-27 DIAGNOSIS — R918 Other nonspecific abnormal finding of lung field: Secondary | ICD-10-CM

## 2018-04-27 LAB — COMPREHENSIVE METABOLIC PANEL
ALT: 11 U/L (ref 0–44)
AST: 8 U/L — ABNORMAL LOW (ref 15–41)
Albumin: 3.4 g/dL — ABNORMAL LOW (ref 3.5–5.0)
Alkaline Phosphatase: 118 U/L (ref 38–126)
Anion gap: 7 (ref 5–15)
BUN: 8 mg/dL (ref 6–20)
CHLORIDE: 106 mmol/L (ref 98–111)
CO2: 25 mmol/L (ref 22–32)
Calcium: 8.5 mg/dL — ABNORMAL LOW (ref 8.9–10.3)
Creatinine, Ser: 0.57 mg/dL (ref 0.44–1.00)
GFR calc non Af Amer: >60 mL/min (ref >60)
Glucose, Bld: 90 mg/dL (ref 70–99)
POTASSIUM: 4.2 mmol/L (ref 3.5–5.1)
Sodium: 138 mmol/L (ref 135–145)
Total Bilirubin: 1.7 mg/dL — ABNORMAL HIGH (ref 0.3–1.2)
Total Protein: 6.2 g/dL — ABNORMAL LOW (ref 6.5–8.1)

## 2018-04-27 LAB — CBC WITH DIFFERENTIAL/PLATELET
ABS IMMATURE GRANULOCYTES: 0.1 10*3/uL — AB (ref 0.00–0.07)
BAND NEUTROPHILS: 2 %
Basophils Absolute: 0 10*3/uL (ref 0.0–0.1)
Basophils Relative: 0 %
Eosinophils Absolute: 0 10*3/uL (ref 0.0–0.5)
Eosinophils Relative: 0 %
HCT: 35.1 % — ABNORMAL LOW (ref 36.0–46.0)
HEMOGLOBIN: 11.5 g/dL — AB (ref 12.0–15.0)
Lymphocytes Relative: 29 %
Lymphs Abs: 0.8 10*3/uL (ref 0.7–4.0)
MCH: 30.3 pg (ref 26.0–34.0)
MCHC: 32.8 g/dL (ref 30.0–36.0)
MCV: 92.6 fL (ref 80.0–100.0)
METAMYELOCYTES PCT: 2 %
MONO ABS: 0 10*3/uL — AB (ref 0.1–1.0)
MONOS PCT: 0 %
NEUTROS ABS: 1.9 10*3/uL (ref 1.7–17.7)
Neutrophils Relative %: 67 %
PLATELETS: 146 10*3/uL — AB (ref 150–400)
RBC: 3.79 MIL/uL — ABNORMAL LOW (ref 3.87–5.11)
RDW: 11.7 % (ref 11.5–15.5)
WBC: 2.7 10*3/uL — AB (ref 4.0–10.5)
nRBC: 0 % (ref 0.0–0.2)

## 2018-04-27 MED ORDER — SODIUM CHLORIDE 0.9% FLUSH
10.0000 mL | Freq: Once | INTRAVENOUS | Status: AC
  Administered 2018-04-27: 10 mL
  Filled 2018-04-27: qty 10

## 2018-04-27 MED ORDER — HEPARIN SOD (PORK) LOCK FLUSH 100 UNIT/ML IV SOLN
500.0000 [IU] | Freq: Once | INTRAVENOUS | Status: AC
  Administered 2018-04-27: 500 [IU]
  Filled 2018-04-27: qty 5

## 2018-04-27 NOTE — Telephone Encounter (Signed)
No 11/27 los.

## 2018-04-27 NOTE — Patient Instructions (Signed)

## 2018-04-27 NOTE — Progress Notes (Signed)
[facility]  [phone] [phone]     ID: Paula Berry DOB: [birth year]  MR#: [id number]  CVE#:[id number]  Patient Care Team: Vernie Shanks, MD as PCP - General (Family Medicine) Rolm Bookbinder, MD as Consulting Physician (General Surgery) Magrinat, Virgie Dad, MD as Consulting Physician (Oncology) Eppie Gibson, MD as Attending Physician (Radiation Oncology) Everlene Farrier, MD as Consulting Physician (Obstetrics and Gynecology) Scot Dock, NP OTHER MD:  CHIEF COMPLAINT: Triple negative breast cancer  CURRENT TREATMENT: Neoadjuvant chemotherapy   HISTORY OF CURRENT ILLNESS: From the original intake note:  The patient had a left mammogram with tomography and ultrasonography 07/12/2015 at the [facility] after screening recall for a possible left breast mass.  This found the breast density to be category C.  There was a small lobulated mass in the lower outer aspect of the left breast, which was not palpable.  Ultrasound showed a small lobulated cyst in that area measuring 0.6 cm.  This was felt to be benign.  In July 2019 she had screening mammography at Dr. Clementeen Hoof office.  I do not have that report but according to the patient it was unremarkable.  In October 2019 however the patient developed left axillary swelling and tenderness and was again set up for left mammography with tomography and left breast ultrasonography at the [facility], 03/11/2018.  Breast density was category C.  Mammography showed numerous markedly enlarged left axillary lymph nodes.  There was periareolar skin thickening.  There was an area of ill-defined distortion in the upper outer left breast and on physical exam there was a firm lump superior and lateral to the left nipple, with swelling of the left axilla.  Ultrasound of the left breast and both upper quadrants found an ill-defined irregular hypoechoic area measuring at least 4.2 cm.  There also  multiple scattered cysts.  Ultrasound of the left axilla showed at least 6 abnormal lymph nodes.  On 03/16/2018 the patient underwent right mammography with tomography and right breast ultrasonography at the [facility].  Again breast density was category C.  There were multiple circumscribed equal density masses in the upper outer quadrant of the right breast which appeared stable.  Ultrasound showed diffuse fibrocystic changes.  An additional hypoechoic mass was noted at the 9 o'clock position 3 cm from the nipple measuring 0.8 cm, with no associated vascularity.  A complex solid and cystic mass was noted superficially at the 6 o'clock position 3 cm from the nipple measuring 1.6 cm.  Evaluation of the right axilla was negative.  On 03/18/2018 the patient underwent biopsy of the 2 suspicious areas in the right breast, and they both showed only fibrocystic changes with some chronic inflammation ([id number]).  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  The patient's subsequent history is as detailed below.  INTERVAL HISTORY: Paula Berry returns to the breast cancer clinic on 04/27/2018 accompanied by her. She is here for evaluation after receiving her second of four cycles of neoadjuvant chemotherapy with Doxorubicin and Cyclophosphamide given dose dense on day 1 of a 14 day cycle with Udenyca support given on day 3.  REVIEW OF SYSTEMS: Paula Berry is doing moderately well.  She does have a headache today.  She has h/o migraines, that date back to the 90s.  She was on several medications, and took herself off.  She typically takes Excedrine Migraine.  She hasn't taken the medication because it has aspirin in it.  She says her headache was triggered by a fire alarm at work two days ago,  and it has been present since.  She feels her headaches on the left side and behind the left eye.  No new associated symptoms with these headaches, they remain at her baseline as her normal migraines.    Paula Berry is fatigued.  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  She had a challenging time getting through the work day on Monday and Tuesday.  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    Paula Berry has not had any nausea or vomiting.  She is taking her anti emetics as prescribed, but only takes what she needs.  She is having normal bowel movements.  Paula Berry has h/o genital herpes, and had a strange sensation this morning. She has valtrex ordered and says she should take it daily but doesn't always.    Paula Berry notes that her breast isn't as heavy.  She denies any fevers, chills, nausea, vomiting, constipation, diarrhea, or any other concerns.  A detailed ROS was conducted and was otherwise non contributory.     PAST MEDICAL HISTORY: Past Medical History:  Diagnosis Date  . Anemia    had iron infusion 08/12/15  . Asthma   . Cancer [facility])    Breast cancer   . Genital herpes   . GERD (gastroesophageal reflux disease)   . Headache    migraines 2-3 times monthly  . Pneumonia    hospitalizied for pneumonia as a child    PAST SURGICAL HISTORY: Past Surgical History:  Procedure Laterality Date  . ABDOMINAL HYSTERECTOMY Bilateral 08/22/2015   Procedure: HYSTERECTOMY ABDOMINAL, BILATERAL SALPINGECTOMY;  Surgeon: Everlene Farrier, MD;  Location: [facility];  Service: Gynecology;  Laterality: Bilateral;  . MYOMECTOMY ABDOMINAL APPROACH    . PORTACATH PLACEMENT N/A 03/30/2018   Procedure: INSERTION PORT-A-CATH WITH [place];  Surgeon: Rolm Bookbinder, MD;  Location: [facility];  Service: General;  Laterality: N/A;    FAMILY HISTORY Family History  Problem Relation Age of  Onset  . Hypertension Unknown   . Diabetes Cousin        mat first cousin  . Cancer Unknown   . Dementia Maternal Grandfather   . Lung cancer Paternal Grandmother        d. 51-60  . Brain cancer Paternal Aunt        d. 68, father's mat 1/2 sister  . Diabetes Maternal Grandmother   . Other Paternal Grandfather        d. WWII  . Aneurysm Maternal Uncle        brain  . Migraines Neg Hx   The patient's parents are in their early 31s as of October 2019.  A paternal aunt had brain cancer at age 39.  A paternal grandmother had lung cancer at age 33.  There is no breast or ovarian cancer history in the family.  However note that the patient has little information on her mother side of the family.  GYNECOLOGIC HISTORY:  No LMP recorded. Patient has had a hysterectomy. 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  Her husband [name]. works for the [employer].  He is a former patient here, with stage IV colon cancer.  They live alone, with no pets.     ADVANCED DIRECTIVES: Not in place   HEALTH MAINTENANCE: Social History   Tobacco Use  . Smoking status: Never Smoker  . Smokeless tobacco: Never Used  Substance Use Topics  . Alcohol use: Yes    Alcohol/week: 1.0 standard drinks    Types: 1 Glasses of wine per week    Comment: daily  . Drug use: No     Colonoscopy: Never  PAP: Status post hysterectomy  Bone density: Never   Allergies  Allergen Reactions  . Neosporin [Neomycin-Bacitracin Zn-Polymyx] Other (See Comments)    Decreased wound healing with blisters    Current Outpatient Medications  Medication Sig Dispense Refill  . acetaminophen (TYLENOL) 500 MG tablet Take 1,000 mg by mouth every 8 (eight)  hours as needed for moderate pain.    Marland Kitchen acyclovir ointment (ZOVIRAX) 5 % Apply 1 application topically 4 (four) times daily as needed (outbreaks).   2  . albuterol (PROVENTIL HFA;VENTOLIN HFA) 108 (90 Base) MCG/ACT inhaler Inhale 2 puffs into the lungs every 6 (six) hours as needed for wheezing or shortness of breath.     . ciprofloxacin (CIPRO) 500 MG tablet Take 1 tablet (500 mg total) by mouth 2 (two) times daily. 14 tablet 0  . dexamethasone (DECADRON) 4 MG tablet Take 2 tablets by mouth once a day on the day after chemotherapy and then take 2 tablets two times a day for 2 days. Take with food. 30 tablet 1  . lidocaine-prilocaine (EMLA) cream Apply to affected area once 30 g 3  . LORazepam (ATIVAN) 0.5 MG tablet Take 1 tablet (0.5 mg total) by mouth at bedtime as needed (Nausea or vomiting). 30 tablet 0  . methocarbamol (ROBAXIN) 500 MG tablet Take 500 mg by mouth 3 (three) times daily as needed for muscle spasms (pain).    . montelukast (SINGULAIR) 10 MG tablet Take 10 mg by mouth every evening.    Marland Kitchen oxyCODONE (OXY IR/ROXICODONE) 5 MG immediate release tablet Take 1 tablet (5 mg total) by mouth every 6 (six) hours as needed for moderate pain, severe pain or breakthrough pain. 8 tablet 0  . pantoprazole (PROTONIX) 40 MG tablet Take 40 mg by mouth every evening.    . prochlorperazine (COMPAZINE) 10 MG tablet Take 1 tablet (10 mg total) by mouth every 6 (six) hours as needed (Nausea or vomiting). 30 tablet 1  . SYMBICORT 160-4.5 MCG/ACT inhaler INL 2 PFS PO BID  3  . valACYclovir (VALTREX) 500 MG tablet Take 500 mg by mouth See admin instructions. Take 500 mg twice daily for 3 days at onset of outbreak then take 500 mg once daily until clear     No current facility-administered medications for this visit.     OBJECTIVE:  Vitals:   04/27/18 0817  BP: 116/78  Pulse: 76  Resp: 20  Temp: 98.5 F (36.9 C)  SpO2: 100%     Body mass index is 31.25 kg/m.   Wt Readings from Last 3 Encounters:    04/27/18 211 lb 9.6 oz (96 kg)  04/21/18 214 lb 4.8 oz (97.2 kg)  04/14/18 210 lb (95.3 kg)  ECOG FS:1 GENERAL: Patient is  a well appearing female in no acute distress HEENT:  Sclerae anicteric.  Oropharynx clear and moist. No ulcerations or evidence of oropharyngeal candidiasis. Neck is supple. Left supraclavicular nodes noted as well NODES:  No cervical, supraclavicular, or axillary lymphadenopathy palpated.  BREAST EXAM:  Left breast mass noted at 12-1 oclock, left large axillary mass noted LUNGS:  Clear to auscultation bilaterally.  No wheezes or rhonchi. HEART:  Regular rate and rhythm. No murmur appreciated. ABDOMEN:  Soft, nontender.  Positive, normoactive bowel sounds. No organomegaly palpated. MSK:  No focal spinal tenderness to palpation. Full range of motion bilaterally in the upper extremities. EXTREMITIES:  No peripheral edema.   SKIN:  Clear with no obvious rashes or skin changes. No nail dyscrasia. NEURO:  Nonfocal. Well oriented.  Appropriate affect.   LAB RESULTS:  CMP     Component Value Date/Time   NA 138 04/21/2018 1023   K 3.9 04/21/2018 1023   CL 107 04/21/2018 1023   CO2 25 04/21/2018 1023   GLUCOSE 89 04/21/2018 1023   BUN 9 04/21/2018 1023   CREATININE 0.70 04/21/2018 1023   CREATININE 0.67 03/23/2018 0855   CALCIUM 8.7 (L) 04/21/2018 1023   PROT 6.7 04/21/2018 1023   ALBUMIN 3.5 04/21/2018 1023   AST 14 (L) 04/21/2018 1023   AST 15 03/23/2018 0855   ALT 10 04/21/2018 1023   ALT 17 03/23/2018 0855   ALKPHOS 83 04/21/2018 1023   BILITOT 0.3 04/21/2018 1023   BILITOT 1.0 03/23/2018 0855   GFRNONAA >60 04/21/2018 1023   GFRNONAA >60 03/23/2018 0855   GFRAA >60 04/21/2018 1023   GFRAA >60 03/23/2018 0855    No results found for: TOTALPROTELP, ALBUMINELP, A1GS, A2GS, BETS, BETA2SER, GAMS, MSPIKE, SPEI  No results found for: Nils Pyle, [facility]  Lab Results  Component Value Date   WBC 2.7 (L) 04/27/2018   NEUTROABS PENDING  04/27/2018   HGB 11.5 (L) 04/27/2018   HCT 35.1 (L) 04/27/2018   MCV 92.6 04/27/2018   PLT 146 (L) 04/27/2018      Chemistry      Component Value Date/Time   NA 138 04/21/2018 1023   K 3.9 04/21/2018 1023   CL 107 04/21/2018 1023   CO2 25 04/21/2018 1023   BUN 9 04/21/2018 1023   CREATININE 0.70 04/21/2018 1023   CREATININE 0.67 03/23/2018 0855      Component Value Date/Time   CALCIUM 8.7 (L) 04/21/2018 1023   ALKPHOS 83 04/21/2018 1023   AST 14 (L) 04/21/2018 1023   AST 15 03/23/2018 0855   ALT 10 04/21/2018 1023   ALT 17 03/23/2018 0855   BILITOT 0.3 04/21/2018 1023   BILITOT 1.0 03/23/2018 0855       No results found for: LABCA2  No components found for: [id number]  No results for input(s): INR in the last 168 hours.  No results found for: LABCA2  No results found for: CAN199  No results found for: IOE703  No results found for: JKK938  Lab Results  Component Value Date   CA2729 213.7 (H) 04/07/2018    No components found for: HGQUANT  No results found for: CEA1 / No results found for: CEA1   No results found for: AFPTUMOR  No results found for: [place]  No results found for: PSA1  Appointment on 04/27/2018  Component Date Value Ref Range Status  . WBC 04/27/2018 2.7* 4.0 - 10.5 K/uL Final  . RBC 04/27/2018 3.79* 3.87 - 5.11 MIL/uL Final  . Hemoglobin  04/27/2018 11.5* 12.0 - 15.0 g/dL Final  . HCT 04/27/2018 35.1* 36.0 - 46.0 % Final  . MCV 04/27/2018 92.6  80.0 - 100.0 fL Final  . MCH 04/27/2018 30.3  26.0 - 34.0 pg Final  . MCHC 04/27/2018 32.8  30.0 - 36.0 g/dL Final  . RDW 04/27/2018 11.7  11.5 - 15.5 % Final  . Platelets 04/27/2018 146* 150 - 400 K/uL Final  . nRBC 04/27/2018 0.0  0.0 - 0.2 % Final   Performed at [facility], [place] [address], [place], [place] [address]  . Neutrophils Relative % 04/27/2018 PENDING  % Incomplete  . Neutro Abs 04/27/2018 PENDING  1.7 - 7.7 K/uL Incomplete  . Band Neutrophils  04/27/2018 PENDING  % Incomplete  . Lymphocytes Relative 04/27/2018 PENDING  % Incomplete  . Lymphs Abs 04/27/2018 PENDING  0.7 - 4.0 K/uL Incomplete  . Monocytes Relative 04/27/2018 PENDING  % Incomplete  . Monocytes Absolute 04/27/2018 PENDING  0.1 - 1.0 K/uL Incomplete  . Eosinophils Relative 04/27/2018 PENDING  % Incomplete  . Eosinophils Absolute 04/27/2018 PENDING  0.0 - 0.5 K/uL Incomplete  . Basophils Relative 04/27/2018 PENDING  % Incomplete  . Basophils Absolute 04/27/2018 PENDING  0.0 - 0.1 K/uL Incomplete  . WBC Morphology 04/27/2018 PENDING   Incomplete  . RBC Morphology 04/27/2018 PENDING   Incomplete  . Smear Review 04/27/2018 PENDING   Incomplete  . Other 04/27/2018 PENDING  % Incomplete  . nRBC 04/27/2018 PENDING  0 /100 WBC Incomplete  . Metamyelocytes Relative 04/27/2018 PENDING  % Incomplete  . Myelocytes 04/27/2018 PENDING  % Incomplete  . Promyelocytes Relative 04/27/2018 PENDING  % Incomplete  . Blasts 04/27/2018 PENDING  % Incomplete    (this displays the last labs from the last 3 days)  No results found for: TOTALPROTELP, ALBUMINELP, A1GS, A2GS, BETS, BETA2SER, GAMS, MSPIKE, SPEI (this displays SPEP labs)  No results found for: KPAFRELGTCHN, LAMBDASER, KAPLAMBRATIO (kappa/lambda light chains)  No results found for: HGBA, HGBA2QUANT, HGBFQUANT, HGBSQUAN (Hemoglobinopathy evaluation)   No results found for: LDH  No results found for: IRON, TIBC, IRONPCTSAT (Iron and TIBC)  No results found for: FERRITIN  Urinalysis No results found for: COLORURINE, APPEARANCEUR, LABSPEC, PHURINE, GLUCOSEU, HGBUR, BILIRUBINUR, KETONESUR, PROTEINUR, UROBILINOGEN, NITRITE, LEUKOCYTESUR   STUDIES:   ELIGIBLE FOR AVAILABLE RESEARCH PROTOCOL:  May eventually qualify for BR004; UPBEAT  ASSESSMENT: 47 y.o. [place] woman status post left breast overlapping site biopsy and left axillary lymph node biopsy 03/16/2018 for a clinical T2 N3, stage IIIC invasive ductal  carcinoma, triple negative, with an MIB-1 of 15%   (1) Staging studies  (a) CT chest on 04/05/2018: left axillary and chronic left retropectoral adenopathy, supraclavicular adenopathy, left breast skin thickening and mass, 48m left upper lobe pulmonary nodule (repeat ct chest in 3-6 months recommended), bronchiectasis in left lower lobe.  (b) CT neck on 04/05/2018: left supraclavicular adenopathy, ? Abnormality in left frontal lobe of brain  (c) Bone scan on 04/05/2018: no findings suggestive of metastatic disease to bone  (d) MRI brain 04/07/2018: negative  (2) neoadjuvant chemotherapy to consist of doxorubicin and cyclophosphamide in dose dense fashion x4 followed by weekly carboplatin and paclitaxel x12  (3) genetics testing 11/18 pending  (4) definitive surgery to follow  (5) adjuvant radiation  PLAN: MOrianis doing moderately well today.  She tolerated her second cycle of neoadjuvant chemotherapy relatively well.  She is not neutropenic today.  We discussed her fatigue and how to manage it.  Paula Berry  and I reviewed her headaches.  I let her know that taking Excedrine Migraine if absolutely needed is ok.  Should her headache continue, I suggested she call us, as symptom management could see her on Friday, and she could come in for a Toradol injection.  She understands this.  To note, she has undergone MRI brain earlier this month that was negative for metastases.  In regards to her h/o genital herpes.  Since she had a "sensation" that an outbreak may be coming on, I recommended that she restart her valtrex and continue on it regularly throughout her chemotherapy.    Paula Berry will return next week for cycle 3 of her treatment.  She knows to call for any questions or concerns prior to her next appointment with [place].    A total of (30) minutes of face-to-face time was spent with this patient with greater than 50% of that time in counseling and care-coordination.  Scot Dock, NP    04/27/2018 9:07 AM Medical Oncology and Hematology [facility] [address] [place], [place] [address] Tel. [phone]    Fax. [phone]

## 2018-05-03 ENCOUNTER — Encounter: Payer: Self-pay | Admitting: Genetic Counselor

## 2018-05-03 ENCOUNTER — Telehealth: Payer: Self-pay | Admitting: Genetic Counselor

## 2018-05-03 ENCOUNTER — Ambulatory Visit: Payer: Self-pay | Admitting: Genetic Counselor

## 2018-05-03 DIAGNOSIS — Z1379 Encounter for other screening for genetic and chromosomal anomalies: Secondary | ICD-10-CM

## 2018-05-03 NOTE — Telephone Encounter (Signed)
LM on VM that results were back and to please CB. Left CB instructions.

## 2018-05-03 NOTE — Progress Notes (Signed)
HPI:  [name] was previously seen in the [facility] due to a personal and family history of cancer and concerns regarding a hereditary predisposition to cancer. Please refer to our prior cancer genetics clinic note for more information regarding [name], social and family histories, and our assessment and recommendations, at the time. [name] recent genetic test results were disclosed to her, as were recommendations warranted by these results. These results and recommendations are discussed in more detail below.  CANCER HISTORY:    Malignant neoplasm of upper-outer quadrant of left breast in female, estrogen receptor negative ([place])   03/21/2018 Initial Diagnosis    Malignant neoplasm of upper-outer quadrant of left breast in female, estrogen receptor negative ([place])    04/06/2018 -  Chemotherapy    The patient had DOXOrubicin (ADRIAMYCIN) chemo injection 130 mg, 60 mg/m2 = 130 mg, Intravenous,  Once, 2 of 4 cycles Administration: 130 mg (04/07/2018), 130 mg (04/21/2018) palonosetron (ALOXI) injection 0.25 mg, 0.25 mg, Intravenous,  Once, 2 of 8 cycles Administration: 0.25 mg (04/07/2018), 0.25 mg (04/21/2018) pegfilgrastim-cbqv (UDENYCA) injection 6 mg, 6 mg, Subcutaneous, Once, 2 of 4 cycles Administration: 6 mg (04/09/2018) CARBOplatin (PARAPLATIN) in sodium chloride 0.9 % 100 mL chemo infusion, , Intravenous,  Once, 0 of 4 cycles cyclophosphamide (CYTOXAN) 1,300 mg in sodium chloride 0.9 % 250 mL chemo infusion, 600 mg/m2 = 1,300 mg, Intravenous,  Once, 2 of 4 cycles Administration: 1,300 mg (04/07/2018), 1,300 mg (04/21/2018) PACLitaxel (TAXOL) 174 mg in sodium chloride 0.9 % 250 mL chemo infusion (</= 59m/m2), 80 mg/m2, Intravenous,  Once, 0 of 4 cycles fosaprepitant (EMEND) 150 mg, dexamethasone (DECADRON) 12 mg in sodium chloride 0.9 % 145 mL IVPB, , Intravenous,  Once, 2 of 8 cycles Administration:  (04/07/2018),  (04/21/2018)  for  chemotherapy treatment.     04/27/2018 Genetic Testing    Negative genetic testing on the multi-cancer panel.  The Multi-Gene Panel offered by [facility] includes sequencing and/or deletion duplication testing of the following 84 genes: AIP, ALK, APC, ATM, AXIN2,BAP1,  BARD1, BLM, BMPR1A, BRCA1, BRCA2, BRIP1, CASR, CDC73, CDH1, CDK4, CDKN1B, CDKN1C, CDKN2A (p14ARF), CDKN2A (p16INK4a), CEBPA, CHEK2, CTNNA1, DICER1, DIS3L2, EGFR (c.2369C>T, p.Thr790Met variant only), EPCAM (Deletion/duplication testing only), FH, FLCN, GATA2, GPC3, GREM1 (Promoter region deletion/duplication testing only), HOXB13 (c.251G>A, p.Gly84Glu), HRAS, KIT, MAX, MEN1, MET, MITF (c.952G>A, p.Glu318Lys variant only), MLH1, MSH2, MSH3, MSH6, MUTYH, NBN, NF1, NF2, NTHL1, PALB2, PDGFRA, PHOX2B, PMS2, POLD1, POLE, POT1, PRKAR1A, PTCH1, PTEN, RAD50, RAD51C, RAD51D, RB1, RECQL4, RET, RUNX1, SDHAF2, SDHA (sequence changes only), SDHB, SDHC, SDHD, SMAD4, SMARCA4, SMARCB1, SMARCE1, STK11, SUFU, TERC, TERT, TMEM127, TP53, TSC1, TSC2, VHL, WRN and WT1.  The report date is April 27, 2018.     FAMILY HISTORY:  We obtained a detailed, 4-generation family history.  Significant diagnoses are listed below: Family History  Problem Relation Age of Onset  . Hypertension Unknown   . Diabetes Cousin        mat first cousin  . Cancer Unknown   . Dementia Maternal Grandfather   . Lung cancer Paternal Grandmother        d. 542-60 . Brain cancer Paternal Aunt        d. 413 father's mat 1/2 sister  . Diabetes Maternal Grandmother   . Other Paternal Grandfather        d. WWII  . Aneurysm Maternal Uncle        brain  . Migraines Neg Hx  The patient does not have children.  She has one full sister and a paternal half sister who are cancer free.  Her parents are both living.  The patient's father has a full brother and sister, and two maternal half sisters and half brother.  His full sister died of an acute illness in her teens.  One half  sister died around 79 from a brain tumor.  The paternal grandparents are deceased.  The grandfather died in WWII and the grandmother died of lung cancer at age 8-60.  The patient's mother is living.  She had two brothers and a sister who are all cancer free.  The maternal grandparents are deceased, the grandmother from diabetes complications and the grandfather from dementia.  [name] is unaware of previous family history of genetic testing for hereditary cancer risks. Patient's maternal ancestors are of African American descent, and paternal ancestors are of African American descent. There is no reported Ashkenazi Jewish ancestry. There is no known consanguinity.  GENETIC TEST RESULTS: Genetic testing reported out on April 27, 2018 through the multi-cancer panel found no deleterious mutations.  The Multi-Gene Panel offered by [facility] includes sequencing and/or deletion duplication testing of the following 84 genes: AIP, ALK, APC, ATM, AXIN2,BAP1,  BARD1, BLM, BMPR1A, BRCA1, BRCA2, BRIP1, CASR, CDC73, CDH1, CDK4, CDKN1B, CDKN1C, CDKN2A (p14ARF), CDKN2A (p16INK4a), CEBPA, CHEK2, CTNNA1, DICER1, DIS3L2, EGFR (c.2369C>T, p.Thr790Met variant only), EPCAM (Deletion/duplication testing only), FH, FLCN, GATA2, GPC3, GREM1 (Promoter region deletion/duplication testing only), HOXB13 (c.251G>A, p.Gly84Glu), HRAS, KIT, MAX, MEN1, MET, MITF (c.952G>A, p.Glu318Lys variant only), MLH1, MSH2, MSH3, MSH6, MUTYH, NBN, NF1, NF2, NTHL1, PALB2, PDGFRA, PHOX2B, PMS2, POLD1, POLE, POT1, PRKAR1A, PTCH1, PTEN, RAD50, RAD51C, RAD51D, RB1, RECQL4, RET, RUNX1, SDHAF2, SDHA (sequence changes only), SDHB, SDHC, SDHD, SMAD4, SMARCA4, SMARCB1, SMARCE1, STK11, SUFU, TERC, TERT, TMEM127, TP53, TSC1, TSC2, VHL, WRN and WT1.   The test report has been scanned into EPIC and is located under the Molecular Pathology section of the Results Review tab.    We discussed with [name] that since the current genetic testing is  not perfect, it is possible there may be a gene mutation in one of these genes that current testing cannot detect, but that chance is small.  We also discussed, that it is possible that another gene that has not yet been discovered, or that we have not yet tested, is responsible for the cancer diagnoses in the family, and it is, therefore, important to remain in touch with cancer genetics in the future so that we can continue to offer [name] the most up to date genetic testing.    CANCER SCREENING RECOMMENDATIONS: This result is reassuring and indicates that [name] likely does not have an increased risk for a future cancer due to a mutation in one of these genes. This normal test also suggests that [name]'s cancer was most likely not due to an inherited predisposition associated with one of these genes.  Most cancers happen by chance and this negative test suggests that her cancer falls into this category.  We, therefore, recommended she continue to follow the cancer management and screening guidelines provided by her oncology and primary healthcare provider.   An individual's cancer risk and medical management are not determined by genetic test results alone. Overall cancer risk assessment incorporates additional factors, including personal medical history, family history, and any available genetic information that may result in a personalized plan for cancer prevention and surveillance.  RECOMMENDATIONS FOR  FAMILY MEMBERS:  Women in this family might be at some increased risk of developing cancer, over the general population risk, simply due to the family history of cancer.  We recommended women in this family have a yearly mammogram beginning at age 47, or 72 years younger than the earliest onset of cancer, an annual clinical breast exam, and perform monthly breast self-exams. Women in this family should also have a gynecological exam as recommended by their primary provider.  All family members should have a colonoscopy by age 81.  FOLLOW-UP: Lastly, we discussed with [name] that cancer genetics is a rapidly advancing field and it is possible that new genetic tests will be appropriate for her and/or her family members in the future. We encouraged her to remain in contact with cancer genetics on an annual basis so we can update her personal and family histories and let her know of advances in cancer genetics that may benefit this family.   Our contact number was provided. [name] questions were answered to her satisfaction, and she knows she is welcome to call us at anytime with additional questions or concerns.   Roma Kayser, MS, [facility] Certified Genetic Counselor [email or web address]

## 2018-05-03 NOTE — Telephone Encounter (Signed)
Revealed negative genetic testing.  Discussed that we do not know why she has breast cancer or why there is cancer in the family. It could be due to a different gene that we are not testing, or maybe our current technology may not be able to pick something up.  It will be important for her to keep in contact with genetics to keep up with whether additional testing may be needed. 

## 2018-05-04 NOTE — Progress Notes (Signed)
[place]  [phone] [phone]     ID: Paula Berry DOB: 06-Apr-1971  MR#: [id number]  XTK#:[id number]  Patient Care Team: Vernie Shanks, MD as PCP - General (Family Medicine) Rolm Bookbinder, MD as Consulting Physician (General Surgery) Lashona Schaaf, Virgie Dad, MD as Consulting Physician (Oncology) Eppie Gibson, MD as Attending Physician (Radiation Oncology) Everlene Farrier, MD as Consulting Physician (Obstetrics and Gynecology) Chauncey Cruel, MD OTHER MD:  CHIEF COMPLAINT: Triple negative breast cancer  CURRENT TREATMENT: Neoadjuvant chemotherapy   HISTORY OF CURRENT ILLNESS: From the original intake note:  The patient had a left mammogram with tomography and ultrasonography 07/12/2015 at the [facility] after screening recall for a possible left breast mass.  This found the breast density to be category C.  There was a small lobulated mass in the lower outer aspect of the left breast, which was not palpable.  Ultrasound showed a small lobulated cyst in that area measuring 0.6 cm.  This was felt to be benign.  In July 2019 Paula Berry had screening mammography at Dr. Clementeen Hoof office.  I do not have that report but according to the patient it was unremarkable.  In October 2019 however the patient developed left axillary swelling and tenderness and was again set up for left mammography with tomography and left breast ultrasonography at the [facility], 03/11/2018.  Breast density was category C.  Mammography showed numerous markedly enlarged left axillary lymph nodes.  There was periareolar skin thickening.  There was an area of ill-defined distortion in the upper outer left breast and on physical exam there was a firm lump superior and lateral to the left nipple, with swelling of the left axilla.  Ultrasound of the left breast and both upper quadrants found an ill-defined irregular hypoechoic area measuring at least 4.2 cm.  There  also multiple scattered cysts.  Ultrasound of the left axilla showed at least 6 abnormal lymph nodes.  On 03/16/2018 the patient underwent right mammography with tomography and right breast ultrasonography at the [facility].  Again breast density was category C.  There were multiple circumscribed equal density masses in the upper outer quadrant of the right breast which appeared stable.  Ultrasound showed diffuse fibrocystic changes.  An additional hypoechoic mass was noted at the 9 o'clock position 3 cm from the nipple measuring 0.8 cm, with no associated vascularity.  A complex solid and cystic mass was noted superficially at the 6 o'clock position 3 cm from the nipple measuring 1.6 cm.  Evaluation of the right axilla was negative.  On 03/18/2018 the patient underwent biopsy of the 2 suspicious areas in the right breast, and they both showed only fibrocystic changes with some chronic inflammation ([id number]).  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  The patient's subsequent history is as detailed below.  INTERVAL HISTORY: Paula Berry returns today for follow-up of her triple negative breast cancer.   The patient continues on neoadjuvant chemotherapy with Doxorubicin and Cyclophosphamide Paula Berry is on day 1 cycle 3 of 4 planned cycles.  Paula Berry is tolerating her treatment well.   Since her last visit here, Paula Berry underwent Genetics Testing on 04/27/2018, which showed no deleterious mutations. The Multi-Gene Panel offered by [facility] includes sequencing and/or deletion duplication testing of the following 84 genes: AIP,  ALK, APC, ATM, AXIN2,BAP1,  BARD1, BLM, BMPR1A, BRCA1, BRCA2, BRIP1, CASR, CDC73, CDH1, CDK4, CDKN1B, CDKN1C, CDKN2A (p14ARF), CDKN2A (p16INK4a), CEBPA, CHEK2, CTNNA1,  DICER1, DIS3L2, EGFR (c.2369C>T, p.Thr790Met variant only), EPCAM (Deletion/duplication testing only), FH, FLCN, GATA2, GPC3, GREM1 (Promoter region deletion/duplication testing only), HOXB13 (c.251G>A, p.Gly84Glu), HRAS, KIT, MAX, MEN1, MET, MITF (c.952G>A, p.Glu318Lys variant only), MLH1, MSH2, MSH3, MSH6, MUTYH, NBN, NF1, NF2, NTHL1, PALB2, PDGFRA, PHOX2B, PMS2, POLD1, POLE, POT1, PRKAR1A, PTCH1, PTEN, RAD50, RAD51C, RAD51D, RB1, RECQL4, RET, RUNX1, SDHAF2, SDHA (sequence changes only), SDHB, SDHC, SDHD, SMAD4, SMARCA4, SMARCB1, SMARCE1, STK11, SUFU, TERC, TERT, TMEM127, TP53, TSC1, TSC2, VHL, WRN and WT1.    REVIEW OF SYSTEMS: Paula Berry is doing well overall. Paula Berry has been trying to relax and has been taking it easy. Paula Berry has been trying to process her diagnosis, and with the addition of her genetic testing, Paula Berry was relieved but surprised. Acyclovir ointment for the first time for a rash. This particular occurrence scared her, since the reaction was atypical. The onset was very rapid. Paula Berry does experience watering of the eyes in the morning, and yesterday (05/05/2018), Paula Berry developed a sore throat. Paula Berry is treating her symptoms with claritin. For exercise, se is walking.  The patient denies unusual headaches, visual changes, nausea, vomiting, or dizziness. There has been no unusual cough, phlegm production, or pleurisy. This been no change in bowel or bladder habits. The patient denies unexplained fatigue or unexplained weight loss, bleeding, rash, or fever. A detailed review of systems was otherwise noncontributory.      For exersie, Paula Berry is walking everyday. Paula Berry does situps. Paula Berry loves Zumba, and is mindful that Paula Berry needs to attend more. Hx - full hysterectomy - not ovaries. Paula Berry still gets cramps.      PAST MEDICAL HISTORY: Past Medical History:  Diagnosis Date  . Anemia    had iron infusion 08/12/15  . Asthma   . Cancer [facility])    Breast cancer   . Genital herpes   . GERD (gastroesophageal reflux  disease)   . Headache    migraines 2-3 times monthly  . Pneumonia    hospitalizied for pneumonia as a child    PAST SURGICAL HISTORY: Past Surgical History:  Procedure Laterality Date  . ABDOMINAL HYSTERECTOMY Bilateral 08/22/2015   Procedure: HYSTERECTOMY ABDOMINAL, BILATERAL SALPINGECTOMY;  Surgeon: Everlene Farrier, MD;  Location: [facility];  Service: Gynecology;  Laterality: Bilateral;  . MYOMECTOMY ABDOMINAL APPROACH    . PORTACATH PLACEMENT N/A 03/30/2018   Procedure: INSERTION PORT-A-CATH WITH [place];  Surgeon: Rolm Bookbinder, MD;  Location: [facility];  Service: General;  Laterality: N/A;    FAMILY HISTORY Family History  Problem Relation Age of Onset  . Hypertension Unknown   . Diabetes Cousin        mat first cousin  . Cancer Unknown   . Dementia Maternal Grandfather   . Lung cancer Paternal Grandmother        d. 26-60  . Brain cancer Paternal Aunt        d. 73, father's mat 1/2 sister  . Diabetes Maternal Grandmother   . Other Paternal Grandfather        d. WWII  . Aneurysm Maternal Uncle        brain  . Migraines Neg Hx   The patient's parents are in their early 78s as of October 2019.  A paternal aunt had brain cancer at age 34.  A paternal grandmother had lung cancer at age 84.  There is no breast or  ovarian cancer history in the family.  However note that the patient has little information on her mother side of the family.  GYNECOLOGIC HISTORY:  No LMP recorded. Patient has had a hysterectomy. 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  Her husband Paula Berry. works for the [employer].  He is a former patient here, with stage IV colon cancer.  They live alone, with no pets.     ADVANCED DIRECTIVES:  Not in place   HEALTH MAINTENANCE: Social History   Tobacco Use  . Smoking status: Never Smoker  . Smokeless tobacco: Never Used  Substance Use Topics  . Alcohol use: Yes    Alcohol/week: 1.0 standard drinks    Types: 1 Glasses of wine per week    Comment: daily  . Drug use: No     Colonoscopy: Never  PAP: Status post hysterectomy  Bone density: Never   Allergies  Allergen Reactions  . Neosporin [Neomycin-Bacitracin Zn-Polymyx] Other (See Comments)    Decreased wound healing with blisters    Current Outpatient Medications  Medication Sig Dispense Refill  . acetaminophen (TYLENOL) 500 MG tablet Take 1,000 mg by mouth every 8 (eight) hours as needed for moderate pain.    Marland Kitchen acyclovir ointment (ZOVIRAX) 5 % Apply 1 application topically 4 (four) times daily as needed (outbreaks).   2  . albuterol (PROVENTIL HFA;VENTOLIN HFA) 108 (90 Base) MCG/ACT inhaler Inhale 2 puffs into the lungs every 6 (six) hours as needed for wheezing or shortness of breath.     . ciprofloxacin (CIPRO) 500 MG tablet Take 1 tablet (500 mg total) by mouth 2 (two) times daily. 14 tablet 0  . dexamethasone (DECADRON) 4 MG tablet Take 2 tablets by mouth once a day on the day after chemotherapy and then take 2 tablets two times a day for 2 days. Take with food. 30 tablet 1  . lidocaine-prilocaine (EMLA) cream Apply to affected area once 30 g 3  . LORazepam (ATIVAN) 0.5 MG tablet Take 1 tablet (0.5 mg total) by mouth at bedtime as needed (Nausea or vomiting). 30 tablet 0  . methocarbamol (ROBAXIN) 500 MG tablet Take 500 mg by mouth 3 (three) times daily as needed for muscle spasms (pain).    . montelukast (SINGULAIR) 10 MG tablet Take 10 mg by mouth every evening.    Marland Kitchen oxyCODONE (OXY IR/ROXICODONE) 5 MG immediate release tablet Take 1 tablet (5 mg total) by mouth every 6 (six) hours as needed for moderate pain, severe pain or breakthrough pain. 8 tablet 0  . pantoprazole (PROTONIX) 40 MG tablet Take 40 mg by mouth  every evening.    . prochlorperazine (COMPAZINE) 10 MG tablet Take 1 tablet (10 mg total) by mouth every 6 (six) hours as needed (Nausea or vomiting). 30 tablet 1  . SYMBICORT 160-4.5 MCG/ACT inhaler INL 2 PFS PO BID  3  . valACYclovir (VALTREX) 500 MG tablet Take 500 mg by mouth See admin instructions. Take 500 mg twice daily for 3 days at onset of outbreak then take 500 mg once daily until clear     No current facility-administered medications for this visit.     OBJECTIVE: Young African-American woman in no acute distress  Vitals:   05/05/18  0931  BP: 111/71  Pulse: 84  Resp: 18  Temp: 97.9 F (36.6 C)  SpO2: 100%     Body mass index is 31.41 kg/m.   Wt Readings from Last 3 Encounters:  05/05/18 212 lb 11.2 oz (96.5 kg)  04/27/18 211 lb 9.6 oz (96 kg)  04/21/18 214 lb 4.8 oz (97.2 kg)  ECOG FS:1  Sclerae unicteric, EOMs intact Oropharynx clear and moist No cervical or supraclavicular adenopathy Lungs no rales or rhonchi Heart regular rate and rhythm Abd soft, nontender, positive bowel sounds MSK no focal spinal tenderness, no upper extremity lymphedema Neuro: nonfocal, well oriented, appropriate affect Breasts: Deferred    LAB RESULTS:  CMP     Component Value Date/Time   NA 138 04/27/2018 0835   K 4.2 04/27/2018 0835   CL 106 04/27/2018 0835   CO2 25 04/27/2018 0835   GLUCOSE 90 04/27/2018 0835   BUN 8 04/27/2018 0835   CREATININE 0.57 04/27/2018 0835   CREATININE 0.67 03/23/2018 0855   CALCIUM 8.5 (L) 04/27/2018 0835   PROT 6.2 (L) 04/27/2018 0835   ALBUMIN 3.4 (L) 04/27/2018 0835   AST 8 (L) 04/27/2018 0835   AST 15 03/23/2018 0855   ALT 11 04/27/2018 0835   ALT 17 03/23/2018 0855   ALKPHOS 118 04/27/2018 0835   BILITOT 1.7 (H) 04/27/2018 0835   BILITOT 1.0 03/23/2018 0855   GFRNONAA >60 04/27/2018 0835   GFRNONAA >60 03/23/2018 0855   GFRAA >60 04/27/2018 0835   GFRAA >60 03/23/2018 0855    No results found for: TOTALPROTELP, ALBUMINELP, A1GS,  A2GS, BETS, BETA2SER, GAMS, MSPIKE, SPEI  No results found for: Nils Pyle, [facility]  Lab Results  Component Value Date   WBC 10.7 (H) 05/05/2018   NEUTROABS PENDING 05/05/2018   HGB 11.6 (L) 05/05/2018   HCT 36.6 05/05/2018   MCV 95.3 05/05/2018   PLT 171 05/05/2018      Chemistry      Component Value Date/Time   NA 138 04/27/2018 0835   K 4.2 04/27/2018 0835   CL 106 04/27/2018 0835   CO2 25 04/27/2018 0835   BUN 8 04/27/2018 0835   CREATININE 0.57 04/27/2018 0835   CREATININE 0.67 03/23/2018 0855      Component Value Date/Time   CALCIUM 8.5 (L) 04/27/2018 0835   ALKPHOS 118 04/27/2018 0835   AST 8 (L) 04/27/2018 0835   AST 15 03/23/2018 0855   ALT 11 04/27/2018 0835   ALT 17 03/23/2018 0855   BILITOT 1.7 (H) 04/27/2018 0835   BILITOT 1.0 03/23/2018 0855       No results found for: LABCA2  No components found for: DTOIZT245  No results for input(s): INR in the last 168 hours.  No results found for: LABCA2  No results found for: CAN199  No results found for: YKD983  No results found for: JAS505  Lab Results  Component Value Date   CA2729 213.7 (H) 04/07/2018    No components found for: HGQUANT  No results found for: CEA1 / No results found for: CEA1   No results found for: AFPTUMOR  No results found for: [place]  No results found for: PSA1  Appointment on 05/05/2018  Component Date Value Ref Range Status  . WBC 05/05/2018 10.7* 4.0 - 10.5 K/uL Final  . RBC 05/05/2018 3.84* 3.87 - 5.11 MIL/uL Final  . Hemoglobin 05/05/2018 11.6* 12.0 - 15.0 g/dL Final  . HCT 05/05/2018 36.6  36.0 - 46.0 % Final  . MCV  05/05/2018 95.3  80.0 - 100.0 fL Final  . MCH 05/05/2018 30.2  26.0 - 34.0 pg Final  . MCHC 05/05/2018 31.7  30.0 - 36.0 g/dL Final  . RDW 05/05/2018 12.6  11.5 - 15.5 % Final  . Platelets 05/05/2018 171  150 - 400 K/uL Final  . nRBC 05/05/2018 0.8* 0.0 - 0.2 % Final   Performed at [facility],  [place] [address] [place], [place] [address]  . Neutrophils Relative % 05/05/2018 PENDING  % Incomplete  . Neutro Abs 05/05/2018 PENDING  1.7 - 7.7 K/uL Incomplete  . Band Neutrophils 05/05/2018 PENDING  % Incomplete  . Lymphocytes Relative 05/05/2018 PENDING  % Incomplete  . Lymphs Abs 05/05/2018 PENDING  0.7 - 4.0 K/uL Incomplete  . Monocytes Relative 05/05/2018 PENDING  % Incomplete  . Monocytes Absolute 05/05/2018 PENDING  0.1 - 1.0 K/uL Incomplete  . Eosinophils Relative 05/05/2018 PENDING  % Incomplete  . Eosinophils Absolute 05/05/2018 PENDING  0.0 - 0.5 K/uL Incomplete  . Basophils Relative 05/05/2018 PENDING  % Incomplete  . Basophils Absolute 05/05/2018 PENDING  0.0 - 0.1 K/uL Incomplete  . WBC Morphology 05/05/2018 PENDING   Incomplete  . RBC Morphology 05/05/2018 PENDING   Incomplete  . Smear Review 05/05/2018 PENDING   Incomplete  . Other 05/05/2018 PENDING  % Incomplete  . nRBC 05/05/2018 PENDING  0 /100 WBC Incomplete  . Metamyelocytes Relative 05/05/2018 PENDING  % Incomplete  . Myelocytes 05/05/2018 PENDING  % Incomplete  . Promyelocytes Relative 05/05/2018 PENDING  % Incomplete  . Blasts 05/05/2018 PENDING  % Incomplete    (this displays the last labs from the last 3 days)  No results found for: TOTALPROTELP, ALBUMINELP, A1GS, A2GS, BETS, BETA2SER, GAMS, MSPIKE, SPEI (this displays SPEP labs)  No results found for: KPAFRELGTCHN, LAMBDASER, KAPLAMBRATIO (kappa/lambda light chains)  No results found for: HGBA, HGBA2QUANT, HGBFQUANT, HGBSQUAN (Hemoglobinopathy evaluation)   No results found for: LDH  No results found for: IRON, TIBC, IRONPCTSAT (Iron and TIBC)  No results found for: FERRITIN  Urinalysis No results found for: COLORURINE, APPEARANCEUR, LABSPEC, PHURINE, GLUCOSEU, HGBUR, BILIRUBINUR, KETONESUR, PROTEINUR, UROBILINOGEN, NITRITE, LEUKOCYTESUR   STUDIES: Ct Soft Tissue Neck W Contrast  Result Date: 04/05/2018 CLINICAL DATA:  Invasive cancer of  the upper outer quadrant of the left breast. Estrogen receptor negative. Stage IIIA. EXAM: CT NECK WITH CONTRAST TECHNIQUE: Multidetector CT imaging of the neck was performed using the standard protocol following the bolus administration of intravenous contrast. CONTRAST:  19m OMNIPAQUE IOHEXOL 300 MG/ML  SOLN COMPARISON:  None. FINDINGS: Pharynx and larynx: No mucosal or submucosal lesion is seen. Salivary glands: Parotid and submandibular glands are normal. Thyroid: Mild prominence of the thyroid gland but without evidence of visible focal lesion. Lymph nodes: No abnormality of the right-sided neck nodes. No abnormality of the upper left-sided nodal stations. There is extensive adenopathy in the supraclavicular region with multiple enlarged lymph nodes, index node on image 97 measuring 16 x 11 mm. Vascular: Arterial and venous structures appear patent and normal. Right internal jugular central line in place. Limited intracranial: On the initial image, there is a focus of enhancement in the left frontal region that is indeterminate. This could represent a small meningioma, partial volume with the skull, vascular abnormality or small enhancing metastasis. Complete head CT with contrast or brain MRI would be suggested. Visualized orbits: Normal Mastoids and visualized paranasal sinuses: Clear Skeleton: Normal Upper chest: See results of chest CT. Other: None IMPRESSION: Adenopathy in the left  supraclavicular region consistent with metastatic disease. This is primarily level 5B. Index node measured on image 97 measures 16 x 11 mm. Enlarged thyroid but without evidence of definable focal lesion. Probable early thyroid goiter. Small enhancing focus seen on image number 1 in the left frontal region which is indeterminate. The differential diagnosis is meningioma versus is partial wiring artifact of the skull versus vascular abnormality versus metastasis. Consider complete head CT with contrast or brain MRI.  Electronically Signed   By: Nelson Chimes M.D.   On: 04/05/2018 13:48   Ct Chest W Contrast  Result Date: 04/05/2018 CLINICAL DATA:  New diagnosis of left breast cancer, stage IIIA. Initial chest staging evaluation. EXAM: CT CHEST WITH CONTRAST TECHNIQUE: Multidetector CT imaging of the chest was performed during intravenous contrast administration. CONTRAST:  78m OMNIPAQUE IOHEXOL 300 MG/ML  SOLN COMPARISON:  03/30/2018 chest radiograph. FINDINGS: Cardiovascular: Top-normal heart size. No significant pericardial effusion/thickening. Right internal jugular MediPort terminates in the lower third of the SVC. Great vessels are normal in course and caliber. No central pulmonary emboli. Mediastinum/Nodes: No discrete thyroid nodules. Unremarkable esophagus. Multiple bulky left axillary nodes measuring up to 3.4 cm short axis (series 4/image 15). Poorly marginated left subpectoral adenopathy, which appears necrotic, measuring up to 2.1 cm short axis diameter (series 4/image 16). Enlarged 1.1 cm left supraclavicular node (series 2/image 7). No right axillary adenopathy. No pathologically enlarged mediastinal or hilar nodes. Lungs/Pleura: No pneumothorax. No pleural effusion. No acute consolidative airspace disease or lung masses. Peripheral left upper lobe 3 mm solid pulmonary nodule (series 7/image 50). No additional significant pulmonary nodules. 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 Upper abdomen: No acute abnormality. Musculoskeletal: No aggressive appearing focal osseous lesions. Mild-to-moderate long segment reverse S shaped thoracic spine curvature. Left breast 2.4 cm hyperenhancing mass with internal biopsy clip (series 4/image 30). Asymmetric left breast skin thickening. IMPRESSION: 1. Bulky left axillary and chronic left retropectoral adenopathy. Mild left supraclavicular adenopathy. 2. Asymmetric left  breast skin thickening and left breast mass compatible with known malignancy. 3. Solitary tiny 3 mm peripheral left upper lobe pulmonary nodule, for which follow-up chest CT is advised in 3-6 months. 4. 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 Electronically Signed   By: JIlona SorrelM.D.   On: 04/05/2018 14:15   Mr BJeri CosWXHContrast  Result Date: 04/07/2018 CLINICAL DATA:  Breast cancer. Rule out metastatic disease. Question left frontal lesion on recent neck CT EXAM: MRI HEAD WITHOUT AND WITH CONTRAST TECHNIQUE: Multiplanar, multiecho pulse sequences of the brain and surrounding structures were obtained without and with intravenous contrast. CONTRAST:  9 mL Gadovist IV COMPARISON:  CT neck left 09/2017 FINDINGS: Brain: No acute infarction, hemorrhage, hydrocephalus, extra-axial collection or mass lesion. Normal enhancement postcontrast administration. Negative for metastatic disease in the left frontal lobe. CT finding apparently was volume averaging of the skull. Vascular: Normal arterial flow voids Skull and upper cervical spine: Negative Sinuses/Orbits: Negative Other: Not IMPRESSION: Negative MRI head with contrast.  Negative for metastatic disease Electronically Signed   By: CFranchot GalloM.D.   On: 04/07/2018 07:50   Nm Bone Scan Whole Body  Result Date: 04/06/2018 CLINICAL DATA:  47year old female with breast cancer, on going there. Denies bone pain. EXAM: NUCLEAR MEDICINE WHOLE BODY BONE SCAN TECHNIQUE: Whole body anterior and posterior images were obtained approximately 3 hours after intravenous injection of radiopharmaceutical. RADIOPHARMACEUTICALS:  19.5 mCi Technetium-84mMDP IV COMPARISON:  Chest CT 04/05/2018. FINDINGS: Expected radiotracer activity in both kidneys in the urinary bladder. Thoracolumbar scoliosis. Homogeneous radiotracer activity throughout the axial skeleton, including the skull. Homogeneous radiotracer activity throughout the visible  appendicular skeleton. Asymmetric left breast soft tissue activity. IMPRESSION: No findings specific for metastatic disease to bone. Electronically Signed   By: HGenevie AnnM.D.   On: 04/06/2018 07:17     ELIGIBLE FOR AVAILABLE RESEARCH PROTOCOL:  May eventually qualify for BR004; UPBEAT  ASSESSMENT: 47y.o. [place] woman status post left breast overlapping site biopsy and left axillary lymph node biopsy 03/16/2018 for a clinical T2 N3, stage IIIC invasive ductal carcinoma, triple negative, with an MIB-1 of 15%   (1) Staging studies  (a) CT chest on 04/05/2018: left axillary and chronic left retropectoral adenopathy, supraclavicular adenopathy, left breast skin thickening and mass, 369mleft upper lobe pulmonary nodule (repeat ct chest in 3-6 months recommended), bronchiectasis in left lower lobe.  (b) CT neck on 04/05/2018: left supraclavicular adenopathy, ? Abnormality in left frontal lobe of brain  (c) Bone scan on 04/05/2018: no findings suggestive of metastatic disease to bone  (d) MRI brain 04/07/2018: negative  (2) neoadjuvant chemotherapy to consist of doxorubicin and cyclophosphamide in dose dense fashion x4 followed by weekly carboplatin and paclitaxel x12  (3) genetics testing 11/18 pending  (4) definitive surgery to follow  (5) adjuvant radiation  PLAN: MaTsions having some viral breakthrough and we are putting her on Valtrex daily.  Paula Berry may also continue to use the Valtrex cream as needed.  Paula Berry is taking off Mondays and Tuesdays as well as Fridays after chemo and I think this is wise.  Paula Berry needs to hydrate and rest a little to catch up.  However I also have encouraged her to do a little bit more walking, which I think will help her get through the remaining cycles of this more intense therapy.  Paula Berry will see [place] in a week.  Paula Berry knows to call for any other issues that may develop before that visit.  Elizbeth Posa, GuVirgie DadMD  05/05/18 9:57 AM Medical Oncology and  Hematology CoDr [facility]vStone Lake[place] [address]el. [phone]  Fax. [phone] I, AmJacqualyn Poseym acting as a scEducation administratoror GuChauncey CruelMD.   I, GuLurline DelD, have reviewed the above documentation for accuracy and completeness, and I agree with the above.

## 2018-05-05 ENCOUNTER — Encounter: Payer: Self-pay | Admitting: *Deleted

## 2018-05-05 ENCOUNTER — Ambulatory Visit: Payer: BC Managed Care – PPO

## 2018-05-05 ENCOUNTER — Inpatient Hospital Stay: Payer: BC Managed Care – PPO | Attending: Oncology | Admitting: Oncology

## 2018-05-05 ENCOUNTER — Inpatient Hospital Stay: Payer: BC Managed Care – PPO

## 2018-05-05 ENCOUNTER — Other Ambulatory Visit: Payer: BC Managed Care – PPO

## 2018-05-05 VITALS — BP 111/71 | HR 84 | Temp 97.9°F | Resp 18 | Ht 69.0 in | Wt 212.7 lb

## 2018-05-05 DIAGNOSIS — Z90710 Acquired absence of both cervix and uterus: Secondary | ICD-10-CM | POA: Insufficient documentation

## 2018-05-05 DIAGNOSIS — K219 Gastro-esophageal reflux disease without esophagitis: Secondary | ICD-10-CM | POA: Insufficient documentation

## 2018-05-05 DIAGNOSIS — R197 Diarrhea, unspecified: Secondary | ICD-10-CM | POA: Insufficient documentation

## 2018-05-05 DIAGNOSIS — R112 Nausea with vomiting, unspecified: Secondary | ICD-10-CM | POA: Insufficient documentation

## 2018-05-05 DIAGNOSIS — Z5111 Encounter for antineoplastic chemotherapy: Secondary | ICD-10-CM | POA: Insufficient documentation

## 2018-05-05 DIAGNOSIS — R911 Solitary pulmonary nodule: Secondary | ICD-10-CM | POA: Diagnosis not present

## 2018-05-05 DIAGNOSIS — C50412 Malignant neoplasm of upper-outer quadrant of left female breast: Secondary | ICD-10-CM

## 2018-05-05 DIAGNOSIS — B349 Viral infection, unspecified: Secondary | ICD-10-CM | POA: Diagnosis not present

## 2018-05-05 DIAGNOSIS — Z7689 Persons encountering health services in other specified circumstances: Secondary | ICD-10-CM | POA: Diagnosis not present

## 2018-05-05 DIAGNOSIS — R5383 Other fatigue: Secondary | ICD-10-CM | POA: Diagnosis not present

## 2018-05-05 DIAGNOSIS — R232 Flushing: Secondary | ICD-10-CM | POA: Diagnosis not present

## 2018-05-05 DIAGNOSIS — R630 Anorexia: Secondary | ICD-10-CM | POA: Insufficient documentation

## 2018-05-05 DIAGNOSIS — Z79899 Other long term (current) drug therapy: Secondary | ICD-10-CM | POA: Insufficient documentation

## 2018-05-05 DIAGNOSIS — Z171 Estrogen receptor negative status [ER-]: Secondary | ICD-10-CM

## 2018-05-05 DIAGNOSIS — Z801 Family history of malignant neoplasm of trachea, bronchus and lung: Secondary | ICD-10-CM

## 2018-05-05 DIAGNOSIS — Z95828 Presence of other vascular implants and grafts: Secondary | ICD-10-CM

## 2018-05-05 DIAGNOSIS — R5381 Other malaise: Secondary | ICD-10-CM | POA: Diagnosis not present

## 2018-05-05 DIAGNOSIS — Z808 Family history of malignant neoplasm of other organs or systems: Secondary | ICD-10-CM

## 2018-05-05 DIAGNOSIS — R0982 Postnasal drip: Secondary | ICD-10-CM | POA: Insufficient documentation

## 2018-05-05 LAB — CBC WITH DIFFERENTIAL/PLATELET
Abs Immature Granulocytes: 0.5 10*3/uL — ABNORMAL HIGH (ref 0.00–0.07)
Basophils Absolute: 0 10*3/uL (ref 0.0–0.1)
Basophils Relative: 0 %
EOS ABS: 0 10*3/uL (ref 0.0–0.5)
Eosinophils Relative: 0 %
HCT: 36.6 % (ref 36.0–46.0)
Hemoglobin: 11.6 g/dL — ABNORMAL LOW (ref 12.0–15.0)
Immature Granulocytes: 5 %
Lymphocytes Relative: 12 %
Lymphs Abs: 1.3 10*3/uL (ref 0.7–4.0)
MCH: 30.2 pg (ref 26.0–34.0)
MCHC: 31.7 g/dL (ref 30.0–36.0)
MCV: 95.3 fL (ref 80.0–100.0)
Monocytes Absolute: 1.3 10*3/uL — ABNORMAL HIGH (ref 0.1–1.0)
Monocytes Relative: 12 %
NRBC: 0.8 % — AB (ref 0.0–0.2)
Neutro Abs: 7.6 10*3/uL (ref 1.7–7.7)
Neutrophils Relative %: 71 %
PLATELETS: 171 10*3/uL (ref 150–400)
RBC: 3.84 MIL/uL — ABNORMAL LOW (ref 3.87–5.11)
RDW: 12.6 % (ref 11.5–15.5)
WBC: 10.7 10*3/uL — AB (ref 4.0–10.5)

## 2018-05-05 LAB — COMPREHENSIVE METABOLIC PANEL
ALBUMIN: 3.7 g/dL (ref 3.5–5.0)
ALT: 11 U/L (ref 0–44)
AST: 11 U/L — ABNORMAL LOW (ref 15–41)
Alkaline Phosphatase: 101 U/L (ref 38–126)
Anion gap: 10 (ref 5–15)
BUN: 8 mg/dL (ref 6–20)
CO2: 24 mmol/L (ref 22–32)
Calcium: 8.9 mg/dL (ref 8.9–10.3)
Chloride: 109 mmol/L (ref 98–111)
Creatinine, Ser: 0.74 mg/dL (ref 0.44–1.00)
GFR calc Af Amer: >60 mL/min (ref >60)
GFR calc non Af Amer: >60 mL/min (ref >60)
Glucose, Bld: 100 mg/dL — ABNORMAL HIGH (ref 70–99)
Potassium: 4.1 mmol/L (ref 3.5–5.1)
SODIUM: 143 mmol/L (ref 135–145)
Total Bilirubin: 0.3 mg/dL (ref 0.3–1.2)
Total Protein: 6.8 g/dL (ref 6.5–8.1)

## 2018-05-05 MED ORDER — SODIUM CHLORIDE 0.9% FLUSH
10.0000 mL | INTRAVENOUS | Status: DC | PRN
  Administered 2018-05-05: 10 mL
  Filled 2018-05-05: qty 10

## 2018-05-05 MED ORDER — HEPARIN SOD (PORK) LOCK FLUSH 100 UNIT/ML IV SOLN
500.0000 [IU] | Freq: Once | INTRAVENOUS | Status: AC | PRN
  Administered 2018-05-05: 500 [IU]
  Filled 2018-05-05: qty 5

## 2018-05-05 MED ORDER — SODIUM CHLORIDE 0.9% FLUSH
10.0000 mL | Freq: Once | INTRAVENOUS | Status: AC
  Administered 2018-05-05: 10 mL
  Filled 2018-05-05: qty 10

## 2018-05-05 MED ORDER — PALONOSETRON HCL INJECTION 0.25 MG/5ML
INTRAVENOUS | Status: AC
  Filled 2018-05-05: qty 5

## 2018-05-05 MED ORDER — SODIUM CHLORIDE 0.9 % IV SOLN
Freq: Once | INTRAVENOUS | Status: AC
  Administered 2018-05-05: 11:00:00 via INTRAVENOUS
  Filled 2018-05-05: qty 5

## 2018-05-05 MED ORDER — PALONOSETRON HCL INJECTION 0.25 MG/5ML
0.2500 mg | Freq: Once | INTRAVENOUS | Status: AC
  Administered 2018-05-05: 0.25 mg via INTRAVENOUS

## 2018-05-05 MED ORDER — SODIUM CHLORIDE 0.9 % IV SOLN
600.0000 mg/m2 | Freq: Once | INTRAVENOUS | Status: AC
  Administered 2018-05-05: 1300 mg via INTRAVENOUS
  Filled 2018-05-05: qty 65

## 2018-05-05 MED ORDER — SODIUM CHLORIDE 0.9 % IV SOLN
Freq: Once | INTRAVENOUS | Status: AC
  Administered 2018-05-05: 10:00:00 via INTRAVENOUS
  Filled 2018-05-05: qty 250

## 2018-05-05 MED ORDER — DOXORUBICIN HCL CHEMO IV INJECTION 2 MG/ML
60.0000 mg/m2 | Freq: Once | INTRAVENOUS | Status: AC
  Administered 2018-05-05: 130 mg via INTRAVENOUS
  Filled 2018-05-05: qty 65

## 2018-05-05 NOTE — Patient Instructions (Signed)
Cancer Center Discharge Instructions for Patients Receiving Chemotherapy  Today you received the following chemotherapy agents Adriamycin and Cytoxan  To help prevent nausea and vomiting after your treatment, we encourage you to take your nausea medication as directed.  If you develop nausea and vomiting that is not controlled by your nausea medication, call the clinic.   BELOW ARE SYMPTOMS THAT SHOULD BE REPORTED IMMEDIATELY:  *FEVER GREATER THAN 100.5 F  *CHILLS WITH OR WITHOUT FEVER  NAUSEA AND VOMITING THAT IS NOT CONTROLLED WITH YOUR NAUSEA MEDICATION  *UNUSUAL SHORTNESS OF BREATH  *UNUSUAL BRUISING OR BLEEDING  TENDERNESS IN MOUTH AND THROAT WITH OR WITHOUT PRESENCE OF ULCERS  *URINARY PROBLEMS  *BOWEL PROBLEMS  UNUSUAL RASH Items with * indicate a potential emergency and should be followed up as soon as possible.  Feel free to call the clinic should you have any questions or concerns. The clinic phone number is [phone].  Please show the CHEMO ALERT CARD at check-in to the Emergency Department and triage nurse.   

## 2018-05-07 ENCOUNTER — Inpatient Hospital Stay: Payer: BC Managed Care – PPO

## 2018-05-07 VITALS — BP 121/77 | HR 88 | Temp 98.3°F | Resp 14

## 2018-05-07 DIAGNOSIS — C50412 Malignant neoplasm of upper-outer quadrant of left female breast: Secondary | ICD-10-CM | POA: Diagnosis not present

## 2018-05-07 DIAGNOSIS — Z171 Estrogen receptor negative status [ER-]: Principal | ICD-10-CM

## 2018-05-07 MED ORDER — PEGFILGRASTIM-CBQV 6 MG/0.6ML ~~LOC~~ SOSY
PREFILLED_SYRINGE | SUBCUTANEOUS | Status: AC
  Filled 2018-05-07: qty 0.6

## 2018-05-07 MED ORDER — PEGFILGRASTIM-CBQV 6 MG/0.6ML ~~LOC~~ SOSY
6.0000 mg | PREFILLED_SYRINGE | Freq: Once | SUBCUTANEOUS | Status: AC
  Administered 2018-05-07: 6 mg via SUBCUTANEOUS

## 2018-05-12 ENCOUNTER — Inpatient Hospital Stay (HOSPITAL_BASED_OUTPATIENT_CLINIC_OR_DEPARTMENT_OTHER): Payer: BC Managed Care – PPO | Admitting: Adult Health

## 2018-05-12 ENCOUNTER — Inpatient Hospital Stay: Payer: BC Managed Care – PPO

## 2018-05-12 ENCOUNTER — Encounter: Payer: Self-pay | Admitting: Adult Health

## 2018-05-12 VITALS — BP 117/68 | HR 96 | Temp 98.3°F | Resp 18 | Ht 69.0 in | Wt 210.9 lb

## 2018-05-12 DIAGNOSIS — C50412 Malignant neoplasm of upper-outer quadrant of left female breast: Secondary | ICD-10-CM

## 2018-05-12 DIAGNOSIS — K219 Gastro-esophageal reflux disease without esophagitis: Secondary | ICD-10-CM

## 2018-05-12 DIAGNOSIS — Z808 Family history of malignant neoplasm of other organs or systems: Secondary | ICD-10-CM

## 2018-05-12 DIAGNOSIS — Z171 Estrogen receptor negative status [ER-]: Secondary | ICD-10-CM | POA: Diagnosis not present

## 2018-05-12 DIAGNOSIS — R5383 Other fatigue: Secondary | ICD-10-CM

## 2018-05-12 DIAGNOSIS — Z90710 Acquired absence of both cervix and uterus: Secondary | ICD-10-CM

## 2018-05-12 DIAGNOSIS — Z801 Family history of malignant neoplasm of trachea, bronchus and lung: Secondary | ICD-10-CM

## 2018-05-12 DIAGNOSIS — Z95828 Presence of other vascular implants and grafts: Secondary | ICD-10-CM

## 2018-05-12 DIAGNOSIS — Z79899 Other long term (current) drug therapy: Secondary | ICD-10-CM

## 2018-05-12 LAB — CBC WITH DIFFERENTIAL/PLATELET
Abs Immature Granulocytes: 0.02 10*3/uL (ref 0.00–0.07)
Basophils Absolute: 0 10*3/uL (ref 0.0–0.1)
Basophils Relative: 1 %
EOS ABS: 0 10*3/uL (ref 0.0–0.5)
Eosinophils Relative: 0 %
HCT: 32.2 % — ABNORMAL LOW (ref 36.0–46.0)
HEMOGLOBIN: 10.7 g/dL — AB (ref 12.0–15.0)
Immature Granulocytes: 1 %
LYMPHS PCT: 29 %
Lymphs Abs: 0.4 10*3/uL — ABNORMAL LOW (ref 0.7–4.0)
MCH: 30.8 pg (ref 26.0–34.0)
MCHC: 33.2 g/dL (ref 30.0–36.0)
MCV: 92.8 fL (ref 80.0–100.0)
Monocytes Absolute: 0.1 10*3/uL (ref 0.1–1.0)
Monocytes Relative: 8 %
NEUTROS ABS: 0.9 10*3/uL — AB (ref 1.7–7.7)
Neutrophils Relative %: 61 %
Platelets: 143 10*3/uL — ABNORMAL LOW (ref 150–400)
RBC: 3.47 MIL/uL — ABNORMAL LOW (ref 3.87–5.11)
RDW: 12.5 % (ref 11.5–15.5)
WBC: 1.4 10*3/uL — ABNORMAL LOW (ref 4.0–10.5)
nRBC: 0 % (ref 0.0–0.2)

## 2018-05-12 MED ORDER — HEPARIN SOD (PORK) LOCK FLUSH 100 UNIT/ML IV SOLN
500.0000 [IU] | Freq: Once | INTRAVENOUS | Status: AC
  Administered 2018-05-12: 500 [IU]
  Filled 2018-05-12: qty 5

## 2018-05-12 MED ORDER — SODIUM CHLORIDE 0.9% FLUSH
10.0000 mL | Freq: Once | INTRAVENOUS | Status: AC
  Administered 2018-05-12: 10 mL
  Filled 2018-05-12: qty 10

## 2018-05-12 NOTE — Progress Notes (Signed)
[place]  [phone] [phone]     ID: [name] DOB: [birth year]  MR#: [id number]  VZD#:[id number]  Patient Care Team: Vernie Shanks, MD as PCP - General (Family Medicine) Rolm Bookbinder, MD as Consulting Physician (General Surgery) Magrinat, Virgie Dad, MD as Consulting Physician (Oncology) Eppie Gibson, MD as Attending Physician (Radiation Oncology) Everlene Farrier, MD as Consulting Physician (Obstetrics and Gynecology) Scot Dock, NP OTHER MD:  CHIEF COMPLAINT: Triple negative breast cancer  CURRENT TREATMENT: Neoadjuvant chemotherapy   HISTORY OF CURRENT ILLNESS: From the original intake note:  The patient had a left mammogram with tomography and ultrasonography 07/12/2015 at the [facility] after screening recall for a possible left breast mass.  This found the breast density to be category C.  There was a small lobulated mass in the lower outer aspect of the left breast, which was not palpable.  Ultrasound showed a small lobulated cyst in that area measuring 0.6 cm.  This was felt to be benign.  In July 2019 she had screening mammography at Dr. Clementeen Hoof office.  I do not have that report but according to the patient it was unremarkable.  In October 2019 however the patient developed left axillary swelling and tenderness and was again set up for left mammography with tomography and left breast ultrasonography at the [facility], 03/11/2018.  Breast density was category C.  Mammography showed numerous markedly enlarged left axillary lymph nodes.  There was periareolar skin thickening.  There was an area of ill-defined distortion in the upper outer left breast and on physical exam there was a firm lump superior and lateral to the left nipple, with swelling of the left axilla.  Ultrasound of the left breast and both upper quadrants found an ill-defined irregular hypoechoic area measuring at least 4.2 cm.  There also  multiple scattered cysts.  Ultrasound of the left axilla showed at least 6 abnormal lymph nodes.  On 03/16/2018 the patient underwent right mammography with tomography and right breast ultrasonography at the [facility].  Again breast density was category C.  There were multiple circumscribed equal density masses in the upper outer quadrant of the right breast which appeared stable.  Ultrasound showed diffuse fibrocystic changes.  An additional hypoechoic mass was noted at the 9 o'clock position 3 cm from the nipple measuring 0.8 cm, with no associated vascularity.  A complex solid and cystic mass was noted superficially at the 6 o'clock position 3 cm from the nipple measuring 1.6 cm.  Evaluation of the right axilla was negative.  On 03/18/2018 the patient underwent biopsy of the 2 suspicious areas in the right breast, and they both showed only fibrocystic changes with some chronic inflammation ([id number]).  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  The patient's subsequent history is as detailed below.  INTERVAL HISTORY: Kalla returns today for follow-up of her triple negative breast cancer.   The patient continues on neoadjuvant chemotherapy with Doxorubicin and Cyclophosphamide She is on day 8 cycle 3 of 4 planned cycles.    REVIEW OF SYSTEMS: Jelene is fatigued.  This was worst the first two days after chemotherapy.  She is concerned about her left breast feeling heavy again and she notes a discomfort.  She is worried about what this may  mean.  She has not been taking Valtrex daily, but promises to do better.  She also notes that she had a pustular area that cleared up after warm compresses in her vulvar region.  It was not herpetic.  She has never had a lesion like  this before, but is clear that it isn't her typical herpetic lesions.  Otherwise Sakia is doing well.  She denies fevers, chills.  She is without headaches, vision changes.  She is not noting nausea, vomiting, constipation, diarrhea.  She is without chest pain, palpitations, cough, shortness of breath.  A detailed ROS was otherwise non contributory.    PAST MEDICAL HISTORY: Past Medical History:  Diagnosis Date  . Anemia    had iron infusion 08/12/15  . Asthma   . Cancer [facility])    Breast cancer   . Genital herpes   . GERD (gastroesophageal reflux disease)   . Headache    migraines 2-3 times monthly  . Pneumonia    hospitalizied for pneumonia as a child    PAST SURGICAL HISTORY: Past Surgical History:  Procedure Laterality Date  . ABDOMINAL HYSTERECTOMY Bilateral 08/22/2015   Procedure: HYSTERECTOMY ABDOMINAL, BILATERAL SALPINGECTOMY;  Surgeon: Everlene Farrier, MD;  Location: [facility];  Service: Gynecology;  Laterality: Bilateral;  . MYOMECTOMY ABDOMINAL APPROACH    . PORTACATH PLACEMENT N/A 03/30/2018   Procedure: INSERTION PORT-A-CATH WITH [place];  Surgeon: Rolm Bookbinder, MD;  Location: [facility];  Service: General;  Laterality: N/A;    FAMILY HISTORY Family History  Problem Relation Age of Onset  . Hypertension Unknown   . Diabetes Cousin        mat first cousin  . Cancer Unknown   . Dementia Maternal Grandfather   . Lung cancer Paternal Grandmother        d. 71-60  . Brain cancer Paternal Aunt        d. 50, father's mat 1/2 sister  . Diabetes Maternal Grandmother   . Other Paternal Grandfather        d. WWII  . Aneurysm Maternal Uncle        brain  . Migraines Neg Hx   The patient's parents are in their early 48s as of October 2019.  A paternal aunt had brain cancer at age 64.  A paternal grandmother had lung cancer at age 55.  There is no breast or ovarian cancer history in the family.  However note that the patient has little information on her mother side of the  family.  GYNECOLOGIC HISTORY:  No LMP recorded. Patient has had a hysterectomy. 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  Her husband [name]. works for the [employer].  He is a former patient here, with stage IV colon cancer.  They live alone, with no pets.     ADVANCED DIRECTIVES: Not in place   HEALTH MAINTENANCE: Social History   Tobacco Use  . Smoking status: Never Smoker  . Smokeless tobacco: Never Used  Substance Use Topics  . Alcohol use: Yes    Alcohol/week: 1.0 standard drinks    Types: 1 Glasses of wine per week    Comment: daily  . Drug use: No     Colonoscopy: Never  PAP: Status post  hysterectomy  Bone density: Never   Allergies  Allergen Reactions  . Neosporin [Neomycin-Bacitracin Zn-Polymyx] Other (See Comments)    Decreased wound healing with blisters    Current Outpatient Medications  Medication Sig Dispense Refill  . acetaminophen (TYLENOL) 500 MG tablet Take 1,000 mg by mouth every 8 (eight) hours as needed for moderate pain.    Marland Kitchen acyclovir ointment (ZOVIRAX) 5 % Apply 1 application topically 4 (four) times daily as needed (outbreaks).   2  . albuterol (PROVENTIL HFA;VENTOLIN HFA) 108 (90 Base) MCG/ACT inhaler Inhale 2 puffs into the lungs every 6 (six) hours as needed for wheezing or shortness of breath.     . ciprofloxacin (CIPRO) 500 MG tablet Take 1 tablet (500 mg total) by mouth 2 (two) times daily. 14 tablet 0  . dexamethasone (DECADRON) 4 MG tablet Take 2 tablets by mouth once a day on the day after chemotherapy and then take 2 tablets two times a day for 2 days. Take with food. 30 tablet 1  . lidocaine-prilocaine (EMLA) cream Apply to affected area once 30 g 3  .  LORazepam (ATIVAN) 0.5 MG tablet Take 1 tablet (0.5 mg total) by mouth at bedtime as needed (Nausea or vomiting). 30 tablet 0  . methocarbamol (ROBAXIN) 500 MG tablet Take 500 mg by mouth 3 (three) times daily as needed for muscle spasms (pain).    . montelukast (SINGULAIR) 10 MG tablet Take 10 mg by mouth every evening.    Marland Kitchen oxyCODONE (OXY IR/ROXICODONE) 5 MG immediate release tablet Take 1 tablet (5 mg total) by mouth every 6 (six) hours as needed for moderate pain, severe pain or breakthrough pain. 8 tablet 0  . pantoprazole (PROTONIX) 40 MG tablet Take 40 mg by mouth every evening.    . prochlorperazine (COMPAZINE) 10 MG tablet Take 1 tablet (10 mg total) by mouth every 6 (six) hours as needed (Nausea or vomiting). 30 tablet 1  . SYMBICORT 160-4.5 MCG/ACT inhaler INL 2 PFS PO BID  3  . valACYclovir (VALTREX) 500 MG tablet Take 500 mg by mouth See admin instructions. Take 500 mg twice daily for 3 days at onset of outbreak then take 500 mg once daily until clear     No current facility-administered medications for this visit.     OBJECTIVE:  Vitals:   05/12/18 1357  BP: 117/68  Pulse: 96  Resp: 18  Temp: 98.3 F (36.8 C)  SpO2: 100%     Body mass index is 31.14 kg/m.   Wt Readings from Last 3 Encounters:  05/12/18 210 lb 14.4 oz (95.7 kg)  05/05/18 212 lb 11.2 oz (96.5 kg)  04/27/18 211 lb 9.6 oz (96 kg)  ECOG FS:1 GENERAL: Patient is a well appearing female in no acute distress HEENT:  Sclerae anicteric.  Oropharynx clear and moist. No ulcerations or evidence of oropharyngeal candidiasis. Neck is supple.  NODES:  No cervical, supraclavicular, or axillary lymphadenopathy palpated.  BREAST EXAM:  Left axillary mass is smaller and softer, breast nodularity is softer as well  LUNGS:  Clear to auscultation bilaterally.  No wheezes or rhonchi. HEART:  Regular rate and rhythm. No murmur appreciated. ABDOMEN:  Soft, nontender.  Positive, normoactive bowel sounds. No organomegaly  palpated. MSK:  No focal spinal tenderness to palpation. Full range of motion bilaterally in the upper extremities. EXTREMITIES:  No peripheral edema.   SKIN:  Clear with no obvious rashes or skin changes. No nail dyscrasia. NEURO:  Nonfocal. Well oriented.  Appropriate affect.  LAB RESULTS:  CMP     Component Value Date/Time   NA 143 05/05/2018 0913   K 4.1 05/05/2018 0913   CL 109 05/05/2018 0913   CO2 24 05/05/2018 0913   GLUCOSE 100 (H) 05/05/2018 0913   BUN 8 05/05/2018 0913   CREATININE 0.74 05/05/2018 0913   CREATININE 0.67 03/23/2018 0855   CALCIUM 8.9 05/05/2018 0913   PROT 6.8 05/05/2018 0913   ALBUMIN 3.7 05/05/2018 0913   AST 11 (L) 05/05/2018 0913   AST 15 03/23/2018 0855   ALT 11 05/05/2018 0913   ALT 17 03/23/2018 0855   ALKPHOS 101 05/05/2018 0913   BILITOT 0.3 05/05/2018 0913   BILITOT 1.0 03/23/2018 0855   GFRNONAA >60 05/05/2018 0913   GFRNONAA >60 03/23/2018 0855   GFRAA >60 05/05/2018 0913   GFRAA >60 03/23/2018 0855    No results found for: TOTALPROTELP, ALBUMINELP, A1GS, A2GS, BETS, BETA2SER, GAMS, MSPIKE, SPEI  No results found for: Nils Pyle, [facility]  Lab Results  Component Value Date   WBC 1.4 (L) 05/12/2018   NEUTROABS 0.9 (L) 05/12/2018   HGB 10.7 (L) 05/12/2018   HCT 32.2 (L) 05/12/2018   MCV 92.8 05/12/2018   PLT 143 (L) 05/12/2018      Chemistry      Component Value Date/Time   NA 143 05/05/2018 0913   K 4.1 05/05/2018 0913   CL 109 05/05/2018 0913   CO2 24 05/05/2018 0913   BUN 8 05/05/2018 0913   CREATININE 0.74 05/05/2018 0913   CREATININE 0.67 03/23/2018 0855      Component Value Date/Time   CALCIUM 8.9 05/05/2018 0913   ALKPHOS 101 05/05/2018 0913   AST 11 (L) 05/05/2018 0913   AST 15 03/23/2018 0855   ALT 11 05/05/2018 0913   ALT 17 03/23/2018 0855   BILITOT 0.3 05/05/2018 0913   BILITOT 1.0 03/23/2018 0855       No results found for: LABCA2  No components found for:  [id number]  No results for input(s): INR in the last 168 hours.  No results found for: LABCA2  No results found for: CAN199  No results found for: TWK462  No results found for: MMN817  Lab Results  Component Value Date   CA2729 213.7 (H) 04/07/2018    No components found for: HGQUANT  No results found for: CEA1 / No results found for: CEA1   No results found for: AFPTUMOR  No results found for: [place]  No results found for: PSA1  Appointment on 05/12/2018  Component Date Value Ref Range Status  . WBC 05/12/2018 1.4* 4.0 - 10.5 K/uL Final  . RBC 05/12/2018 3.47* 3.87 - 5.11 MIL/uL Final  . Hemoglobin 05/12/2018 10.7* 12.0 - 15.0 g/dL Final  . HCT 05/12/2018 32.2* 36.0 - 46.0 % Final  . MCV 05/12/2018 92.8  80.0 - 100.0 fL Final  . MCH 05/12/2018 30.8  26.0 - 34.0 pg Final  . MCHC 05/12/2018 33.2  30.0 - 36.0 g/dL Final  . RDW 05/12/2018 12.5  11.5 - 15.5 % Final  . Platelets 05/12/2018 143* 150 - 400 K/uL Final  . nRBC 05/12/2018 0.0  0.0 - 0.2 % Final  . Neutrophils Relative % 05/12/2018 61  % Final  . Neutro Abs 05/12/2018 0.9* 1.7 - 7.7 K/uL Final  . Lymphocytes Relative 05/12/2018 29  % Final  . Lymphs Abs 05/12/2018 0.4* 0.7 - 4.0 K/uL Final  . Monocytes Relative 05/12/2018 8  % Final  . Monocytes Absolute 05/12/2018  0.1  0.1 - 1.0 K/uL Final  . Eosinophils Relative 05/12/2018 0  % Final  . Eosinophils Absolute 05/12/2018 0.0  0.0 - 0.5 K/uL Final  . Basophils Relative 05/12/2018 1  % Final  . Basophils Absolute 05/12/2018 0.0  0.0 - 0.1 K/uL Final  . Immature Granulocytes 05/12/2018 1  % Final  . Abs Immature Granulocytes 05/12/2018 0.02  0.00 - 0.07 K/uL Final   Performed at [facility], [place] [address] [place], [place] [address]    (this displays the last labs from the last 3 days)  No results found for: TOTALPROTELP, ALBUMINELP, A1GS, A2GS, BETS, BETA2SER, GAMS, MSPIKE, SPEI (this displays SPEP labs)  No results found for:  KPAFRELGTCHN, LAMBDASER, KAPLAMBRATIO (kappa/lambda light chains)  No results found for: HGBA, HGBA2QUANT, HGBFQUANT, HGBSQUAN (Hemoglobinopathy evaluation)   No results found for: LDH  No results found for: IRON, TIBC, IRONPCTSAT (Iron and TIBC)  No results found for: FERRITIN  Urinalysis No results found for: COLORURINE, APPEARANCEUR, LABSPEC, PHURINE, GLUCOSEU, HGBUR, BILIRUBINUR, KETONESUR, PROTEINUR, UROBILINOGEN, NITRITE, LEUKOCYTESUR   STUDIES: No results found.   ELIGIBLE FOR AVAILABLE RESEARCH PROTOCOL:  May eventually qualify for BR004; UPBEAT  ASSESSMENT: 47 y.o. [place] woman status post left breast overlapping site biopsy and left axillary lymph node biopsy 03/16/2018 for a clinical T2 N3, stage IIIC invasive ductal carcinoma, triple negative, with an MIB-1 of 15%   (1) Staging studies  (a) CT chest on 04/05/2018: left axillary and chronic left retropectoral adenopathy, supraclavicular adenopathy, left breast skin thickening and mass, 66m left upper lobe pulmonary nodule (repeat ct chest in 3-6 months recommended), bronchiectasis in left lower lobe.  (b) CT neck on 04/05/2018: left supraclavicular adenopathy, ? Abnormality in left frontal lobe of brain  (c) Bone scan on 04/05/2018: no findings suggestive of metastatic disease to bone  (d) MRI brain 04/07/2018: negative  (2) neoadjuvant chemotherapy to consist of doxorubicin and cyclophosphamide in dose dense fashion x4 followed by weekly carboplatin and paclitaxel x12  (3) genetics testing 11/18 pending  (4) definitive surgery to follow  (5) adjuvant radiation  PLAN: MSydniis doing well today.  She is only slightly neutropenic.  She tolerated chemotherapy well.  She and I reviewed her labs. I did a thorough breast exam to evaluate her pain.  Her breast and axilla continue to improve which is good news.    MMikylahwas recommended to take Valtrex daily.    MZailawill return in one week for labs, f/u and  her last doxorubicin and cyclophosphamide.  She knows to call for any other issues that may develop before that visit.  A total of (20) minutes of face-to-face time was spent with this patient with greater than 50% of that time in counseling and care-coordination.   LWilber Bihari NP  05/12/18 2:21 PM Medical Oncology and Hematology [facility]AH[place] [address]Tel. [phone]   Fax. [phone]

## 2018-05-13 ENCOUNTER — Telehealth: Payer: Self-pay | Admitting: Adult Health

## 2018-05-13 ENCOUNTER — Telehealth: Payer: Self-pay | Admitting: Oncology

## 2018-05-13 NOTE — Telephone Encounter (Signed)
Updated December schedule mailed.

## 2018-05-13 NOTE — Telephone Encounter (Signed)
[place] out 12/19 - moved f/u to [place]. [facility] patient LC out - [facility] overbooked.

## 2018-05-13 NOTE — Telephone Encounter (Signed)
Per 12/12 no los °

## 2018-05-19 ENCOUNTER — Other Ambulatory Visit: Payer: BC Managed Care – PPO

## 2018-05-19 ENCOUNTER — Inpatient Hospital Stay (HOSPITAL_BASED_OUTPATIENT_CLINIC_OR_DEPARTMENT_OTHER): Payer: BC Managed Care – PPO | Admitting: Medical

## 2018-05-19 ENCOUNTER — Encounter: Payer: Self-pay | Admitting: *Deleted

## 2018-05-19 ENCOUNTER — Inpatient Hospital Stay: Payer: BC Managed Care – PPO

## 2018-05-19 ENCOUNTER — Ambulatory Visit: Payer: BC Managed Care – PPO

## 2018-05-19 VITALS — BP 138/84 | HR 87 | Temp 98.4°F | Resp 17 | Ht 69.0 in | Wt 213.0 lb

## 2018-05-19 DIAGNOSIS — Z808 Family history of malignant neoplasm of other organs or systems: Secondary | ICD-10-CM

## 2018-05-19 DIAGNOSIS — Z801 Family history of malignant neoplasm of trachea, bronchus and lung: Secondary | ICD-10-CM

## 2018-05-19 DIAGNOSIS — Z171 Estrogen receptor negative status [ER-]: Principal | ICD-10-CM

## 2018-05-19 DIAGNOSIS — R5383 Other fatigue: Secondary | ICD-10-CM

## 2018-05-19 DIAGNOSIS — C50412 Malignant neoplasm of upper-outer quadrant of left female breast: Secondary | ICD-10-CM

## 2018-05-19 DIAGNOSIS — Z95828 Presence of other vascular implants and grafts: Secondary | ICD-10-CM

## 2018-05-19 DIAGNOSIS — Z9711 Presence of artificial right arm (complete) (partial): Secondary | ICD-10-CM

## 2018-05-19 DIAGNOSIS — Z90710 Acquired absence of both cervix and uterus: Secondary | ICD-10-CM

## 2018-05-19 DIAGNOSIS — Z79899 Other long term (current) drug therapy: Secondary | ICD-10-CM

## 2018-05-19 DIAGNOSIS — K219 Gastro-esophageal reflux disease without esophagitis: Secondary | ICD-10-CM

## 2018-05-19 LAB — COMPREHENSIVE METABOLIC PANEL
ALT: 9 U/L (ref 0–44)
AST: 11 U/L — ABNORMAL LOW (ref 15–41)
Albumin: 3.6 g/dL (ref 3.5–5.0)
Alkaline Phosphatase: 124 U/L (ref 38–126)
Anion gap: 9 (ref 5–15)
BUN: 6 mg/dL (ref 6–20)
CO2: 25 mmol/L (ref 22–32)
CREATININE: 0.68 mg/dL (ref 0.44–1.00)
Calcium: 9 mg/dL (ref 8.9–10.3)
Chloride: 109 mmol/L (ref 98–111)
GFR calc Af Amer: >60 mL/min (ref >60)
GFR calc non Af Amer: >60 mL/min (ref >60)
Glucose, Bld: 112 mg/dL — ABNORMAL HIGH (ref 70–99)
Potassium: 3.9 mmol/L (ref 3.5–5.1)
SODIUM: 143 mmol/L (ref 135–145)
Total Bilirubin: 0.4 mg/dL (ref 0.3–1.2)
Total Protein: 6.6 g/dL (ref 6.5–8.1)

## 2018-05-19 LAB — CBC WITH DIFFERENTIAL/PLATELET
Abs Immature Granulocytes: 0.62 10*3/uL — ABNORMAL HIGH (ref 0.00–0.07)
BASOS ABS: 0 10*3/uL (ref 0.0–0.1)
Basophils Relative: 0 %
EOS ABS: 0 10*3/uL (ref 0.0–0.5)
EOS PCT: 0 %
HCT: 32.8 % — ABNORMAL LOW (ref 36.0–46.0)
Hemoglobin: 10.4 g/dL — ABNORMAL LOW (ref 12.0–15.0)
Immature Granulocytes: 5 %
Lymphocytes Relative: 8 %
Lymphs Abs: 1 10*3/uL (ref 0.7–4.0)
MCH: 30.4 pg (ref 26.0–34.0)
MCHC: 31.7 g/dL (ref 30.0–36.0)
MCV: 95.9 fL (ref 80.0–100.0)
Monocytes Absolute: 1.4 10*3/uL — ABNORMAL HIGH (ref 0.1–1.0)
Monocytes Relative: 12 %
NRBC: 0.7 % — AB (ref 0.0–0.2)
Neutro Abs: 8.6 10*3/uL — ABNORMAL HIGH (ref 1.7–7.7)
Neutrophils Relative %: 75 %
Platelets: 195 10*3/uL (ref 150–400)
RBC: 3.42 MIL/uL — ABNORMAL LOW (ref 3.87–5.11)
RDW: 14 % (ref 11.5–15.5)
WBC: 11.6 10*3/uL — ABNORMAL HIGH (ref 4.0–10.5)

## 2018-05-19 MED ORDER — SODIUM CHLORIDE 0.9 % IV SOLN
Freq: Once | INTRAVENOUS | Status: AC
  Administered 2018-05-19: 12:00:00 via INTRAVENOUS
  Filled 2018-05-19: qty 5

## 2018-05-19 MED ORDER — PALONOSETRON HCL INJECTION 0.25 MG/5ML
0.2500 mg | Freq: Once | INTRAVENOUS | Status: AC
  Administered 2018-05-19: 0.25 mg via INTRAVENOUS

## 2018-05-19 MED ORDER — SODIUM CHLORIDE 0.9% FLUSH
10.0000 mL | INTRAVENOUS | Status: DC | PRN
  Administered 2018-05-19: 10 mL
  Filled 2018-05-19: qty 10

## 2018-05-19 MED ORDER — SODIUM CHLORIDE 0.9 % IV SOLN
Freq: Once | INTRAVENOUS | Status: AC
  Administered 2018-05-19: 12:00:00 via INTRAVENOUS
  Filled 2018-05-19: qty 250

## 2018-05-19 MED ORDER — SODIUM CHLORIDE 0.9 % IV SOLN
600.0000 mg/m2 | Freq: Once | INTRAVENOUS | Status: AC
  Administered 2018-05-19: 1300 mg via INTRAVENOUS
  Filled 2018-05-19: qty 65

## 2018-05-19 MED ORDER — SODIUM CHLORIDE 0.9% FLUSH
10.0000 mL | Freq: Once | INTRAVENOUS | Status: AC
  Administered 2018-05-19: 10 mL
  Filled 2018-05-19: qty 10

## 2018-05-19 MED ORDER — DOXORUBICIN HCL CHEMO IV INJECTION 2 MG/ML
60.0000 mg/m2 | Freq: Once | INTRAVENOUS | Status: AC
  Administered 2018-05-19: 130 mg via INTRAVENOUS
  Filled 2018-05-19: qty 65

## 2018-05-19 MED ORDER — PALONOSETRON HCL INJECTION 0.25 MG/5ML
INTRAVENOUS | Status: AC
  Filled 2018-05-19: qty 5

## 2018-05-19 MED ORDER — HEPARIN SOD (PORK) LOCK FLUSH 100 UNIT/ML IV SOLN
500.0000 [IU] | Freq: Once | INTRAVENOUS | Status: AC | PRN
  Administered 2018-05-19: 500 [IU]
  Filled 2018-05-19: qty 5

## 2018-05-19 NOTE — Patient Instructions (Signed)
[facility] Discharge Instructions for Patients Receiving Chemotherapy  Today you received the following chemotherapy agents: Adriamycin, Cytoxan  To help prevent nausea and vomiting after your treatment, we encourage you to take your nausea medication as directed.   If you develop nausea and vomiting that is not controlled by your nausea medication, call the clinic.   BELOW ARE SYMPTOMS THAT SHOULD BE REPORTED IMMEDIATELY:  *FEVER GREATER THAN 100.5 F  *CHILLS WITH OR WITHOUT FEVER  NAUSEA AND VOMITING THAT IS NOT CONTROLLED WITH YOUR NAUSEA MEDICATION  *UNUSUAL SHORTNESS OF BREATH  *UNUSUAL BRUISING OR BLEEDING  TENDERNESS IN MOUTH AND THROAT WITH OR WITHOUT PRESENCE OF ULCERS  *URINARY PROBLEMS  *BOWEL PROBLEMS  UNUSUAL RASH Items with * indicate a potential emergency and should be followed up as soon as possible.  Feel free to call the clinic should you have any questions or concerns. The clinic phone number is [phone].  Please show the CHEMO ALERT CARD at check-in to the Emergency Department and triage nurse.   

## 2018-05-21 ENCOUNTER — Inpatient Hospital Stay: Payer: BC Managed Care – PPO

## 2018-05-21 VITALS — BP 130/82 | HR 67 | Temp 98.2°F | Resp 18 | Ht 69.0 in

## 2018-05-21 DIAGNOSIS — Z171 Estrogen receptor negative status [ER-]: Principal | ICD-10-CM

## 2018-05-21 DIAGNOSIS — C50412 Malignant neoplasm of upper-outer quadrant of left female breast: Secondary | ICD-10-CM

## 2018-05-21 MED ORDER — PEGFILGRASTIM-CBQV 6 MG/0.6ML ~~LOC~~ SOSY
6.0000 mg | PREFILLED_SYRINGE | Freq: Once | SUBCUTANEOUS | Status: AC
  Administered 2018-05-21: 6 mg via SUBCUTANEOUS

## 2018-05-21 MED ORDER — PEGFILGRASTIM-CBQV 6 MG/0.6ML ~~LOC~~ SOSY
PREFILLED_SYRINGE | SUBCUTANEOUS | Status: AC
  Filled 2018-05-21: qty 0.6

## 2018-05-21 NOTE — Patient Instructions (Signed)
Pegfilgrastim injection  What is this medicine?  PEGFILGRASTIM (PEG fil gra stim) is a long-acting granulocyte colony-stimulating factor that stimulates the growth of neutrophils, a type of white blood cell important in the body's fight against infection. It is used to reduce the incidence of fever and infection in patients with certain types of cancer who are receiving chemotherapy that affects the bone marrow, and to increase survival after being exposed to high doses of radiation.  This medicine may be used for other purposes; ask your health care provider or pharmacist if you have questions.  COMMON BRAND NAME(S): Fulphila, Neulasta, UDENYCA  What should I tell my health care provider before I take this medicine?  They need to know if you have any of these conditions:  -kidney disease  -latex allergy  -ongoing radiation therapy  -sickle cell disease  -skin reactions to acrylic adhesives (On-Body Injector only)  -an unusual or allergic reaction to pegfilgrastim, filgrastim, other medicines, foods, dyes, or preservatives  -pregnant or trying to get pregnant  -breast-feeding  How should I use this medicine?  This medicine is for injection under the skin. If you get this medicine at home, you will be taught how to prepare and give the pre-filled syringe or how to use the On-body Injector. Refer to the patient Instructions for Use for detailed instructions. Use exactly as directed. Tell your healthcare provider immediately if you suspect that the On-body Injector may not have performed as intended or if you suspect the use of the On-body Injector resulted in a missed or partial dose.  It is important that you put your used needles and syringes in a special sharps container. Do not put them in a trash can. If you do not have a sharps container, call your pharmacist or healthcare provider to get one.  Talk to your pediatrician regarding the use of this medicine in children. While this drug may be prescribed for  selected conditions, precautions do apply.  Overdosage: If you think you have taken too much of this medicine contact a poison control center or emergency room at once.  NOTE: This medicine is only for you. Do not share this medicine with others.  What if I miss a dose?  It is important not to miss your dose. Call your doctor or health care professional if you miss your dose. If you miss a dose due to an On-body Injector failure or leakage, a new dose should be administered as soon as possible using a single prefilled syringe for manual use.  What may interact with this medicine?  Interactions have not been studied.  Give your health care provider a list of all the medicines, herbs, non-prescription drugs, or dietary supplements you use. Also tell them if you smoke, drink alcohol, or use illegal drugs. Some items may interact with your medicine.  This list may not describe all possible interactions. Give your health care provider a list of all the medicines, herbs, non-prescription drugs, or dietary supplements you use. Also tell them if you smoke, drink alcohol, or use illegal drugs. Some items may interact with your medicine.  What should I watch for while using this medicine?  You may need blood work done while you are taking this medicine.  If you are going to need a MRI, CT scan, or other procedure, tell your doctor that you are using this medicine (On-Body Injector only).  What side effects may I notice from receiving this medicine?  Side effects that you should report to   your doctor or health care professional as soon as possible:  -allergic reactions like skin rash, itching or hives, swelling of the face, lips, or tongue  -back pain  -dizziness  -fever  -pain, redness, or irritation at site where injected  -pinpoint red spots on the skin  -red or dark-brown urine  -shortness of breath or breathing problems  -stomach or side pain, or pain at the shoulder  -swelling  -tiredness  -trouble passing urine or  change in the amount of urine  Side effects that usually do not require medical attention (report to your doctor or health care professional if they continue or are bothersome):  -bone pain  -muscle pain  This list may not describe all possible side effects. Call your doctor for medical advice about side effects. You may report side effects to FDA at 1-800-FDA-1088.  Where should I keep my medicine?  Keep out of the reach of children.  If you are using this medicine at home, you will be instructed on how to store it. Throw away any unused medicine after the expiration date on the label.  NOTE: This sheet is a summary. It may not cover all possible information. If you have questions about this medicine, talk to your doctor, pharmacist, or health care provider.   2019 Elsevier/Gold Standard (2017-08-23 16:57:08)

## 2018-05-23 NOTE — Progress Notes (Signed)
[facility]  [phone] [phone]     ID: Paula Berry DOB: 08/10/1970  MR#: [id number]  CSN#:[id number]  Patient Care Team: Wong, Francis P, MD as PCP - General (Family Medicine) Wakefield, Matthew, MD as Consulting Physician (General Surgery) Magrinat, Gustav C, MD as Consulting Physician (Oncology) Squire, Sarah, MD as Attending Physician (Radiation Oncology) Tomblin, James, MD as Consulting Physician (Obstetrics and Gynecology) Van E Tanner, PA-C OTHER MD:  CHIEF COMPLAINT: Triple negative breast cancer  CURRENT TREATMENT: Neoadjuvant chemotherapy   HISTORY OF CURRENT ILLNESS: From the original intake note:  The patient had a left [facility] with tomography and ultrasonography [facility] screening recall for a possible left breast mass.  This found the breast density to be category C.  There was a small lobulated mass in the lower outer aspect of the left breast, which was not palpable.  Ultrasound showed a small lobulated cyst in that area measuring 0.6 cm.  This was felt to be benign.  In July 2019 she had screening mammography at Dr. Tomlin's office.  I do not have that report but according to the patient it was unremarkable.  In October 2019 however the patient developed left axillary swelling and tenderness and was again set up for left mammography with tomography and left breast [facility], 03/11/2018.  Breast density was category C.  Mammography showed numerous markedly enlarged left axillary lymph nodes.  There was periareolar skin thickening.  There was an area of ill-defined distortion in the upper outer left breast and on physical exam there was a firm lump superior and lateral to the left nipple, with swelling of the left axilla.  Ultrasound of the left breast and both upper quadrants found an ill-defined irregular hypoechoic area measuring at least 4.2 cm.  There also  multiple scattered cysts.  Ultrasound of the left axilla showed at least 6 abnormal lymph nodes.  On 03/16/2018 the patient underwent right mammography with tomography and [facility].  Again breast density was category C.  There were multiple circumscribed equal density masses in the upper outer quadrant of the right breast which appeared stable.  Ultrasound showed diffuse fibrocystic changes.  An additional hypoechoic mass was noted at the 9 o'clock position 3 cm from the nipple measuring 0.8 cm, with no associated vascularity.  A complex solid and cystic mass was noted superficially at the 6 o'clock position 3 cm from the nipple measuring 1.6 cm.  Evaluation of the right axilla was negative.  On 03/18/2018 the patient underwent biopsy of the 2 suspicious areas in the right breast, and they both showed only fibrocystic changes with some chronic inflammation ([id number]).  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  The patient's subsequent history is as detailed below.  INTERVAL HISTORY: Paula Berry presents for cycle 4, day 1 of Adriamycin and Cytoxan which is dosed under the direction of Dr. Magrinat for her triple negative breast cancer.  She request a refill of Compazine.  She states that she continues to have some fatigue but is doing well overall with no acute issues of concern.  REVIEW OF SYSTEMS: Review of Systems  Constitutional: Positive for fatigue. Negative for appetite change, chills, diaphoresis, fever and unexpected weight   change.  HENT:   Negative for trouble swallowing.   Respiratory: Negative for cough and shortness of breath.   Cardiovascular: Negative for chest pain and palpitations.  Gastrointestinal: Negative for  abdominal distention, abdominal pain, blood in stool, constipation, diarrhea, nausea and vomiting.  Genitourinary: Negative for difficulty urinating, dysuria and frequency.     PAST MEDICAL HISTORY: Past Medical History:  Diagnosis Date  . Anemia    had iron infusion 08/12/15  . Asthma   . Cancer (HCC)    Breast cancer   . Genital herpes   . GERD (gastroesophageal reflux disease)   . Headache    migraines 2-3 times monthly  . Pneumonia    hospitalizied for pneumonia as a child    PAST SURGICAL HISTORY: Past Surgical History:  Procedure Laterality Date  . ABDOMINAL HYSTERECTOMY Bilateral 08/22/2015   Procedure: HYSTERECTOMY ABDOMINAL, BILATERAL SALPINGECTOMY;  Surgeon: James Tomblin, MD;  Location: [facility];  Service: Gynecology;  Laterality: Bilateral;  . MYOMECTOMY ABDOMINAL APPROACH    . PORTACATH PLACEMENT N/A 03/30/2018   Procedure: INSERTION PORT-A-CATH WITH US;  Surgeon: Wakefield, Matthew, MD;  Location: [facility];  Service: General;  Laterality: N/A;    FAMILY HISTORY Family History  Problem Relation Age of Onset  . Hypertension Unknown   . Diabetes Cousin        mat first cousin  . Cancer Unknown   . Dementia Maternal Grandfather   . Lung cancer Paternal Grandmother        d. 56-60  . Brain cancer Paternal Aunt        d. 48, father's mat 1/2 sister  . Diabetes Maternal Grandmother   . Other Paternal Grandfather        d. WWII  . Aneurysm Maternal Uncle        brain  . Migraines Neg Hx   The patient's parents are in their early 60s as of October 2019.  A paternal aunt had brain cancer at age 48.  A paternal grandmother had lung cancer at age 64.  There is no breast or ovarian cancer history in the family.  However note that the patient has little information on her mother side of the family.  GYNECOLOGIC HISTORY:  No LMP recorded. Patient has had a hysterectomy. Menarche: 47 years old GX P 0 LMP 2017  Contraceptive between ages 17 and 22, without  complications HRT  Hysterectomy 08/22/2015, uterus and fallopian tubes, benign; ovaries in place No oophorectomy  SOCIAL HISTORY:  Paula Berry works as a psychotherapist.  She is independent and "owns a group".  Her husband Ronnie S. Wright, Jr. works for the [employer].  He is a former patient here, with stage IV colon cancer.  They live alone, with no pets.     ADVANCED DIRECTIVES: Not in place   HEALTH MAINTENANCE: Social History   Tobacco Use  . Smoking status: Never Smoker  . Smokeless tobacco: Never Used  Substance Use Topics  . Alcohol use: Yes    Alcohol/week: 1.0 standard drinks    Types: 1 Glasses of wine per week    Comment: daily  . Drug use: No     Colonoscopy: Never  PAP: Status post hysterectomy  Bone density: Never   Allergies  Allergen Reactions  . Neosporin [Neomycin-Bacitracin Zn-Polymyx] Other (See Comments)    Decreased wound healing with blisters    Current Outpatient Medications  Medication Sig Dispense Refill  . acetaminophen (TYLENOL) 500 MG tablet Take 1,000 mg by mouth every   8 (eight) hours as needed for moderate pain.    . acyclovir ointment (ZOVIRAX) 5 % Apply 1 application topically 4 (four) times daily as needed (outbreaks).   2  . albuterol (PROVENTIL HFA;VENTOLIN HFA) 108 (90 Base) MCG/ACT inhaler Inhale 2 puffs into the lungs every 6 (six) hours as needed for wheezing or shortness of breath.     . ciprofloxacin (CIPRO) 500 MG tablet Take 1 tablet (500 mg total) by mouth 2 (two) times daily. 14 tablet 0  . dexamethasone (DECADRON) 4 MG tablet Take 2 tablets by mouth once a day on the day after chemotherapy and then take 2 tablets two times a day for 2 days. Take with food. 30 tablet 1  . lidocaine-prilocaine (EMLA) cream Apply to affected area once 30 g 3  . LORazepam (ATIVAN) 0.5 MG tablet Take 1 tablet (0.5 mg total) by mouth at bedtime as needed (Nausea or vomiting). 30 tablet 0  . methocarbamol (ROBAXIN) 500 MG tablet Take 500 mg  by mouth 3 (three) times daily as needed for muscle spasms (pain).    . montelukast (SINGULAIR) 10 MG tablet Take 10 mg by mouth every evening.    . oxyCODONE (OXY IR/ROXICODONE) 5 MG immediate release tablet Take 1 tablet (5 mg total) by mouth every 6 (six) hours as needed for moderate pain, severe pain or breakthrough pain. 8 tablet 0  . pantoprazole (PROTONIX) 40 MG tablet Take 40 mg by mouth every evening.    . prochlorperazine (COMPAZINE) 10 MG tablet Take 1 tablet (10 mg total) by mouth every 6 (six) hours as needed (Nausea or vomiting). 30 tablet 1  . SYMBICORT 160-4.5 MCG/ACT inhaler INL 2 PFS PO BID  3  . valACYclovir (VALTREX) 500 MG tablet Take 500 mg by mouth See admin instructions. Take 500 mg twice daily for 3 days at onset of outbreak then take 500 mg once daily until clear     No current facility-administered medications for this visit.     OBJECTIVE:  Vitals:   05/19/18 1020  BP: 138/84  Pulse: 87  Resp: 17  Temp: 98.4 F (36.9 C)  SpO2: 100%     Body mass index is 31.45 kg/m.   Wt Readings from Last 3 Encounters:  05/19/18 213 lb (96.6 kg)  05/12/18 210 lb 14.4 oz (95.7 kg)  05/05/18 212 lb 11.2 oz (96.5 kg)  ECOG FS:1  Physical Exam Constitutional:      General: She is not in acute distress.    Appearance: She is not diaphoretic.  HENT:     Head: Normocephalic and atraumatic.  Cardiovascular:     Rate and Rhythm: Normal rate and regular rhythm.     Heart sounds: Normal heart sounds. No murmur. No friction rub. No gallop.   Pulmonary:     Effort: Pulmonary effort is normal. No respiratory distress.     Breath sounds: Normal breath sounds. No stridor. No wheezing or rales.  Abdominal:     General: Bowel sounds are normal. There is no distension.     Palpations: Abdomen is soft. There is no mass.     Tenderness: There is no abdominal tenderness. There is no guarding or rebound.  Skin:    General: Skin is warm and dry.  Neurological:     General: No focal  deficit present.     Mental Status: She is alert.     Coordination: Coordination normal.  Psychiatric:        Mood and Affect: Mood   normal.        Behavior: Behavior normal.        Thought Content: Thought content normal.        Judgment: Judgment normal.      LAB RESULTS:  CMP     Component Value Date/Time   NA 143 05/19/2018 0957   K 3.9 05/19/2018 0957   CL 109 05/19/2018 0957   CO2 25 05/19/2018 0957   GLUCOSE 112 (H) 05/19/2018 0957   BUN 6 05/19/2018 0957   CREATININE 0.68 05/19/2018 0957   CREATININE 0.67 03/23/2018 0855   CALCIUM 9.0 05/19/2018 0957   PROT 6.6 05/19/2018 0957   ALBUMIN 3.6 05/19/2018 0957   AST 11 (L) 05/19/2018 0957   AST 15 03/23/2018 0855   ALT 9 05/19/2018 0957   ALT 17 03/23/2018 0855   ALKPHOS 124 05/19/2018 0957   BILITOT 0.4 05/19/2018 0957   BILITOT 1.0 03/23/2018 0855   GFRNONAA >60 05/19/2018 0957   GFRNONAA >60 03/23/2018 0855   GFRAA >60 05/19/2018 0957   GFRAA >60 03/23/2018 0855    No results found for: TOTALPROTELP, ALBUMINELP, A1GS, A2GS, BETS, BETA2SER, GAMS, MSPIKE, SPEI  No results found for: Nils Pyle, KAPLAMBRATIO  Lab Results  Component Value Date   WBC 11.6 (H) 05/19/2018   NEUTROABS 8.6 (H) 05/19/2018   HGB 10.4 (L) 05/19/2018   HCT 32.8 (L) 05/19/2018   MCV 95.9 05/19/2018   PLT 195 05/19/2018      Chemistry      Component Value Date/Time   NA 143 05/19/2018 0957   K 3.9 05/19/2018 0957   CL 109 05/19/2018 0957   CO2 25 05/19/2018 0957   BUN 6 05/19/2018 0957   CREATININE 0.68 05/19/2018 0957   CREATININE 0.67 03/23/2018 0855      Component Value Date/Time   CALCIUM 9.0 05/19/2018 0957   ALKPHOS 124 05/19/2018 0957   AST 11 (L) 05/19/2018 0957   AST 15 03/23/2018 0855   ALT 9 05/19/2018 0957   ALT 17 03/23/2018 0855   BILITOT 0.4 05/19/2018 0957   BILITOT 1.0 03/23/2018 0855       No results found for: LABCA2  No components found for: [id number]  No results for input(s):  INR in the last 168 hours.  No results found for: LABCA2  No results found for: CAN199  No results found for: WUJ811  No results found for: BJY782  Lab Results  Component Value Date   CA2729 213.7 (H) 04/07/2018    No components found for: HGQUANT  No results found for: CEA1 / No results found for: CEA1   No results found for: AFPTUMOR  No results found for: [place]  No results found for: PSA1  Appointment on 05/19/2018  Component Date Value Ref Range Status  . Sodium 05/19/2018 143  135 - 145 mmol/L Final  . Potassium 05/19/2018 3.9  3.5 - 5.1 mmol/L Final  . Chloride 05/19/2018 109  98 - 111 mmol/L Final  . CO2 05/19/2018 25  22 - 32 mmol/L Final  . Glucose, Bld 05/19/2018 112* 70 - 99 mg/dL Final  . BUN 05/19/2018 6  6 - 20 mg/dL Final  . Creatinine, Ser 05/19/2018 0.68  0.44 - 1.00 mg/dL Final  . Calcium 05/19/2018 9.0  8.9 - 10.3 mg/dL Final  . Total Protein 05/19/2018 6.6  6.5 - 8.1 g/dL Final  . Albumin 05/19/2018 3.6  3.5 - 5.0 g/dL Final  . AST 05/19/2018 11* 15 - 41 U/L Final  .  ALT 05/19/2018 9  0 - 44 U/L Final  . Alkaline Phosphatase 05/19/2018 124  38 - 126 U/L Final  . Total Bilirubin 05/19/2018 0.4  0.3 - 1.2 mg/dL Final  . GFR calc non Af Amer 05/19/2018 >60  >60 mL/min Final  . GFR calc Af Amer 05/19/2018 >60  >60 mL/min Final  . Anion gap 05/19/2018 9  5 - 15 Final   Performed at [facility], [address] [place], [place] [address]  . WBC 05/19/2018 11.6* 4.0 - 10.5 K/uL Final  . RBC 05/19/2018 3.42* 3.87 - 5.11 MIL/uL Final  . Hemoglobin 05/19/2018 10.4* 12.0 - 15.0 g/dL Final  . HCT 05/19/2018 32.8* 36.0 - 46.0 % Final  . MCV 05/19/2018 95.9  80.0 - 100.0 fL Final  . MCH 05/19/2018 30.4  26.0 - 34.0 pg Final  . MCHC 05/19/2018 31.7  30.0 - 36.0 g/dL Final  . RDW 05/19/2018 14.0  11.5 - 15.5 % Final  . Platelets 05/19/2018 195  150 - 400 K/uL Final  . nRBC 05/19/2018 0.7* 0.0 - 0.2 % Final  . Neutrophils  Relative % 05/19/2018 75  % Final  . Neutro Abs 05/19/2018 8.6* 1.7 - 7.7 K/uL Final  . Lymphocytes Relative 05/19/2018 8  % Final  . Lymphs Abs 05/19/2018 1.0  0.7 - 4.0 K/uL Final  . Monocytes Relative 05/19/2018 12  % Final  . Monocytes Absolute 05/19/2018 1.4* 0.1 - 1.0 K/uL Final  . Eosinophils Relative 05/19/2018 0  % Final  . Eosinophils Absolute 05/19/2018 0.0  0.0 - 0.5 K/uL Final  . Basophils Relative 05/19/2018 0  % Final  . Basophils Absolute 05/19/2018 0.0  0.0 - 0.1 K/uL Final  . Immature Granulocytes 05/19/2018 5  % Final   Increased IG's, likely caused by Bone Marrow Colony Stimulating Factor received within 30 days.  . Abs Immature Granulocytes 05/19/2018 0.62* 0.00 - 0.07 K/uL Final   Performed at [facility], [address] [place], [place] [address]    (this displays the last labs from the last 3 days)  No results found for: TOTALPROTELP, ALBUMINELP, A1GS, A2GS, BETS, BETA2SER, GAMS, MSPIKE, SPEI (this displays SPEP labs)  No results found for: KPAFRELGTCHN, LAMBDASER, KAPLAMBRATIO (kappa/lambda light chains)  No results found for: HGBA, HGBA2QUANT, HGBFQUANT, HGBSQUAN (Hemoglobinopathy evaluation)   No results found for: LDH  No results found for: IRON, TIBC, IRONPCTSAT (Iron and TIBC)  No results found for: FERRITIN  Urinalysis No results found for: COLORURINE, APPEARANCEUR, LABSPEC, PHURINE, GLUCOSEU, HGBUR, BILIRUBINUR, KETONESUR, PROTEINUR, UROBILINOGEN, NITRITE, LEUKOCYTESUR   STUDIES: No results found.   ELIGIBLE FOR AVAILABLE RESEARCH PROTOCOL:  May eventually qualify for BR004; UPBEAT  ASSESSMENT: 47 y.o. [place] woman status post left breast overlapping site biopsy and left axillary lymph node biopsy 03/16/2018 for a clinical T2 N3, stage IIIC invasive ductal carcinoma, triple negative, with an MIB-1 of 15%   (1) Staging studies  (a) CT chest on 04/05/2018: left axillary and chronic left retropectoral adenopathy,  supraclavicular adenopathy, left breast skin thickening and mass, 3mm left upper lobe pulmonary nodule (repeat ct chest in 3-6 months recommended), bronchiectasis in left lower lobe.  (b) CT neck on 04/05/2018: left supraclavicular adenopathy, ? Abnormality in left frontal lobe of brain  (c) Bone scan on 04/05/2018: no findings suggestive of metastatic disease to bone  (d) MRI brain 04/07/2018: negative  (2) neoadjuvant chemotherapy to consist of doxorubicin and cyclophosphamide in dose dense fashion x4 followed by   weekly carboplatin and paclitaxel x12  (3) genetics testing 11/18 pending  (4) definitive surgery to follow  (5) adjuvant radiation  PLAN: Paula Berry is doing well today.   We will proceed with cycle 4, day 1 of Adriamycin and Cytoxan today.  She is scheduled to return on 05/27/2018 for follow-up.  Sandi Mealy, MHS, PA-C Medical Oncology and Hematology [facility] [address] [place], [place] [address] Tel. [phone]    Fax. [phone]

## 2018-05-27 ENCOUNTER — Encounter: Payer: Self-pay | Admitting: *Deleted

## 2018-05-27 ENCOUNTER — Inpatient Hospital Stay: Payer: BC Managed Care – PPO

## 2018-05-27 ENCOUNTER — Encounter: Payer: Self-pay | Admitting: Adult Health

## 2018-05-27 ENCOUNTER — Ambulatory Visit: Payer: BC Managed Care – PPO

## 2018-05-27 ENCOUNTER — Inpatient Hospital Stay (HOSPITAL_BASED_OUTPATIENT_CLINIC_OR_DEPARTMENT_OTHER): Payer: BC Managed Care – PPO | Admitting: Adult Health

## 2018-05-27 VITALS — BP 113/70 | HR 107 | Temp 98.3°F | Resp 18 | Ht 69.0 in | Wt 202.4 lb

## 2018-05-27 DIAGNOSIS — Z171 Estrogen receptor negative status [ER-]: Principal | ICD-10-CM

## 2018-05-27 DIAGNOSIS — R5381 Other malaise: Secondary | ICD-10-CM

## 2018-05-27 DIAGNOSIS — C50412 Malignant neoplasm of upper-outer quadrant of left female breast: Secondary | ICD-10-CM

## 2018-05-27 DIAGNOSIS — R0982 Postnasal drip: Secondary | ICD-10-CM

## 2018-05-27 DIAGNOSIS — R232 Flushing: Secondary | ICD-10-CM

## 2018-05-27 DIAGNOSIS — Z95828 Presence of other vascular implants and grafts: Secondary | ICD-10-CM

## 2018-05-27 DIAGNOSIS — Z79899 Other long term (current) drug therapy: Secondary | ICD-10-CM

## 2018-05-27 DIAGNOSIS — R911 Solitary pulmonary nodule: Secondary | ICD-10-CM

## 2018-05-27 DIAGNOSIS — Z808 Family history of malignant neoplasm of other organs or systems: Secondary | ICD-10-CM

## 2018-05-27 DIAGNOSIS — K219 Gastro-esophageal reflux disease without esophagitis: Secondary | ICD-10-CM

## 2018-05-27 DIAGNOSIS — R630 Anorexia: Secondary | ICD-10-CM

## 2018-05-27 DIAGNOSIS — R197 Diarrhea, unspecified: Secondary | ICD-10-CM

## 2018-05-27 DIAGNOSIS — Z90710 Acquired absence of both cervix and uterus: Secondary | ICD-10-CM

## 2018-05-27 DIAGNOSIS — R5383 Other fatigue: Secondary | ICD-10-CM

## 2018-05-27 DIAGNOSIS — Z801 Family history of malignant neoplasm of trachea, bronchus and lung: Secondary | ICD-10-CM

## 2018-05-27 LAB — CBC WITH DIFFERENTIAL/PLATELET
Abs Immature Granulocytes: 0.08 10*3/uL — ABNORMAL HIGH (ref 0.00–0.07)
Basophils Absolute: 0 10*3/uL (ref 0.0–0.1)
Basophils Relative: 1 %
Eosinophils Absolute: 0 10*3/uL (ref 0.0–0.5)
Eosinophils Relative: 0 %
HCT: 32.6 % — ABNORMAL LOW (ref 36.0–46.0)
Hemoglobin: 10.5 g/dL — ABNORMAL LOW (ref 12.0–15.0)
Immature Granulocytes: 3 %
LYMPHS ABS: 0.8 10*3/uL (ref 0.7–4.0)
Lymphocytes Relative: 27 %
MCH: 30.3 pg (ref 26.0–34.0)
MCHC: 32.2 g/dL (ref 30.0–36.0)
MCV: 93.9 fL (ref 80.0–100.0)
Monocytes Absolute: 0.7 10*3/uL (ref 0.1–1.0)
Monocytes Relative: 25 %
NEUTROS ABS: 1.3 10*3/uL — AB (ref 1.7–7.7)
Neutrophils Relative %: 44 %
Platelets: 112 10*3/uL — ABNORMAL LOW (ref 150–400)
RBC: 3.47 MIL/uL — ABNORMAL LOW (ref 3.87–5.11)
RDW: 14.1 % (ref 11.5–15.5)
WBC: 2.9 10*3/uL — ABNORMAL LOW (ref 4.0–10.5)
nRBC: 0 % (ref 0.0–0.2)

## 2018-05-27 LAB — COMPREHENSIVE METABOLIC PANEL
ALT: 13 U/L (ref 0–44)
AST: 9 U/L — ABNORMAL LOW (ref 15–41)
Albumin: 3.8 g/dL (ref 3.5–5.0)
Alkaline Phosphatase: 103 U/L (ref 38–126)
Anion gap: 8 (ref 5–15)
BUN: 8 mg/dL (ref 6–20)
CO2: 28 mmol/L (ref 22–32)
Calcium: 9 mg/dL (ref 8.9–10.3)
Chloride: 106 mmol/L (ref 98–111)
Creatinine, Ser: 0.69 mg/dL (ref 0.44–1.00)
GFR calc Af Amer: >60 mL/min (ref >60)
GFR calc non Af Amer: >60 mL/min (ref >60)
Glucose, Bld: 91 mg/dL (ref 70–99)
Potassium: 3.6 mmol/L (ref 3.5–5.1)
SODIUM: 142 mmol/L (ref 135–145)
Total Bilirubin: 0.9 mg/dL (ref 0.3–1.2)
Total Protein: 6.8 g/dL (ref 6.5–8.1)

## 2018-05-27 MED ORDER — SODIUM CHLORIDE 0.9 % IV SOLN
INTRAVENOUS | Status: DC
  Administered 2018-05-27: 14:00:00 via INTRAVENOUS
  Filled 2018-05-27 (×2): qty 250

## 2018-05-27 MED ORDER — HEPARIN SOD (PORK) LOCK FLUSH 100 UNIT/ML IV SOLN
500.0000 [IU] | Freq: Once | INTRAVENOUS | Status: AC
  Administered 2018-05-27: 500 [IU]
  Filled 2018-05-27: qty 5

## 2018-05-27 MED ORDER — SODIUM CHLORIDE 0.9% FLUSH
10.0000 mL | Freq: Once | INTRAVENOUS | Status: AC
  Administered 2018-05-27: 10 mL
  Filled 2018-05-27: qty 10

## 2018-05-27 MED ORDER — PROCHLORPERAZINE MALEATE 10 MG PO TABS: 10.0000 mg | ORAL_TABLET | Freq: Four times a day (QID) | ORAL | 1 refills | Status: DC | PRN

## 2018-05-27 NOTE — Progress Notes (Signed)
[place]  [phone] [phone]) [phone]     ID: Paula Berry DOB: 09/05/1970  MR#: [id number]  QIH#:[id number]  Patient Care Team: Vernie Shanks, MD as PCP - General (Family Medicine) Rolm Bookbinder, MD as Consulting Physician (General Surgery) Magrinat, Virgie Dad, MD as Consulting Physician (Oncology) Eppie Gibson, MD as Attending Physician (Radiation Oncology) Everlene Farrier, MD as Consulting Physician (Obstetrics and Gynecology) Scot Dock, NP OTHER MD:  CHIEF COMPLAINT: Triple negative breast cancer  CURRENT TREATMENT: Neoadjuvant chemotherapy   HISTORY OF CURRENT ILLNESS: From the original intake note:  The patient had a left mammogram with tomography and ultrasonography 07/12/2015 at the [facility] after screening recall for a possible left breast mass.  This found the breast density to be category C.  There was a small lobulated mass in the lower outer aspect of the left breast, which was not palpable.  Ultrasound showed a small lobulated cyst in that area measuring 0.6 cm.  This was felt to be benign.  In July 2019 she had screening mammography at Dr. Clementeen Hoof office.  I do not have that report but according to the patient it was unremarkable.  In October 2019 however the patient developed left axillary swelling and tenderness and was again set up for left mammography with tomography and left breast ultrasonography at the [facility], 03/11/2018.  Breast density was category C.  Mammography showed numerous markedly enlarged left axillary lymph nodes.  There was periareolar skin thickening.  There was an area of ill-defined distortion in the upper outer left breast and on physical exam there was a firm lump superior and lateral to the left nipple, with swelling of the left axilla.  Ultrasound of the left breast and both upper quadrants found an ill-defined irregular hypoechoic area measuring at least 4.2 cm.  There also  multiple scattered cysts.  Ultrasound of the left axilla showed at least 6 abnormal lymph nodes.  On 03/16/2018 the patient underwent right mammography with tomography and right breast ultrasonography at the [facility].  Again breast density was category C.  There were multiple circumscribed equal density masses in the upper outer quadrant of the right breast which appeared stable.  Ultrasound showed diffuse fibrocystic changes.  An additional hypoechoic mass was noted at the 9 o'clock position 3 cm from the nipple measuring 0.8 cm, with no associated vascularity.  A complex solid and cystic mass was noted superficially at the 6 o'clock position 3 cm from the nipple measuring 1.6 cm.  Evaluation of the right axilla was negative.  On 03/18/2018 the patient underwent biopsy of the 2 suspicious areas in the right breast, and they both showed only fibrocystic changes with some chronic inflammation ([id number]).  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  The patient's subsequent history is as detailed below.  INTERVAL HISTORY: Paula Berry returns today for follow-up of her triple negative breast cancer.   The patient continues on neoadjuvant chemotherapy with Doxorubicin and Cyclophosphamide She is on day 8 cycle 4 of 4 planned cycles, to be followed by weekly Paclitaxel and Carboplatin.  REVIEW OF SYSTEMS: Paula Berry is not feeling well today.  She has been increasingly nauseated with her fourth cycle and has been taking compazine more frequently. On Monday she started vomiting and had some  stomach pain and diarrhea a few times.  Since then she has had decreased appetite, difficulty with PO intake.  She has noted an increase in hot flashes and perspiration.  Increased post nasal drip,  and mucous drainage.  This is being treated by her pcp with singulair and protonix.  She has a NP cough.  She has not noted any fevers.  She has noted chills after her hot flashes.  She has also noted an increase in fatigue.  She had difficulty sleeping as well, and felt more restless.  She had a hard time feeling comfortable.  She notes an increased in eye tearing.    She denies any further diarrhea.  Her LBM was Monday.  She is without shortness of breath, chest pain, or palpitations.  A detailed ROS was otherwise non contributory.    PAST MEDICAL HISTORY: Past Medical History:  Diagnosis Date  . Anemia    had iron infusion 08/12/15  . Asthma   . Cancer [facility])    Breast cancer   . Genital herpes   . GERD (gastroesophageal reflux disease)   . Headache    migraines 2-3 times monthly  . Pneumonia    hospitalizied for pneumonia as a child    PAST SURGICAL HISTORY: Past Surgical History:  Procedure Laterality Date  . ABDOMINAL HYSTERECTOMY Bilateral 08/22/2015   Procedure: HYSTERECTOMY ABDOMINAL, BILATERAL SALPINGECTOMY;  Surgeon: Everlene Farrier, MD;  Location: [facility];  Service: Gynecology;  Laterality: Bilateral;  . MYOMECTOMY ABDOMINAL APPROACH    . PORTACATH PLACEMENT N/A 03/30/2018   Procedure: INSERTION PORT-A-CATH WITH [place];  Surgeon: Rolm Bookbinder, MD;  Location: [facility];  Service: General;  Laterality: N/A;    FAMILY HISTORY Family History  Problem Relation Age of Onset  . Hypertension Unknown   . Diabetes Cousin        mat first cousin  . Cancer Unknown   . Dementia Maternal Grandfather   . Lung cancer Paternal Grandmother        d. 42-60  . Brain cancer Paternal Aunt        d. 43, father's mat 1/2 sister  . Diabetes Maternal Grandmother   . Other Paternal Grandfather        d. WWII  . Aneurysm Maternal Uncle        brain  . Migraines Neg Hx   The patient's parents are in their early 25s as of October 2019.  A paternal aunt had brain cancer at age 61.  A paternal  grandmother had lung cancer at age 23.  There is no breast or ovarian cancer history in the family.  However note that the patient has little information on her mother side of the family.  GYNECOLOGIC HISTORY:  No LMP recorded. Patient has had a hysterectomy. 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  Her husband [name]. works for the [employer].  He is a former patient here, with stage IV colon cancer.  They live alone, with no pets.     ADVANCED DIRECTIVES: Not in place   HEALTH MAINTENANCE: Social History   Tobacco Use  . Smoking status: Never Smoker  . Smokeless tobacco: Never Used  Substance Use Topics  . Alcohol use: Yes  Alcohol/week: 1.0 standard drinks    Types: 1 Glasses of wine per week    Comment: daily  . Drug use: No     Colonoscopy: Never  PAP: Status post hysterectomy  Bone density: Never   Allergies  Allergen Reactions  . Neosporin [Neomycin-Bacitracin Zn-Polymyx] Other (See Comments)    Decreased wound healing with blisters    Current Outpatient Medications  Medication Sig Dispense Refill  . acetaminophen (TYLENOL) 500 MG tablet Take 1,000 mg by mouth every 8 (eight) hours as needed for moderate pain.    Marland Kitchen acyclovir ointment (ZOVIRAX) 5 % Apply 1 application topically 4 (four) times daily as needed (outbreaks).   2  . albuterol (PROVENTIL HFA;VENTOLIN HFA) 108 (90 Base) MCG/ACT inhaler Inhale 2 puffs into the lungs every 6 (six) hours as needed for wheezing or shortness of breath.     . ciprofloxacin (CIPRO) 500 MG tablet Take 1 tablet (500 mg total) by mouth 2 (two) times daily. 14 tablet 0  . dexamethasone (DECADRON) 4 MG tablet Take 2 tablets by mouth once a day on the day  after chemotherapy and then take 2 tablets two times a day for 2 days. Take with food. 30 tablet 1  . lidocaine-prilocaine (EMLA) cream Apply to affected area once 30 g 3  . LORazepam (ATIVAN) 0.5 MG tablet Take 1 tablet (0.5 mg total) by mouth at bedtime as needed (Nausea or vomiting). 30 tablet 0  . methocarbamol (ROBAXIN) 500 MG tablet Take 500 mg by mouth 3 (three) times daily as needed for muscle spasms (pain).    . montelukast (SINGULAIR) 10 MG tablet Take 10 mg by mouth every evening.    Marland Kitchen oxyCODONE (OXY IR/ROXICODONE) 5 MG immediate release tablet Take 1 tablet (5 mg total) by mouth every 6 (six) hours as needed for moderate pain, severe pain or breakthrough pain. 8 tablet 0  . pantoprazole (PROTONIX) 40 MG tablet Take 40 mg by mouth every evening.    . prochlorperazine (COMPAZINE) 10 MG tablet Take 1 tablet (10 mg total) by mouth every 6 (six) hours as needed (Nausea or vomiting). 30 tablet 1  . SYMBICORT 160-4.5 MCG/ACT inhaler INL 2 PFS PO BID  3  . valACYclovir (VALTREX) 500 MG tablet Take 500 mg by mouth See admin instructions. Take 500 mg twice daily for 3 days at onset of outbreak then take 500 mg once daily until clear     Current Facility-Administered Medications  Medication Dose Route Frequency Provider Last Rate Last Dose  . 0.9 %  sodium chloride infusion   Intravenous Continuous Gardenia Phlegm, NP        OBJECTIVE:  Vitals:   05/27/18 1305  BP: 113/70  Pulse: (!) 107  Resp: 18  Temp: 98.3 F (36.8 C)  SpO2: 100%     Body mass index is 29.89 kg/m.   Wt Readings from Last 3 Encounters:  05/27/18 202 lb 6.4 oz (91.8 kg)  05/19/18 213 lb (96.6 kg)  05/12/18 210 lb 14.4 oz (95.7 kg)  ECOG FS:1 GENERAL: Patient is a well appearing female in no acute distress HEENT:  Sclerae anicteric.  Oropharynx clear and moist. No ulcerations or evidence of oropharyngeal candidiasis. Neck is supple.  NODES:  No cervical, supraclavicular, or axillary lymphadenopathy  palpated.  BREAST EXAM:  Left axillary mass is smaller and softer, breast nodularity is softer as well  LUNGS:  Clear to auscultation bilaterally.  No wheezes or rhonchi. HEART:  Regular rate and rhythm. No murmur  appreciated. ABDOMEN:  Soft, nontender.  Positive, normoactive bowel sounds. No organomegaly palpated. MSK:  No focal spinal tenderness to palpation. Full range of motion bilaterally in the upper extremities. EXTREMITIES:  No peripheral edema.   SKIN:  Clear with no obvious rashes or skin changes. No nail dyscrasia. NEURO:  Nonfocal. Well oriented.  Appropriate affect.     LAB RESULTS:  CMP     Component Value Date/Time   NA 143 05/19/2018 0957   K 3.9 05/19/2018 0957   CL 109 05/19/2018 0957   CO2 25 05/19/2018 0957   GLUCOSE 112 (H) 05/19/2018 0957   BUN 6 05/19/2018 0957   CREATININE 0.68 05/19/2018 0957   CREATININE 0.67 03/23/2018 0855   CALCIUM 9.0 05/19/2018 0957   PROT 6.6 05/19/2018 0957   ALBUMIN 3.6 05/19/2018 0957   AST 11 (L) 05/19/2018 0957   AST 15 03/23/2018 0855   ALT 9 05/19/2018 0957   ALT 17 03/23/2018 0855   ALKPHOS 124 05/19/2018 0957   BILITOT 0.4 05/19/2018 0957   BILITOT 1.0 03/23/2018 0855   GFRNONAA >60 05/19/2018 0957   GFRNONAA >60 03/23/2018 0855   GFRAA >60 05/19/2018 0957   GFRAA >60 03/23/2018 0855    No results found for: TOTALPROTELP, ALBUMINELP, A1GS, A2GS, BETS, BETA2SER, GAMS, MSPIKE, SPEI  No results found for: Nils Pyle, [facility]  Lab Results  Component Value Date   WBC 2.9 (L) 05/27/2018   NEUTROABS PENDING 05/27/2018   HGB 10.5 (L) 05/27/2018   HCT 32.6 (L) 05/27/2018   MCV 93.9 05/27/2018   PLT 112 (L) 05/27/2018      Chemistry      Component Value Date/Time   NA 143 05/19/2018 0957   K 3.9 05/19/2018 0957   CL 109 05/19/2018 0957   CO2 25 05/19/2018 0957   BUN 6 05/19/2018 0957   CREATININE 0.68 05/19/2018 0957   CREATININE 0.67 03/23/2018 0855      Component Value Date/Time    CALCIUM 9.0 05/19/2018 0957   ALKPHOS 124 05/19/2018 0957   AST 11 (L) 05/19/2018 0957   AST 15 03/23/2018 0855   ALT 9 05/19/2018 0957   ALT 17 03/23/2018 0855   BILITOT 0.4 05/19/2018 0957   BILITOT 1.0 03/23/2018 0855       No results found for: LABCA2  No components found for: [id number]  No results for input(s): INR in the last 168 hours.  No results found for: LABCA2  No results found for: CAN199  No results found for: ESL753  No results found for: YYF110  Lab Results  Component Value Date   CA2729 213.7 (H) 04/07/2018    No components found for: HGQUANT  No results found for: CEA1 / No results found for: CEA1   No results found for: AFPTUMOR  No results found for: [place]  No results found for: PSA1  Appointment on 05/27/2018  Component Date Value Ref Range Status  . WBC 05/27/2018 2.9* 4.0 - 10.5 K/uL Final  . RBC 05/27/2018 3.47* 3.87 - 5.11 MIL/uL Final  . Hemoglobin 05/27/2018 10.5* 12.0 - 15.0 g/dL Final  . HCT 05/27/2018 32.6* 36.0 - 46.0 % Final  . MCV 05/27/2018 93.9  80.0 - 100.0 fL Final  . MCH 05/27/2018 30.3  26.0 - 34.0 pg Final  . MCHC 05/27/2018 32.2  30.0 - 36.0 g/dL Final  . RDW 05/27/2018 14.1  11.5 - 15.5 % Final  . Platelets 05/27/2018 112* 150 - 400 K/uL Final  . nRBC 05/27/2018 0.0  0.0 - 0.2 % Final   Performed at [facility], [place] [address] [place], [place] [address]  . Neutrophils Relative % 05/27/2018 PENDING  % Incomplete  . Neutro Abs 05/27/2018 PENDING  1.7 - 7.7 K/uL Incomplete  . Band Neutrophils 05/27/2018 PENDING  % Incomplete  . Lymphocytes Relative 05/27/2018 PENDING  % Incomplete  . Lymphs Abs 05/27/2018 PENDING  0.7 - 4.0 K/uL Incomplete  . Monocytes Relative 05/27/2018 PENDING  % Incomplete  . Monocytes Absolute 05/27/2018 PENDING  0.1 - 1.0 K/uL Incomplete  . Eosinophils Relative 05/27/2018 PENDING  % Incomplete  . Eosinophils Absolute 05/27/2018 PENDING  0.0 - 0.5 K/uL Incomplete    . Basophils Relative 05/27/2018 PENDING  % Incomplete  . Basophils Absolute 05/27/2018 PENDING  0.0 - 0.1 K/uL Incomplete  . WBC Morphology 05/27/2018 PENDING   Incomplete  . RBC Morphology 05/27/2018 PENDING   Incomplete  . Smear Review 05/27/2018 PENDING   Incomplete  . Other 05/27/2018 PENDING  % Incomplete  . nRBC 05/27/2018 PENDING  0 /100 WBC Incomplete  . Metamyelocytes Relative 05/27/2018 PENDING  % Incomplete  . Myelocytes 05/27/2018 PENDING  % Incomplete  . Promyelocytes Relative 05/27/2018 PENDING  % Incomplete  . Blasts 05/27/2018 PENDING  % Incomplete    (this displays the last labs from the last 3 days)  No results found for: TOTALPROTELP, ALBUMINELP, A1GS, A2GS, BETS, BETA2SER, GAMS, MSPIKE, SPEI (this displays SPEP labs)  No results found for: KPAFRELGTCHN, LAMBDASER, KAPLAMBRATIO (kappa/lambda light chains)  No results found for: HGBA, HGBA2QUANT, HGBFQUANT, HGBSQUAN (Hemoglobinopathy evaluation)   No results found for: LDH  No results found for: IRON, TIBC, IRONPCTSAT (Iron and TIBC)  No results found for: FERRITIN  Urinalysis No results found for: COLORURINE, APPEARANCEUR, LABSPEC, PHURINE, GLUCOSEU, HGBUR, BILIRUBINUR, KETONESUR, PROTEINUR, UROBILINOGEN, NITRITE, LEUKOCYTESUR   STUDIES: No results found.   ELIGIBLE FOR AVAILABLE RESEARCH PROTOCOL:  May eventually qualify for BR004; UPBEAT  ASSESSMENT: 47 y.o. [place] woman status post left breast overlapping site biopsy and left axillary lymph node biopsy 03/16/2018 for a clinical T2 N3, stage IIIC invasive ductal carcinoma, triple negative, with an MIB-1 of 15%   (1) Staging studies  (a) CT chest on 04/05/2018: left axillary and chronic left retropectoral adenopathy, supraclavicular adenopathy, left breast skin thickening and mass, 37m left upper lobe pulmonary nodule (repeat ct chest in 3-6 months recommended), bronchiectasis in left lower lobe.  (b) CT neck on 04/05/2018: left supraclavicular  adenopathy, ? Abnormality in left frontal lobe of brain  (c) Bone scan on 04/05/2018: no findings suggestive of metastatic disease to bone  (d) MRI brain 04/07/2018: negative  (2) neoadjuvant chemotherapy to consist of doxorubicin and cyclophosphamide in dose dense fashion x4 followed by weekly carboplatin and paclitaxel x12  (3) genetics testing 11/18: no deleterious mutations.  Genes tested include: AIP, ALK, APC, ATM, AXIN2,BAP1,  BARD1, BLM, BMPR1A, BRCA1, BRCA2, BRIP1, CASR, CDC73, CDH1, CDK4, CDKN1B, CDKN1C, CDKN2A (p14ARF), CDKN2A (p16INK4a), CEBPA, CHEK2, CTNNA1, DICER1, DIS3L2, EGFR (c.2369C>T, p.Thr790Met variant only), EPCAM (Deletion/duplication testing only), FH, FLCN, GATA2, GPC3, GREM1 (Promoter region deletion/duplication testing only), HOXB13 (c.251G>A, p.Gly84Glu), HRAS, KIT, MAX, MEN1, MET, MITF (c.952G>A, p.Glu318Lys variant only), MLH1, MSH2, MSH3, MSH6, MUTYH, NBN, NF1, NF2, NTHL1, PALB2, PDGFRA, PHOX2B, PMS2, POLD1, POLE, POT1, PRKAR1A, PTCH1, PTEN, RAD50, RAD51C, RAD51D, RB1, RECQL4, RET, RUNX1, SDHAF2, SDHA (sequence changes only), SDHB, SDHC, SDHD, SMAD4, SMARCA4, SMARCB1, SMARCE1, STK11, SUFU, TERC, TERT, TMEM127, TP53, TSC1, TSC2, VHL, WRN and WT1.  (4) definitive surgery to  follow  (5) adjuvant radiation  PLAN: Paula Berry is not feeling well today.  She is tearful due to how poorly she has felt.  She has completed her first four treatments with Doxorubicin and Cyclophosphamide.  I spoke with her friend Rolando over face time during the appointment as well.  Lovey Newcomer is a PA and works in [place] in oncology.  I answered questions about Keyani's genetic testing and chemotherapy plan in detail.    Muskan's WBC are slightly low, and her ANC is pending.  She will receive one liter of IV fluids today since she has had decreased PO intake.  I recommended that she drink 4 bottles of water per day.  I encouraged her to rest and recover this week.  She was also recommended to use  systane eye gtts four times a day to help with the tearing in her eyes.    Kaithlyn will return in one week for labs, f/u with Dr. Jana Hakim, and to start Paclitaxel/Carboplatin. She knows to call for any other issues that may develop before that visit.  A total of (30) minutes of face-to-face time was spent with this patient with greater than 50% of that time in counseling and care-coordination.   Wilber Bihari, NP  05/27/18 1:31 PM Medical Oncology and Hematology [facility] [address] [place], [place] [address] Tel. [phone]    Fax. [phone]

## 2018-05-27 NOTE — Patient Instructions (Addendum)
1. IV fluids today for your decreased intake. Try to drink 4 bottles of water a day.    2.  Systane eye drops, four times per day to help with the watery eyes.     [employer], Adult A central line is a thin, flexible tube (catheter) that is put in your vein. It can be used to:  Give you medicine.  Give you food and nutrients. Follow these instructions at home: Caring for the tube   Follow instructions from your doctor about: ? Flushing the tube with saline solution. ? Cleaning the tube and the area around it.  Only flush with clean (sterile) supplies. The supplies should be from your doctor, a pharmacy, or another place that your doctor recommends.  Before you flush the tube or clean the area around the tube: ? Wash your hands with soap and water. If you cannot use soap and water, use hand sanitizer. ? Clean the central line hub with rubbing alcohol. Caring for your skin  Keep the area where the tube was put in clean and dry.  Every day, and when changing the bandage, check the skin around the central line for: ? Redness, swelling, or pain. ? Fluid or blood. ? Warmth. ? Pus. ? A bad smell. General instructions  Keep the tube clamped, unless it is being used.  Keep your supplies in a clean, dry location.  If you or someone else accidentally pulls on the tube, make sure: ? The bandage (dressing) is okay. ? There is no bleeding. ? The tube has not been pulled out.  Do not use scissors or sharp objects near the tube.  Do not swim or let the tube soak in a tub.  Ask your doctor what activities are safe for you. Your doctor may tell you not to lift anything or move your arm too much.  Take over-the-counter and prescription medicines only as told by your doctor.  Change bandages as told by your doctor.  Keep your bandage dry. If a bandage gets wet, have it changed right away.  Keep all follow-up visits as told by your doctor. This is important. Throwing away  supplies  Throw away any syringes in a trash (disposal) container that is only for sharp items (sharps container). You can buy a sharps container from a pharmacy, or you can make one by using an empty hard plastic bottle with a cover.  Place any used bandages or infusion bags into a plastic bag. Throw that bag in the trash. Contact a doctor if:  You have any of these where the tube was put in: ? Redness, swelling, or pain. ? Fluid or blood. ? A warm feeling. ? Pus or a bad smell. Get help right away if:  You have: ? A fever. ? Chills. ? Trouble getting enough air (shortness of breath). ? Trouble breathing. ? Pain in your chest. ? Swelling in your neck, face, chest, or arm.  You are coughing.  You feel your heart beating fast or skipping beats.  You feel dizzy or you pass out (faint).  There are red lines coming from where the tube was put in.  The area where the tube was put in is bleeding and the bleeding will not stop.  Your tube is hard to flush.  You do not get a blood return from the tube.  The tube gets loose or comes out.  The tube has a hole or a tear.  The tube leaks. Summary  A central  line is a thin, flexible tube (catheter) that is put in your vein. It can be used to take blood for lab tests or to give you medicine.  Follow instructions from your doctor about flushing and cleaning the tube.  Keep the area where the tube was put in clean and dry.  Ask your doctor what activities are safe for you. This information is not intended to replace advice given to you by your health care provider. Make sure you discuss any questions you have with your health care provider. Document Released: 05/04/2012 Document Revised: 06/04/2016 Document Reviewed: 06/04/2016 Elsevier Interactive Patient Education  2019 [employer].

## 2018-05-30 ENCOUNTER — Telehealth: Payer: Self-pay | Admitting: Adult Health

## 2018-05-30 NOTE — Telephone Encounter (Signed)
No los per 12/27

## 2018-06-02 ENCOUNTER — Inpatient Hospital Stay (HOSPITAL_BASED_OUTPATIENT_CLINIC_OR_DEPARTMENT_OTHER): Payer: BC Managed Care – PPO | Admitting: Oncology

## 2018-06-02 ENCOUNTER — Inpatient Hospital Stay: Payer: BC Managed Care – PPO

## 2018-06-02 ENCOUNTER — Encounter: Payer: Self-pay | Admitting: *Deleted

## 2018-06-02 ENCOUNTER — Inpatient Hospital Stay: Payer: BC Managed Care – PPO | Attending: Oncology

## 2018-06-02 VITALS — BP 129/87 | HR 84 | Resp 16

## 2018-06-02 VITALS — BP 119/75 | Resp 18 | Ht 69.0 in | Wt 207.3 lb

## 2018-06-02 DIAGNOSIS — Z90710 Acquired absence of both cervix and uterus: Secondary | ICD-10-CM | POA: Insufficient documentation

## 2018-06-02 DIAGNOSIS — C50412 Malignant neoplasm of upper-outer quadrant of left female breast: Secondary | ICD-10-CM | POA: Diagnosis not present

## 2018-06-02 DIAGNOSIS — Z171 Estrogen receptor negative status [ER-]: Secondary | ICD-10-CM | POA: Insufficient documentation

## 2018-06-02 DIAGNOSIS — Z23 Encounter for immunization: Secondary | ICD-10-CM | POA: Insufficient documentation

## 2018-06-02 DIAGNOSIS — R110 Nausea: Secondary | ICD-10-CM

## 2018-06-02 DIAGNOSIS — R5383 Other fatigue: Secondary | ICD-10-CM | POA: Insufficient documentation

## 2018-06-02 DIAGNOSIS — Z801 Family history of malignant neoplasm of trachea, bronchus and lung: Secondary | ICD-10-CM

## 2018-06-02 DIAGNOSIS — Z808 Family history of malignant neoplasm of other organs or systems: Secondary | ICD-10-CM | POA: Insufficient documentation

## 2018-06-02 DIAGNOSIS — Z5111 Encounter for antineoplastic chemotherapy: Secondary | ICD-10-CM | POA: Insufficient documentation

## 2018-06-02 DIAGNOSIS — C773 Secondary and unspecified malignant neoplasm of axilla and upper limb lymph nodes: Secondary | ICD-10-CM

## 2018-06-02 DIAGNOSIS — R630 Anorexia: Secondary | ICD-10-CM

## 2018-06-02 DIAGNOSIS — M791 Myalgia, unspecified site: Secondary | ICD-10-CM | POA: Diagnosis not present

## 2018-06-02 DIAGNOSIS — Z90722 Acquired absence of ovaries, bilateral: Secondary | ICD-10-CM | POA: Diagnosis not present

## 2018-06-02 DIAGNOSIS — Z79899 Other long term (current) drug therapy: Secondary | ICD-10-CM

## 2018-06-02 DIAGNOSIS — R439 Unspecified disturbances of smell and taste: Secondary | ICD-10-CM

## 2018-06-02 DIAGNOSIS — K219 Gastro-esophageal reflux disease without esophagitis: Secondary | ICD-10-CM | POA: Insufficient documentation

## 2018-06-02 DIAGNOSIS — Z95828 Presence of other vascular implants and grafts: Secondary | ICD-10-CM

## 2018-06-02 DIAGNOSIS — K5900 Constipation, unspecified: Secondary | ICD-10-CM | POA: Diagnosis not present

## 2018-06-02 LAB — COMPREHENSIVE METABOLIC PANEL
ALT: 12 U/L (ref 0–44)
AST: 10 U/L — ABNORMAL LOW (ref 15–41)
Albumin: 3.7 g/dL (ref 3.5–5.0)
Alkaline Phosphatase: 94 U/L (ref 38–126)
Anion gap: 9 (ref 5–15)
BUN: <4 mg/dL — ABNORMAL LOW (ref 6–20)
CHLORIDE: 108 mmol/L (ref 98–111)
CO2: 26 mmol/L (ref 22–32)
Calcium: 9.1 mg/dL (ref 8.9–10.3)
Creatinine, Ser: 0.69 mg/dL (ref 0.44–1.00)
GFR calc Af Amer: >60 mL/min (ref >60)
GFR calc non Af Amer: >60 mL/min (ref >60)
Glucose, Bld: 110 mg/dL — ABNORMAL HIGH (ref 70–99)
Potassium: 4 mmol/L (ref 3.5–5.1)
Sodium: 143 mmol/L (ref 135–145)
Total Bilirubin: 0.5 mg/dL (ref 0.3–1.2)
Total Protein: 6.5 g/dL (ref 6.5–8.1)

## 2018-06-02 LAB — CBC WITH DIFFERENTIAL/PLATELET
Abs Immature Granulocytes: 0.48 10*3/uL — ABNORMAL HIGH (ref 0.00–0.07)
Basophils Absolute: 0 10*3/uL (ref 0.0–0.1)
Basophils Relative: 0 %
Eosinophils Absolute: 0 10*3/uL (ref 0.0–0.5)
Eosinophils Relative: 0 %
HCT: 32.9 % — ABNORMAL LOW (ref 36.0–46.0)
Hemoglobin: 10.4 g/dL — ABNORMAL LOW (ref 12.0–15.0)
Immature Granulocytes: 4 %
LYMPHS ABS: 0.8 10*3/uL (ref 0.7–4.0)
Lymphocytes Relative: 7 %
MCH: 30.9 pg (ref 26.0–34.0)
MCHC: 31.6 g/dL (ref 30.0–36.0)
MCV: 97.6 fL (ref 80.0–100.0)
Monocytes Absolute: 1.6 10*3/uL — ABNORMAL HIGH (ref 0.1–1.0)
Monocytes Relative: 13 %
Neutro Abs: 9.2 10*3/uL — ABNORMAL HIGH (ref 1.7–7.7)
Neutrophils Relative %: 76 %
Platelets: 179 10*3/uL (ref 150–400)
RBC: 3.37 MIL/uL — ABNORMAL LOW (ref 3.87–5.11)
RDW: 17.1 % — ABNORMAL HIGH (ref 11.5–15.5)
WBC: 12.2 10*3/uL — ABNORMAL HIGH (ref 4.0–10.5)
nRBC: 0.8 % — ABNORMAL HIGH (ref 0.0–0.2)

## 2018-06-02 MED ORDER — INFLUENZA VAC SPLIT QUAD 0.5 ML IM SUSY
PREFILLED_SYRINGE | INTRAMUSCULAR | Status: AC
  Filled 2018-06-02: qty 0.5

## 2018-06-02 MED ORDER — FAMOTIDINE IN NACL 20-0.9 MG/50ML-% IV SOLN
INTRAVENOUS | Status: AC
  Filled 2018-06-02: qty 50

## 2018-06-02 MED ORDER — SODIUM CHLORIDE 0.9 % IV SOLN
Freq: Once | INTRAVENOUS | Status: AC
  Administered 2018-06-02: 12:00:00 via INTRAVENOUS
  Filled 2018-06-02: qty 250

## 2018-06-02 MED ORDER — SODIUM CHLORIDE 0.9 % IV SOLN
Freq: Once | INTRAVENOUS | Status: AC
  Administered 2018-06-02: 12:00:00 via INTRAVENOUS
  Filled 2018-06-02: qty 5

## 2018-06-02 MED ORDER — PALONOSETRON HCL INJECTION 0.25 MG/5ML
0.2500 mg | Freq: Once | INTRAVENOUS | Status: AC
  Administered 2018-06-02: 0.25 mg via INTRAVENOUS

## 2018-06-02 MED ORDER — DIPHENHYDRAMINE HCL 50 MG/ML IJ SOLN
INTRAMUSCULAR | Status: AC
  Filled 2018-06-02: qty 1

## 2018-06-02 MED ORDER — SODIUM CHLORIDE 0.9% FLUSH
10.0000 mL | INTRAVENOUS | Status: DC | PRN
  Administered 2018-06-02: 10 mL
  Filled 2018-06-02: qty 10

## 2018-06-02 MED ORDER — DIPHENHYDRAMINE HCL 50 MG/ML IJ SOLN
25.0000 mg | Freq: Once | INTRAMUSCULAR | Status: AC
  Administered 2018-06-02: 25 mg via INTRAVENOUS

## 2018-06-02 MED ORDER — SODIUM CHLORIDE 0.9 % IV SOLN
80.0000 mg/m2 | Freq: Once | INTRAVENOUS | Status: AC
  Administered 2018-06-02: 174 mg via INTRAVENOUS
  Filled 2018-06-02: qty 29

## 2018-06-02 MED ORDER — INFLUENZA VAC SPLIT QUAD 0.5 ML IM SUSY
0.5000 mL | PREFILLED_SYRINGE | Freq: Once | INTRAMUSCULAR | Status: AC
  Administered 2018-06-02: 0.5 mL via INTRAMUSCULAR

## 2018-06-02 MED ORDER — HEPARIN SOD (PORK) LOCK FLUSH 100 UNIT/ML IV SOLN
500.0000 [IU] | Freq: Once | INTRAVENOUS | Status: AC | PRN
  Administered 2018-06-02: 500 [IU]
  Filled 2018-06-02: qty 5

## 2018-06-02 MED ORDER — SODIUM CHLORIDE 0.9 % IV SOLN
300.0000 mg | Freq: Once | INTRAVENOUS | Status: AC
  Administered 2018-06-02: 300 mg via INTRAVENOUS
  Filled 2018-06-02: qty 30

## 2018-06-02 MED ORDER — PALONOSETRON HCL INJECTION 0.25 MG/5ML
INTRAVENOUS | Status: AC
  Filled 2018-06-02: qty 5

## 2018-06-02 MED ORDER — FAMOTIDINE IN NACL 20-0.9 MG/50ML-% IV SOLN
20.0000 mg | Freq: Once | INTRAVENOUS | Status: AC
  Administered 2018-06-02: 20 mg via INTRAVENOUS

## 2018-06-02 MED ORDER — SODIUM CHLORIDE 0.9% FLUSH
10.0000 mL | Freq: Once | INTRAVENOUS | Status: AC
  Administered 2018-06-02: 10 mL
  Filled 2018-06-02: qty 10

## 2018-06-02 NOTE — Patient Instructions (Signed)
[facility] Discharge Instructions for Patients Receiving Chemotherapy  Today you received the following chemotherapy agents Taxol; Carboplatin  To help prevent nausea and vomiting after your treatment, we encourage you to take your nausea medication as directed   If you develop nausea and vomiting that is not controlled by your nausea medication, call the clinic.   BELOW ARE SYMPTOMS THAT SHOULD BE REPORTED IMMEDIATELY:  *FEVER GREATER THAN 100.5 F  *CHILLS WITH OR WITHOUT FEVER  NAUSEA AND VOMITING THAT IS NOT CONTROLLED WITH YOUR NAUSEA MEDICATION  *UNUSUAL SHORTNESS OF BREATH  *UNUSUAL BRUISING OR BLEEDING  TENDERNESS IN MOUTH AND THROAT WITH OR WITHOUT PRESENCE OF ULCERS  *URINARY PROBLEMS  *BOWEL PROBLEMS  UNUSUAL RASH Items with * indicate a potential emergency and should be followed up as soon as possible.  Feel free to call the clinic should you have any questions or concerns. The clinic phone number is [phone].  Please show the CHEMO ALERT CARD at check-in to the Emergency Department and triage nurse.  Paclitaxel injection What is this medicine? PACLITAXEL (PAK li TAX el) is a chemotherapy drug. It targets fast dividing cells, like cancer cells, and causes these cells to die. This medicine is used to treat ovarian cancer, breast cancer, lung cancer, Kaposi's sarcoma, and other cancers. This medicine may be used for other purposes; ask your health care provider or pharmacist if you have questions. COMMON BRAND NAME(S): Onxol, Taxol What should I tell my health care provider before I take this medicine? They need to know if you have any of these conditions: -history of irregular heartbeat -liver disease -low blood counts, like low white cell, platelet, or red cell counts -lung or breathing disease, like asthma -tingling of the fingers or toes, or other nerve disorder -an unusual or allergic reaction to paclitaxel, alcohol, polyoxyethylated  castor oil, other chemotherapy, other medicines, foods, dyes, or preservatives -pregnant or trying to get pregnant -breast-feeding How should I use this medicine? This drug is given as an infusion into a vein. It is administered in a hospital or clinic by a specially trained health care professional. Talk to your pediatrician regarding the use of this medicine in children. Special care may be needed. Overdosage: If you think you have taken too much of this medicine contact a poison control center or emergency room at once. NOTE: This medicine is only for you. Do not share this medicine with others. What if I miss a dose? It is important not to miss your dose. Call your doctor or health care professional if you are unable to keep an appointment. What may interact with this medicine? Do not take this medicine with any of the following medications: -disulfiram -metronidazole This medicine may also interact with the following medications: -antiviral medicines for hepatitis, HIV or AIDS -certain antibiotics like erythromycin and clarithromycin -certain medicines for fungal infections like ketoconazole and itraconazole -certain medicines for seizures like carbamazepine, phenobarbital, phenytoin -gemfibrozil -nefazodone -rifampin -St. John's wort This list may not describe all possible interactions. Give your health care provider a list of all the medicines, herbs, non-prescription drugs, or dietary supplements you use. Also tell them if you smoke, drink alcohol, or use illegal drugs. Some items may interact with your medicine. What should I watch for while using this medicine? Your condition will be monitored carefully while you are receiving this medicine. You will need important blood work done while you are taking this medicine. This medicine can cause serious allergic reactions. To reduce your   you will need to take other medicine(s) before treatment with this medicine. If you experience  allergic reactions like skin rash, itching or hives, swelling of the face, lips, or tongue, tell your doctor or health care professional right away. In some cases, you may be given additional medicines to help with side effects. Follow all directions for their use. This drug may make you feel generally unwell. This is not uncommon, as chemotherapy can affect healthy cells as well as cancer cells. Report any side effects. Continue your course of treatment even though you feel ill unless your doctor tells you to stop. Call your doctor or health care professional for advice if you get a fever, chills or sore throat, or other symptoms of a cold or flu. Do not treat yourself. This drug decreases your body's ability to fight infections. Try to avoid being around people who are sick. This medicine may increase your risk to bruise or bleed. Call your doctor or health care professional if you notice any unusual bleeding. Be careful brushing and flossing your teeth or using a toothpick because you may get an infection or bleed more easily. If you have any dental work done, tell your dentist you are receiving this medicine. Avoid taking products that contain aspirin, acetaminophen, ibuprofen, naproxen, or ketoprofen unless instructed by your doctor. These medicines may hide a fever. Do not become pregnant while taking this medicine. Women should inform their doctor if they wish to become pregnant or think they might be pregnant. There is a potential for serious side effects to an unborn child. Talk to your health care professional or pharmacist for more information. Do not breast-feed an infant while taking this medicine. Men are advised not to father a child while receiving this medicine. This product may contain alcohol. Ask your pharmacist or healthcare provider if this medicine contains alcohol. Be sure to tell all healthcare providers you are taking this medicine. Certain medicines, like metronidazole and  disulfiram, can cause an unpleasant reaction when taken with alcohol. The reaction includes flushing, headache, nausea, vomiting, sweating, and increased thirst. The reaction can last from 30 minutes to several hours. What side effects may I notice from receiving this medicine? Side effects that you should report to your doctor or health care professional as soon as possible: -allergic reactions like skin rash, itching or hives, swelling of the face, lips, or tongue -breathing problems -changes in vision -fast, irregular heartbeat -high or low blood pressure -mouth sores -pain, tingling, numbness in the hands or feet -signs of decreased platelets or bleeding - bruising, pinpoint red spots on the skin, black, tarry stools, blood in the urine -signs of decreased red blood cells - unusually weak or tired, feeling faint or lightheaded, falls -signs of infection - fever or chills, cough, sore throat, pain or difficulty passing urine -signs and symptoms of liver injury like dark yellow or brown urine; general ill feeling or flu-like symptoms; light-colored stools; loss of appetite; nausea; right upper belly pain; unusually weak or tired; yellowing of the eyes or skin -swelling of the ankles, feet, hands -unusually slow heartbeat Side effects that usually do not require medical attention (report to your doctor or health care professional if they continue or are bothersome): -diarrhea -hair loss -loss of appetite -muscle or joint pain -nausea, vomiting -pain, redness, or irritation at site where injected -tiredness This list may not describe all possible side effects. Call your doctor for medical advice about side effects. You may report side effects to FDA  FDA at 1-800-FDA-1088. Where should I keep my medicine? This drug is given in a hospital or clinic and will not be stored at home. NOTE: This sheet is a summary. It may not cover all possible information. If you have questions about this medicine,  talk to your doctor, pharmacist, or health care provider.  2019 [employer] Standard (2017-01-19 13:14:55)   Carboplatin injection What is this medicine? CARBOPLATIN (KAR boe pla tin) is a chemotherapy drug. It targets fast dividing cells, like cancer cells, and causes these cells to die. This medicine is used to treat ovarian cancer and many other cancers. This medicine may be used for other purposes; ask your health care provider or pharmacist if you have questions. COMMON BRAND NAME(S): Paraplatin What should I tell my health care provider before I take this medicine? They need to know if you have any of these conditions: -blood disorders -hearing problems -kidney disease -recent or ongoing radiation therapy -an unusual or allergic reaction to carboplatin, cisplatin, other chemotherapy, other medicines, foods, dyes, or preservatives -pregnant or trying to get pregnant -breast-feeding How should I use this medicine? This drug is usually given as an infusion into a vein. It is administered in a hospital or clinic by a specially trained health care professional. Talk to your pediatrician regarding the use of this medicine in children. Special care may be needed. Overdosage: If you think you have taken too much of this medicine contact a poison control center or emergency room at once. NOTE: This medicine is only for you. Do not share this medicine with others. What if I miss a dose? It is important not to miss a dose. Call your doctor or health care professional if you are unable to keep an appointment. What may interact with this medicine? -medicines for seizures -medicines to increase blood counts like filgrastim, pegfilgrastim, sargramostim -some antibiotics like amikacin, gentamicin, neomycin, streptomycin, tobramycin -vaccines Talk to your doctor or health care professional before taking any of these medicines: -acetaminophen -aspirin -ibuprofen -ketoprofen -naproxen This  list may not describe all possible interactions. Give your health care provider a list of all the medicines, herbs, non-prescription drugs, or dietary supplements you use. Also tell them if you smoke, drink alcohol, or use illegal drugs. Some items may interact with your medicine. What should I watch for while using this medicine? Your condition will be monitored carefully while you are receiving this medicine. You will need important blood work done while you are taking this medicine. This drug may make you feel generally unwell. This is not uncommon, as chemotherapy can affect healthy cells as well as cancer cells. Report any side effects. Continue your course of treatment even though you feel ill unless your doctor tells you to stop. In some cases, you may be given additional medicines to help with side effects. Follow all directions for their use. Call your doctor or health care professional for advice if you get a fever, chills or sore throat, or other symptoms of a cold or flu. Do not treat yourself. This drug decreases your body's ability to fight infections. Try to avoid being around people who are sick. This medicine may increase your risk to bruise or bleed. Call your doctor or health care professional if you notice any unusual bleeding. Be careful brushing and flossing your teeth or using a toothpick because you may get an infection or bleed more easily. If you have any dental work done, tell your dentist you are receiving this medicine. Avoid taking products   that contain aspirin, acetaminophen, ibuprofen, naproxen, or ketoprofen unless instructed by your doctor. These medicines may hide a fever. Do not become pregnant while taking this medicine. Women should inform their doctor if they wish to become pregnant or think they might be pregnant. There is a potential for serious side effects to an unborn child. Talk to your health care professional or pharmacist for more information. Do not  breast-feed an infant while taking this medicine. What side effects may I notice from receiving this medicine? Side effects that you should report to your doctor or health care professional as soon as possible: -allergic reactions like skin rash, itching or hives, swelling of the face, lips, or tongue -signs of infection - fever or chills, cough, sore throat, pain or difficulty passing urine -signs of decreased platelets or bleeding - bruising, pinpoint red spots on the skin, black, tarry stools, nosebleeds -signs of decreased red blood cells - unusually weak or tired, fainting spells, lightheadedness -breathing problems -changes in hearing -changes in vision -chest pain -high blood pressure -low blood counts - This drug may decrease the number of white blood cells, red blood cells and platelets. You may be at increased risk for infections and bleeding. -nausea and vomiting -pain, swelling, redness or irritation at the injection site -pain, tingling, numbness in the hands or feet -problems with balance, talking, walking -trouble passing urine or change in the amount of urine Side effects that usually do not require medical attention (report to your doctor or health care professional if they continue or are bothersome): -hair loss -loss of appetite -metallic taste in the mouth or changes in taste This list may not describe all possible side effects. Call your doctor for medical advice about side effects. You may report side effects to FDA at 1-800-FDA-1088. Where should I keep my medicine? This drug is given in a hospital or clinic and will not be stored at home. NOTE: This sheet is a summary. It may not cover all possible information. If you have questions about this medicine, talk to your doctor, pharmacist, or health care provider.  2019 [employer]/Gold Standard (2007-08-23 14:38:05)    

## 2018-06-02 NOTE — Progress Notes (Signed)
Received phone call from Dr. Jana Hakim clarifying tx plan: Carbo AUC = 2 + Taxol 80 mg/m2 weekly x 12. He would like to continue the Emend/Dex at least for 1st cycle given pts severe CINV w/ previous tx.  He will reassess next cycle for appropriateness & need (low AUC of Carbo). Kennith Center, Pharm.D., CPP 06/02/2018@11 :48 AM

## 2018-06-02 NOTE — Progress Notes (Signed)
[place]  [phone] [phone]     ID: Paula Berry DOB: [birth year]  MR#: [id number]  UUV#:[id number]  Patient Care Team: Vernie Shanks, MD as PCP - General (Family Medicine) Rolm Bookbinder, MD as Consulting Physician (General Surgery) Magrinat, Virgie Dad, MD as Consulting Physician (Oncology) Eppie Gibson, MD as Attending Physician (Radiation Oncology) Everlene Farrier, MD as Consulting Physician (Obstetrics and Gynecology) Chauncey Cruel, MD OTHER MD:  CHIEF COMPLAINT: Triple negative breast cancer  CURRENT TREATMENT: Neoadjuvant chemotherapy   HISTORY OF CURRENT ILLNESS: From the original intake note:  The patient had a left mammogram with tomography and ultrasonography 07/12/2015 at the [facility] after screening recall for a possible left breast mass.  This found the breast density to be category C.  There was a small lobulated mass in the lower outer aspect of the left breast, which was not palpable.  Ultrasound showed a small lobulated cyst in that area measuring 0.6 cm.  This was felt to be benign.  In July 2019 she had screening mammography at Dr. Clementeen Hoof office.  I do not have that report but according to the patient it was unremarkable.  In October 2019 however the patient developed left axillary swelling and tenderness and was again set up for left mammography with tomography and left breast ultrasonography at the [facility], 03/11/2018.  Breast density was category C.  Mammography showed numerous markedly enlarged left axillary lymph nodes.  There was periareolar skin thickening.  There was an area of ill-defined distortion in the upper outer left breast and on physical exam there was a firm lump superior and lateral to the left nipple, with swelling of the left axilla.  Ultrasound of the left breast and both upper quadrants found an ill-defined irregular hypoechoic area measuring at least 4.2 cm.  There  also multiple scattered cysts.  Ultrasound of the left axilla showed at least 6 abnormal lymph nodes.  On 03/16/2018 the patient underwent right mammography with tomography and right breast ultrasonography at the [facility].  Again breast density was category C.  There were multiple circumscribed equal density masses in the upper outer quadrant of the right breast which appeared stable.  Ultrasound showed diffuse fibrocystic changes.  An additional hypoechoic mass was noted at the 9 o'clock position 3 cm from the nipple measuring 0.8 cm, with no associated vascularity.  A complex solid and cystic mass was noted superficially at the 6 o'clock position 3 cm from the nipple measuring 1.6 cm.  Evaluation of the right axilla was negative.  On 03/18/2018 the patient underwent biopsy of the 2 suspicious areas in the right breast, and they both showed only fibrocystic changes with some chronic inflammation ([id number]).  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  The patient's subsequent history is as detailed below.  INTERVAL HISTORY: Paula Berry returns today for follow-up of her triple negative breast cancer.   The patient continues on neoadjuvant chemotherapy.  She completed 4 cycles of doxorubicin and cyclophosphamide on 05/19/2018.  She is now scheduled for weekly Paclitaxel and Carboplatin x12, with her first dose due today.    REVIEW OF SYSTEMS: Paula Berry reports doing well overall. She states she likes to do Zumba but has not been back yet. She also walks. She denies nausea  or vomiting and stomach cramps since her last visit. She reports decrease in taste and loss of appetite. The patient denies unusual headaches, visual changes, stiff neck, dizziness, or gait imbalance. There  has been no cough, phlegm production, or pleurisy, no chest pain or pressure, and no change in bowel or bladder habits. The patient denies fever, rash, bleeding, unexplained fatigue or unexplained weight loss. A detailed review of systems was otherwise entirely negative.   PAST MEDICAL HISTORY: Past Medical History:  Diagnosis Date  . Anemia    had iron infusion 08/12/15  . Asthma   . Cancer [facility])    Breast cancer   . Genital herpes   . GERD (gastroesophageal reflux disease)   . Headache    migraines 2-3 times monthly  . Pneumonia    hospitalizied for pneumonia as a child    PAST SURGICAL HISTORY: Past Surgical History:  Procedure Laterality Date  . ABDOMINAL HYSTERECTOMY Bilateral 08/22/2015   Procedure: HYSTERECTOMY ABDOMINAL, BILATERAL SALPINGECTOMY;  Surgeon: Everlene Farrier, MD;  Location: [facility];  Service: Gynecology;  Laterality: Bilateral;  . MYOMECTOMY ABDOMINAL APPROACH    . PORTACATH PLACEMENT N/A 03/30/2018   Procedure: INSERTION PORT-A-CATH WITH [place];  Surgeon: Rolm Bookbinder, MD;  Location: [facility];  Service: General;  Laterality: N/A;    FAMILY HISTORY Family History  Problem Relation Age of Onset  . Hypertension Unknown   . Diabetes Cousin        mat first cousin  . Cancer Unknown   . Dementia Maternal Grandfather   . Lung cancer Paternal Grandmother        d. 38-60  . Brain cancer Paternal Aunt        d. 70, father's mat 1/2 sister  . Diabetes Maternal Grandmother   . Other Paternal Grandfather        d. WWII  . Aneurysm Maternal Uncle        brain  . Migraines Neg Hx   The patient's parents are in their early 26s as of October 2019.  A paternal aunt had brain cancer at age 46.  A paternal grandmother had lung cancer at age 53.  There is no breast or ovarian cancer history in the family.  However note that the patient has little information on her mother side of the family.  GYNECOLOGIC HISTORY:  No LMP recorded. Patient has had a  hysterectomy. 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  Her husband [name]. works for the [employer].  He is a former patient here, with stage IV colon cancer.  They live alone, with no pets.     ADVANCED DIRECTIVES: Not in place   HEALTH MAINTENANCE: Social History   Tobacco Use  . Smoking status: Never Smoker  . Smokeless tobacco: Never Used  Substance Use Topics  . Alcohol use: Yes    Alcohol/week: 1.0 standard drinks    Types: 1 Glasses of wine per week    Comment: daily  . Drug use: No     Colonoscopy: Never  PAP: Status post hysterectomy  Bone density: Never   Allergies  Allergen Reactions  . Neosporin [Neomycin-Bacitracin Zn-Polymyx] Other (See Comments)    Decreased wound healing with blisters    Current Outpatient  Medications  Medication Sig Dispense Refill  . acetaminophen (TYLENOL) 500 MG tablet Take 1,000 mg by mouth every 8 (eight) hours as needed for moderate pain.    Marland Kitchen acyclovir ointment (ZOVIRAX) 5 % Apply 1 application topically 4 (four) times daily as needed (outbreaks).   2  . albuterol (PROVENTIL HFA;VENTOLIN HFA) 108 (90 Base) MCG/ACT inhaler Inhale 2 puffs into the lungs every 6 (six) hours as needed for wheezing or shortness of breath.     . ciprofloxacin (CIPRO) 500 MG tablet Take 1 tablet (500 mg total) by mouth 2 (two) times daily. 14 tablet 0  . dexamethasone (DECADRON) 4 MG tablet Take 2 tablets by mouth once a day on the day after chemotherapy and then take 2 tablets two times a day for 2 days. Take with food. 30 tablet 1  . lidocaine-prilocaine (EMLA) cream Apply to affected area once 30 g 3  . LORazepam (ATIVAN) 0.5 MG tablet Take 1 tablet (0.5 mg total) by mouth at  bedtime as needed (Nausea or vomiting). 30 tablet 0  . methocarbamol (ROBAXIN) 500 MG tablet Take 500 mg by mouth 3 (three) times daily as needed for muscle spasms (pain).    . montelukast (SINGULAIR) 10 MG tablet Take 10 mg by mouth every evening.    Marland Kitchen oxyCODONE (OXY IR/ROXICODONE) 5 MG immediate release tablet Take 1 tablet (5 mg total) by mouth every 6 (six) hours as needed for moderate pain, severe pain or breakthrough pain. 8 tablet 0  . pantoprazole (PROTONIX) 40 MG tablet Take 40 mg by mouth every evening.    . prochlorperazine (COMPAZINE) 10 MG tablet Take 1 tablet (10 mg total) by mouth every 6 (six) hours as needed (Nausea or vomiting). 30 tablet 1  . SYMBICORT 160-4.5 MCG/ACT inhaler INL 2 PFS PO BID  3  . valACYclovir (VALTREX) 500 MG tablet Take 500 mg by mouth See admin instructions. Take 500 mg twice daily for 3 days at onset of outbreak then take 500 mg once daily until clear     No current facility-administered medications for this visit.     OBJECTIVE: Middle-aged African-American woman who appears well Vitals:   06/02/18 1109  BP: 119/75  Resp: 18     Body mass index is 30.61 kg/m.   Wt Readings from Last 3 Encounters:  06/02/18 207 lb 4.8 oz (94 kg)  05/27/18 202 lb 6.4 oz (91.8 kg)  05/19/18 213 lb (96.6 kg)  ECOG FS:0  Sclerae unicteric, EOMs intact Oropharynx clear and moist No cervical or supraclavicular adenopathy Lungs no rales or rhonchi Heart regular rate and rhythm Abd soft, nontender, positive bowel sounds MSK no focal spinal tenderness, no upper extremity lymphedema Neuro: nonfocal, well oriented, appropriate affect Breasts: The right breast is unremarkable.  In the left breast the mass in the upper outer quadrant is movable, measurable, perhaps 1-1/2 cm, with no skin involvement or no nipple involvement.  Both axillae are benign      LAB RESULTS:  CMP     Component Value Date/Time   NA 143 06/02/2018 1050   K 4.0 06/02/2018 1050   CL 108  06/02/2018 1050   CO2 26 06/02/2018 1050   GLUCOSE 110 (H) 06/02/2018 1050   BUN <4 (L) 06/02/2018 1050   CREATININE 0.69 06/02/2018 1050   CREATININE 0.67 03/23/2018 0855   CALCIUM 9.1 06/02/2018 1050   PROT 6.5 06/02/2018 1050   ALBUMIN 3.7 06/02/2018 1050   AST 10 (L) 06/02/2018 1050   AST  15 03/23/2018 0855   ALT 12 06/02/2018 1050   ALT 17 03/23/2018 0855   ALKPHOS 94 06/02/2018 1050   BILITOT 0.5 06/02/2018 1050   BILITOT 1.0 03/23/2018 0855   GFRNONAA >60 06/02/2018 1050   GFRNONAA >60 03/23/2018 0855   GFRAA >60 06/02/2018 1050   GFRAA >60 03/23/2018 0855    No results found for: TOTALPROTELP, ALBUMINELP, A1GS, A2GS, BETS, BETA2SER, GAMS, MSPIKE, SPEI  No results found for: KPAFRELGTCHN, LAMBDASER, [facility]  Lab Results  Component Value Date   WBC 12.2 (H) 06/02/2018   NEUTROABS 9.2 (H) 06/02/2018   HGB 10.4 (L) 06/02/2018   HCT 32.9 (L) 06/02/2018   MCV 97.6 06/02/2018   PLT 179 06/02/2018      Chemistry      Component Value Date/Time   NA 143 06/02/2018 1050   K 4.0 06/02/2018 1050   CL 108 06/02/2018 1050   CO2 26 06/02/2018 1050   BUN <4 (L) 06/02/2018 1050   CREATININE 0.69 06/02/2018 1050   CREATININE 0.67 03/23/2018 0855      Component Value Date/Time   CALCIUM 9.1 06/02/2018 1050   ALKPHOS 94 06/02/2018 1050   AST 10 (L) 06/02/2018 1050   AST 15 03/23/2018 0855   ALT 12 06/02/2018 1050   ALT 17 03/23/2018 0855   BILITOT 0.5 06/02/2018 1050   BILITOT 1.0 03/23/2018 0855       No results found for: LABCA2  No components found for: MWNUUV253  No results for input(s): INR in the last 168 hours.  No results found for: LABCA2  No results found for: CAN199  No results found for: GUY403  No results found for: KVQ259  Lab Results  Component Value Date   CA2729 213.7 (H) 04/07/2018    No components found for: HGQUANT  No results found for: CEA1 / No results found for: CEA1   No results found for: AFPTUMOR  No results found  for: [place]  No results found for: PSA1  Appointment on 06/02/2018  Component Date Value Ref Range Status  . Sodium 06/02/2018 143  135 - 145 mmol/L Final  . Potassium 06/02/2018 4.0  3.5 - 5.1 mmol/L Final  . Chloride 06/02/2018 108  98 - 111 mmol/L Final  . CO2 06/02/2018 26  22 - 32 mmol/L Final  . Glucose, Bld 06/02/2018 110* 70 - 99 mg/dL Final  . BUN 06/02/2018 <4* 6 - 20 mg/dL Final  . Creatinine, Ser 06/02/2018 0.69  0.44 - 1.00 mg/dL Final  . Calcium 06/02/2018 9.1  8.9 - 10.3 mg/dL Final  . Total Protein 06/02/2018 6.5  6.5 - 8.1 g/dL Final  . Albumin 06/02/2018 3.7  3.5 - 5.0 g/dL Final  . AST 06/02/2018 10* 15 - 41 U/L Final  . ALT 06/02/2018 12  0 - 44 U/L Final  . Alkaline Phosphatase 06/02/2018 94  38 - 126 U/L Final  . Total Bilirubin 06/02/2018 0.5  0.3 - 1.2 mg/dL Final  . GFR calc non Af Amer 06/02/2018 >60  >60 mL/min Final  . GFR calc Af Amer 06/02/2018 >60  >60 mL/min Final  . Anion gap 06/02/2018 9  5 - 15 Final   Performed at [facility], [place] [address], [place], [place] [address]  . WBC 06/02/2018 12.2* 4.0 - 10.5 K/uL Final  . RBC 06/02/2018 3.37* 3.87 - 5.11 MIL/uL Final  . Hemoglobin 06/02/2018 10.4* 12.0 - 15.0 g/dL Final  . HCT 06/02/2018 32.9* 36.0 - 46.0 % Final  .  MCV 06/02/2018 97.6  80.0 - 100.0 fL Final  . MCH 06/02/2018 30.9  26.0 - 34.0 pg Final  . MCHC 06/02/2018 31.6  30.0 - 36.0 g/dL Final  . RDW 06/02/2018 17.1* 11.5 - 15.5 % Final  . Platelets 06/02/2018 179  150 - 400 K/uL Final  . nRBC 06/02/2018 0.8* 0.0 - 0.2 % Final  . Neutrophils Relative % 06/02/2018 76  % Final  . Neutro Abs 06/02/2018 9.2* 1.7 - 7.7 K/uL Final  . Lymphocytes Relative 06/02/2018 7  % Final  . Lymphs Abs 06/02/2018 0.8  0.7 - 4.0 K/uL Final  . Monocytes Relative 06/02/2018 13  % Final  . Monocytes Absolute 06/02/2018 1.6* 0.1 - 1.0 K/uL Final  . Eosinophils Relative 06/02/2018 0  % Final  . Eosinophils Absolute 06/02/2018 0.0  0.0  - 0.5 K/uL Final  . Basophils Relative 06/02/2018 0  % Final  . Basophils Absolute 06/02/2018 0.0  0.0 - 0.1 K/uL Final  . Immature Granulocytes 06/02/2018 4  % Final  . Abs Immature Granulocytes 06/02/2018 0.48* 0.00 - 0.07 K/uL Final   Performed at [facility], [place] [address] [place], [place] [address]    (this displays the last labs from the last 3 days)  No results found for: TOTALPROTELP, ALBUMINELP, A1GS, A2GS, BETS, BETA2SER, GAMS, MSPIKE, SPEI (this displays SPEP labs)  No results found for: KPAFRELGTCHN, LAMBDASER, KAPLAMBRATIO (kappa/lambda light chains)  No results found for: HGBA, HGBA2QUANT, HGBFQUANT, HGBSQUAN (Hemoglobinopathy evaluation)   No results found for: LDH  No results found for: IRON, TIBC, IRONPCTSAT (Iron and TIBC)  No results found for: FERRITIN  Urinalysis No results found for: COLORURINE, APPEARANCEUR, LABSPEC, PHURINE, GLUCOSEU, HGBUR, BILIRUBINUR, KETONESUR, PROTEINUR, UROBILINOGEN, NITRITE, LEUKOCYTESUR   STUDIES: No results found.   ELIGIBLE FOR AVAILABLE RESEARCH PROTOCOL:  Possibly [id number]  ASSESSMENT: 48 y.o. [place] woman status post left breast overlapping site biopsy and left axillary lymph node biopsy 03/16/2018 for a clinical T2 N3, stage IIIC invasive ductal carcinoma, triple negative, with an MIB-1 of 15%   (1) Staging studies  (a) CT chest on 04/05/2018: left axillary and chronic left retropectoral adenopathy, supraclavicular adenopathy, left breast skin thickening and mass, 75m left upper lobe pulmonary nodule (repeat ct chest in 3-6 months recommended), bronchiectasis in left lower lobe.  (b) CT neck on 04/05/2018: left supraclavicular adenopathy, ? Abnormality in left frontal lobe of brain  (c) Bone scan on 04/05/2018: no findings suggestive of metastatic disease to bone  (d) MRI brain 04/07/2018: negative  (2) neoadjuvant chemotherapy consisting of doxorubicin and cyclophosphamide in dose dense fashion  x4 started 04/07/2018 and completed 05/19/2018,  followed by weekly carboplatin and paclitaxel x12 starting 06/02/2018  (3) genetics testing 11/18: no deleterious mutations.  Genes tested include: AIP, ALK, APC, ATM, AXIN2,BAP1,  BARD1, BLM, BMPR1A, BRCA1, BRCA2, BRIP1, CASR, CDC73, CDH1, CDK4, CDKN1B, CDKN1C, CDKN2A (p14ARF), CDKN2A (p16INK4a), CEBPA, CHEK2, CTNNA1, DICER1, DIS3L2, EGFR (c.2369C>T, p.Thr790Met variant only), EPCAM (Deletion/duplication testing only), FH, FLCN, GATA2, GPC3, GREM1 (Promoter region deletion/duplication testing only), HOXB13 (c.251G>A, p.Gly84Glu), HRAS, KIT, MAX, MEN1, MET, MITF (c.952G>A, p.Glu318Lys variant only), MLH1, MSH2, MSH3, MSH6, MUTYH, NBN, NF1, NF2, NTHL1, PALB2, PDGFRA, PHOX2B, PMS2, POLD1, POLE, POT1, PRKAR1A, PTCH1, PTEN, RAD50, RAD51C, RAD51D, RB1, RECQL4, RET, RUNX1, SDHAF2, SDHA (sequence changes only), SDHB, SDHC, SDHD, SMAD4, SMARCA4, SMARCB1, SMARCE1, STK11, SUFU, TERC, TERT, TMEM127, TP53, TSC1, TSC2, VHL, WRN and WT1.  (4) definitive surgery to follow  (5) adjuvant radiation  PLAN: MTinikadid generally  well with the first portion of her chemotherapy although she did have significant issues with nausea with the last treatment cycle.  She is now ready to start carboplatin and paclitaxel.  We are going to do this on a weekly basis with the AUC for carboplatin at 2.  I am keeping however the more intense antiestrogen protocol in the premeds because of the recent experience.  At home all she will need is to take Compazine the evening of treatment in the next morning and then as needed  She is concerned that the mass is still palpable in the left breast.  In less than 5% of the time the cancer can grow through chemo.  Accordingly we are setting her up for an ultrasound of the left breast to be done in the next few days.  She will see me again in 1 week.  At that time we will assess for tolerance of the current treatment and review the ultrasound  results  She knows to call for any other issues that may develop before the next visit.  Virgie Dad Magrinat MD   06/02/18 11:43 AM [facility] [address] [place], [place] [address] Tel. [phone]    Fax. [phone]   I, Wilburn Mylar, am acting as scribe for Dr. Virgie Dad. Magrinat.  I, Lurline Del MD, have reviewed the above documentation for accuracy and completeness, and I agree with the above.

## 2018-06-03 ENCOUNTER — Telehealth: Payer: Self-pay | Admitting: Oncology

## 2018-06-03 ENCOUNTER — Other Ambulatory Visit: Payer: Self-pay | Admitting: Oncology

## 2018-06-03 ENCOUNTER — Other Ambulatory Visit: Payer: BC Managed Care – PPO

## 2018-06-03 NOTE — Telephone Encounter (Signed)
No los °

## 2018-06-03 NOTE — Telephone Encounter (Signed)
Left message re 1/6 appointment at the [facility]. Also confirmed 1/9 [place] appointments.

## 2018-06-06 ENCOUNTER — Other Ambulatory Visit: Payer: BC Managed Care – PPO

## 2018-06-06 ENCOUNTER — Telehealth: Payer: Self-pay | Admitting: *Deleted

## 2018-06-06 NOTE — Telephone Encounter (Signed)
This RN returned VM left by pt this AM stating she can not make the [place] appointment scheduled for today- and is hoping to get it rescheduled to later today or tomorrow.  This RN gave pt the phone number for [facility] to reschedule.  Pt understands to call if needed including her concern " will they honor that Dr Jana Hakim wants it before Thursday ?"  This RN informed pt above request is on the order. If an issue arrives she should call to this RN.

## 2018-06-07 ENCOUNTER — Ambulatory Visit
Admission: RE | Admit: 2018-06-07 | Discharge: 2018-06-07 | Disposition: A | Discharge status: Home / Self Care | Payer: BC Managed Care – PPO | Source: Ambulatory Visit | Attending: Oncology | Admitting: Oncology

## 2018-06-07 DIAGNOSIS — Z171 Estrogen receptor negative status [ER-]: Principal | ICD-10-CM

## 2018-06-07 DIAGNOSIS — C50412 Malignant neoplasm of upper-outer quadrant of left female breast: Secondary | ICD-10-CM

## 2018-06-07 NOTE — Progress Notes (Signed)
[place]  [phone] [phone]     ID: Paula Berry DOB: 1970/10/04  MR#: [id number]  PYP#:[id number]  Patient Care Team: Vernie Shanks, MD as PCP - General (Family Medicine) Rolm Bookbinder, MD as Consulting Physician (General Surgery) Buelah Rennie, Virgie Dad, MD as Consulting Physician (Oncology) Eppie Gibson, MD as Attending Physician (Radiation Oncology) Everlene Farrier, MD as Consulting Physician (Obstetrics and Gynecology) Chauncey Cruel, MD OTHER MD:  CHIEF COMPLAINT: Triple negative breast cancer  CURRENT TREATMENT: Neoadjuvant chemotherapy: Carboplatin and paclitaxel x12   HISTORY OF CURRENT ILLNESS: From the original intake note:  The patient had a left mammogram with tomography and ultrasonography 07/12/2015 at the [facility] after screening recall for a possible left breast mass.  This found the breast density to be category C.  There was a small lobulated mass in the lower outer aspect of the left breast, which was not palpable.  Ultrasound showed a small lobulated cyst in that area measuring 0.6 cm.  This was felt to be benign.  In July 2019 she had screening mammography at Dr. Clementeen Hoof office.  I do not have that report but according to the patient it was unremarkable.  In October 2019 however the patient developed left axillary swelling and tenderness and was again set up for left mammography with tomography and left breast ultrasonography at the [facility], 03/11/2018.  Breast density was category C.  Mammography showed numerous markedly enlarged left axillary lymph nodes.  There was periareolar skin thickening.  There was an area of ill-defined distortion in the upper outer left breast and on physical exam there was a firm lump superior and lateral to the left nipple, with swelling of the left axilla.  Ultrasound of the left breast and both upper quadrants found an ill-defined irregular hypoechoic area  measuring at least 4.2 cm.  There also multiple scattered cysts.  Ultrasound of the left axilla showed at least 6 abnormal lymph nodes.  On 03/16/2018 the patient underwent right mammography with tomography and right breast ultrasonography at the [facility].  Again breast density was category C.  There were multiple circumscribed equal density masses in the upper outer quadrant of the right breast which appeared stable.  Ultrasound showed diffuse fibrocystic changes.  An additional hypoechoic mass was noted at the 9 o'clock position 3 cm from the nipple measuring 0.8 cm, with no associated vascularity.  A complex solid and cystic mass was noted superficially at the 6 o'clock position 3 cm from the nipple measuring 1.6 cm.  Evaluation of the right axilla was negative.  On 03/18/2018 the patient underwent biopsy of the 2 suspicious areas in the right breast, and they both showed only fibrocystic changes with some chronic inflammation ([id number]).  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  The patient's subsequent history is as detailed below.  INTERVAL HISTORY: Paula Berry returns today for follow-up and treatment of her triple negative breast cancer.   The patient continues on carboplatin and paclitaxel x12. Today is day 8 cycle 1 (dose #2 of 12 planned). She has mild nausea on 06/03/2018 and on 06/05/2018 without vomiting on either day. She felt some bony aches that she described as "flu like."  Since her last visit here, she underwent an ultrasound of the left breast  on 06/07/2018 showing: Due to the ill-defined irregular appearance of the known malignancy in the left breast, accurate assessment of response to neoadjuvant chemotherapy based on size is difficult. The mass does  appear overall less dense. Marked decrease in size of the bulky left axillary lymphadenopathy.    REVIEW OF SYSTEMS: Paula Berry has had daily bowel movements, but they have been slightly firm. On 06/06/2018, she had some difficult sleeping, and then on 06/07/2018 she took two Tylenol for breast discomfort. She has had no neuropathy. The patient denies unusual headaches, visual changes, vomiting, or dizziness. There has been no unusual cough, phlegm production, or pleurisy. This been no change in bowel or bladder habits. The patient denies unexplained fatigue or unexplained weight loss, bleeding, rash, or fever. A detailed review of systems was otherwise noncontributory.     PAST MEDICAL HISTORY: Past Medical History:  Diagnosis Date  . Anemia    had iron infusion 08/12/15  . Asthma   . Cancer [facility])    Breast cancer   . Genital herpes   . GERD (gastroesophageal reflux disease)   . Headache    migraines 2-3 times monthly  . Pneumonia    hospitalizied for pneumonia as a child    PAST SURGICAL HISTORY: Past Surgical History:  Procedure Laterality Date  . ABDOMINAL HYSTERECTOMY Bilateral 08/22/2015   Procedure: HYSTERECTOMY ABDOMINAL, BILATERAL SALPINGECTOMY;  Surgeon: Everlene Farrier, MD;  Location: [facility];  Service: Gynecology;  Laterality: Bilateral;  . MYOMECTOMY ABDOMINAL APPROACH    . PORTACATH PLACEMENT N/A 03/30/2018   Procedure: INSERTION PORT-A-CATH WITH [place];  Surgeon: Rolm Bookbinder, MD;  Location: [facility];  Service: General;  Laterality: N/A;    FAMILY HISTORY Family History  Problem Relation Age of Onset  . Hypertension Unknown   . Diabetes Cousin        mat first cousin  . Cancer Unknown   . Dementia Maternal Grandfather   . Lung cancer Paternal Grandmother        d. 69-60  . Brain cancer Paternal Aunt        d. 46, father's mat 1/2 sister  . Diabetes Maternal Grandmother   . Other Paternal Grandfather        d. WWII  . Aneurysm Maternal Uncle        brain  .  Migraines Neg Hx   The patient's parents are in their early 55s as of October 2019.  A paternal aunt had brain cancer at age 65.  A paternal grandmother had lung cancer at age 3.  There is no breast or ovarian cancer history in the family.  However note that the patient has little information on her mother side of the family.  GYNECOLOGIC HISTORY:  No LMP recorded. Patient has had a hysterectomy. 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  Her husband [name]. works for the [employer].  He is a former patient here, with stage IV colon cancer.  They live alone, with no pets.     ADVANCED DIRECTIVES: Not in place   HEALTH MAINTENANCE: Social History   Tobacco Use  . Smoking status: Never Smoker  . Smokeless tobacco: Never Used  Substance Use Topics  . Alcohol use: Yes    Alcohol/week:  1.0 standard drinks    Types: 1 Glasses of wine per week    Comment: daily  . Drug use: No     Colonoscopy: Never  PAP: Status post hysterectomy  Bone density: Never   Allergies  Allergen Reactions  . Neosporin [Neomycin-Bacitracin Zn-Polymyx] Other (See Comments)    Decreased wound healing with blisters    Current Outpatient Medications  Medication Sig Dispense Refill  . acetaminophen (TYLENOL) 500 MG tablet Take 1,000 mg by mouth every 8 (eight) hours as needed for moderate pain.    Marland Kitchen acyclovir ointment (ZOVIRAX) 5 % Apply 1 application topically 4 (four) times daily as needed (outbreaks).   2  . albuterol (PROVENTIL HFA;VENTOLIN HFA) 108 (90 Base) MCG/ACT inhaler Inhale 2 puffs into the lungs every 6 (six) hours as needed for wheezing or shortness of breath.     . ciprofloxacin (CIPRO) 500 MG tablet Take 1 tablet (500  mg total) by mouth 2 (two) times daily. 14 tablet 0  . dexamethasone (DECADRON) 4 MG tablet Take 2 tablets by mouth once a day on the day after chemotherapy and then take 2 tablets two times a day for 2 days. Take with food. 30 tablet 1  . lidocaine-prilocaine (EMLA) cream Apply to affected area once 30 g 3  . LORazepam (ATIVAN) 0.5 MG tablet Take 1 tablet (0.5 mg total) by mouth at bedtime as needed (Nausea or vomiting). 30 tablet 0  . methocarbamol (ROBAXIN) 500 MG tablet Take 500 mg by mouth 3 (three) times daily as needed for muscle spasms (pain).    . montelukast (SINGULAIR) 10 MG tablet Take 10 mg by mouth every evening.    Marland Kitchen oxyCODONE (OXY IR/ROXICODONE) 5 MG immediate release tablet Take 1 tablet (5 mg total) by mouth every 6 (six) hours as needed for moderate pain, severe pain or breakthrough pain. 8 tablet 0  . pantoprazole (PROTONIX) 40 MG tablet Take 40 mg by mouth every evening.    . prochlorperazine (COMPAZINE) 10 MG tablet Take 1 tablet (10 mg total) by mouth every 6 (six) hours as needed (Nausea or vomiting). 30 tablet 1  . SYMBICORT 160-4.5 MCG/ACT inhaler INL 2 PFS PO BID  3  . valACYclovir (VALTREX) 500 MG tablet Take 500 mg by mouth See admin instructions. Take 500 mg twice daily for 3 days at onset of outbreak then take 500 mg once daily until clear     No current facility-administered medications for this visit.     OBJECTIVE: Middle-aged African-American woman in no acute distress Vitals:   06/09/18 0941  BP: 106/61  Pulse: (!) 104  Resp: 18  Temp: 98.3 F (36.8 C)  SpO2: 100%     Body mass index is 30.35 kg/m.   Wt Readings from Last 3 Encounters:  06/09/18 205 lb 8 oz (93.2 kg)  06/02/18 207 lb 4.8 oz (94 kg)  05/27/18 202 lb 6.4 oz (91.8 kg)  ECOG FS:0  Sclerae unicteric, pupils round and equal Oropharynx clear, slightly dry No cervical or supraclavicular adenopathy Lungs no rales or rhonchi Heart regular rate and rhythm Abd soft, nontender, positive  bowel sounds MSK no focal spinal tenderness, no upper extremity lymphedema Neuro: nonfocal, well oriented, appropriate affect Breasts: The right breast is benign.  I did not feel a well-defined mass in the upper outer quadrant of the left breast today.  There is no skin change.   LAB RESULTS:  CMP     Component Value Date/Time   NA 143 06/02/2018  1050   K 4.0 06/02/2018 1050   CL 108 06/02/2018 1050   CO2 26 06/02/2018 1050   GLUCOSE 110 (H) 06/02/2018 1050   BUN <4 (L) 06/02/2018 1050   CREATININE 0.69 06/02/2018 1050   CREATININE 0.67 03/23/2018 0855   CALCIUM 9.1 06/02/2018 1050   PROT 6.5 06/02/2018 1050   ALBUMIN 3.7 06/02/2018 1050   AST 10 (L) 06/02/2018 1050   AST 15 03/23/2018 0855   ALT 12 06/02/2018 1050   ALT 17 03/23/2018 0855   ALKPHOS 94 06/02/2018 1050   BILITOT 0.5 06/02/2018 1050   BILITOT 1.0 03/23/2018 0855   GFRNONAA >60 06/02/2018 1050   GFRNONAA >60 03/23/2018 0855   GFRAA >60 06/02/2018 1050   GFRAA >60 03/23/2018 0855    No results found for: TOTALPROTELP, ALBUMINELP, A1GS, A2GS, BETS, BETA2SER, GAMS, MSPIKE, SPEI  No results found for: Nils Pyle, [facility]  Lab Results  Component Value Date   WBC 5.5 06/09/2018   NEUTROABS 4.0 06/09/2018   HGB 10.7 (L) 06/09/2018   HCT 32.9 (L) 06/09/2018   MCV 96.2 06/09/2018   PLT 302 06/09/2018      Chemistry      Component Value Date/Time   NA 143 06/02/2018 1050   K 4.0 06/02/2018 1050   CL 108 06/02/2018 1050   CO2 26 06/02/2018 1050   BUN <4 (L) 06/02/2018 1050   CREATININE 0.69 06/02/2018 1050   CREATININE 0.67 03/23/2018 0855      Component Value Date/Time   CALCIUM 9.1 06/02/2018 1050   ALKPHOS 94 06/02/2018 1050   AST 10 (L) 06/02/2018 1050   AST 15 03/23/2018 0855   ALT 12 06/02/2018 1050   ALT 17 03/23/2018 0855   BILITOT 0.5 06/02/2018 1050   BILITOT 1.0 03/23/2018 0855       No results found for: LABCA2  No components found for: [id number]  No results  for input(s): INR in the last 168 hours.  No results found for: LABCA2  No results found for: CAN199  No results found for: JQZ009  No results found for: QZR007  Lab Results  Component Value Date   CA2729 213.7 (H) 04/07/2018    No components found for: HGQUANT  No results found for: CEA1 / No results found for: CEA1   No results found for: AFPTUMOR  No results found for: [place]  No results found for: PSA1  Appointment on 06/09/2018  Component Date Value Ref Range Status  . WBC 06/09/2018 5.5  4.0 - 10.5 K/uL Final  . RBC 06/09/2018 3.42* 3.87 - 5.11 MIL/uL Final  . Hemoglobin 06/09/2018 10.7* 12.0 - 15.0 g/dL Final  . HCT 06/09/2018 32.9* 36.0 - 46.0 % Final  . MCV 06/09/2018 96.2  80.0 - 100.0 fL Final  . MCH 06/09/2018 31.3  26.0 - 34.0 pg Final  . MCHC 06/09/2018 32.5  30.0 - 36.0 g/dL Final  . RDW 06/09/2018 16.9* 11.5 - 15.5 % Final  . Platelets 06/09/2018 302  150 - 400 K/uL Final  . nRBC 06/09/2018 0.0  0.0 - 0.2 % Final  . Neutrophils Relative % 06/09/2018 72  % Final  . Neutro Abs 06/09/2018 4.0  1.7 - 7.7 K/uL Final  . Lymphocytes Relative 06/09/2018 13  % Final  . Lymphs Abs 06/09/2018 0.7  0.7 - 4.0 K/uL Final  . Monocytes Relative 06/09/2018 12  % Final  . Monocytes Absolute 06/09/2018 0.7  0.1 - 1.0 K/uL Final  . Eosinophils Relative 06/09/2018 1  %  Final  . Eosinophils Absolute 06/09/2018 0.0  0.0 - 0.5 K/uL Final  . Basophils Relative 06/09/2018 1  % Final  . Basophils Absolute 06/09/2018 0.0  0.0 - 0.1 K/uL Final  . Immature Granulocytes 06/09/2018 1  % Final  . Abs Immature Granulocytes 06/09/2018 0.04  0.00 - 0.07 K/uL Final   Performed at [facility], [place] [address], [place], [place] [address]    (this displays the last labs from the last 3 days)  No results found for: TOTALPROTELP, ALBUMINELP, A1GS, A2GS, BETS, BETA2SER, GAMS, MSPIKE, SPEI (this displays SPEP labs)  No results found for: KPAFRELGTCHN,  LAMBDASER, KAPLAMBRATIO (kappa/lambda light chains)  No results found for: HGBA, HGBA2QUANT, HGBFQUANT, HGBSQUAN (Hemoglobinopathy evaluation)   No results found for: LDH  No results found for: IRON, TIBC, IRONPCTSAT (Iron and TIBC)  No results found for: FERRITIN  Urinalysis No results found for: COLORURINE, APPEARANCEUR, LABSPEC, PHURINE, GLUCOSEU, HGBUR, BILIRUBINUR, KETONESUR, PROTEINUR, UROBILINOGEN, NITRITE, LEUKOCYTESUR   STUDIES: US Breast Ltd Uni Left Inc Axilla  Result Date: 06/07/2018 CLINICAL DATA:  48 year old female presenting for ultrasound evaluation of the known malignancy in her left breast. She is currently undergoing neoadjuvant chemotherapy and is having new pain in the left breast. She feels that the swelling and fullness of her left axilla has significantly improved. EXAM: ULTRASOUND OF THE LEFT BREAST COMPARISON:  Previous exam(s). FINDINGS: Ultrasound of the superior left breast demonstrates the area of biopsy at 1 o'clock, 1 cm from the nipple. There is also persistent ill-defined hypoechoic tissue in the superior left breast at 12 o'clock 1-2 cm from the nipple. Due to the ill-defined nature of the mass is difficult to assess difference in size, however visually the mass does appear less dense than on prior exam. Ultrasound of the left axilla demonstrates several thickened lymph nodes, which have overall significantly decreased in size. The biopsied lymph node in the left axilla is seen, whose cortex now measures 0.4 cm, previously 1.3 cm. IMPRESSION: 1. Due to the ill-defined irregular appearance of the known malignancy in the left breast, accurate assessment of response to neoadjuvant chemotherapy based on size is difficult. The mass does appear overall less dense. 2. Marked decrease in size of the bulky left axillary lymphadenopathy. RECOMMENDATION: If a more accurate assessment of the differences size is desired to determine response to neoadjuvant chemotherapy, MRI  is recommended. I have discussed the findings and recommendations with the patient. Results were also provided in writing at the conclusion of the visit. If applicable, a reminder letter will be sent to the patient regarding the next appointment. BI-RADS CATEGORY  6: Known biopsy-proven malignancy. Electronically Signed   By: Ammie Ferrier M.D.   On: 06/07/2018 13:59     ELIGIBLE FOR AVAILABLE RESEARCH PROTOCOL:  Possibly [id number]  ASSESSMENT: 48 y.o. [place] woman status post left breast overlapping site biopsy and left axillary lymph node biopsy 03/16/2018 for a clinical T2 N3, stage IIIC invasive ductal carcinoma, triple negative, with an MIB-1 of 15%   (1) Staging studies  (a) CT chest on 04/05/2018: left axillary and chronic left retropectoral adenopathy, supraclavicular adenopathy, left breast skin thickening and mass, 63m left upper lobe pulmonary nodule (repeat ct chest in 3-6 months recommended), bronchiectasis in left lower lobe.  (b) CT neck on 04/05/2018: left supraclavicular adenopathy, ? Abnormality in left frontal lobe of brain  (c) Bone scan on 04/05/2018: no findings suggestive of metastatic disease to bone  (d) MRI brain 04/07/2018: negative  (2)  neoadjuvant chemotherapy consisting of doxorubicin and cyclophosphamide in dose dense fashion x4 started 04/07/2018 and completed 05/19/2018,  followed by weekly carboplatin and paclitaxel x12 starting 06/02/2018  (3) genetics testing 11/18: no deleterious mutations.  Genes tested include: AIP, ALK, APC, ATM, AXIN2,BAP1,  BARD1, BLM, BMPR1A, BRCA1, BRCA2, BRIP1, CASR, CDC73, CDH1, CDK4, CDKN1B, CDKN1C, CDKN2A (p14ARF), CDKN2A (p16INK4a), CEBPA, CHEK2, CTNNA1, DICER1, DIS3L2, EGFR (c.2369C>T, p.Thr790Met variant only), EPCAM (Deletion/duplication testing only), FH, FLCN, GATA2, GPC3, GREM1 (Promoter region deletion/duplication testing only), HOXB13 (c.251G>A, p.Gly84Glu), HRAS, KIT, MAX, MEN1, MET, MITF (c.952G>A, p.Glu318Lys variant only),  MLH1, MSH2, MSH3, MSH6, MUTYH, NBN, NF1, NF2, NTHL1, PALB2, PDGFRA, PHOX2B, PMS2, POLD1, POLE, POT1, PRKAR1A, PTCH1, PTEN, RAD50, RAD51C, RAD51D, RB1, RECQL4, RET, RUNX1, SDHAF2, SDHA (sequence changes only), SDHB, SDHC, SDHD, SMAD4, SMARCA4, SMARCB1, SMARCE1, STK11, SUFU, TERC, TERT, TMEM127, TP53, TSC1, TSC2, VHL, WRN and WT1.  (4) definitive surgery to follow  (5) adjuvant radiation   PLAN: Paula Berry tolerated her first dose of carboplatin and paclitaxel well.  She did have some bony aches and a slight fluid feeling but this was minimal.  She had a little bit of queasiness which she easily controlled with Compazine.  I reassured her that her second dose, today, will be easier.  She will be treated weekly and we are going to start seeing her with every other treatment until she gets to her eighth cycle at which point we will start seeing her weekly.  The point of course is to catch neuropathy as early as possible if it develops so that it does not become a permanent issue.  I have encouraged her to be as active as possible through her treatments.  She knows to call for any other issue that may develop before the next scheduled visit.   Pam Vanalstine, Virgie Dad, MD  06/09/18 10:01 AM Medical Oncology and Hematology [facility] [address] [place], [place] [address] Tel. [phone]    Fax. [phone]   I, Jacqualyn Posey am acting as a Education administrator for Chauncey Cruel, MD.   I, Lurline Del MD, have reviewed the above documentation for accuracy and completeness, and I agree with the above.

## 2018-06-09 ENCOUNTER — Ambulatory Visit: Payer: BC Managed Care – PPO

## 2018-06-09 ENCOUNTER — Inpatient Hospital Stay (HOSPITAL_BASED_OUTPATIENT_CLINIC_OR_DEPARTMENT_OTHER): Payer: BC Managed Care – PPO | Admitting: Oncology

## 2018-06-09 ENCOUNTER — Inpatient Hospital Stay: Payer: BC Managed Care – PPO

## 2018-06-09 ENCOUNTER — Telehealth: Payer: Self-pay | Admitting: Oncology

## 2018-06-09 VITALS — BP 106/61 | HR 104 | Temp 98.3°F | Resp 18 | Ht 69.0 in | Wt 205.5 lb

## 2018-06-09 DIAGNOSIS — M791 Myalgia, unspecified site: Secondary | ICD-10-CM | POA: Diagnosis not present

## 2018-06-09 DIAGNOSIS — C773 Secondary and unspecified malignant neoplasm of axilla and upper limb lymph nodes: Secondary | ICD-10-CM

## 2018-06-09 DIAGNOSIS — Z95828 Presence of other vascular implants and grafts: Secondary | ICD-10-CM

## 2018-06-09 DIAGNOSIS — Z79899 Other long term (current) drug therapy: Secondary | ICD-10-CM

## 2018-06-09 DIAGNOSIS — Z808 Family history of malignant neoplasm of other organs or systems: Secondary | ICD-10-CM

## 2018-06-09 DIAGNOSIS — Z171 Estrogen receptor negative status [ER-]: Principal | ICD-10-CM

## 2018-06-09 DIAGNOSIS — Z90722 Acquired absence of ovaries, bilateral: Secondary | ICD-10-CM

## 2018-06-09 DIAGNOSIS — K219 Gastro-esophageal reflux disease without esophagitis: Secondary | ICD-10-CM

## 2018-06-09 DIAGNOSIS — C50412 Malignant neoplasm of upper-outer quadrant of left female breast: Secondary | ICD-10-CM | POA: Diagnosis not present

## 2018-06-09 DIAGNOSIS — Z801 Family history of malignant neoplasm of trachea, bronchus and lung: Secondary | ICD-10-CM

## 2018-06-09 DIAGNOSIS — Z90710 Acquired absence of both cervix and uterus: Secondary | ICD-10-CM

## 2018-06-09 LAB — COMPREHENSIVE METABOLIC PANEL
ALT: 15 U/L (ref 0–44)
AST: 14 U/L — ABNORMAL LOW (ref 15–41)
Albumin: 3.8 g/dL (ref 3.5–5.0)
Alkaline Phosphatase: 67 U/L (ref 38–126)
Anion gap: 10 (ref 5–15)
BUN: 7 mg/dL (ref 6–20)
CO2: 24 mmol/L (ref 22–32)
Calcium: 9.4 mg/dL (ref 8.9–10.3)
Chloride: 107 mmol/L (ref 98–111)
Creatinine, Ser: 0.64 mg/dL (ref 0.44–1.00)
GFR calc Af Amer: >60 mL/min (ref >60)
GFR calc non Af Amer: >60 mL/min (ref >60)
Glucose, Bld: 104 mg/dL — ABNORMAL HIGH (ref 70–99)
Potassium: 4.2 mmol/L (ref 3.5–5.1)
Sodium: 141 mmol/L (ref 135–145)
Total Bilirubin: 0.6 mg/dL (ref 0.3–1.2)
Total Protein: 6.7 g/dL (ref 6.5–8.1)

## 2018-06-09 LAB — CBC WITH DIFFERENTIAL/PLATELET
Abs Immature Granulocytes: 0.04 10*3/uL (ref 0.00–0.07)
Basophils Absolute: 0 10*3/uL (ref 0.0–0.1)
Basophils Relative: 1 %
EOS ABS: 0 10*3/uL (ref 0.0–0.5)
EOS PCT: 1 %
HEMATOCRIT: 32.9 % — AB (ref 36.0–46.0)
HEMOGLOBIN: 10.7 g/dL — AB (ref 12.0–15.0)
Immature Granulocytes: 1 %
LYMPHS ABS: 0.7 10*3/uL (ref 0.7–4.0)
LYMPHS PCT: 13 %
MCH: 31.3 pg (ref 26.0–34.0)
MCHC: 32.5 g/dL (ref 30.0–36.0)
MCV: 96.2 fL (ref 80.0–100.0)
Monocytes Absolute: 0.7 10*3/uL (ref 0.1–1.0)
Monocytes Relative: 12 %
Neutro Abs: 4 10*3/uL (ref 1.7–7.7)
Neutrophils Relative %: 72 %
Platelets: 302 10*3/uL (ref 150–400)
RBC: 3.42 MIL/uL — ABNORMAL LOW (ref 3.87–5.11)
RDW: 16.9 % — AB (ref 11.5–15.5)
WBC: 5.5 10*3/uL (ref 4.0–10.5)
nRBC: 0 % (ref 0.0–0.2)

## 2018-06-09 MED ORDER — PALONOSETRON HCL INJECTION 0.25 MG/5ML
INTRAVENOUS | Status: AC
  Filled 2018-06-09: qty 5

## 2018-06-09 MED ORDER — SODIUM CHLORIDE 0.9% FLUSH
10.0000 mL | INTRAVENOUS | Status: DC | PRN
  Administered 2018-06-09: 10 mL
  Filled 2018-06-09: qty 10

## 2018-06-09 MED ORDER — FAMOTIDINE IN NACL 20-0.9 MG/50ML-% IV SOLN
20.0000 mg | Freq: Once | INTRAVENOUS | Status: AC
  Administered 2018-06-09: 20 mg via INTRAVENOUS

## 2018-06-09 MED ORDER — DIPHENHYDRAMINE HCL 50 MG/ML IJ SOLN
INTRAMUSCULAR | Status: AC
  Filled 2018-06-09: qty 1

## 2018-06-09 MED ORDER — SODIUM CHLORIDE 0.9 % IV SOLN
80.0000 mg/m2 | Freq: Once | INTRAVENOUS | Status: AC
  Administered 2018-06-09: 174 mg via INTRAVENOUS
  Filled 2018-06-09: qty 29

## 2018-06-09 MED ORDER — HEPARIN SOD (PORK) LOCK FLUSH 100 UNIT/ML IV SOLN
500.0000 [IU] | Freq: Once | INTRAVENOUS | Status: AC | PRN
  Administered 2018-06-09: 500 [IU]
  Filled 2018-06-09: qty 5

## 2018-06-09 MED ORDER — SODIUM CHLORIDE 0.9 % IV SOLN
Freq: Once | INTRAVENOUS | Status: AC
  Administered 2018-06-09: 11:00:00 via INTRAVENOUS
  Filled 2018-06-09: qty 5

## 2018-06-09 MED ORDER — SODIUM CHLORIDE 0.9 % IV SOLN
Freq: Once | INTRAVENOUS | Status: AC
  Administered 2018-06-09: 11:00:00 via INTRAVENOUS
  Filled 2018-06-09: qty 250

## 2018-06-09 MED ORDER — DIPHENHYDRAMINE HCL 50 MG/ML IJ SOLN
25.0000 mg | Freq: Once | INTRAMUSCULAR | Status: AC
  Administered 2018-06-09: 25 mg via INTRAVENOUS

## 2018-06-09 MED ORDER — SODIUM CHLORIDE 0.9 % IV SOLN
300.0000 mg | Freq: Once | INTRAVENOUS | Status: AC
  Administered 2018-06-09: 300 mg via INTRAVENOUS
  Filled 2018-06-09: qty 30

## 2018-06-09 MED ORDER — FAMOTIDINE IN NACL 20-0.9 MG/50ML-% IV SOLN
INTRAVENOUS | Status: AC
  Filled 2018-06-09: qty 50

## 2018-06-09 MED ORDER — PALONOSETRON HCL INJECTION 0.25 MG/5ML
0.2500 mg | Freq: Once | INTRAVENOUS | Status: AC
  Administered 2018-06-09: 0.25 mg via INTRAVENOUS

## 2018-06-09 MED ORDER — SODIUM CHLORIDE 0.9% FLUSH
10.0000 mL | Freq: Once | INTRAVENOUS | Status: AC
  Administered 2018-06-09: 10 mL
  Filled 2018-06-09: qty 10

## 2018-06-09 NOTE — Patient Instructions (Signed)
[facility] Discharge Instructions for Patients Receiving Chemotherapy  Today you received the following chemotherapy agents:  Taxol, Carboplatin  To help prevent nausea and vomiting after your treatment, we encourage you to take your nausea medication as prescribed.   If you develop nausea and vomiting that is not controlled by your nausea medication, call the clinic.   BELOW ARE SYMPTOMS THAT SHOULD BE REPORTED IMMEDIATELY:  *FEVER GREATER THAN 100.5 F  *CHILLS WITH OR WITHOUT FEVER  NAUSEA AND VOMITING THAT IS NOT CONTROLLED WITH YOUR NAUSEA MEDICATION  *UNUSUAL SHORTNESS OF BREATH  *UNUSUAL BRUISING OR BLEEDING  TENDERNESS IN MOUTH AND THROAT WITH OR WITHOUT PRESENCE OF ULCERS  *URINARY PROBLEMS  *BOWEL PROBLEMS  UNUSUAL RASH Items with * indicate a potential emergency and should be followed up as soon as possible.  Feel free to call the clinic should you have any questions or concerns. The clinic phone number is [phone].  Please show the CHEMO ALERT CARD at check-in to the Emergency Department and triage nurse.   

## 2018-06-09 NOTE — Telephone Encounter (Signed)
Gave avs and calendar ° °

## 2018-06-16 ENCOUNTER — Inpatient Hospital Stay: Payer: BC Managed Care – PPO

## 2018-06-16 ENCOUNTER — Other Ambulatory Visit: Payer: Self-pay | Admitting: Hematology and Oncology

## 2018-06-16 ENCOUNTER — Ambulatory Visit: Payer: BC Managed Care – PPO

## 2018-06-16 VITALS — BP 112/75 | HR 100 | Temp 98.8°F | Resp 17

## 2018-06-16 DIAGNOSIS — Z171 Estrogen receptor negative status [ER-]: Principal | ICD-10-CM

## 2018-06-16 DIAGNOSIS — C50412 Malignant neoplasm of upper-outer quadrant of left female breast: Secondary | ICD-10-CM

## 2018-06-16 DIAGNOSIS — Z95828 Presence of other vascular implants and grafts: Secondary | ICD-10-CM

## 2018-06-16 LAB — COMPREHENSIVE METABOLIC PANEL
ALT: 12 U/L (ref 0–44)
AST: 13 U/L — AB (ref 15–41)
Albumin: 3.8 g/dL (ref 3.5–5.0)
Alkaline Phosphatase: 61 U/L (ref 38–126)
Anion gap: 9 (ref 5–15)
BUN: 6 mg/dL (ref 6–20)
CO2: 25 mmol/L (ref 22–32)
Calcium: 9.2 mg/dL (ref 8.9–10.3)
Chloride: 108 mmol/L (ref 98–111)
Creatinine, Ser: 0.57 mg/dL (ref 0.44–1.00)
GFR calc Af Amer: >60 mL/min (ref >60)
GFR calc non Af Amer: >60 mL/min (ref >60)
Glucose, Bld: 96 mg/dL (ref 70–99)
Potassium: 3.9 mmol/L (ref 3.5–5.1)
Sodium: 142 mmol/L (ref 135–145)
Total Bilirubin: 0.7 mg/dL (ref 0.3–1.2)
Total Protein: 6.6 g/dL (ref 6.5–8.1)

## 2018-06-16 LAB — CBC WITH DIFFERENTIAL/PLATELET
Abs Immature Granulocytes: 0.02 10*3/uL (ref 0.00–0.07)
BASOS ABS: 0 10*3/uL (ref 0.0–0.1)
Basophils Relative: 1 %
Eosinophils Absolute: 0.1 10*3/uL (ref 0.0–0.5)
Eosinophils Relative: 2 %
HCT: 32.7 % — ABNORMAL LOW (ref 36.0–46.0)
HEMOGLOBIN: 10.7 g/dL — AB (ref 12.0–15.0)
Immature Granulocytes: 1 %
LYMPHS PCT: 21 %
Lymphs Abs: 0.6 10*3/uL — ABNORMAL LOW (ref 0.7–4.0)
MCH: 31.8 pg (ref 26.0–34.0)
MCHC: 32.7 g/dL (ref 30.0–36.0)
MCV: 97 fL (ref 80.0–100.0)
Monocytes Absolute: 0.5 10*3/uL (ref 0.1–1.0)
Monocytes Relative: 17 %
Neutro Abs: 1.6 10*3/uL — ABNORMAL LOW (ref 1.7–7.7)
Neutrophils Relative %: 58 %
Platelets: 262 10*3/uL (ref 150–400)
RBC: 3.37 MIL/uL — AB (ref 3.87–5.11)
RDW: 17.7 % — ABNORMAL HIGH (ref 11.5–15.5)
WBC: 2.7 10*3/uL — ABNORMAL LOW (ref 4.0–10.5)
nRBC: 0 % (ref 0.0–0.2)

## 2018-06-16 MED ORDER — SODIUM CHLORIDE 0.9 % IV SOLN
300.0000 mg | Freq: Once | INTRAVENOUS | Status: AC
  Administered 2018-06-16: 300 mg via INTRAVENOUS
  Filled 2018-06-16: qty 30

## 2018-06-16 MED ORDER — SODIUM CHLORIDE 0.9 % IV SOLN
80.0000 mg/m2 | Freq: Once | INTRAVENOUS | Status: AC
  Administered 2018-06-16: 174 mg via INTRAVENOUS
  Filled 2018-06-16: qty 29

## 2018-06-16 MED ORDER — FAMOTIDINE IN NACL 20-0.9 MG/50ML-% IV SOLN
20.0000 mg | Freq: Once | INTRAVENOUS | Status: AC
  Administered 2018-06-16: 20 mg via INTRAVENOUS

## 2018-06-16 MED ORDER — SODIUM CHLORIDE 0.9% FLUSH
10.0000 mL | Freq: Once | INTRAVENOUS | Status: AC
  Administered 2018-06-16: 10 mL
  Filled 2018-06-16: qty 10

## 2018-06-16 MED ORDER — DIPHENHYDRAMINE HCL 50 MG/ML IJ SOLN
INTRAMUSCULAR | Status: AC
  Filled 2018-06-16: qty 1

## 2018-06-16 MED ORDER — SODIUM CHLORIDE 0.9 % IV SOLN
Freq: Once | INTRAVENOUS | Status: AC
  Administered 2018-06-16: 10:00:00 via INTRAVENOUS
  Filled 2018-06-16: qty 250

## 2018-06-16 MED ORDER — SODIUM CHLORIDE 0.9% FLUSH
10.0000 mL | INTRAVENOUS | Status: DC | PRN
  Administered 2018-06-16: 10 mL
  Filled 2018-06-16: qty 10

## 2018-06-16 MED ORDER — PALONOSETRON HCL INJECTION 0.25 MG/5ML
INTRAVENOUS | Status: AC
  Filled 2018-06-16: qty 5

## 2018-06-16 MED ORDER — HEPARIN SOD (PORK) LOCK FLUSH 100 UNIT/ML IV SOLN
500.0000 [IU] | Freq: Once | INTRAVENOUS | Status: AC | PRN
  Administered 2018-06-16: 500 [IU]
  Filled 2018-06-16: qty 5

## 2018-06-16 MED ORDER — FAMOTIDINE IN NACL 20-0.9 MG/50ML-% IV SOLN
INTRAVENOUS | Status: AC
  Filled 2018-06-16: qty 50

## 2018-06-16 MED ORDER — PALONOSETRON HCL INJECTION 0.25 MG/5ML
0.2500 mg | Freq: Once | INTRAVENOUS | Status: AC
  Administered 2018-06-16: 0.25 mg via INTRAVENOUS

## 2018-06-16 MED ORDER — DIPHENHYDRAMINE HCL 50 MG/ML IJ SOLN
25.0000 mg | Freq: Once | INTRAMUSCULAR | Status: AC
  Administered 2018-06-16: 25 mg via INTRAVENOUS

## 2018-06-16 MED ORDER — SODIUM CHLORIDE 0.9 % IV SOLN
Freq: Once | INTRAVENOUS | Status: AC
  Administered 2018-06-16: 10:00:00 via INTRAVENOUS
  Filled 2018-06-16: qty 5

## 2018-06-16 NOTE — Patient Instructions (Signed)
[facility] Discharge Instructions for Patients Receiving Chemotherapy  Today you received the following chemotherapy agents Taxol, Carboplatin  To help prevent nausea and vomiting after your treatment, we encourage you to take your nausea medication as directed  If you develop nausea and vomiting that is not controlled by your nausea medication, call the clinic.   BELOW ARE SYMPTOMS THAT SHOULD BE REPORTED IMMEDIATELY:  *FEVER GREATER THAN 100.5 F  *CHILLS WITH OR WITHOUT FEVER  NAUSEA AND VOMITING THAT IS NOT CONTROLLED WITH YOUR NAUSEA MEDICATION  *UNUSUAL SHORTNESS OF BREATH  *UNUSUAL BRUISING OR BLEEDING  TENDERNESS IN MOUTH AND THROAT WITH OR WITHOUT PRESENCE OF ULCERS  *URINARY PROBLEMS  *BOWEL PROBLEMS  UNUSUAL RASH Items with * indicate a potential emergency and should be followed up as soon as possible.  Feel free to call the clinic should you have any questions or concerns. The clinic phone number is [phone].  Please show the CHEMO ALERT CARD at check-in to the Emergency Department and triage nurse.   

## 2018-06-23 ENCOUNTER — Inpatient Hospital Stay (HOSPITAL_BASED_OUTPATIENT_CLINIC_OR_DEPARTMENT_OTHER): Payer: BC Managed Care – PPO | Admitting: Adult Health

## 2018-06-23 ENCOUNTER — Encounter: Payer: Self-pay | Admitting: Adult Health

## 2018-06-23 ENCOUNTER — Telehealth: Payer: Self-pay | Admitting: Adult Health

## 2018-06-23 ENCOUNTER — Inpatient Hospital Stay: Payer: BC Managed Care – PPO

## 2018-06-23 ENCOUNTER — Ambulatory Visit: Payer: BC Managed Care – PPO

## 2018-06-23 VITALS — BP 111/68 | HR 99 | Temp 98.1°F | Resp 18 | Ht 69.0 in | Wt 207.3 lb

## 2018-06-23 DIAGNOSIS — C50412 Malignant neoplasm of upper-outer quadrant of left female breast: Secondary | ICD-10-CM

## 2018-06-23 DIAGNOSIS — C773 Secondary and unspecified malignant neoplasm of axilla and upper limb lymph nodes: Secondary | ICD-10-CM | POA: Diagnosis not present

## 2018-06-23 DIAGNOSIS — R110 Nausea: Secondary | ICD-10-CM

## 2018-06-23 DIAGNOSIS — Z90722 Acquired absence of ovaries, bilateral: Secondary | ICD-10-CM

## 2018-06-23 DIAGNOSIS — K219 Gastro-esophageal reflux disease without esophagitis: Secondary | ICD-10-CM

## 2018-06-23 DIAGNOSIS — Z801 Family history of malignant neoplasm of trachea, bronchus and lung: Secondary | ICD-10-CM

## 2018-06-23 DIAGNOSIS — Z79899 Other long term (current) drug therapy: Secondary | ICD-10-CM

## 2018-06-23 DIAGNOSIS — R5383 Other fatigue: Secondary | ICD-10-CM

## 2018-06-23 DIAGNOSIS — Z90710 Acquired absence of both cervix and uterus: Secondary | ICD-10-CM

## 2018-06-23 DIAGNOSIS — Z171 Estrogen receptor negative status [ER-]: Principal | ICD-10-CM

## 2018-06-23 DIAGNOSIS — Z808 Family history of malignant neoplasm of other organs or systems: Secondary | ICD-10-CM

## 2018-06-23 DIAGNOSIS — K5900 Constipation, unspecified: Secondary | ICD-10-CM

## 2018-06-23 LAB — CBC WITH DIFFERENTIAL/PLATELET
Abs Immature Granulocytes: 0.02 10*3/uL (ref 0.00–0.07)
Basophils Absolute: 0 10*3/uL (ref 0.0–0.1)
Basophils Relative: 1 %
Eosinophils Absolute: 0.1 10*3/uL (ref 0.0–0.5)
Eosinophils Relative: 2 %
HCT: 33.4 % — ABNORMAL LOW (ref 36.0–46.0)
Hemoglobin: 11.2 g/dL — ABNORMAL LOW (ref 12.0–15.0)
Immature Granulocytes: 1 %
Lymphocytes Relative: 23 %
Lymphs Abs: 0.8 10*3/uL (ref 0.7–4.0)
MCH: 32.7 pg (ref 26.0–34.0)
MCHC: 33.5 g/dL (ref 30.0–36.0)
MCV: 97.7 fL (ref 80.0–100.0)
MONO ABS: 0.5 10*3/uL (ref 0.1–1.0)
Monocytes Relative: 15 %
Neutro Abs: 1.9 10*3/uL (ref 1.7–7.7)
Neutrophils Relative %: 58 %
Platelets: 210 10*3/uL (ref 150–400)
RBC: 3.42 MIL/uL — ABNORMAL LOW (ref 3.87–5.11)
RDW: 18.4 % — ABNORMAL HIGH (ref 11.5–15.5)
WBC: 3.3 10*3/uL — ABNORMAL LOW (ref 4.0–10.5)
nRBC: 0 % (ref 0.0–0.2)

## 2018-06-23 LAB — COMPREHENSIVE METABOLIC PANEL
ALT: 18 U/L (ref 0–44)
AST: 15 U/L (ref 15–41)
Albumin: 3.9 g/dL (ref 3.5–5.0)
Alkaline Phosphatase: 61 U/L (ref 38–126)
Anion gap: 9 (ref 5–15)
BUN: 5 mg/dL — ABNORMAL LOW (ref 6–20)
CALCIUM: 9.5 mg/dL (ref 8.9–10.3)
CO2: 26 mmol/L (ref 22–32)
Chloride: 107 mmol/L (ref 98–111)
Creatinine, Ser: 0.63 mg/dL (ref 0.44–1.00)
GFR calc Af Amer: >60 mL/min (ref >60)
Glucose, Bld: 109 mg/dL — ABNORMAL HIGH (ref 70–99)
Potassium: 4 mmol/L (ref 3.5–5.1)
Sodium: 142 mmol/L (ref 135–145)
Total Bilirubin: 0.6 mg/dL (ref 0.3–1.2)
Total Protein: 6.9 g/dL (ref 6.5–8.1)

## 2018-06-23 MED ORDER — SODIUM CHLORIDE 0.9 % IV SOLN
Freq: Once | INTRAVENOUS | Status: AC
  Administered 2018-06-23: 10:00:00 via INTRAVENOUS
  Filled 2018-06-23: qty 250

## 2018-06-23 MED ORDER — FAMOTIDINE IN NACL 20-0.9 MG/50ML-% IV SOLN
INTRAVENOUS | Status: AC
  Filled 2018-06-23: qty 50

## 2018-06-23 MED ORDER — SODIUM CHLORIDE 0.9% FLUSH
10.0000 mL | INTRAVENOUS | Status: DC | PRN
  Administered 2018-06-23: 10 mL
  Filled 2018-06-23: qty 10

## 2018-06-23 MED ORDER — FAMOTIDINE IN NACL 20-0.9 MG/50ML-% IV SOLN
20.0000 mg | Freq: Once | INTRAVENOUS | Status: AC
  Administered 2018-06-23: 20 mg via INTRAVENOUS

## 2018-06-23 MED ORDER — DIPHENHYDRAMINE HCL 50 MG/ML IJ SOLN
INTRAMUSCULAR | Status: AC
  Filled 2018-06-23: qty 1

## 2018-06-23 MED ORDER — SODIUM CHLORIDE 0.9 % IV SOLN
Freq: Once | INTRAVENOUS | Status: AC
  Administered 2018-06-23: 11:00:00 via INTRAVENOUS
  Filled 2018-06-23: qty 5

## 2018-06-23 MED ORDER — PALONOSETRON HCL INJECTION 0.25 MG/5ML
0.2500 mg | Freq: Once | INTRAVENOUS | Status: AC
  Administered 2018-06-23: 0.25 mg via INTRAVENOUS

## 2018-06-23 MED ORDER — SODIUM CHLORIDE 0.9 % IV SOLN
80.0000 mg/m2 | Freq: Once | INTRAVENOUS | Status: AC
  Administered 2018-06-23: 174 mg via INTRAVENOUS
  Filled 2018-06-23: qty 29

## 2018-06-23 MED ORDER — DIPHENHYDRAMINE HCL 50 MG/ML IJ SOLN
25.0000 mg | Freq: Once | INTRAMUSCULAR | Status: AC
  Administered 2018-06-23: 25 mg via INTRAVENOUS

## 2018-06-23 MED ORDER — HEPARIN SOD (PORK) LOCK FLUSH 100 UNIT/ML IV SOLN
500.0000 [IU] | Freq: Once | INTRAVENOUS | Status: AC | PRN
  Administered 2018-06-23: 500 [IU]
  Filled 2018-06-23: qty 5

## 2018-06-23 MED ORDER — SODIUM CHLORIDE 0.9 % IV SOLN
300.0000 mg | Freq: Once | INTRAVENOUS | Status: AC
  Administered 2018-06-23: 300 mg via INTRAVENOUS
  Filled 2018-06-23: qty 30

## 2018-06-23 MED ORDER — PALONOSETRON HCL INJECTION 0.25 MG/5ML
INTRAVENOUS | Status: AC
  Filled 2018-06-23: qty 5

## 2018-06-23 NOTE — Patient Instructions (Signed)
[facility] Discharge Instructions for Patients Receiving Chemotherapy  Today you received the following chemotherapy agents Taxol, Carboplatin  To help prevent nausea and vomiting after your treatment, we encourage you to take your nausea medication as directed  If you develop nausea and vomiting that is not controlled by your nausea medication, call the clinic.   BELOW ARE SYMPTOMS THAT SHOULD BE REPORTED IMMEDIATELY:  *FEVER GREATER THAN 100.5 F  *CHILLS WITH OR WITHOUT FEVER  NAUSEA AND VOMITING THAT IS NOT CONTROLLED WITH YOUR NAUSEA MEDICATION  *UNUSUAL SHORTNESS OF BREATH  *UNUSUAL BRUISING OR BLEEDING  TENDERNESS IN MOUTH AND THROAT WITH OR WITHOUT PRESENCE OF ULCERS  *URINARY PROBLEMS  *BOWEL PROBLEMS  UNUSUAL RASH Items with * indicate a potential emergency and should be followed up as soon as possible.  Feel free to call the clinic should you have any questions or concerns. The clinic phone number is [phone].  Please show the CHEMO ALERT CARD at check-in to the Emergency Department and triage nurse.   

## 2018-06-23 NOTE — Progress Notes (Signed)
[place]  [phone] [phone]     ID: Paula Berry DOB: [birth year]  MR#: [id number]  ZPH#:[id number]  Patient Care Team: Vernie Shanks, MD as PCP - General (Family Medicine) Rolm Bookbinder, MD as Consulting Physician (General Surgery) Magrinat, Virgie Dad, MD as Consulting Physician (Oncology) Eppie Gibson, MD as Attending Physician (Radiation Oncology) Everlene Farrier, MD as Consulting Physician (Obstetrics and Gynecology) Scot Dock, NP OTHER MD:  CHIEF COMPLAINT: Triple negative breast cancer  CURRENT TREATMENT: Neoadjuvant chemotherapy: Carboplatin and paclitaxel x12   HISTORY OF CURRENT ILLNESS: From the original intake note:  The patient had a left mammogram with tomography and ultrasonography 07/12/2015 at the [facility] after screening recall for a possible left breast mass.  This found the breast density to be category C.  There was a small lobulated mass in the lower outer aspect of the left breast, which was not palpable.  Ultrasound showed a small lobulated cyst in that area measuring 0.6 cm.  This was felt to be benign.  In July 2019 she had screening mammography at Dr. Clementeen Hoof office.  I do not have that report but according to the patient it was unremarkable.  In October 2019 however the patient developed left axillary swelling and tenderness and was again set up for left mammography with tomography and left breast ultrasonography at the [facility], 03/11/2018.  Breast density was category C.  Mammography showed numerous markedly enlarged left axillary lymph nodes.  There was periareolar skin thickening.  There was an area of ill-defined distortion in the upper outer left breast and on physical exam there was a firm lump superior and lateral to the left nipple, with swelling of the left axilla.  Ultrasound of the left breast and both upper quadrants found an ill-defined irregular hypoechoic area  measuring at least 4.2 cm.  There also multiple scattered cysts.  Ultrasound of the left axilla showed at least 6 abnormal lymph nodes.  On 03/16/2018 the patient underwent right mammography with tomography and right breast ultrasonography at the [facility].  Again breast density was category C.  There were multiple circumscribed equal density masses in the upper outer quadrant of the right breast which appeared stable.  Ultrasound showed diffuse fibrocystic changes.  An additional hypoechoic mass was noted at the 9 o'clock position 3 cm from the nipple measuring 0.8 cm, with no associated vascularity.  A complex solid and cystic mass was noted superficially at the 6 o'clock position 3 cm from the nipple measuring 1.6 cm.  Evaluation of the right axilla was negative.  On 03/18/2018 the patient underwent biopsy of the 2 suspicious areas in the right breast, and they both showed only fibrocystic changes with some chronic inflammation ([id number]).  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  The patient's subsequent history is as detailed below.  INTERVAL HISTORY: Shabnam returns today for follow-up and treatment of her triple negative breast cancer.   The patient continues on carboplatin and paclitaxel x12. Today she is due for dose #4.  She receives this weekly. She is tolerating treatment well.  She denies peripheral neuropathy.   REVIEW OF SYSTEMS: Mirelle is here with her mom today.  She has been feeling moderately well.  She is fatigued.  She doesn't feel like  exercising, but has sat down and arranged an exercise plan that will start next week.  She hasn't had any peripheral neuropathy.  She has had some nausea that has happened occasionally and she will take Compazine  which will help.  She has been constipated and is managing this with miralax if needed and taking stool softeners regularly.  This is helping.    Suad notes her breast and swollen lymph nodes have regressed tremendously.  She had one episode of pain yesterday and took tylenol.  She denies any fevers or chills.  She is without vision changes, unusual headaches.  She denies vomiting, bladder concerns.  She hasn't noted chest pain, palpitations, cough, or shortness of breath.  A detailed ROS was otherwise non contributory.     PAST MEDICAL HISTORY: Past Medical History:  Diagnosis Date  . Anemia    had iron infusion 08/12/15  . Asthma   . Cancer [facility])    Breast cancer   . Genital herpes   . GERD (gastroesophageal reflux disease)   . Headache    migraines 2-3 times monthly  . Pneumonia    hospitalizied for pneumonia as a child    PAST SURGICAL HISTORY: Past Surgical History:  Procedure Laterality Date  . ABDOMINAL HYSTERECTOMY Bilateral 08/22/2015   Procedure: HYSTERECTOMY ABDOMINAL, BILATERAL SALPINGECTOMY;  Surgeon: Everlene Farrier, MD;  Location: [facility];  Service: Gynecology;  Laterality: Bilateral;  . MYOMECTOMY ABDOMINAL APPROACH    . PORTACATH PLACEMENT N/A 03/30/2018   Procedure: INSERTION PORT-A-CATH WITH [place];  Surgeon: Rolm Bookbinder, MD;  Location: [facility];  Service: General;  Laterality: N/A;    FAMILY HISTORY Family History  Problem Relation Age of Onset  . Hypertension Unknown   . Diabetes Cousin        mat first cousin  . Cancer Unknown   . Dementia Maternal Grandfather   . Lung cancer Paternal Grandmother        d. 18-60  . Brain cancer Paternal Aunt        d. 10, father's mat 1/2 sister  . Diabetes Maternal Grandmother   . Other Paternal Grandfather        d. WWII  . Aneurysm Maternal Uncle        brain  . Migraines Neg Hx   The patient's parents are in their early 47s as of October 2019.  A paternal aunt had brain cancer at age 79.  A paternal  grandmother had lung cancer at age 63.  There is no breast or ovarian cancer history in the family.  However note that the patient has little information on her mother side of the family.  GYNECOLOGIC HISTORY:  No LMP recorded. Patient has had a hysterectomy. 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  Her husband [name]. works for the [employer].  He is a former patient here, with stage IV colon cancer.  They live alone, with no pets.     ADVANCED DIRECTIVES: Not in place   HEALTH MAINTENANCE: Social History   Tobacco Use  . Smoking status: Never Smoker  . Smokeless tobacco: Never Used  Substance Use Topics  . Alcohol use: Yes    Alcohol/week: 1.0 standard drinks    Types: 1 Glasses  of wine per week    Comment: daily  . Drug use: No     Colonoscopy: Never  PAP: Status post hysterectomy  Bone density: Never   Allergies  Allergen Reactions  . Neosporin [Neomycin-Bacitracin Zn-Polymyx] Other (See Comments)    Decreased wound healing with blisters    Current Outpatient Medications  Medication Sig Dispense Refill  . acetaminophen (TYLENOL) 500 MG tablet Take 1,000 mg by mouth every 8 (eight) hours as needed for moderate pain.    Marland Kitchen acyclovir ointment (ZOVIRAX) 5 % Apply 1 application topically 4 (four) times daily as needed (outbreaks).   2  . albuterol (PROVENTIL HFA;VENTOLIN HFA) 108 (90 Base) MCG/ACT inhaler Inhale 2 puffs into the lungs every 6 (six) hours as needed for wheezing or shortness of breath.     . ciprofloxacin (CIPRO) 500 MG tablet Take 1 tablet (500 mg total) by mouth 2 (two) times daily. 14 tablet 0  . dexamethasone (DECADRON) 4 MG tablet Take 2 tablets by mouth once a day on the day  after chemotherapy and then take 2 tablets two times a day for 2 days. Take with food. 30 tablet 1  . lidocaine-prilocaine (EMLA) cream Apply to affected area once 30 g 3  . LORazepam (ATIVAN) 0.5 MG tablet Take 1 tablet (0.5 mg total) by mouth at bedtime as needed (Nausea or vomiting). 30 tablet 0  . methocarbamol (ROBAXIN) 500 MG tablet Take 500 mg by mouth 3 (three) times daily as needed for muscle spasms (pain).    . montelukast (SINGULAIR) 10 MG tablet Take 10 mg by mouth every evening.    Marland Kitchen oxyCODONE (OXY IR/ROXICODONE) 5 MG immediate release tablet Take 1 tablet (5 mg total) by mouth every 6 (six) hours as needed for moderate pain, severe pain or breakthrough pain. 8 tablet 0  . pantoprazole (PROTONIX) 40 MG tablet Take 40 mg by mouth every evening.    . prochlorperazine (COMPAZINE) 10 MG tablet Take 1 tablet (10 mg total) by mouth every 6 (six) hours as needed (Nausea or vomiting). 30 tablet 1  . SYMBICORT 160-4.5 MCG/ACT inhaler INL 2 PFS PO BID  3  . valACYclovir (VALTREX) 500 MG tablet Take 500 mg by mouth See admin instructions. Take 500 mg twice daily for 3 days at onset of outbreak then take 500 mg once daily until clear     No current facility-administered medications for this visit.     OBJECTIVE: Middle-aged African-American woman in no acute distress Vitals:   06/23/18 0927  BP: 111/68  Pulse: 99  Resp: 18  Temp: 98.1 F (36.7 C)  SpO2: 100%     Body mass index is 30.61 kg/m.   Wt Readings from Last 3 Encounters:  06/23/18 207 lb 4.8 oz (94 kg)  06/09/18 205 lb 8 oz (93.2 kg)  06/02/18 207 lb 4.8 oz (94 kg)  ECOG FS:2 GENERAL: Patient is a well appearing female in no acute distress HEENT:  Sclerae anicteric.  Oropharynx clear and moist. No ulcerations or evidence of oropharyngeal candidiasis. Neck is supple.  NODES:  No cervical, supraclavicular, or axillary lymphadenopathy palpated.  BREAST EXAM:  Unable to palpate mass in left upper outer quadrant LUNGS:  Clear  to auscultation bilaterally.  No wheezes or rhonchi. HEART:  Regular rate and rhythm. No murmur appreciated. ABDOMEN:  Soft, nontender.  Positive, normoactive bowel sounds. No organomegaly palpated. MSK:  No focal spinal tenderness to palpation. Full range of motion bilaterally in the upper extremities. EXTREMITIES:  No  peripheral edema.   SKIN:  Clear with no obvious rashes or skin changes. No nail dyscrasia. NEURO:  Nonfocal. Well oriented.  Appropriate affect.     LAB RESULTS:  CMP     Component Value Date/Time   NA 142 06/16/2018 0904   K 3.9 06/16/2018 0904   CL 108 06/16/2018 0904   CO2 25 06/16/2018 0904   GLUCOSE 96 06/16/2018 0904   BUN 6 06/16/2018 0904   CREATININE 0.57 06/16/2018 0904   CREATININE 0.67 03/23/2018 0855   CALCIUM 9.2 06/16/2018 0904   PROT 6.6 06/16/2018 0904   ALBUMIN 3.8 06/16/2018 0904   AST 13 (L) 06/16/2018 0904   AST 15 03/23/2018 0855   ALT 12 06/16/2018 0904   ALT 17 03/23/2018 0855   ALKPHOS 61 06/16/2018 0904   BILITOT 0.7 06/16/2018 0904   BILITOT 1.0 03/23/2018 0855   GFRNONAA >60 06/16/2018 0904   GFRNONAA >60 03/23/2018 0855   GFRAA >60 06/16/2018 0904   GFRAA >60 03/23/2018 0855    No results found for: Ronnald Ramp, A1GS, A2GS, BETS, BETA2SER, GAMS, MSPIKE, SPEI  No results found for: Nils Pyle, [facility]  Lab Results  Component Value Date   WBC 3.3 (L) 06/23/2018   NEUTROABS 1.9 06/23/2018   HGB 11.2 (L) 06/23/2018   HCT 33.4 (L) 06/23/2018   MCV 97.7 06/23/2018   PLT 210 06/23/2018      Chemistry      Component Value Date/Time   NA 142 06/16/2018 0904   K 3.9 06/16/2018 0904   CL 108 06/16/2018 0904   CO2 25 06/16/2018 0904   BUN 6 06/16/2018 0904   CREATININE 0.57 06/16/2018 0904   CREATININE 0.67 03/23/2018 0855      Component Value Date/Time   CALCIUM 9.2 06/16/2018 0904   ALKPHOS 61 06/16/2018 0904   AST 13 (L) 06/16/2018 0904   AST 15 03/23/2018 0855   ALT 12 06/16/2018  0904   ALT 17 03/23/2018 0855   BILITOT 0.7 06/16/2018 0904   BILITOT 1.0 03/23/2018 0855       No results found for: LABCA2  No components found for: FHLKTG256  No results for input(s): INR in the last 168 hours.  No results found for: LABCA2  No results found for: CAN199  No results found for: LSL373  No results found for: SKA768  Lab Results  Component Value Date   CA2729 213.7 (H) 04/07/2018    No components found for: HGQUANT  No results found for: CEA1 / No results found for: CEA1   No results found for: AFPTUMOR  No results found for: [place]  No results found for: PSA1  Appointment on 06/23/2018  Component Date Value Ref Range Status  . WBC 06/23/2018 3.3* 4.0 - 10.5 K/uL Final  . RBC 06/23/2018 3.42* 3.87 - 5.11 MIL/uL Final  . Hemoglobin 06/23/2018 11.2* 12.0 - 15.0 g/dL Final  . HCT 06/23/2018 33.4* 36.0 - 46.0 % Final  . MCV 06/23/2018 97.7  80.0 - 100.0 fL Final  . MCH 06/23/2018 32.7  26.0 - 34.0 pg Final  . MCHC 06/23/2018 33.5  30.0 - 36.0 g/dL Final  . RDW 06/23/2018 18.4* 11.5 - 15.5 % Final  . Platelets 06/23/2018 210  150 - 400 K/uL Final  . nRBC 06/23/2018 0.0  0.0 - 0.2 % Final  . Neutrophils Relative % 06/23/2018 58  % Final  . Neutro Abs 06/23/2018 1.9  1.7 - 7.7 K/uL Final  . Lymphocytes Relative 06/23/2018 23  %  Final  . Lymphs Abs 06/23/2018 0.8  0.7 - 4.0 K/uL Final  . Monocytes Relative 06/23/2018 15  % Final  . Monocytes Absolute 06/23/2018 0.5  0.1 - 1.0 K/uL Final  . Eosinophils Relative 06/23/2018 2  % Final  . Eosinophils Absolute 06/23/2018 0.1  0.0 - 0.5 K/uL Final  . Basophils Relative 06/23/2018 1  % Final  . Basophils Absolute 06/23/2018 0.0  0.0 - 0.1 K/uL Final  . Immature Granulocytes 06/23/2018 1  % Final  . Abs Immature Granulocytes 06/23/2018 0.02  0.00 - 0.07 K/uL Final   Performed at [facility], [place] [address], [place], [place] [address]    (this displays the last labs from the  last 3 days)  No results found for: TOTALPROTELP, ALBUMINELP, A1GS, A2GS, BETS, BETA2SER, GAMS, MSPIKE, SPEI (this displays SPEP labs)  No results found for: KPAFRELGTCHN, LAMBDASER, KAPLAMBRATIO (kappa/lambda light chains)  No results found for: HGBA, HGBA2QUANT, HGBFQUANT, HGBSQUAN (Hemoglobinopathy evaluation)   No results found for: LDH  No results found for: IRON, TIBC, IRONPCTSAT (Iron and TIBC)  No results found for: FERRITIN  Urinalysis No results found for: COLORURINE, APPEARANCEUR, LABSPEC, PHURINE, GLUCOSEU, HGBUR, BILIRUBINUR, KETONESUR, PROTEINUR, UROBILINOGEN, NITRITE, LEUKOCYTESUR   STUDIES: US Breast Ltd Uni Left Inc Axilla  Result Date: 06/07/2018 CLINICAL DATA:  48 year old female presenting for ultrasound evaluation of the known malignancy in her left breast. She is currently undergoing neoadjuvant chemotherapy and is having new pain in the left breast. She feels that the swelling and fullness of her left axilla has significantly improved. EXAM: ULTRASOUND OF THE LEFT BREAST COMPARISON:  Previous exam(s). FINDINGS: Ultrasound of the superior left breast demonstrates the area of biopsy at 1 o'clock, 1 cm from the nipple. There is also persistent ill-defined hypoechoic tissue in the superior left breast at 12 o'clock 1-2 cm from the nipple. Due to the ill-defined nature of the mass is difficult to assess difference in size, however visually the mass does appear less dense than on prior exam. Ultrasound of the left axilla demonstrates several thickened lymph nodes, which have overall significantly decreased in size. The biopsied lymph node in the left axilla is seen, whose cortex now measures 0.4 cm, previously 1.3 cm. IMPRESSION: 1. Due to the ill-defined irregular appearance of the known malignancy in the left breast, accurate assessment of response to neoadjuvant chemotherapy based on size is difficult. The mass does appear overall less dense. 2. Marked decrease in size of  the bulky left axillary lymphadenopathy. RECOMMENDATION: If a more accurate assessment of the differences size is desired to determine response to neoadjuvant chemotherapy, MRI is recommended. I have discussed the findings and recommendations with the patient. Results were also provided in writing at the conclusion of the visit. If applicable, a reminder letter will be sent to the patient regarding the next appointment. BI-RADS CATEGORY  6: Known biopsy-proven malignancy. Electronically Signed   By: Ammie Ferrier M.D.   On: 06/07/2018 13:59     ELIGIBLE FOR AVAILABLE RESEARCH PROTOCOL:  Possibly [id number]  ASSESSMENT: 48 y.o. [place] woman status post left breast overlapping site biopsy and left axillary lymph node biopsy 03/16/2018 for a clinical T2 N3, stage IIIC invasive ductal carcinoma, triple negative, with an MIB-1 of 15%   (1) Staging studies  (a) CT chest on 04/05/2018: left axillary and chronic left retropectoral adenopathy, supraclavicular adenopathy, left breast skin thickening and mass, 68m left upper lobe pulmonary nodule (repeat ct chest in 3-6 months recommended), bronchiectasis in  left lower lobe.  (b) CT neck on 04/05/2018: left supraclavicular adenopathy, ? Abnormality in left frontal lobe of brain  (c) Bone scan on 04/05/2018: no findings suggestive of metastatic disease to bone  (d) MRI brain 04/07/2018: negative  (2) neoadjuvant chemotherapy consisting of doxorubicin and cyclophosphamide in dose dense fashion x4 started 04/07/2018 and completed 05/19/2018,  followed by weekly carboplatin and paclitaxel x12 starting 06/02/2018  (3) genetics testing 11/18: no deleterious mutations.  Genes tested include: AIP, ALK, APC, ATM, AXIN2,BAP1,  BARD1, BLM, BMPR1A, BRCA1, BRCA2, BRIP1, CASR, CDC73, CDH1, CDK4, CDKN1B, CDKN1C, CDKN2A (p14ARF), CDKN2A (p16INK4a), CEBPA, CHEK2, CTNNA1, DICER1, DIS3L2, EGFR (c.2369C>T, p.Thr790Met variant only), EPCAM (Deletion/duplication testing only), FH,  FLCN, GATA2, GPC3, GREM1 (Promoter region deletion/duplication testing only), HOXB13 (c.251G>A, p.Gly84Glu), HRAS, KIT, MAX, MEN1, MET, MITF (c.952G>A, p.Glu318Lys variant only), MLH1, MSH2, MSH3, MSH6, MUTYH, NBN, NF1, NF2, NTHL1, PALB2, PDGFRA, PHOX2B, PMS2, POLD1, POLE, POT1, PRKAR1A, PTCH1, PTEN, RAD50, RAD51C, RAD51D, RB1, RECQL4, RET, RUNX1, SDHAF2, SDHA (sequence changes only), SDHB, SDHC, SDHD, SMAD4, SMARCA4, SMARCB1, SMARCE1, STK11, SUFU, TERC, TERT, TMEM127, TP53, TSC1, TSC2, VHL, WRN and WT1.  (4) definitive surgery to follow  (5) adjuvant radiation   PLAN: Noelly is doing moderately well today.  She is fatigued.  Her labs are stable, she is tolerating her treatments well and will proceed with her fourth weekly Paclitaxel and Carboplatin today.  She has not developed any sign of peripheral neuropathy.  Her cancer on exam appears to be responding well.    Toleen will continue on Compazine for nausea, and her bowel regimen to prevent any further constipation.  She is managing these side effects well.    She had a question on whether or not she may have had thrush last week.  I gave her the info for my chart.  I let her know that she can always send [place] pictures through my chart if needed, but oral exam today was unremarkable.    I gave Chrishelle encouragement, particularly where the exercise is concerned to be slightly more active to help get reconditioned prior to surgery.  She is looking forward to executing her plan.    She will return weekly for labs and her chemotherapy with Paclitaxel and Carboplatin.  Myself or Dr. Jana Hakim will see her with every other treatment.  She knows to call for any other issue that may develop before the next scheduled visit.  A total of (30) minutes of face-to-face time was spent with this patient with greater than 50% of that time in counseling and care-coordination.    Wilber Bihari, NP  06/23/18 9:38 AM Medical Oncology and Hematology [facility] [address] [place], [place] [address] Tel. [phone]    Fax. [phone]

## 2018-06-23 NOTE — Telephone Encounter (Signed)
Per 1/23 no los °

## 2018-06-30 ENCOUNTER — Inpatient Hospital Stay: Payer: BC Managed Care – PPO

## 2018-06-30 ENCOUNTER — Ambulatory Visit: Payer: BC Managed Care – PPO

## 2018-06-30 VITALS — BP 109/70 | HR 96 | Temp 98.3°F | Resp 16

## 2018-06-30 DIAGNOSIS — Z171 Estrogen receptor negative status [ER-]: Principal | ICD-10-CM

## 2018-06-30 DIAGNOSIS — C50412 Malignant neoplasm of upper-outer quadrant of left female breast: Secondary | ICD-10-CM | POA: Diagnosis not present

## 2018-06-30 LAB — COMPREHENSIVE METABOLIC PANEL
ALT: 13 U/L (ref 0–44)
AST: 11 U/L — AB (ref 15–41)
Albumin: 3.8 g/dL (ref 3.5–5.0)
Alkaline Phosphatase: 69 U/L (ref 38–126)
Anion gap: 10 (ref 5–15)
BUN: 7 mg/dL (ref 6–20)
CO2: 25 mmol/L (ref 22–32)
Calcium: 9.2 mg/dL (ref 8.9–10.3)
Chloride: 107 mmol/L (ref 98–111)
Creatinine, Ser: 0.62 mg/dL (ref 0.44–1.00)
GFR calc Af Amer: >60 mL/min (ref >60)
GFR calc non Af Amer: >60 mL/min (ref >60)
Glucose, Bld: 96 mg/dL (ref 70–99)
Potassium: 3.9 mmol/L (ref 3.5–5.1)
SODIUM: 142 mmol/L (ref 135–145)
Total Bilirubin: 0.8 mg/dL (ref 0.3–1.2)
Total Protein: 6.9 g/dL (ref 6.5–8.1)

## 2018-06-30 LAB — CBC WITH DIFFERENTIAL/PLATELET
ABS IMMATURE GRANULOCYTES: 0.03 10*3/uL (ref 0.00–0.07)
Basophils Absolute: 0 10*3/uL (ref 0.0–0.1)
Basophils Relative: 1 %
Eosinophils Absolute: 0.1 10*3/uL (ref 0.0–0.5)
Eosinophils Relative: 1 %
HCT: 33.4 % — ABNORMAL LOW (ref 36.0–46.0)
HEMOGLOBIN: 10.9 g/dL — AB (ref 12.0–15.0)
Immature Granulocytes: 1 %
Lymphocytes Relative: 10 %
Lymphs Abs: 0.7 10*3/uL (ref 0.7–4.0)
MCH: 32.8 pg (ref 26.0–34.0)
MCHC: 32.6 g/dL (ref 30.0–36.0)
MCV: 100.6 fL — ABNORMAL HIGH (ref 80.0–100.0)
Monocytes Absolute: 0.5 10*3/uL (ref 0.1–1.0)
Monocytes Relative: 8 %
NEUTROS ABS: 5.2 10*3/uL (ref 1.7–7.7)
Neutrophils Relative %: 79 %
Platelets: 151 10*3/uL (ref 150–400)
RBC: 3.32 MIL/uL — ABNORMAL LOW (ref 3.87–5.11)
RDW: 18.4 % — ABNORMAL HIGH (ref 11.5–15.5)
WBC: 6.5 10*3/uL (ref 4.0–10.5)
nRBC: 0 % (ref 0.0–0.2)

## 2018-06-30 MED ORDER — HEPARIN SOD (PORK) LOCK FLUSH 100 UNIT/ML IV SOLN
500.0000 [IU] | Freq: Once | INTRAVENOUS | Status: AC | PRN
  Administered 2018-06-30: 500 [IU]
  Filled 2018-06-30: qty 5

## 2018-06-30 MED ORDER — SODIUM CHLORIDE 0.9 % IV SOLN
300.0000 mg | Freq: Once | INTRAVENOUS | Status: AC
  Administered 2018-06-30: 300 mg via INTRAVENOUS
  Filled 2018-06-30: qty 30

## 2018-06-30 MED ORDER — DIPHENHYDRAMINE HCL 50 MG/ML IJ SOLN
25.0000 mg | Freq: Once | INTRAMUSCULAR | Status: AC
  Administered 2018-06-30: 25 mg via INTRAVENOUS

## 2018-06-30 MED ORDER — SODIUM CHLORIDE 0.9 % IV SOLN
80.0000 mg/m2 | Freq: Once | INTRAVENOUS | Status: AC
  Administered 2018-06-30: 174 mg via INTRAVENOUS
  Filled 2018-06-30: qty 29

## 2018-06-30 MED ORDER — SODIUM CHLORIDE 0.9 % IV SOLN
Freq: Once | INTRAVENOUS | Status: AC
  Administered 2018-06-30: 10:00:00 via INTRAVENOUS
  Filled 2018-06-30: qty 250

## 2018-06-30 MED ORDER — FAMOTIDINE IN NACL 20-0.9 MG/50ML-% IV SOLN
20.0000 mg | Freq: Once | INTRAVENOUS | Status: AC
  Administered 2018-06-30: 20 mg via INTRAVENOUS

## 2018-06-30 MED ORDER — FAMOTIDINE IN NACL 20-0.9 MG/50ML-% IV SOLN
INTRAVENOUS | Status: AC
  Filled 2018-06-30: qty 50

## 2018-06-30 MED ORDER — SODIUM CHLORIDE 0.9 % IV SOLN
Freq: Once | INTRAVENOUS | Status: AC
  Administered 2018-06-30: 11:00:00 via INTRAVENOUS
  Filled 2018-06-30: qty 5

## 2018-06-30 MED ORDER — SODIUM CHLORIDE 0.9% FLUSH
10.0000 mL | INTRAVENOUS | Status: DC | PRN
  Administered 2018-06-30: 10 mL
  Filled 2018-06-30: qty 10

## 2018-06-30 MED ORDER — PALONOSETRON HCL INJECTION 0.25 MG/5ML
0.2500 mg | Freq: Once | INTRAVENOUS | Status: AC
  Administered 2018-06-30: 0.25 mg via INTRAVENOUS

## 2018-06-30 MED ORDER — DIPHENHYDRAMINE HCL 50 MG/ML IJ SOLN
INTRAMUSCULAR | Status: AC
  Filled 2018-06-30: qty 1

## 2018-06-30 MED ORDER — PALONOSETRON HCL INJECTION 0.25 MG/5ML
INTRAVENOUS | Status: AC
  Filled 2018-06-30: qty 5

## 2018-06-30 NOTE — Patient Instructions (Signed)
[facility] Discharge Instructions for Patients Receiving Chemotherapy  Today you received the following chemotherapy agents Taxol, Carboplatin  To help prevent nausea and vomiting after your treatment, we encourage you to take your nausea medication as directed  If you develop nausea and vomiting that is not controlled by your nausea medication, call the clinic.   BELOW ARE SYMPTOMS THAT SHOULD BE REPORTED IMMEDIATELY:  *FEVER GREATER THAN 100.5 F  *CHILLS WITH OR WITHOUT FEVER  NAUSEA AND VOMITING THAT IS NOT CONTROLLED WITH YOUR NAUSEA MEDICATION  *UNUSUAL SHORTNESS OF BREATH  *UNUSUAL BRUISING OR BLEEDING  TENDERNESS IN MOUTH AND THROAT WITH OR WITHOUT PRESENCE OF ULCERS  *URINARY PROBLEMS  *BOWEL PROBLEMS  UNUSUAL RASH Items with * indicate a potential emergency and should be followed up as soon as possible.  Feel free to call the clinic should you have any questions or concerns. The clinic phone number is [phone].  Please show the CHEMO ALERT CARD at check-in to the Emergency Department and triage nurse.   

## 2018-07-07 ENCOUNTER — Inpatient Hospital Stay: Payer: BC Managed Care – PPO

## 2018-07-07 ENCOUNTER — Encounter: Payer: Self-pay | Admitting: *Deleted

## 2018-07-07 ENCOUNTER — Inpatient Hospital Stay (HOSPITAL_BASED_OUTPATIENT_CLINIC_OR_DEPARTMENT_OTHER): Payer: BC Managed Care – PPO | Admitting: Adult Health

## 2018-07-07 ENCOUNTER — Inpatient Hospital Stay: Payer: BC Managed Care – PPO | Attending: Oncology

## 2018-07-07 ENCOUNTER — Ambulatory Visit (HOSPITAL_COMMUNITY)
Admission: RE | Admit: 2018-07-07 | Discharge: 2018-07-07 | Disposition: A | Discharge status: Home / Self Care | Payer: BC Managed Care – PPO | Source: Ambulatory Visit | Attending: Adult Health | Admitting: Adult Health

## 2018-07-07 ENCOUNTER — Encounter: Payer: Self-pay | Admitting: Adult Health

## 2018-07-07 VITALS — HR 95

## 2018-07-07 VITALS — BP 108/74 | HR 105 | Temp 98.5°F | Resp 18 | Ht 69.0 in | Wt 207.8 lb

## 2018-07-07 DIAGNOSIS — R059 Cough, unspecified: Secondary | ICD-10-CM

## 2018-07-07 DIAGNOSIS — R918 Other nonspecific abnormal finding of lung field: Secondary | ICD-10-CM | POA: Insufficient documentation

## 2018-07-07 DIAGNOSIS — Z171 Estrogen receptor negative status [ER-]: Secondary | ICD-10-CM | POA: Insufficient documentation

## 2018-07-07 DIAGNOSIS — C50412 Malignant neoplasm of upper-outer quadrant of left female breast: Secondary | ICD-10-CM

## 2018-07-07 DIAGNOSIS — R05 Cough: Secondary | ICD-10-CM | POA: Diagnosis not present

## 2018-07-07 DIAGNOSIS — C773 Secondary and unspecified malignant neoplasm of axilla and upper limb lymph nodes: Secondary | ICD-10-CM

## 2018-07-07 DIAGNOSIS — G43809 Other migraine, not intractable, without status migrainosus: Secondary | ICD-10-CM | POA: Insufficient documentation

## 2018-07-07 DIAGNOSIS — R590 Localized enlarged lymph nodes: Secondary | ICD-10-CM

## 2018-07-07 DIAGNOSIS — Z5111 Encounter for antineoplastic chemotherapy: Secondary | ICD-10-CM | POA: Diagnosis present

## 2018-07-07 LAB — CBC WITH DIFFERENTIAL/PLATELET
Abs Immature Granulocytes: 0.02 10*3/uL (ref 0.00–0.07)
BASOS ABS: 0 10*3/uL (ref 0.0–0.1)
Basophils Relative: 0 %
EOS ABS: 0.1 10*3/uL (ref 0.0–0.5)
Eosinophils Relative: 1 %
HCT: 34.4 % — ABNORMAL LOW (ref 36.0–46.0)
Hemoglobin: 11.2 g/dL — ABNORMAL LOW (ref 12.0–15.0)
Immature Granulocytes: 0 %
Lymphocytes Relative: 16 %
Lymphs Abs: 0.7 10*3/uL (ref 0.7–4.0)
MCH: 32.9 pg (ref 26.0–34.0)
MCHC: 32.6 g/dL (ref 30.0–36.0)
MCV: 101.2 fL — ABNORMAL HIGH (ref 80.0–100.0)
Monocytes Absolute: 0.5 10*3/uL (ref 0.1–1.0)
Monocytes Relative: 11 %
Neutro Abs: 3.3 10*3/uL (ref 1.7–7.7)
Neutrophils Relative %: 72 %
PLATELETS: 177 10*3/uL (ref 150–400)
RBC: 3.4 MIL/uL — ABNORMAL LOW (ref 3.87–5.11)
RDW: 17.7 % — AB (ref 11.5–15.5)
WBC: 4.7 10*3/uL (ref 4.0–10.5)
nRBC: 0 % (ref 0.0–0.2)

## 2018-07-07 LAB — COMPREHENSIVE METABOLIC PANEL
ALT: 15 U/L (ref 0–44)
AST: 13 U/L — ABNORMAL LOW (ref 15–41)
Albumin: 3.8 g/dL (ref 3.5–5.0)
Alkaline Phosphatase: 73 U/L (ref 38–126)
Anion gap: 7 (ref 5–15)
BUN: 6 mg/dL (ref 6–20)
CO2: 28 mmol/L (ref 22–32)
Calcium: 9.7 mg/dL (ref 8.9–10.3)
Chloride: 106 mmol/L (ref 98–111)
Creatinine, Ser: 0.63 mg/dL (ref 0.44–1.00)
GFR calc non Af Amer: >60 mL/min (ref >60)
Glucose, Bld: 99 mg/dL (ref 70–99)
Potassium: 4.1 mmol/L (ref 3.5–5.1)
Sodium: 141 mmol/L (ref 135–145)
Total Bilirubin: 0.8 mg/dL (ref 0.3–1.2)
Total Protein: 7.1 g/dL (ref 6.5–8.1)

## 2018-07-07 MED ORDER — DIPHENHYDRAMINE HCL 50 MG/ML IJ SOLN
25.0000 mg | Freq: Once | INTRAMUSCULAR | Status: AC
  Administered 2018-07-07: 25 mg via INTRAVENOUS

## 2018-07-07 MED ORDER — SODIUM CHLORIDE 0.9 % IV SOLN
300.0000 mg | Freq: Once | INTRAVENOUS | Status: AC
  Administered 2018-07-07: 300 mg via INTRAVENOUS
  Filled 2018-07-07: qty 30

## 2018-07-07 MED ORDER — HYDROCOD POLST-CPM POLST ER 10-8 MG/5ML PO SUER: 5.0000 mL | Freq: Two times a day (BID) | ORAL | 0 refills | Status: DC | PRN

## 2018-07-07 MED ORDER — SODIUM CHLORIDE 0.9 % IV SOLN
Freq: Once | INTRAVENOUS | Status: AC
  Administered 2018-07-07: 13:00:00 via INTRAVENOUS
  Filled 2018-07-07: qty 250

## 2018-07-07 MED ORDER — SODIUM CHLORIDE 0.9 % IV SOLN
Freq: Once | INTRAVENOUS | Status: AC
  Administered 2018-07-07: 13:00:00 via INTRAVENOUS
  Filled 2018-07-07: qty 5

## 2018-07-07 MED ORDER — DIPHENHYDRAMINE HCL 50 MG/ML IJ SOLN
INTRAMUSCULAR | Status: AC
  Filled 2018-07-07: qty 1

## 2018-07-07 MED ORDER — HEPARIN SOD (PORK) LOCK FLUSH 100 UNIT/ML IV SOLN
500.0000 [IU] | Freq: Once | INTRAVENOUS | Status: AC | PRN
  Administered 2018-07-07: 500 [IU]
  Filled 2018-07-07: qty 5

## 2018-07-07 MED ORDER — FAMOTIDINE IN NACL 20-0.9 MG/50ML-% IV SOLN
INTRAVENOUS | Status: AC
  Filled 2018-07-07: qty 50

## 2018-07-07 MED ORDER — SODIUM CHLORIDE 0.9 % IV SOLN
80.0000 mg/m2 | Freq: Once | INTRAVENOUS | Status: AC
  Administered 2018-07-07: 174 mg via INTRAVENOUS
  Filled 2018-07-07: qty 29

## 2018-07-07 MED ORDER — PALONOSETRON HCL INJECTION 0.25 MG/5ML
INTRAVENOUS | Status: AC
  Filled 2018-07-07: qty 5

## 2018-07-07 MED ORDER — PALONOSETRON HCL INJECTION 0.25 MG/5ML
0.2500 mg | Freq: Once | INTRAVENOUS | Status: AC
  Administered 2018-07-07: 0.25 mg via INTRAVENOUS

## 2018-07-07 MED ORDER — FAMOTIDINE IN NACL 20-0.9 MG/50ML-% IV SOLN
20.0000 mg | Freq: Once | INTRAVENOUS | Status: AC
  Administered 2018-07-07: 20 mg via INTRAVENOUS

## 2018-07-07 MED ORDER — AZITHROMYCIN 250 MG PO TABS: ORAL_TABLET | ORAL | 0 refills | Status: DC

## 2018-07-07 MED ORDER — SODIUM CHLORIDE 0.9% FLUSH
10.0000 mL | INTRAVENOUS | Status: DC | PRN
  Administered 2018-07-07: 10 mL
  Filled 2018-07-07: qty 10

## 2018-07-07 NOTE — Patient Instructions (Signed)
   [facility] Discharge Instructions for Patients Receiving Chemotherapy  Today you received the following chemotherapy agents Taxol and Carboplatin   To help prevent nausea and vomiting after your treatment, we encourage you to take your nausea medication as directed.    If you develop nausea and vomiting that is not controlled by your nausea medication, call the clinic.   BELOW ARE SYMPTOMS THAT SHOULD BE REPORTED IMMEDIATELY:  *FEVER GREATER THAN 100.5 F  *CHILLS WITH OR WITHOUT FEVER  NAUSEA AND VOMITING THAT IS NOT CONTROLLED WITH YOUR NAUSEA MEDICATION  *UNUSUAL SHORTNESS OF BREATH  *UNUSUAL BRUISING OR BLEEDING  TENDERNESS IN MOUTH AND THROAT WITH OR WITHOUT PRESENCE OF ULCERS  *URINARY PROBLEMS  *BOWEL PROBLEMS  UNUSUAL RASH Items with * indicate a potential emergency and should be followed up as soon as possible.  Feel free to call the clinic should you have any questions or concerns. The clinic phone number is [phone].  Please show the CHEMO ALERT CARD at check-in to the Emergency Department and triage nurse.   

## 2018-07-07 NOTE — Progress Notes (Signed)
[place]  [phone] [phone]     ID: Caryl Fate DOB: 1970-10-08  MR#: [id number]  VOP#:[id number]  Patient Care Team: Vernie Shanks, MD as PCP - General (Family Medicine) Rolm Bookbinder, MD as Consulting Physician (General Surgery) Magrinat, Virgie Dad, MD as Consulting Physician (Oncology) Eppie Gibson, MD as Attending Physician (Radiation Oncology) Everlene Farrier, MD as Consulting Physician (Obstetrics and Gynecology) Scot Dock, NP OTHER MD:  CHIEF COMPLAINT: Triple negative breast cancer  CURRENT TREATMENT: Neoadjuvant chemotherapy: Carboplatin and paclitaxel x12   HISTORY OF CURRENT ILLNESS: From the original intake note:  The patient had a left mammogram with tomography and ultrasonography 07/12/2015 at the [facility] after screening recall for a possible left breast mass.  This found the breast density to be category C.  There was a small lobulated mass in the lower outer aspect of the left breast, which was not palpable.  Ultrasound showed a small lobulated cyst in that area measuring 0.6 cm.  This was felt to be benign.  In July 2019 she had screening mammography at Dr. Clementeen Hoof office.  I do not have that report but according to the patient it was unremarkable.  In October 2019 however the patient developed left axillary swelling and tenderness and was again set up for left mammography with tomography and left breast ultrasonography at the [facility], 03/11/2018.  Breast density was category C.  Mammography showed numerous markedly enlarged left axillary lymph nodes.  There was periareolar skin thickening.  There was an area of ill-defined distortion in the upper outer left breast and on physical exam there was a firm lump superior and lateral to the left nipple, with swelling of the left axilla.  Ultrasound of the left breast and both upper quadrants found an ill-defined irregular hypoechoic area  measuring at least 4.2 cm.  There also multiple scattered cysts.  Ultrasound of the left axilla showed at least 6 abnormal lymph nodes.  On 03/16/2018 the patient underwent right mammography with tomography and right breast ultrasonography at the [facility].  Again breast density was category C.  There were multiple circumscribed equal density masses in the upper outer quadrant of the right breast which appeared stable.  Ultrasound showed diffuse fibrocystic changes.  An additional hypoechoic mass was noted at the 9 o'clock position 3 cm from the nipple measuring 0.8 cm, with no associated vascularity.  A complex solid and cystic mass was noted superficially at the 6 o'clock position 3 cm from the nipple measuring 1.6 cm.  Evaluation of the right axilla was negative.  On 03/18/2018 the patient underwent biopsy of the 2 suspicious areas in the right breast, and they both showed only fibrocystic changes with some chronic inflammation ([id number]).  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  The patient's subsequent history is as detailed below.  INTERVAL HISTORY: Daksha returns today for follow-up and treatment of her triple negative breast cancer.   The patient continues on carboplatin and paclitaxel x12. Today she is due for dose #6.  She receives this weekly. She is tolerating treatment well.  She denies peripheral neuropathy.    REVIEW OF SYSTEMS: Aanyah feels like she has come down with "the crud".  This started last Friday.  It started with sore throat and  voice loss.  It then has progressed to where she is coughing, and the cough has been productive with clear sputum.  She has had clear nasal drainage x 2 days.  She not had any sinus pain or pressure.  She has had some  shortness of breath and has been using her inhaler more frequently.  She has not had any pleuritic chest pain.  She denies fevers or chills.  She has not had any body aches.  She note she gets this typically every other year and it resolves with a zpak.  Otherwise, Ajayla is doing well.  A detailed ROS was otherwise non contributory today.       PAST MEDICAL HISTORY: Past Medical History:  Diagnosis Date  . Anemia    had iron infusion 08/12/15  . Asthma   . Cancer [facility])    Breast cancer   . Genital herpes   . GERD (gastroesophageal reflux disease)   . Headache    migraines 2-3 times monthly  . Pneumonia    hospitalizied for pneumonia as a child    PAST SURGICAL HISTORY: Past Surgical History:  Procedure Laterality Date  . ABDOMINAL HYSTERECTOMY Bilateral 08/22/2015   Procedure: HYSTERECTOMY ABDOMINAL, BILATERAL SALPINGECTOMY;  Surgeon: Everlene Farrier, MD;  Location: [facility];  Service: Gynecology;  Laterality: Bilateral;  . MYOMECTOMY ABDOMINAL APPROACH    . PORTACATH PLACEMENT N/A 03/30/2018   Procedure: INSERTION PORT-A-CATH WITH [place];  Surgeon: Rolm Bookbinder, MD;  Location: [facility];  Service: General;  Laterality: N/A;    FAMILY HISTORY Family History  Problem Relation Age of Onset  . Hypertension Unknown   . Diabetes Cousin        mat first cousin  . Cancer Unknown   . Dementia Maternal Grandfather   . Lung cancer Paternal Grandmother        d. 60-60  . Brain cancer Paternal Aunt        d. 80, father's mat 1/2 sister  . Diabetes Maternal Grandmother   . Other Paternal Grandfather        d. WWII  . Aneurysm Maternal Uncle        brain  . Migraines Neg Hx   The patient's parents are in their early 35s as of October 2019.  A paternal aunt had brain cancer at age 31.  A paternal grandmother had lung cancer at age 69.  There is no breast or ovarian cancer history in the family.  However note that the patient has little information on her mother side of the  family.  GYNECOLOGIC HISTORY:  No LMP recorded. Patient has had a hysterectomy. 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  Her husband [name]. works for the [employer].  He is a former patient here, with stage IV colon cancer.  They live alone, with no pets.     ADVANCED DIRECTIVES: Not in place   HEALTH MAINTENANCE: Social History   Tobacco Use  . Smoking status: Never Smoker  . Smokeless tobacco: Never Used  Substance Use Topics  . Alcohol use: Yes    Alcohol/week: 1.0 standard drinks    Types: 1 Glasses of wine per week    Comment: daily  . Drug use: No  Colonoscopy: Never  PAP: Status post hysterectomy  Bone density: Never   Allergies  Allergen Reactions  . Neosporin [Neomycin-Bacitracin Zn-Polymyx] Other (See Comments)    Decreased wound healing with blisters    Current Outpatient Medications  Medication Sig Dispense Refill  . acetaminophen (TYLENOL) 500 MG tablet Take 1,000 mg by mouth every 8 (eight) hours as needed for moderate pain.    Marland Kitchen acyclovir ointment (ZOVIRAX) 5 % Apply 1 application topically 4 (four) times daily as needed (outbreaks).   2  . albuterol (PROVENTIL HFA;VENTOLIN HFA) 108 (90 Base) MCG/ACT inhaler Inhale 2 puffs into the lungs every 6 (six) hours as needed for wheezing or shortness of breath.     . ciprofloxacin (CIPRO) 500 MG tablet Take 1 tablet (500 mg total) by mouth 2 (two) times daily. 14 tablet 0  . dexamethasone (DECADRON) 4 MG tablet Take 2 tablets by mouth once a day on the day after chemotherapy and then take 2 tablets two times a day for 2 days. Take with food. 30 tablet 1  . lidocaine-prilocaine (EMLA) cream Apply to affected area once 30 g 3  .  LORazepam (ATIVAN) 0.5 MG tablet Take 1 tablet (0.5 mg total) by mouth at bedtime as needed (Nausea or vomiting). 30 tablet 0  . methocarbamol (ROBAXIN) 500 MG tablet Take 500 mg by mouth 3 (three) times daily as needed for muscle spasms (pain).    . montelukast (SINGULAIR) 10 MG tablet Take 10 mg by mouth every evening.    Marland Kitchen oxyCODONE (OXY IR/ROXICODONE) 5 MG immediate release tablet Take 1 tablet (5 mg total) by mouth every 6 (six) hours as needed for moderate pain, severe pain or breakthrough pain. 8 tablet 0  . pantoprazole (PROTONIX) 40 MG tablet Take 40 mg by mouth every evening.    . prochlorperazine (COMPAZINE) 10 MG tablet Take 1 tablet (10 mg total) by mouth every 6 (six) hours as needed (Nausea or vomiting). 30 tablet 1  . SYMBICORT 160-4.5 MCG/ACT inhaler INL 2 PFS PO BID  3  . valACYclovir (VALTREX) 500 MG tablet Take 500 mg by mouth See admin instructions. Take 500 mg twice daily for 3 days at onset of outbreak then take 500 mg once daily until clear     No current facility-administered medications for this visit.     OBJECTIVE: Middle-aged African-American woman in no acute distress Vitals:   07/07/18 1120  BP: 108/74  Pulse: (!) 105  Resp: 18  Temp: 98.5 F (36.9 C)  SpO2: 100%     Body mass index is 30.69 kg/m.   Wt Readings from Last 3 Encounters:  07/07/18 207 lb 12.8 oz (94.3 kg)  06/23/18 207 lb 4.8 oz (94 kg)  06/09/18 205 lb 8 oz (93.2 kg)  ECOG FS:2 GENERAL: Patient is a well appearing female in no acute distress HEENT:  Sclerae anicteric.  Oropharynx clear and moist. No ulcerations or evidence of oropharyngeal candidiasis. Neck is supple.  NODES:  No cervical, supraclavicular, or axillary lymphadenopathy palpated.  BREAST EXAM:  Unable to palpate mass in left upper outer quadrant LUNGS:  Very slight occ wheeze bilaterally HEART:  Regular rate and rhythm. No murmur appreciated. ABDOMEN:  Soft, nontender.  Positive, normoactive bowel sounds. No organomegaly  palpated. MSK:  No focal spinal tenderness to palpation. Full range of motion bilaterally in the upper extremities. EXTREMITIES:  No peripheral edema.   SKIN:  Clear with no obvious rashes or skin changes. No nail dyscrasia. NEURO:  Nonfocal. Well  oriented.  Appropriate affect.     LAB RESULTS:  CMP     Component Value Date/Time   NA 142 06/30/2018 0834   K 3.9 06/30/2018 0834   CL 107 06/30/2018 0834   CO2 25 06/30/2018 0834   GLUCOSE 96 06/30/2018 0834   BUN 7 06/30/2018 0834   CREATININE 0.62 06/30/2018 0834   CREATININE 0.67 03/23/2018 0855   CALCIUM 9.2 06/30/2018 0834   PROT 6.9 06/30/2018 0834   ALBUMIN 3.8 06/30/2018 0834   AST 11 (L) 06/30/2018 0834   AST 15 03/23/2018 0855   ALT 13 06/30/2018 0834   ALT 17 03/23/2018 0855   ALKPHOS 69 06/30/2018 0834   BILITOT 0.8 06/30/2018 0834   BILITOT 1.0 03/23/2018 0855   GFRNONAA >60 06/30/2018 0834   GFRNONAA >60 03/23/2018 0855   GFRAA >60 06/30/2018 0834   GFRAA >60 03/23/2018 0855    No results found for: Ronnald Ramp, A1GS, A2GS, BETS, BETA2SER, GAMS, MSPIKE, SPEI  No results found for: Nils Pyle, [facility]  Lab Results  Component Value Date   WBC 4.7 07/07/2018   NEUTROABS 3.3 07/07/2018   HGB 11.2 (L) 07/07/2018   HCT 34.4 (L) 07/07/2018   MCV 101.2 (H) 07/07/2018   PLT 177 07/07/2018      Chemistry      Component Value Date/Time   NA 142 06/30/2018 0834   K 3.9 06/30/2018 0834   CL 107 06/30/2018 0834   CO2 25 06/30/2018 0834   BUN 7 06/30/2018 0834   CREATININE 0.62 06/30/2018 0834   CREATININE 0.67 03/23/2018 0855      Component Value Date/Time   CALCIUM 9.2 06/30/2018 0834   ALKPHOS 69 06/30/2018 0834   AST 11 (L) 06/30/2018 0834   AST 15 03/23/2018 0855   ALT 13 06/30/2018 0834   ALT 17 03/23/2018 0855   BILITOT 0.8 06/30/2018 0834   BILITOT 1.0 03/23/2018 0855       No results found for: LABCA2  No components found for: JOACZY606  No results for  input(s): INR in the last 168 hours.  No results found for: LABCA2  No results found for: CAN199  No results found for: TKZ601  No results found for: UXN235  Lab Results  Component Value Date   CA2729 213.7 (H) 04/07/2018    No components found for: HGQUANT  No results found for: CEA1 / No results found for: CEA1   No results found for: AFPTUMOR  No results found for: [place]  No results found for: PSA1  Appointment on 07/07/2018  Component Date Value Ref Range Status  . WBC 07/07/2018 4.7  4.0 - 10.5 K/uL Final  . RBC 07/07/2018 3.40* 3.87 - 5.11 MIL/uL Final  . Hemoglobin 07/07/2018 11.2* 12.0 - 15.0 g/dL Final  . HCT 07/07/2018 34.4* 36.0 - 46.0 % Final  . MCV 07/07/2018 101.2* 80.0 - 100.0 fL Final  . MCH 07/07/2018 32.9  26.0 - 34.0 pg Final  . MCHC 07/07/2018 32.6  30.0 - 36.0 g/dL Final  . RDW 07/07/2018 17.7* 11.5 - 15.5 % Final  . Platelets 07/07/2018 177  150 - 400 K/uL Final  . nRBC 07/07/2018 0.0  0.0 - 0.2 % Final  . Neutrophils Relative % 07/07/2018 72  % Final  . Neutro Abs 07/07/2018 3.3  1.7 - 7.7 K/uL Final  . Lymphocytes Relative 07/07/2018 16  % Final  . Lymphs Abs 07/07/2018 0.7  0.7 - 4.0 K/uL Final  . Monocytes Relative 07/07/2018 11  %  Final  . Monocytes Absolute 07/07/2018 0.5  0.1 - 1.0 K/uL Final  . Eosinophils Relative 07/07/2018 1  % Final  . Eosinophils Absolute 07/07/2018 0.1  0.0 - 0.5 K/uL Final  . Basophils Relative 07/07/2018 0  % Final  . Basophils Absolute 07/07/2018 0.0  0.0 - 0.1 K/uL Final  . Immature Granulocytes 07/07/2018 0  % Final  . Abs Immature Granulocytes 07/07/2018 0.02  0.00 - 0.07 K/uL Final   Performed at [facility], [place] [address], [place], [place] [address]    (this displays the last labs from the last 3 days)  No results found for: TOTALPROTELP, ALBUMINELP, A1GS, A2GS, BETS, BETA2SER, GAMS, MSPIKE, SPEI (this displays SPEP labs)  No results found for: KPAFRELGTCHN, LAMBDASER,  KAPLAMBRATIO (kappa/lambda light chains)  No results found for: HGBA, HGBA2QUANT, HGBFQUANT, HGBSQUAN (Hemoglobinopathy evaluation)   No results found for: LDH  No results found for: IRON, TIBC, IRONPCTSAT (Iron and TIBC)  No results found for: FERRITIN  Urinalysis No results found for: COLORURINE, APPEARANCEUR, LABSPEC, PHURINE, GLUCOSEU, HGBUR, BILIRUBINUR, KETONESUR, PROTEINUR, UROBILINOGEN, NITRITE, LEUKOCYTESUR   STUDIES: US Breast Ltd Uni Left Inc Axilla  Result Date: 06/07/2018 CLINICAL DATA:  48 year old female presenting for ultrasound evaluation of the known malignancy in her left breast. She is currently undergoing neoadjuvant chemotherapy and is having new pain in the left breast. She feels that the swelling and fullness of her left axilla has significantly improved. EXAM: ULTRASOUND OF THE LEFT BREAST COMPARISON:  Previous exam(s). FINDINGS: Ultrasound of the superior left breast demonstrates the area of biopsy at 1 o'clock, 1 cm from the nipple. There is also persistent ill-defined hypoechoic tissue in the superior left breast at 12 o'clock 1-2 cm from the nipple. Due to the ill-defined nature of the mass is difficult to assess difference in size, however visually the mass does appear less dense than on prior exam. Ultrasound of the left axilla demonstrates several thickened lymph nodes, which have overall significantly decreased in size. The biopsied lymph node in the left axilla is seen, whose cortex now measures 0.4 cm, previously 1.3 cm. IMPRESSION: 1. Due to the ill-defined irregular appearance of the known malignancy in the left breast, accurate assessment of response to neoadjuvant chemotherapy based on size is difficult. The mass does appear overall less dense. 2. Marked decrease in size of the bulky left axillary lymphadenopathy. RECOMMENDATION: If a more accurate assessment of the differences size is desired to determine response to neoadjuvant chemotherapy, MRI is  recommended. I have discussed the findings and recommendations with the patient. Results were also provided in writing at the conclusion of the visit. If applicable, a reminder letter will be sent to the patient regarding the next appointment. BI-RADS CATEGORY  6: Known biopsy-proven malignancy. Electronically Signed   By: Ammie Ferrier M.D.   On: 06/07/2018 13:59     ELIGIBLE FOR AVAILABLE RESEARCH PROTOCOL:  Possibly [id number]  ASSESSMENT: 48 y.o. [place] woman status post left breast overlapping site biopsy and left axillary lymph node biopsy 03/16/2018 for a clinical T2 N3, stage IIIC invasive ductal carcinoma, triple negative, with an MIB-1 of 15%   (1) Staging studies  (a) CT chest on 04/05/2018: left axillary and chronic left retropectoral adenopathy, supraclavicular adenopathy, left breast skin thickening and mass, 32m left upper lobe pulmonary nodule (repeat ct chest in 3-6 months recommended), bronchiectasis in left lower lobe.  (b) CT neck on 04/05/2018: left supraclavicular adenopathy, ? Abnormality in left frontal lobe of brain  (  c) Bone scan on 04/05/2018: no findings suggestive of metastatic disease to bone  (d) MRI brain 04/07/2018: negative  (2) neoadjuvant chemotherapy consisting of doxorubicin and cyclophosphamide in dose dense fashion x4 started 04/07/2018 and completed 05/19/2018,  followed by weekly carboplatin and paclitaxel x12 starting 06/02/2018  (3) genetics testing 11/18: no deleterious mutations.  Genes tested include: AIP, ALK, APC, ATM, AXIN2,BAP1,  BARD1, BLM, BMPR1A, BRCA1, BRCA2, BRIP1, CASR, CDC73, CDH1, CDK4, CDKN1B, CDKN1C, CDKN2A (p14ARF), CDKN2A (p16INK4a), CEBPA, CHEK2, CTNNA1, DICER1, DIS3L2, EGFR (c.2369C>T, p.Thr790Met variant only), EPCAM (Deletion/duplication testing only), FH, FLCN, GATA2, GPC3, GREM1 (Promoter region deletion/duplication testing only), HOXB13 (c.251G>A, p.Gly84Glu), HRAS, KIT, MAX, MEN1, MET, MITF (c.952G>A, p.Glu318Lys variant only),  MLH1, MSH2, MSH3, MSH6, MUTYH, NBN, NF1, NF2, NTHL1, PALB2, PDGFRA, PHOX2B, PMS2, POLD1, POLE, POT1, PRKAR1A, PTCH1, PTEN, RAD50, RAD51C, RAD51D, RB1, RECQL4, RET, RUNX1, SDHAF2, SDHA (sequence changes only), SDHB, SDHC, SDHD, SMAD4, SMARCA4, SMARCB1, SMARCE1, STK11, SUFU, TERC, TERT, TMEM127, TP53, TSC1, TSC2, VHL, WRN and WT1.  (4) definitive surgery to follow  (5) adjuvant radiation   PLAN: Zaylin is doing moderately well today.  She has continued to tolerate her chemotherapy well.  Her labs remain stable and she has not developed peripheral neuropathy.  She will proceed with treatment today.  I reviewed with Dyna her constellation of symptoms today and we got a STAT chest xray to rule out pneumonia prior to her receiving chemotherapy.  She was recommended to use her symbicort BID, her albuterol every 4 hours as needed, and I prescribed a Z pak and Tussionex for her to take.  She knows to let me know if she develops any other symptoms.  I did not call in a prednisone taper at this point, but let her know that if things don't start improving in the next day or two, that I certainly could.  Alberta will return weekly for labs and her chemotherapy with Paclitaxel and Carboplatin.  Myself or Dr. Jana Hakim will see her with every other treatment.  She knows to call for any other issue that may develop before the next scheduled visit.  A total of (30) minutes of face-to-face time was spent with this patient with greater than 50% of that time in counseling and care-coordination.  I reviewed the above with Dr. Jana Hakim in its entirety who is in agreement with the above plan.     Wilber Bihari, NP  07/07/18 11:30 AM Medical Oncology and Hematology [facility] [address] [place], [place] [address] Tel. [phone]    Fax. [phone]

## 2018-07-14 ENCOUNTER — Inpatient Hospital Stay: Payer: BC Managed Care – PPO

## 2018-07-14 ENCOUNTER — Other Ambulatory Visit: Payer: Self-pay | Admitting: Adult Health

## 2018-07-14 ENCOUNTER — Other Ambulatory Visit: Payer: Self-pay | Admitting: Oncology

## 2018-07-14 VITALS — BP 108/75 | HR 100 | Temp 97.9°F | Resp 18

## 2018-07-14 DIAGNOSIS — C50412 Malignant neoplasm of upper-outer quadrant of left female breast: Secondary | ICD-10-CM

## 2018-07-14 DIAGNOSIS — Z171 Estrogen receptor negative status [ER-]: Principal | ICD-10-CM

## 2018-07-14 DIAGNOSIS — R05 Cough: Secondary | ICD-10-CM

## 2018-07-14 DIAGNOSIS — R059 Cough, unspecified: Secondary | ICD-10-CM

## 2018-07-14 DIAGNOSIS — Z95828 Presence of other vascular implants and grafts: Secondary | ICD-10-CM

## 2018-07-14 LAB — CBC WITH DIFFERENTIAL/PLATELET
Abs Immature Granulocytes: 0.01 10*3/uL (ref 0.00–0.07)
Basophils Absolute: 0 10*3/uL (ref 0.0–0.1)
Basophils Relative: 0 %
Eosinophils Absolute: 0 10*3/uL (ref 0.0–0.5)
Eosinophils Relative: 0 %
HCT: 35 % — ABNORMAL LOW (ref 36.0–46.0)
Hemoglobin: 11.5 g/dL — ABNORMAL LOW (ref 12.0–15.0)
IMMATURE GRANULOCYTES: 0 %
Lymphocytes Relative: 24 %
Lymphs Abs: 0.6 10*3/uL — ABNORMAL LOW (ref 0.7–4.0)
MCH: 33.3 pg (ref 26.0–34.0)
MCHC: 32.9 g/dL (ref 30.0–36.0)
MCV: 101.4 fL — ABNORMAL HIGH (ref 80.0–100.0)
Monocytes Absolute: 0.3 10*3/uL (ref 0.1–1.0)
Monocytes Relative: 12 %
NEUTROS ABS: 1.7 10*3/uL (ref 1.7–7.7)
NEUTROS PCT: 64 %
Platelets: 185 10*3/uL (ref 150–400)
RBC: 3.45 MIL/uL — ABNORMAL LOW (ref 3.87–5.11)
RDW: 17 % — ABNORMAL HIGH (ref 11.5–15.5)
WBC: 2.7 10*3/uL — ABNORMAL LOW (ref 4.0–10.5)
nRBC: 0 % (ref 0.0–0.2)

## 2018-07-14 LAB — COMPREHENSIVE METABOLIC PANEL
ALT: 14 U/L (ref 0–44)
ANION GAP: 9 (ref 5–15)
AST: 14 U/L — ABNORMAL LOW (ref 15–41)
Albumin: 3.9 g/dL (ref 3.5–5.0)
Alkaline Phosphatase: 72 U/L (ref 38–126)
BUN: 11 mg/dL (ref 6–20)
CO2: 27 mmol/L (ref 22–32)
Calcium: 9.4 mg/dL (ref 8.9–10.3)
Chloride: 104 mmol/L (ref 98–111)
Creatinine, Ser: 0.68 mg/dL (ref 0.44–1.00)
GFR calc Af Amer: >60 mL/min (ref >60)
GFR calc non Af Amer: >60 mL/min (ref >60)
Glucose, Bld: 99 mg/dL (ref 70–99)
Potassium: 4 mmol/L (ref 3.5–5.1)
SODIUM: 140 mmol/L (ref 135–145)
Total Bilirubin: 0.9 mg/dL (ref 0.3–1.2)
Total Protein: 7 g/dL (ref 6.5–8.1)

## 2018-07-14 MED ORDER — SODIUM CHLORIDE 0.9% FLUSH
10.0000 mL | INTRAVENOUS | Status: DC | PRN
  Administered 2018-07-14: 10 mL
  Filled 2018-07-14: qty 10

## 2018-07-14 MED ORDER — HEPARIN SOD (PORK) LOCK FLUSH 100 UNIT/ML IV SOLN
500.0000 [IU] | Freq: Once | INTRAVENOUS | Status: AC | PRN
  Administered 2018-07-14: 500 [IU]
  Filled 2018-07-14: qty 5

## 2018-07-14 MED ORDER — SODIUM CHLORIDE 0.9 % IV SOLN
Freq: Once | INTRAVENOUS | Status: AC
  Administered 2018-07-14: 13:00:00 via INTRAVENOUS
  Filled 2018-07-14: qty 5

## 2018-07-14 MED ORDER — FAMOTIDINE IN NACL 20-0.9 MG/50ML-% IV SOLN
INTRAVENOUS | Status: AC
  Filled 2018-07-14: qty 50

## 2018-07-14 MED ORDER — SODIUM CHLORIDE 0.9 % IV SOLN
80.0000 mg/m2 | Freq: Once | INTRAVENOUS | Status: AC
  Administered 2018-07-14: 174 mg via INTRAVENOUS
  Filled 2018-07-14: qty 29

## 2018-07-14 MED ORDER — HYDROCOD POLST-CPM POLST ER 10-8 MG/5ML PO SUER: 5.0000 mL | Freq: Two times a day (BID) | ORAL | 0 refills | Status: DC | PRN

## 2018-07-14 MED ORDER — PALONOSETRON HCL INJECTION 0.25 MG/5ML
0.2500 mg | Freq: Once | INTRAVENOUS | Status: AC
  Administered 2018-07-14: 0.25 mg via INTRAVENOUS

## 2018-07-14 MED ORDER — SODIUM CHLORIDE 0.9% FLUSH
10.0000 mL | Freq: Once | INTRAVENOUS | Status: AC
  Administered 2018-07-14: 10 mL
  Filled 2018-07-14: qty 10

## 2018-07-14 MED ORDER — DIPHENHYDRAMINE HCL 50 MG/ML IJ SOLN
25.0000 mg | Freq: Once | INTRAMUSCULAR | Status: AC
  Administered 2018-07-14: 25 mg via INTRAVENOUS

## 2018-07-14 MED ORDER — FAMOTIDINE IN NACL 20-0.9 MG/50ML-% IV SOLN
20.0000 mg | Freq: Once | INTRAVENOUS | Status: AC
  Administered 2018-07-14: 20 mg via INTRAVENOUS

## 2018-07-14 MED ORDER — SODIUM CHLORIDE 0.9 % IV SOLN
300.0000 mg | Freq: Once | INTRAVENOUS | Status: AC
  Administered 2018-07-14: 300 mg via INTRAVENOUS
  Filled 2018-07-14: qty 30

## 2018-07-14 MED ORDER — DIPHENHYDRAMINE HCL 25 MG PO CAPS
ORAL_CAPSULE | ORAL | Status: AC
  Filled 2018-07-14: qty 1

## 2018-07-14 MED ORDER — SODIUM CHLORIDE 0.9 % IV SOLN
Freq: Once | INTRAVENOUS | Status: AC
  Administered 2018-07-14: 13:00:00 via INTRAVENOUS
  Filled 2018-07-14: qty 250

## 2018-07-14 MED ORDER — PALONOSETRON HCL INJECTION 0.25 MG/5ML
INTRAVENOUS | Status: AC
  Filled 2018-07-14: qty 5

## 2018-07-14 MED ORDER — DIPHENHYDRAMINE HCL 50 MG/ML IJ SOLN
INTRAMUSCULAR | Status: AC
  Filled 2018-07-14: qty 1

## 2018-07-14 NOTE — Patient Instructions (Signed)
   [facility] Discharge Instructions for Patients Receiving Chemotherapy  Today you received the following chemotherapy agents Taxol and Carboplatin   To help prevent nausea and vomiting after your treatment, we encourage you to take your nausea medication as directed.    If you develop nausea and vomiting that is not controlled by your nausea medication, call the clinic.   BELOW ARE SYMPTOMS THAT SHOULD BE REPORTED IMMEDIATELY:  *FEVER GREATER THAN 100.5 F  *CHILLS WITH OR WITHOUT FEVER  NAUSEA AND VOMITING THAT IS NOT CONTROLLED WITH YOUR NAUSEA MEDICATION  *UNUSUAL SHORTNESS OF BREATH  *UNUSUAL BRUISING OR BLEEDING  TENDERNESS IN MOUTH AND THROAT WITH OR WITHOUT PRESENCE OF ULCERS  *URINARY PROBLEMS  *BOWEL PROBLEMS  UNUSUAL RASH Items with * indicate a potential emergency and should be followed up as soon as possible.  Feel free to call the clinic should you have any questions or concerns. The clinic phone number is [phone].  Please show the CHEMO ALERT CARD at check-in to the Emergency Department and triage nurse.   

## 2018-07-14 NOTE — Progress Notes (Signed)
Examined patient in treatment room.  Listened to lungs bilaterally which are improved from her last visit.  She still notes a lingering cough.  I refilled her tussionex.  This will be last refill.  She will continue using inhalers.  Let her know that the cough will sometimes linger.  She knows to call for worsening symptoms.  She will be ok to receive treatment today.  Tussionex script e scribed, and reviewed prescription necessity with Dr. Jana Hakim.    Wilber Bihari, NP

## 2018-07-20 NOTE — Progress Notes (Signed)
[facility]  Telephone:([phone] Fax:([phone]) [phone]    ID: Marcille Barman DOB: [birth year]  MR#: [id number]  KAJ#:[id number]  Patient Care Team: Vernie Shanks, MD as PCP - General (Family Medicine) Rolm Bookbinder, MD as Consulting Physician (General Surgery) Magrinat, Virgie Dad, MD as Consulting Physician (Oncology) Eppie Gibson, MD as Attending Physician (Radiation Oncology) Everlene Farrier, MD as Consulting Physician (Obstetrics and Gynecology) OTHER MD:   CHIEF COMPLAINT: Triple negative breast cancer  CURRENT TREATMENT: Neoadjuvant chemotherapy: Carboplatin and paclitaxel x12   HISTORY OF CURRENT ILLNESS: From the original intake note:  The patient had a left mammogram with tomography and ultrasonography 07/12/2015 at the [facility] after screening recall for a possible left breast mass.  This found the breast density to be category C.  There was a small lobulated mass in the lower outer aspect of the left breast, which was not palpable.  Ultrasound showed a small lobulated cyst in that area measuring 0.6 cm.  This was felt to be benign.  In July 2019 she had screening mammography at Dr. Clementeen Hoof office.  I do not have that report but according to the patient it was unremarkable.  In October 2019 however the patient developed left axillary swelling and tenderness and was again set up for left mammography with tomography and left breast ultrasonography at the [facility], 03/11/2018.  Breast density was category C.  Mammography showed numerous markedly enlarged left axillary lymph nodes.  There was periareolar skin thickening.  There was an area of ill-defined distortion in the upper outer left breast and on physical exam there was a firm lump superior and lateral to the left nipple, with swelling of the left axilla.  Ultrasound of the left breast and both upper quadrants found an ill-defined irregular hypoechoic area measuring at least 4.2 cm.   There also multiple scattered cysts.  Ultrasound of the left axilla showed at least 6 abnormal lymph nodes.  On 03/16/2018 the patient underwent right mammography with tomography and right breast ultrasonography at the [facility].  Again breast density was category C.  There were multiple circumscribed equal density masses in the upper outer quadrant of the right breast which appeared stable.  Ultrasound showed diffuse fibrocystic changes.  An additional hypoechoic mass was noted at the 9 o'clock position 3 cm from the nipple measuring 0.8 cm, with no associated vascularity.  A complex solid and cystic mass was noted superficially at the 6 o'clock position 3 cm from the nipple measuring 1.6 cm.  Evaluation of the right axilla was negative.  On 03/18/2018 the patient underwent biopsy of the 2 suspicious areas in the right breast, and they both showed only fibrocystic changes with some chronic inflammation ([id number]).  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  The patient's subsequent history is as detailed below.   INTERVAL HISTORY: Mycah returns today for follow-up and treatment of her triple negative breast cancer.   She continues on neoadjuvant chemotherapy consisting of weekly carboplatin and paclitaxel x12. Today is day 1 cycle 8. Monday's (day 5) following treatment seem to be the worse day; she feels like she's in a fog and that she's just trudging through. She doesn't feel bad, but she doesn't feel good either. Tuesday's she's feeling better, and by Wednesday she feels  a lot closer to normal. She has some soreness on her nails, but she does not have neuropathy in her fingers. She notes that opening packets of sauce or soda can tabs are a little more difficult than  normal.   Since her last visit here, she underwent a chest xray on 07/07/2018 for a cough with shortness of breath showing No active cardiopulmonary disease. Chronic varicoid left lower lobe bronchiectasis and peribronchial thickening/scarring.   REVIEW OF SYSTEMS: Claudette's cough from the previous visit has resolved. She took an antibiotic and tussinex to treat as prescribed. The patient denies unusual headaches, visual changes, nausea, vomiting, or dizziness. There has been no phlegm production, or pleurisy. This been no change in bowel or bladder habits. The patient denies unexplained fatigue or unexplained weight loss, bleeding, rash, or fever. A detailed review of systems was otherwise noncontributory.     PAST MEDICAL HISTORY: Past Medical History:  Diagnosis Date  . Anemia    had iron infusion 08/12/15  . Asthma   . Cancer [facility])    Breast cancer   . Genital herpes   . GERD (gastroesophageal reflux disease)   . Headache    migraines 2-3 times monthly  . Pneumonia    hospitalizied for pneumonia as a child    PAST SURGICAL HISTORY: Past Surgical History:  Procedure Laterality Date  . ABDOMINAL HYSTERECTOMY Bilateral 08/22/2015   Procedure: HYSTERECTOMY ABDOMINAL, BILATERAL SALPINGECTOMY;  Surgeon: Everlene Farrier, MD;  Location: [facility];  Service: Gynecology;  Laterality: Bilateral;  . MYOMECTOMY ABDOMINAL APPROACH    . PORTACATH PLACEMENT N/A 03/30/2018   Procedure: INSERTION PORT-A-CATH WITH [place];  Surgeon: Rolm Bookbinder, MD;  Location: [facility];  Service: General;  Laterality: N/A;    FAMILY HISTORY: Family History  Problem Relation Age of Onset  . Hypertension Unknown   . Diabetes Cousin        mat first cousin  . Cancer Unknown   . Dementia Maternal Grandfather   . Lung cancer Paternal Grandmother        d. 34-60  . Brain cancer Paternal Aunt        d. 69, father's mat 1/2 sister  . Diabetes Maternal Grandmother   . Other Paternal Grandfather        d. WWII  .  Aneurysm Maternal Uncle        brain  . Migraines Neg Hx    The patient's parents are in their early 70s as of October 2019.  A paternal aunt had brain cancer at age 72.  A paternal grandmother had lung cancer at age 13.  There is no breast or ovarian cancer history in the family.  However note that the patient has little information on her mother side of the family.   GYNECOLOGIC HISTORY:  No LMP recorded. Patient has had a hysterectomy. 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  Her husband [name]. works for the [employer].  He is a former patient here, with stage IV colon cancer.  They live alone, with no pets.     ADVANCED DIRECTIVES: Not in place   HEALTH MAINTENANCE: Social History   Tobacco Use  . Smoking status: Never Smoker  . Smokeless tobacco: Never Used  Substance Use Topics  . Alcohol use: Yes    Alcohol/week: 1.0 standard drinks    Types: 1 Glasses of wine per week    Comment: daily  . Drug use: No     Colonoscopy: Never  PAP: Status post hysterectomy  Bone density: Never   Allergies  Allergen Reactions  . Neosporin [Neomycin-Bacitracin Zn-Polymyx] Other (See Comments)    Decreased wound healing with blisters    Current Outpatient Medications  Medication Sig Dispense Refill  . acetaminophen (TYLENOL) 500 MG tablet Take 1,000 mg by mouth every 8 (eight) hours as needed for moderate pain.    Marland Kitchen acyclovir ointment (ZOVIRAX) 5 % Apply 1 application topically 4 (four) times daily as needed (outbreaks).   2  . albuterol (PROVENTIL HFA;VENTOLIN HFA) 108 (90 Base) MCG/ACT inhaler Inhale 2 puffs into the lungs every 6 (six) hours as needed for wheezing or shortness of breath.     Marland Kitchen  azithromycin (ZITHROMAX) 250 MG tablet 2 tab on day 1 then 1 tab daily until complete 6 each 0  . chlorpheniramine-HYDROcodone (TUSSIONEX) 10-8 MG/5ML SUER Take 5 mLs by mouth every 12 (twelve) hours as needed for cough. 140 mL 0  . ciprofloxacin (CIPRO) 500 MG tablet Take 1 tablet (500 mg total) by mouth 2 (two) times daily. 14 tablet 0  . dexamethasone (DECADRON) 4 MG tablet Take 2 tablets by mouth once a day on the day after chemotherapy and then take 2 tablets two times a day for 2 days. Take with food. 30 tablet 1  . lidocaine-prilocaine (EMLA) cream Apply to affected area once 30 g 3  . LORazepam (ATIVAN) 0.5 MG tablet Take 1 tablet (0.5 mg total) by mouth at bedtime as needed (Nausea or vomiting). 30 tablet 0  . methocarbamol (ROBAXIN) 500 MG tablet Take 500 mg by mouth 3 (three) times daily as needed for muscle spasms (pain).    . montelukast (SINGULAIR) 10 MG tablet Take 10 mg by mouth every evening.    Marland Kitchen oxyCODONE (OXY IR/ROXICODONE) 5 MG immediate release tablet Take 1 tablet (5 mg total) by mouth every 6 (six) hours as needed for moderate pain, severe pain or breakthrough pain. 8 tablet 0  . pantoprazole (PROTONIX) 40 MG tablet Take 40 mg by mouth every evening.    . prochlorperazine (COMPAZINE) 10 MG tablet Take 1 tablet (10 mg total) by mouth every 6 (six) hours as needed (Nausea or vomiting). 30 tablet 1  . SYMBICORT 160-4.5 MCG/ACT inhaler INL 2 PFS PO BID  3  . valACYclovir (VALTREX) 500 MG tablet Take 500 mg by mouth See admin instructions. Take 500 mg twice daily for 3 days at onset of outbreak then take 500 mg once daily until clear     No current facility-administered medications for this visit.    Facility-Administered Medications Ordered in Other Visits  Medication Dose Route Frequency Provider Last Rate Last Dose  . sodium chloride flush (NS) 0.9 % injection 10 mL  10 mL Intracatheter Once Magrinat, Virgie Dad, MD        OBJECTIVE: Middle-aged African-American woman who  appears well  Vitals:   07/21/18 0925  BP: 109/84  Pulse: (!) 110  Resp: 18  Temp: 98 F (36.7 C)  SpO2: 100%     Body mass index is 30.49 kg/m.   Wt Readings from Last 3 Encounters:  07/21/18 206 lb 8 oz (93.7 kg)  07/07/18 207 lb 12.8 oz (94.3 kg)  06/23/18 207 lb 4.8 oz (94 kg)  ECOG FS: 1 - Symptomatic but completely ambulatory  Sclerae unicteric, EOMs intact Oropharynx clear and moist No cervical or supraclavicular adenopathy Lungs no rales or rhonchi Heart regular rate and rhythm Abd soft, nontender, positive bowel sounds MSK no focal spinal tenderness, no upper extremity lymphedema Neuro: nonfocal, well oriented, appropriate affect Breasts: The right breast is unremarkable.  Careful inspection of the left breast, left axilla and left supraclavicular area show absolutely no suspicious finding.  It is a normal exam.     LAB RESULTS:  CMP     Component Value Date/Time   NA 140 07/14/2018 1147   K 4.0 07/14/2018 1147   CL 104 07/14/2018 1147   CO2 27 07/14/2018 1147   GLUCOSE 99 07/14/2018 1147   BUN 11 07/14/2018 1147   CREATININE 0.68 07/14/2018 1147   CREATININE 0.67 03/23/2018 0855   CALCIUM 9.4 07/14/2018 1147   PROT 7.0 07/14/2018 1147   ALBUMIN 3.9 07/14/2018 1147   AST 14 (L) 07/14/2018 1147   AST 15 03/23/2018 0855   ALT 14 07/14/2018 1147   ALT 17 03/23/2018 0855   ALKPHOS 72 07/14/2018 1147   BILITOT 0.9 07/14/2018 1147   BILITOT 1.0 03/23/2018 0855   GFRNONAA >60 07/14/2018 1147   GFRNONAA >60 03/23/2018 0855   GFRAA >60 07/14/2018 1147   GFRAA >60 03/23/2018 0855    No results found for: TOTALPROTELP, ALBUMINELP, A1GS, A2GS, BETS, BETA2SER, GAMS, MSPIKE, SPEI  No results found for: Nils Pyle, [facility]  Lab Results  Component Value Date   WBC 2.1 (L) 07/21/2018   NEUTROABS PENDING 07/21/2018   HGB 11.2 (L) 07/21/2018   HCT 34.3 (L) 07/21/2018   MCV 103.6 (H) 07/21/2018   PLT 189 07/21/2018      Chemistry        Component Value Date/Time   NA 140 07/14/2018 1147   K 4.0 07/14/2018 1147   CL 104 07/14/2018 1147   CO2 27 07/14/2018 1147   BUN 11 07/14/2018 1147   CREATININE 0.68 07/14/2018 1147   CREATININE 0.67 03/23/2018 0855      Component Value Date/Time   CALCIUM 9.4 07/14/2018 1147   ALKPHOS 72 07/14/2018 1147   AST 14 (L) 07/14/2018 1147   AST 15 03/23/2018 0855   ALT 14 07/14/2018 1147   ALT 17 03/23/2018 0855   BILITOT 0.9 07/14/2018 1147   BILITOT 1.0 03/23/2018 0855       No results found for: LABCA2  No components found for: [id number]  No results for input(s): INR in the last 168 hours.  No results found for: LABCA2  No results found for: CAN199  No results found for: XAJ287  No results found for: OMV672  Lab Results  Component Value Date   CA2729 213.7 (H) 04/07/2018    No components found for: HGQUANT  No results found for: CEA1 / No results found for: CEA1   No results found for: AFPTUMOR  No results found for: [place]  No results found for: PSA1  Appointment on 07/21/2018  Component Date Value Ref Range Status  . WBC 07/21/2018 2.1* 4.0 - 10.5 K/uL Final  . RBC 07/21/2018 3.31* 3.87 - 5.11 MIL/uL Final  . Hemoglobin 07/21/2018 11.2* 12.0 - 15.0 g/dL Final  . HCT 07/21/2018 34.3* 36.0 - 46.0 % Final  . MCV 07/21/2018 103.6* 80.0 - 100.0 fL Final  . MCH 07/21/2018 33.8  26.0 - 34.0 pg Final  . MCHC 07/21/2018 32.7  30.0 - 36.0 g/dL Final  .  RDW 07/21/2018 16.5* 11.5 - 15.5 % Final  . Platelets 07/21/2018 189  150 - 400 K/uL Final  . nRBC 07/21/2018 0.0  0.0 - 0.2 % Final   Performed at [facility], [place] [address] [place], [place] [address]  . Neutrophils Relative % 07/21/2018 PENDING  % Incomplete  . Neutro Abs 07/21/2018 PENDING  1.7 - 7.7 K/uL Incomplete  . Band Neutrophils 07/21/2018 PENDING  % Incomplete  . Lymphocytes Relative 07/21/2018 PENDING  % Incomplete  . Lymphs Abs 07/21/2018 PENDING  0.7 - 4.0 K/uL  Incomplete  . Monocytes Relative 07/21/2018 PENDING  % Incomplete  . Monocytes Absolute 07/21/2018 PENDING  0.1 - 1.0 K/uL Incomplete  . Eosinophils Relative 07/21/2018 PENDING  % Incomplete  . Eosinophils Absolute 07/21/2018 PENDING  0.0 - 0.5 K/uL Incomplete  . Basophils Relative 07/21/2018 PENDING  % Incomplete  . Basophils Absolute 07/21/2018 PENDING  0.0 - 0.1 K/uL Incomplete  . WBC Morphology 07/21/2018 PENDING   Incomplete  . RBC Morphology 07/21/2018 PENDING   Incomplete  . Smear Review 07/21/2018 PENDING   Incomplete  . Other 07/21/2018 PENDING  % Incomplete  . nRBC 07/21/2018 PENDING  0 /100 WBC Incomplete  . Metamyelocytes Relative 07/21/2018 PENDING  % Incomplete  . Myelocytes 07/21/2018 PENDING  % Incomplete  . Promyelocytes Relative 07/21/2018 PENDING  % Incomplete  . Blasts 07/21/2018 PENDING  % Incomplete    (this displays the last labs from the last 3 days)  No results found for: TOTALPROTELP, ALBUMINELP, A1GS, A2GS, BETS, BETA2SER, GAMS, MSPIKE, SPEI (this displays SPEP labs)  No results found for: KPAFRELGTCHN, LAMBDASER, KAPLAMBRATIO (kappa/lambda light chains)  No results found for: HGBA, HGBA2QUANT, HGBFQUANT, HGBSQUAN (Hemoglobinopathy evaluation)   No results found for: LDH  No results found for: IRON, TIBC, IRONPCTSAT (Iron and TIBC)  No results found for: FERRITIN  Urinalysis No results found for: COLORURINE, APPEARANCEUR, LABSPEC, PHURINE, GLUCOSEU, HGBUR, BILIRUBINUR, KETONESUR, PROTEINUR, UROBILINOGEN, NITRITE, LEUKOCYTESUR   STUDIES: Dg Chest 2 View  Result Date: 07/07/2018 CLINICAL DATA:  Cough and shortness of breath. The patient is being treated with chemotherapy for a left breast cancer. EXAM: CHEST - 2 VIEW COMPARISON:  None. FINDINGS: Right-sided injectable port in stable position. Cardiomediastinal silhouette is normal. Mediastinal contours appear intact. There is no evidence of acute focal airspace consolidation, pleural effusion or  pneumothorax. Left lower lobe varicoid bronchiectasis with peribronchial thickening and subpleural scarring is stable from the prior CT. Osseous structures are without acute abnormality. Soft tissues are grossly normal. IMPRESSION: No active cardiopulmonary disease. Chronic varicoid left lower lobe bronchiectasis and peribronchial thickening/scarring. Electronically Signed   By: Fidela Salisbury M.D.   On: 07/07/2018 12:15    ELIGIBLE FOR AVAILABLE RESEARCH PROTOCOL:  Possibly [id number]   ASSESSMENT: 48 y.o. [place] woman status post left breast overlapping site biopsy and left axillary lymph node biopsy 03/16/2018 for a clinical T2 N3, stage IIIC invasive ductal carcinoma, triple negative, with an MIB-1 of 15%   (1) Staging studies  (a) CT chest on 04/05/2018: left axillary and chronic left retropectoral adenopathy, supraclavicular adenopathy, left breast skin thickening and mass, 68m left upper lobe pulmonary nodule (repeat ct chest in 3-6 months recommended), bronchiectasis in left lower lobe.  (b) CT neck on 04/05/2018: left supraclavicular adenopathy, ? Abnormality in left frontal lobe of brain  (c) Bone scan on 04/05/2018: no findings suggestive of metastatic disease to bone  (d) MRI brain 04/07/2018: negative  (2) neoadjuvant chemotherapy consisting of doxorubicin and cyclophosphamide  in dose dense fashion x4 started 04/07/2018, completed 05/19/2018,  followed by weekly carboplatin and paclitaxel x12 started 06/02/2018  (3) genetics testing 11/18: no deleterious mutations.  Genes tested include: AIP, ALK, APC, ATM, AXIN2,BAP1,  BARD1, BLM, BMPR1A, BRCA1, BRCA2, BRIP1, CASR, CDC73, CDH1, CDK4, CDKN1B, CDKN1C, CDKN2A (p14ARF), CDKN2A (p16INK4a), CEBPA, CHEK2, CTNNA1, DICER1, DIS3L2, EGFR (c.2369C>T, p.Thr790Met variant only), EPCAM (Deletion/duplication testing only), FH, FLCN, GATA2, GPC3, GREM1 (Promoter region deletion/duplication testing only), HOXB13 (c.251G>A, p.Gly84Glu), HRAS, KIT, MAX,  MEN1, MET, MITF (c.952G>A, p.Glu318Lys variant only), MLH1, MSH2, MSH3, MSH6, MUTYH, NBN, NF1, NF2, NTHL1, PALB2, PDGFRA, PHOX2B, PMS2, POLD1, POLE, POT1, PRKAR1A, PTCH1, PTEN, RAD50, RAD51C, RAD51D, RB1, RECQL4, RET, RUNX1, SDHAF2, SDHA (sequence changes only), SDHB, SDHC, SDHD, SMAD4, SMARCA4, SMARCB1, SMARCE1, STK11, SUFU, TERC, TERT, TMEM127, TP53, TSC1, TSC2, VHL, WRN and WT1.  (4) definitive surgery to follow  (5) adjuvant radiation   PLAN: Baleria will proceed to her eighth dose of carboplatin and paclitaxel of 12 planned today.  She is tolerating treatment well and is having no peripheral neuropathy symptoms so far.  She understands the pain she is having at the base of the nails is common, due to chemo, is part of what will turn her nails dark, and that eventually it will completely resolve and she will get a normal nail  She continues to maintain an excellent functional status  It is very favorable that I do not palpate any abnormality in the left breast at this point.  I am hopeful that when we obtain a repeat breast MRI there will be a complete radiologic response  Nevertheless we did discuss S14 18 today and if she has any residual tumor she may qualify for that study.  She would be interested in participating if that were the case.  She tells me she has decided to undergo bilateral mastectomies with no reconstruction.  I think it would be prudent if she met with a plastic surgeon anyway to discuss reconstruction options versus just using prostheses.  I have set her up to discuss that with Dr. Iran Planas in the next week or 2  Otherwise she will see [place] weekly for the last 4 cycles.  She will need her MRI last week in March and follow-up with Dr. [name] after that  She knows to keep [place] posted with any other issues that may develop before then.    Magrinat, Virgie Dad, MD  07/21/18 10:08 AM Medical Oncology and Hematology [facility] [address] [place], [place] [address] Tel. [phone]    Fax. [phone]  I, Jacqualyn Posey am acting as a Education administrator for Chauncey Cruel, MD.   I, Lurline Del MD, have reviewed the above documentation for accuracy and completeness, and I agree with the above.

## 2018-07-21 ENCOUNTER — Encounter: Payer: Self-pay | Admitting: *Deleted

## 2018-07-21 ENCOUNTER — Telehealth: Payer: Self-pay | Admitting: Oncology

## 2018-07-21 ENCOUNTER — Inpatient Hospital Stay (HOSPITAL_BASED_OUTPATIENT_CLINIC_OR_DEPARTMENT_OTHER): Payer: BC Managed Care – PPO | Admitting: Oncology

## 2018-07-21 ENCOUNTER — Inpatient Hospital Stay: Payer: BC Managed Care – PPO

## 2018-07-21 ENCOUNTER — Encounter: Payer: Self-pay | Admitting: Oncology

## 2018-07-21 ENCOUNTER — Telehealth: Payer: Self-pay | Admitting: *Deleted

## 2018-07-21 VITALS — BP 109/84 | HR 110 | Temp 98.0°F | Resp 18 | Ht 69.0 in | Wt 206.5 lb

## 2018-07-21 VITALS — HR 101

## 2018-07-21 DIAGNOSIS — J470 Bronchiectasis with acute lower respiratory infection: Secondary | ICD-10-CM

## 2018-07-21 DIAGNOSIS — C773 Secondary and unspecified malignant neoplasm of axilla and upper limb lymph nodes: Secondary | ICD-10-CM

## 2018-07-21 DIAGNOSIS — J479 Bronchiectasis, uncomplicated: Secondary | ICD-10-CM | POA: Insufficient documentation

## 2018-07-21 DIAGNOSIS — Z171 Estrogen receptor negative status [ER-]: Principal | ICD-10-CM

## 2018-07-21 DIAGNOSIS — C50412 Malignant neoplasm of upper-outer quadrant of left female breast: Secondary | ICD-10-CM | POA: Diagnosis not present

## 2018-07-21 DIAGNOSIS — R590 Localized enlarged lymph nodes: Secondary | ICD-10-CM | POA: Diagnosis not present

## 2018-07-21 DIAGNOSIS — Z95828 Presence of other vascular implants and grafts: Secondary | ICD-10-CM

## 2018-07-21 DIAGNOSIS — R918 Other nonspecific abnormal finding of lung field: Secondary | ICD-10-CM | POA: Diagnosis not present

## 2018-07-21 LAB — CBC WITH DIFFERENTIAL/PLATELET
Abs Immature Granulocytes: 0 10*3/uL (ref 0.00–0.07)
BASOS ABS: 0 10*3/uL (ref 0.0–0.1)
Basophils Relative: 1 %
Eosinophils Absolute: 0 10*3/uL (ref 0.0–0.5)
Eosinophils Relative: 1 %
HCT: 34.3 % — ABNORMAL LOW (ref 36.0–46.0)
Hemoglobin: 11.2 g/dL — ABNORMAL LOW (ref 12.0–15.0)
Immature Granulocytes: 0 %
Lymphocytes Relative: 34 %
Lymphs Abs: 0.7 10*3/uL (ref 0.7–4.0)
MCH: 33.8 pg (ref 26.0–34.0)
MCHC: 32.7 g/dL (ref 30.0–36.0)
MCV: 103.6 fL — ABNORMAL HIGH (ref 80.0–100.0)
Monocytes Absolute: 0.4 10*3/uL (ref 0.1–1.0)
Monocytes Relative: 17 %
NEUTROS PCT: 47 %
NRBC: 0 % (ref 0.0–0.2)
Neutro Abs: 1 10*3/uL — ABNORMAL LOW (ref 1.7–7.7)
Platelets: 189 10*3/uL (ref 150–400)
RBC: 3.31 MIL/uL — AB (ref 3.87–5.11)
RDW: 16.5 % — ABNORMAL HIGH (ref 11.5–15.5)
WBC: 2.1 10*3/uL — ABNORMAL LOW (ref 4.0–10.5)

## 2018-07-21 LAB — COMPREHENSIVE METABOLIC PANEL
ALT: 14 U/L (ref 0–44)
ANION GAP: 11 (ref 5–15)
AST: 14 U/L — ABNORMAL LOW (ref 15–41)
Albumin: 3.9 g/dL (ref 3.5–5.0)
Alkaline Phosphatase: 65 U/L (ref 38–126)
BUN: 8 mg/dL (ref 6–20)
CO2: 25 mmol/L (ref 22–32)
Calcium: 9.1 mg/dL (ref 8.9–10.3)
Chloride: 108 mmol/L (ref 98–111)
Creatinine, Ser: 0.68 mg/dL (ref 0.44–1.00)
GFR calc non Af Amer: >60 mL/min (ref >60)
Glucose, Bld: 91 mg/dL (ref 70–99)
Potassium: 3.9 mmol/L (ref 3.5–5.1)
Sodium: 144 mmol/L (ref 135–145)
Total Bilirubin: 0.8 mg/dL (ref 0.3–1.2)
Total Protein: 6.9 g/dL (ref 6.5–8.1)

## 2018-07-21 MED ORDER — FAMOTIDINE IN NACL 20-0.9 MG/50ML-% IV SOLN
20.0000 mg | Freq: Once | INTRAVENOUS | Status: AC
  Administered 2018-07-21: 20 mg via INTRAVENOUS

## 2018-07-21 MED ORDER — SODIUM CHLORIDE 0.9% FLUSH
10.0000 mL | Freq: Once | INTRAVENOUS | Status: DC
  Filled 2018-07-21: qty 10

## 2018-07-21 MED ORDER — PALONOSETRON HCL INJECTION 0.25 MG/5ML
0.2500 mg | Freq: Once | INTRAVENOUS | Status: AC
  Administered 2018-07-21: 0.25 mg via INTRAVENOUS

## 2018-07-21 MED ORDER — PALONOSETRON HCL INJECTION 0.25 MG/5ML
INTRAVENOUS | Status: AC
  Filled 2018-07-21: qty 5

## 2018-07-21 MED ORDER — SODIUM CHLORIDE 0.9 % IV SOLN
Freq: Once | INTRAVENOUS | Status: AC
  Administered 2018-07-21: 11:00:00 via INTRAVENOUS
  Filled 2018-07-21: qty 250

## 2018-07-21 MED ORDER — SODIUM CHLORIDE 0.9 % IV SOLN
300.0000 mg | Freq: Once | INTRAVENOUS | Status: AC
  Administered 2018-07-21: 300 mg via INTRAVENOUS
  Filled 2018-07-21: qty 30

## 2018-07-21 MED ORDER — SODIUM CHLORIDE 0.9 % IV SOLN
Freq: Once | INTRAVENOUS | Status: AC
  Administered 2018-07-21: 11:00:00 via INTRAVENOUS
  Filled 2018-07-21: qty 5

## 2018-07-21 MED ORDER — DIPHENHYDRAMINE HCL 50 MG/ML IJ SOLN
INTRAMUSCULAR | Status: AC
  Filled 2018-07-21: qty 1

## 2018-07-21 MED ORDER — SODIUM CHLORIDE 0.9 % IV SOLN
80.0000 mg/m2 | Freq: Once | INTRAVENOUS | Status: AC
  Administered 2018-07-21: 174 mg via INTRAVENOUS
  Filled 2018-07-21: qty 29

## 2018-07-21 MED ORDER — DIPHENHYDRAMINE HCL 50 MG/ML IJ SOLN
25.0000 mg | Freq: Once | INTRAMUSCULAR | Status: AC
  Administered 2018-07-21: 25 mg via INTRAVENOUS

## 2018-07-21 MED ORDER — HEPARIN SOD (PORK) LOCK FLUSH 100 UNIT/ML IV SOLN
500.0000 [IU] | Freq: Once | INTRAVENOUS | Status: AC | PRN
  Administered 2018-07-21: 500 [IU]
  Filled 2018-07-21: qty 5

## 2018-07-21 MED ORDER — SODIUM CHLORIDE 0.9% FLUSH
10.0000 mL | INTRAVENOUS | Status: DC | PRN
  Administered 2018-07-21: 10 mL
  Filled 2018-07-21: qty 10

## 2018-07-21 MED ORDER — FAMOTIDINE IN NACL 20-0.9 MG/50ML-% IV SOLN
INTRAVENOUS | Status: AC
  Filled 2018-07-21: qty 50

## 2018-07-21 NOTE — Patient Instructions (Signed)
   [facility] Discharge Instructions for Patients Receiving Chemotherapy  Today you received the following chemotherapy agents Taxol and Carboplatin   To help prevent nausea and vomiting after your treatment, we encourage you to take your nausea medication as directed.    If you develop nausea and vomiting that is not controlled by your nausea medication, call the clinic.   BELOW ARE SYMPTOMS THAT SHOULD BE REPORTED IMMEDIATELY:  *FEVER GREATER THAN 100.5 F  *CHILLS WITH OR WITHOUT FEVER  NAUSEA AND VOMITING THAT IS NOT CONTROLLED WITH YOUR NAUSEA MEDICATION  *UNUSUAL SHORTNESS OF BREATH  *UNUSUAL BRUISING OR BLEEDING  TENDERNESS IN MOUTH AND THROAT WITH OR WITHOUT PRESENCE OF ULCERS  *URINARY PROBLEMS  *BOWEL PROBLEMS  UNUSUAL RASH Items with * indicate a potential emergency and should be followed up as soon as possible.  Feel free to call the clinic should you have any questions or concerns. The clinic phone number is [phone].  Please show the CHEMO ALERT CARD at check-in to the Emergency Department and triage nurse.   

## 2018-07-21 NOTE — Telephone Encounter (Signed)
Gave avs and calendar per MD lab and flush after f/u visit is ok

## 2018-07-21 NOTE — Telephone Encounter (Signed)
Per MD review of CBC with Diff with ANC of 1.0 - ok to proceed with treatment today.

## 2018-07-26 ENCOUNTER — Telehealth: Payer: Self-pay | Admitting: Oncology

## 2018-07-26 NOTE — Telephone Encounter (Signed)
Tried to reach regarding 2/27 I did leave a message

## 2018-07-28 ENCOUNTER — Inpatient Hospital Stay: Payer: BC Managed Care – PPO

## 2018-07-28 ENCOUNTER — Encounter: Payer: Self-pay | Admitting: Adult Health

## 2018-07-28 ENCOUNTER — Telehealth: Payer: Self-pay | Admitting: Adult Health

## 2018-07-28 ENCOUNTER — Inpatient Hospital Stay: Payer: BC Managed Care – PPO | Admitting: Adult Health

## 2018-07-28 VITALS — BP 102/80 | HR 113 | Temp 97.7°F | Resp 18 | Ht 69.0 in | Wt 209.5 lb

## 2018-07-28 DIAGNOSIS — C50412 Malignant neoplasm of upper-outer quadrant of left female breast: Secondary | ICD-10-CM

## 2018-07-28 DIAGNOSIS — C773 Secondary and unspecified malignant neoplasm of axilla and upper limb lymph nodes: Secondary | ICD-10-CM | POA: Diagnosis not present

## 2018-07-28 DIAGNOSIS — Z171 Estrogen receptor negative status [ER-]: Principal | ICD-10-CM

## 2018-07-28 DIAGNOSIS — Z95828 Presence of other vascular implants and grafts: Secondary | ICD-10-CM

## 2018-07-28 DIAGNOSIS — R918 Other nonspecific abnormal finding of lung field: Secondary | ICD-10-CM | POA: Diagnosis not present

## 2018-07-28 DIAGNOSIS — G43809 Other migraine, not intractable, without status migrainosus: Secondary | ICD-10-CM

## 2018-07-28 DIAGNOSIS — R590 Localized enlarged lymph nodes: Secondary | ICD-10-CM

## 2018-07-28 LAB — COMPREHENSIVE METABOLIC PANEL
ALT: 15 U/L (ref 0–44)
AST: 16 U/L (ref 15–41)
Albumin: 4 g/dL (ref 3.5–5.0)
Alkaline Phosphatase: 69 U/L (ref 38–126)
Anion gap: 11 (ref 5–15)
BUN: 10 mg/dL (ref 6–20)
CO2: 25 mmol/L (ref 22–32)
Calcium: 9.7 mg/dL (ref 8.9–10.3)
Chloride: 106 mmol/L (ref 98–111)
Creatinine, Ser: 0.68 mg/dL (ref 0.44–1.00)
GFR calc Af Amer: >60 mL/min (ref >60)
GFR calc non Af Amer: >60 mL/min (ref >60)
Glucose, Bld: 107 mg/dL — ABNORMAL HIGH (ref 70–99)
POTASSIUM: 3.9 mmol/L (ref 3.5–5.1)
Sodium: 142 mmol/L (ref 135–145)
TOTAL PROTEIN: 7.2 g/dL (ref 6.5–8.1)
Total Bilirubin: 0.7 mg/dL (ref 0.3–1.2)

## 2018-07-28 LAB — CBC WITH DIFFERENTIAL/PLATELET
Abs Immature Granulocytes: 0 10*3/uL (ref 0.00–0.07)
Basophils Absolute: 0 10*3/uL (ref 0.0–0.1)
Basophils Relative: 1 %
Eosinophils Absolute: 0 10*3/uL (ref 0.0–0.5)
Eosinophils Relative: 1 %
HCT: 34.8 % — ABNORMAL LOW (ref 36.0–46.0)
Hemoglobin: 11.7 g/dL — ABNORMAL LOW (ref 12.0–15.0)
Immature Granulocytes: 0 %
LYMPHS PCT: 34 %
Lymphs Abs: 0.8 10*3/uL (ref 0.7–4.0)
MCH: 34.7 pg — ABNORMAL HIGH (ref 26.0–34.0)
MCHC: 33.6 g/dL (ref 30.0–36.0)
MCV: 103.3 fL — ABNORMAL HIGH (ref 80.0–100.0)
Monocytes Absolute: 0.3 10*3/uL (ref 0.1–1.0)
Monocytes Relative: 14 %
Neutro Abs: 1.1 10*3/uL — ABNORMAL LOW (ref 1.7–7.7)
Neutrophils Relative %: 50 %
Platelets: 196 10*3/uL (ref 150–400)
RBC: 3.37 MIL/uL — AB (ref 3.87–5.11)
RDW: 15.9 % — ABNORMAL HIGH (ref 11.5–15.5)
WBC: 2.2 10*3/uL — AB (ref 4.0–10.5)
nRBC: 0 % (ref 0.0–0.2)

## 2018-07-28 MED ORDER — SODIUM CHLORIDE 0.9 % IV SOLN
80.0000 mg/m2 | Freq: Once | INTRAVENOUS | Status: AC
  Administered 2018-07-28: 174 mg via INTRAVENOUS
  Filled 2018-07-28: qty 29

## 2018-07-28 MED ORDER — SODIUM CHLORIDE 0.9 % IV SOLN
20.0000 mg | Freq: Once | INTRAVENOUS | Status: AC
  Administered 2018-07-28: 20 mg via INTRAVENOUS
  Filled 2018-07-28: qty 2

## 2018-07-28 MED ORDER — PALONOSETRON HCL INJECTION 0.25 MG/5ML
INTRAVENOUS | Status: AC
  Filled 2018-07-28: qty 5

## 2018-07-28 MED ORDER — DIPHENHYDRAMINE HCL 50 MG/ML IJ SOLN
25.0000 mg | Freq: Once | INTRAMUSCULAR | Status: AC
  Administered 2018-07-28: 25 mg via INTRAVENOUS

## 2018-07-28 MED ORDER — SODIUM CHLORIDE 0.9 % IV SOLN
300.0000 mg | Freq: Once | INTRAVENOUS | Status: AC
  Administered 2018-07-28: 300 mg via INTRAVENOUS
  Filled 2018-07-28: qty 30

## 2018-07-28 MED ORDER — SODIUM CHLORIDE 0.9% FLUSH
10.0000 mL | Freq: Once | INTRAVENOUS | Status: AC
  Administered 2018-07-28: 10 mL
  Filled 2018-07-28: qty 10

## 2018-07-28 MED ORDER — HEPARIN SOD (PORK) LOCK FLUSH 100 UNIT/ML IV SOLN
500.0000 [IU] | Freq: Once | INTRAVENOUS | Status: AC | PRN
  Administered 2018-07-28: 500 [IU]
  Filled 2018-07-28: qty 5

## 2018-07-28 MED ORDER — SODIUM CHLORIDE 0.9 % IV SOLN
Freq: Once | INTRAVENOUS | Status: DC
  Filled 2018-07-28: qty 250

## 2018-07-28 MED ORDER — SODIUM CHLORIDE 0.9 % IV SOLN
Freq: Once | INTRAVENOUS | Status: AC
  Administered 2018-07-28: 10:00:00 via INTRAVENOUS
  Filled 2018-07-28: qty 250

## 2018-07-28 MED ORDER — PALONOSETRON HCL INJECTION 0.25 MG/5ML
0.2500 mg | Freq: Once | INTRAVENOUS | Status: AC
  Administered 2018-07-28: 0.25 mg via INTRAVENOUS

## 2018-07-28 MED ORDER — DIPHENHYDRAMINE HCL 50 MG/ML IJ SOLN
INTRAMUSCULAR | Status: AC
  Filled 2018-07-28: qty 1

## 2018-07-28 MED ORDER — SODIUM CHLORIDE 0.9 % IV SOLN
Freq: Once | INTRAVENOUS | Status: AC
  Administered 2018-07-28: 10:00:00 via INTRAVENOUS
  Filled 2018-07-28: qty 5

## 2018-07-28 MED ORDER — SODIUM CHLORIDE 0.9% FLUSH
10.0000 mL | INTRAVENOUS | Status: DC | PRN
  Administered 2018-07-28: 10 mL
  Filled 2018-07-28: qty 10

## 2018-07-28 NOTE — Progress Notes (Signed)
[place]  Telephone:([phone] [phone]    ID: Paula Berry DOB: Oct 08, 1970  MR#: [id number]  KAJ#:[id number]  Patient Care Team: Paula Shanks, MD as PCP - General (Family Medicine) Paula Bookbinder, MD as Consulting Physician (General Surgery) Paula Berry, Paula Dad, MD as Consulting Physician (Oncology) Paula Gibson, MD as Attending Physician (Radiation Oncology) Paula Farrier, MD as Consulting Physician (Obstetrics and Gynecology) OTHER MD:   CHIEF COMPLAINT: Triple negative breast cancer  CURRENT TREATMENT: Neoadjuvant chemotherapy: Carboplatin and paclitaxel x12   HISTORY OF CURRENT ILLNESS: From the original intake note:  The patient had a left mammogram with tomography and ultrasonography 07/12/2015 at the [facility] after screening recall for a possible left breast mass.  This found the breast density to be category C.  There was a small lobulated mass in the lower outer aspect of the left breast, which was not palpable.  Ultrasound showed a small lobulated cyst in that area measuring 0.6 cm.  This was felt to be benign.  In July 2019 she had screening mammography at Dr. Clementeen Hoof office.  I do not have that report but according to the patient it was unremarkable.  In October 2019 however the patient developed left axillary swelling and tenderness and was again set up for left mammography with tomography and left breast ultrasonography at the [facility], 03/11/2018.  Breast density was category C.  Mammography showed numerous markedly enlarged left axillary lymph nodes.  There was periareolar skin thickening.  There was an area of ill-defined distortion in the upper outer left breast and on physical exam there was a firm lump superior and lateral to the left nipple, with swelling of the left axilla.  Ultrasound of the left breast and both upper quadrants found an ill-defined irregular hypoechoic area measuring at least 4.2 cm.   There also multiple scattered cysts.  Ultrasound of the left axilla showed at least 6 abnormal lymph nodes.  On 03/16/2018 the patient underwent right mammography with tomography and right breast ultrasonography at the [facility].  Again breast density was category C.  There were multiple circumscribed equal density masses in the upper outer quadrant of the right breast which appeared stable.  Ultrasound showed diffuse fibrocystic changes.  An additional hypoechoic mass was noted at the 9 o'clock position 3 cm from the nipple measuring 0.8 cm, with no associated vascularity.  A complex solid and cystic mass was noted superficially at the 6 o'clock position 3 cm from the nipple measuring 1.6 cm.  Evaluation of the right axilla was negative.  On 03/18/2018 the patient underwent biopsy of the 2 suspicious areas in the right breast, and they both showed only fibrocystic changes with some chronic inflammation ([id number]).  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  The patient's subsequent history is as detailed below.   INTERVAL HISTORY: Paula Berry returns today for follow-up and treatment of her triple negative breast cancer.   She continues on neoadjuvant chemotherapy consisting of weekly carboplatin and paclitaxel x12.   REVIEW OF SYSTEMS: Paula Berry doing well.  She is fatigued, and notes pain on her nail beds.  She has had some flashing before her eyes and went to see ophthalmology who diagnosed her with ocular migraines.  She has been dehydrated in the past and wonders  if she might be getting dehydrated again.  She struggles with drinking water.    Paula Berry denies any fevers, chills, chest pain, palpitations, cough, shortness of breath, bowel/bladder issues, vomiting, or  any other concerns.  A detailed ROS was otherwise non contributory.      PAST MEDICAL HISTORY: Past Medical History:  Diagnosis Date  . Anemia    had iron infusion 08/12/15  . Asthma   . Cancer [facility])    Breast cancer   . Genital herpes   . GERD (gastroesophageal reflux disease)   . Headache    migraines 2-3 times monthly  . Pneumonia    hospitalizied for pneumonia as a child    PAST SURGICAL HISTORY: Past Surgical History:  Procedure Laterality Date  . ABDOMINAL HYSTERECTOMY Bilateral 08/22/2015   Procedure: HYSTERECTOMY ABDOMINAL, BILATERAL SALPINGECTOMY;  Surgeon: Paula Farrier, MD;  Location: [facility];  Service: Gynecology;  Laterality: Bilateral;  . MYOMECTOMY ABDOMINAL APPROACH    . PORTACATH PLACEMENT N/A 03/30/2018   Procedure: INSERTION PORT-A-CATH WITH [place];  Surgeon: Paula Bookbinder, MD;  Location: [facility];  Service: General;  Laterality: N/A;    FAMILY HISTORY: Family History  Problem Relation Age of Onset  . Hypertension Unknown   . Diabetes Cousin        mat first cousin  . Cancer Unknown   . Dementia Maternal Grandfather   . Lung cancer Paternal Grandmother        d. 42-60  . Brain cancer Paternal Aunt        d. 43, father's mat 1/2 sister  . Diabetes Maternal Grandmother   . Other Paternal Grandfather        d. WWII  . Aneurysm Maternal Uncle        brain  . Migraines Neg Hx    The patient's parents are in their early 57s as of October 2019.  A paternal aunt had brain cancer at age 55.  A paternal grandmother had lung cancer at age 39.  There is no breast or ovarian cancer history in the family.  However note that the patient has little information on her mother side of the family.   GYNECOLOGIC HISTORY:  No LMP recorded. Patient has had a hysterectomy. 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  Her husband Paula Berry. works for the [employer].  He is a former patient here, with stage IV colon cancer.  They live alone, with no pets.     ADVANCED DIRECTIVES: Not in place   HEALTH MAINTENANCE: Social History   Tobacco Use  . Smoking status: Never Smoker  . Smokeless tobacco: Never Used  Substance Use Topics  . Alcohol use: Yes    Alcohol/week: 1.0 standard drinks    Types: 1 Glasses of wine per week    Comment: daily  . Drug use: No     Colonoscopy: Never  PAP: Status post hysterectomy  Bone density: Never   Allergies  Allergen Reactions  . Neosporin [Neomycin-Bacitracin Zn-Polymyx] Other (See Comments)    Decreased wound healing with blisters    Current Outpatient Medications  Medication Sig Dispense Refill  . acetaminophen (TYLENOL) 500 MG tablet Take 1,000 mg by mouth every 8 (eight) hours as needed for  moderate pain.    Marland Kitchen acyclovir ointment (ZOVIRAX) 5 % Apply 1 application topically 4 (four) times daily as needed (outbreaks).   2  . albuterol (PROVENTIL HFA;VENTOLIN HFA) 108 (90 Base) MCG/ACT inhaler Inhale 2 puffs into the lungs every 6 (six) hours as needed for wheezing or shortness of breath.     Marland Kitchen azithromycin (ZITHROMAX) 250 MG tablet 2 tab on day 1 then 1 tab daily until complete 6 each 0  . chlorpheniramine-HYDROcodone (TUSSIONEX) 10-8 MG/5ML SUER Take 5 mLs by mouth every 12 (twelve) hours as needed for cough. 140 mL 0  . ciprofloxacin (CIPRO) 500 MG tablet Take 1 tablet (500 mg total) by mouth 2 (two) times daily. 14 tablet 0  . dexamethasone (DECADRON) 4 MG tablet Take 2 tablets by mouth once a day on the day after chemotherapy and then take 2 tablets two times a day for 2 days. Take with food. 30 tablet 1  . lidocaine-prilocaine (EMLA) cream Apply to affected area once 30 g 3  . LORazepam (ATIVAN) 0.5 MG tablet Take 1 tablet  (0.5 mg total) by mouth at bedtime as needed (Nausea or vomiting). 30 tablet 0  . methocarbamol (ROBAXIN) 500 MG tablet Take 500 mg by mouth 3 (three) times daily as needed for muscle spasms (pain).    . montelukast (SINGULAIR) 10 MG tablet Take 10 mg by mouth every evening.    Marland Kitchen oxyCODONE (OXY IR/ROXICODONE) 5 MG immediate release tablet Take 1 tablet (5 mg total) by mouth every 6 (six) hours as needed for moderate pain, severe pain or breakthrough pain. 8 tablet 0  . pantoprazole (PROTONIX) 40 MG tablet Take 40 mg by mouth every evening.    . prochlorperazine (COMPAZINE) 10 MG tablet Take 1 tablet (10 mg total) by mouth every 6 (six) hours as needed (Nausea or vomiting). 30 tablet 1  . SYMBICORT 160-4.5 MCG/ACT inhaler INL 2 PFS PO BID  3  . valACYclovir (VALTREX) 500 MG tablet Take 500 mg by mouth See admin instructions. Take 500 mg twice daily for 3 days at onset of outbreak then take 500 mg once daily until clear     No current facility-administered medications for this visit.    Facility-Administered Medications Ordered in Other Visits  Medication Dose Route Frequency Provider Last Rate Last Dose  . 0.9 %  sodium chloride infusion   Intravenous Once Paula Berry, Paula Dad, MD      . sodium chloride flush (NS) 0.9 % injection 10 mL  10 mL Intracatheter PRN Paula Berry, Paula Dad, MD   10 mL at 07/28/18 1348    OBJECTIVE:  Vitals:   07/28/18 0817  BP: 102/80  Pulse: (!) 113  Resp: 18  Temp: 97.7 F (36.5 C)  SpO2: 100%     Body mass index is 30.94 kg/m.   Wt Readings from Last 3 Encounters:  07/28/18 209 lb 8 oz (95 kg)  07/21/18 206 lb 8 oz (93.7 kg)  07/07/18 207 lb 12.8 oz (94.3 kg)  ECOG FS: 1 - Symptomatic but completely ambulatory GENERAL: Patient is a well appearing female in no acute distress HEENT:  Sclerae anicteric.  Oropharynx clear and moist. No ulcerations or evidence of oropharyngeal candidiasis. Neck is supple.  NODES:  No cervical, supraclavicular, or axillary  lymphadenopathy palpated.  BREAST EXAM:  Deferred. LUNGS:  Clear to auscultation bilaterally.  No wheezes or rhonchi. HEART:  Regular rate and rhythm. No murmur appreciated. ABDOMEN:  Soft, nontender.  Positive, normoactive bowel sounds. No organomegaly  palpated. MSK:  No focal spinal tenderness to palpation. Full range of motion bilaterally in the upper extremities. EXTREMITIES:  No peripheral edema.   SKIN:  Clear with no obvious rashes or skin changes. No nail dyscrasia. NEURO:  Nonfocal. Well oriented.  Appropriate affect.       LAB RESULTS:  CMP     Component Value Date/Time   NA 142 07/28/2018 0833   K 3.9 07/28/2018 0833   CL 106 07/28/2018 0833   CO2 25 07/28/2018 0833   GLUCOSE 107 (H) 07/28/2018 0833   BUN 10 07/28/2018 0833   CREATININE 0.68 07/28/2018 0833   CREATININE 0.67 03/23/2018 0855   CALCIUM 9.7 07/28/2018 0833   PROT 7.2 07/28/2018 0833   ALBUMIN 4.0 07/28/2018 0833   AST 16 07/28/2018 0833   AST 15 03/23/2018 0855   ALT 15 07/28/2018 0833   ALT 17 03/23/2018 0855   ALKPHOS 69 07/28/2018 0833   BILITOT 0.7 07/28/2018 0833   BILITOT 1.0 03/23/2018 0855   GFRNONAA >60 07/28/2018 0833   GFRNONAA >60 03/23/2018 0855   GFRAA >60 07/28/2018 0833   GFRAA >60 03/23/2018 0855    No results found for: Ronnald Ramp, A1GS, A2GS, BETS, BETA2SER, GAMS, MSPIKE, SPEI  No results found for: Nils Pyle, [facility]  Lab Results  Component Value Date   WBC 2.2 (L) 07/28/2018   NEUTROABS 1.1 (L) 07/28/2018   HGB 11.7 (L) 07/28/2018   HCT 34.8 (L) 07/28/2018   MCV 103.3 (H) 07/28/2018   PLT 196 07/28/2018      Chemistry      Component Value Date/Time   NA 142 07/28/2018 0833   K 3.9 07/28/2018 0833   CL 106 07/28/2018 0833   CO2 25 07/28/2018 0833   BUN 10 07/28/2018 0833   CREATININE 0.68 07/28/2018 0833   CREATININE 0.67 03/23/2018 0855      Component Value Date/Time   CALCIUM 9.7 07/28/2018 0833   ALKPHOS 69  07/28/2018 0833   AST 16 07/28/2018 0833   AST 15 03/23/2018 0855   ALT 15 07/28/2018 0833   ALT 17 03/23/2018 0855   BILITOT 0.7 07/28/2018 0833   BILITOT 1.0 03/23/2018 0855       No results found for: LABCA2  No components found for: [id number]  No results for input(s): INR in the last 168 hours.  No results found for: LABCA2  No results found for: CAN199  No results found for: XHB716  No results found for: RCV893  Lab Results  Component Value Date   CA2729 213.7 (H) 04/07/2018    No components found for: HGQUANT  No results found for: CEA1 / No results found for: CEA1   No results found for: AFPTUMOR  No results found for: [place]  No results found for: PSA1  Appointment on 07/28/2018  Component Date Value Ref Range Status  . Sodium 07/28/2018 142  135 - 145 mmol/L Final  . Potassium 07/28/2018 3.9  3.5 - 5.1 mmol/L Final  . Chloride 07/28/2018 106  98 - 111 mmol/L Final  . CO2 07/28/2018 25  22 - 32 mmol/L Final  . Glucose, Bld 07/28/2018 107* 70 - 99 mg/dL Final  . BUN 07/28/2018 10  6 - 20 mg/dL Final  . Creatinine, Ser 07/28/2018 0.68  0.44 - 1.00 mg/dL Final  . Calcium 07/28/2018 9.7  8.9 - 10.3 mg/dL Final  . Total Protein 07/28/2018 7.2  6.5 - 8.1 g/dL Final  . Albumin 07/28/2018 4.0  3.5 - 5.0  g/dL Final  . AST 07/28/2018 16  15 - 41 U/L Final  . ALT 07/28/2018 15  0 - 44 U/L Final  . Alkaline Phosphatase 07/28/2018 69  38 - 126 U/L Final  . Total Bilirubin 07/28/2018 0.7  0.3 - 1.2 mg/dL Final  . GFR calc non Af Amer 07/28/2018 >60  >60 mL/min Final  . GFR calc Af Amer 07/28/2018 >60  >60 mL/min Final  . Anion gap 07/28/2018 11  5 - 15 Final   Performed at [facility], [place] [address], [place], [place] [address]  . WBC 07/28/2018 2.2* 4.0 - 10.5 K/uL Final  . RBC 07/28/2018 3.37* 3.87 - 5.11 MIL/uL Final  . Hemoglobin 07/28/2018 11.7* 12.0 - 15.0 g/dL Final  . HCT 07/28/2018 34.8* 36.0 - 46.0 % Final  . MCV  07/28/2018 103.3* 80.0 - 100.0 fL Final  . MCH 07/28/2018 34.7* 26.0 - 34.0 pg Final  . MCHC 07/28/2018 33.6  30.0 - 36.0 g/dL Final  . RDW 07/28/2018 15.9* 11.5 - 15.5 % Final  . Platelets 07/28/2018 196  150 - 400 K/uL Final  . nRBC 07/28/2018 0.0  0.0 - 0.2 % Final  . Neutrophils Relative % 07/28/2018 50  % Final  . Neutro Abs 07/28/2018 1.1* 1.7 - 7.7 K/uL Final  . Lymphocytes Relative 07/28/2018 34  % Final  . Lymphs Abs 07/28/2018 0.8  0.7 - 4.0 K/uL Final  . Monocytes Relative 07/28/2018 14  % Final  . Monocytes Absolute 07/28/2018 0.3  0.1 - 1.0 K/uL Final  . Eosinophils Relative 07/28/2018 1  % Final  . Eosinophils Absolute 07/28/2018 0.0  0.0 - 0.5 K/uL Final  . Basophils Relative 07/28/2018 1  % Final  . Basophils Absolute 07/28/2018 0.0  0.0 - 0.1 K/uL Final  . Immature Granulocytes 07/28/2018 0  % Final  . Abs Immature Granulocytes 07/28/2018 0.00  0.00 - 0.07 K/uL Final  . Tear Drop Cells 07/28/2018 PRESENT   Final  . Ovalocytes 07/28/2018 PRESENT   Final   Performed at [facility], [place] [address] [place], [place] [address]    (this displays the last labs from the last 3 days)  No results found for: TOTALPROTELP, ALBUMINELP, A1GS, A2GS, BETS, BETA2SER, GAMS, MSPIKE, SPEI (this displays SPEP labs)  No results found for: KPAFRELGTCHN, LAMBDASER, KAPLAMBRATIO (kappa/lambda light chains)  No results found for: HGBA, HGBA2QUANT, HGBFQUANT, HGBSQUAN (Hemoglobinopathy evaluation)   No results found for: LDH  No results found for: IRON, TIBC, IRONPCTSAT (Iron and TIBC)  No results found for: FERRITIN  Urinalysis No results found for: COLORURINE, APPEARANCEUR, LABSPEC, PHURINE, GLUCOSEU, HGBUR, BILIRUBINUR, KETONESUR, PROTEINUR, UROBILINOGEN, NITRITE, LEUKOCYTESUR   STUDIES: Dg Chest 2 View  Result Date: 07/07/2018 CLINICAL DATA:  Cough and shortness of breath. The patient is being treated with chemotherapy for a left breast cancer. EXAM:  CHEST - 2 VIEW COMPARISON:  None. FINDINGS: Right-sided injectable port in stable position. Cardiomediastinal silhouette is normal. Mediastinal contours appear intact. There is no evidence of acute focal airspace consolidation, pleural effusion or pneumothorax. Left lower lobe varicoid bronchiectasis with peribronchial thickening and subpleural scarring is stable from the prior CT. Osseous structures are without acute abnormality. Soft tissues are grossly normal. IMPRESSION: No active cardiopulmonary disease. Chronic varicoid left lower lobe bronchiectasis and peribronchial thickening/scarring. Electronically Signed   By: Fidela Salisbury M.D.   On: 07/07/2018 12:15    ELIGIBLE FOR AVAILABLE RESEARCH PROTOCOL:  Possibly [id number]   ASSESSMENT: 48 y.o.  [place] woman status post left breast overlapping site biopsy and left axillary lymph node biopsy 03/16/2018 for a clinical T2 N3, stage IIIC invasive ductal carcinoma, triple negative, with an MIB-1 of 15%   (1) Staging studies  (a) CT chest on 04/05/2018: left axillary and chronic left retropectoral adenopathy, supraclavicular adenopathy, left breast skin thickening and mass, 55m left upper lobe pulmonary nodule (repeat ct chest in 3-6 months recommended), bronchiectasis in left lower lobe.  (b) CT neck on 04/05/2018: left supraclavicular adenopathy, ? Abnormality in left frontal lobe of brain  (c) Bone scan on 04/05/2018: no findings suggestive of metastatic disease to bone  (d) MRI brain 04/07/2018: negative  (2) neoadjuvant chemotherapy consisting of doxorubicin and cyclophosphamide in dose dense fashion x4 started 04/07/2018, completed 05/19/2018,  followed by weekly carboplatin and paclitaxel x12 started 06/02/2018  (3) genetics testing 11/18: no deleterious mutations.  Genes tested include: AIP, ALK, APC, ATM, AXIN2,BAP1,  BARD1, BLM, BMPR1A, BRCA1, BRCA2, BRIP1, CASR, CDC73, CDH1, CDK4, CDKN1B, CDKN1C, CDKN2A (p14ARF), CDKN2A (p16INK4a), CEBPA,  CHEK2, CTNNA1, DICER1, DIS3L2, EGFR (c.2369C>T, p.Thr790Met variant only), EPCAM (Deletion/duplication testing only), FH, FLCN, GATA2, GPC3, GREM1 (Promoter region deletion/duplication testing only), HOXB13 (c.251G>A, p.Gly84Glu), HRAS, KIT, MAX, MEN1, MET, MITF (c.952G>A, p.Glu318Lys variant only), MLH1, MSH2, MSH3, MSH6, MUTYH, NBN, NF1, NF2, NTHL1, PALB2, PDGFRA, PHOX2B, PMS2, POLD1, POLE, POT1, PRKAR1A, PTCH1, PTEN, RAD50, RAD51C, RAD51D, RB1, RECQL4, RET, RUNX1, SDHAF2, SDHA (sequence changes only), SDHB, SDHC, SDHD, SMAD4, SMARCA4, SMARCB1, SMARCE1, STK11, SUFU, TERC, TERT, TMEM127, TP53, TSC1, TSC2, VHL, WRN and WT1.  (4) definitive surgery to follow  (5) adjuvant radiation   PLAN: MAmereis doing well today.  She has not yet developed peripheral neuropathy.  She will proceed with chemotherapy today.    We reviewed her ocular migraines.  I reviewed that we could certainly give her some IV fluids today to see if that would help prevent these from happening.  I encouraged different ways to flavor her water so that it may possibly become more palatable.  Her ANC was 1.1.  Last week it was 1 and she did not receive any growth factor injections.  She will proceed today with this [place] and we will monitor her labs.    MJaimeewill return in one week for labs and f/u.  She knows to keep uKoreaposted with any other issues that may develop before then.  A total of (20) minutes of face-to-face time was spent with this patient with greater than 50% of that time in counseling and care-coordination.   LWilber Bihari NP 07/28/18 2:03 PM Medical Oncology and Hematology [facility]52 [address]AOrr  [id number]. [phone]   Fax. [phone]

## 2018-07-28 NOTE — Patient Instructions (Signed)
   [facility] Discharge Instructions for Patients Receiving Chemotherapy  Today you received the following chemotherapy agents Taxol and Carboplatin   To help prevent nausea and vomiting after your treatment, we encourage you to take your nausea medication as directed.    If you develop nausea and vomiting that is not controlled by your nausea medication, call the clinic.   BELOW ARE SYMPTOMS THAT SHOULD BE REPORTED IMMEDIATELY:  *FEVER GREATER THAN 100.5 F  *CHILLS WITH OR WITHOUT FEVER  NAUSEA AND VOMITING THAT IS NOT CONTROLLED WITH YOUR NAUSEA MEDICATION  *UNUSUAL SHORTNESS OF BREATH  *UNUSUAL BRUISING OR BLEEDING  TENDERNESS IN MOUTH AND THROAT WITH OR WITHOUT PRESENCE OF ULCERS  *URINARY PROBLEMS  *BOWEL PROBLEMS  UNUSUAL RASH Items with * indicate a potential emergency and should be followed up as soon as possible.  Feel free to call the clinic should you have any questions or concerns. The clinic phone number is [phone].  Please show the CHEMO ALERT CARD at check-in to the Emergency Department and triage nurse.   

## 2018-07-28 NOTE — Telephone Encounter (Signed)
No los °

## 2018-07-28 NOTE — Progress Notes (Signed)
Per Wilber Bihari, NP okay to treat with Anc of 1.1

## 2018-07-28 NOTE — Patient Instructions (Signed)

## 2018-07-31 HISTORY — PX: MASTECTOMY: SHX3

## 2018-08-03 NOTE — Progress Notes (Signed)
[place]  [phone] [phone]    ID: Paula Berry DOB: 02-22-1971  MR#: [id number]  HYQ#:[id number]  Patient Care Team: Paula Shanks, MD as PCP - General (Family Medicine) Paula Bookbinder, MD as Consulting Physician (General Surgery) Paula Berry, Paula Dad, MD as Consulting Physician (Oncology) Paula Gibson, MD as Attending Physician (Radiation Oncology) Paula Farrier, MD as Consulting Physician (Obstetrics and Gynecology) OTHER MD:   CHIEF COMPLAINT: Triple negative breast cancer  CURRENT TREATMENT: Neoadjuvant chemotherapy: Carboplatin and paclitaxel x12   HISTORY OF CURRENT ILLNESS: From the original intake note:  The patient had a left mammogram with tomography and ultrasonography 07/12/2015 at the [facility] after screening recall for a possible left breast mass.  This found the breast density to be category C.  There was a small lobulated mass in the lower outer aspect of the left breast, which was not palpable.  Ultrasound showed a small lobulated cyst in that area measuring 0.6 cm.  This was felt to be benign.  In July 2019 she had screening mammography at Dr. Clementeen Hoof office.  I do not have that report but according to the patient it was unremarkable.  In October 2019 however the patient developed left axillary swelling and tenderness and was again set up for left mammography with tomography and left breast ultrasonography at the [facility], 03/11/2018.  Breast density was category C.  Mammography showed numerous markedly enlarged left axillary lymph nodes.  There was periareolar skin thickening.  There was an area of ill-defined distortion in the upper outer left breast and on physical exam there was a firm lump superior and lateral to the left nipple, with swelling of the left axilla.  Ultrasound of the left breast and both upper quadrants found an ill-defined irregular hypoechoic area measuring at least 4.2 cm.   There also multiple scattered cysts.  Ultrasound of the left axilla showed at least 6 abnormal lymph nodes.  On 03/16/2018 the patient underwent right mammography with tomography and right breast ultrasonography at the [facility].  Again breast density was category C.  There were multiple circumscribed equal density masses in the upper outer quadrant of the right breast which appeared stable.  Ultrasound showed diffuse fibrocystic changes.  An additional hypoechoic mass was noted at the 9 o'clock position 3 cm from the nipple measuring 0.8 cm, with no associated vascularity.  A complex solid and cystic mass was noted superficially at the 6 o'clock position 3 cm from the nipple measuring 1.6 cm.  Evaluation of the right axilla was negative.   On 03/18/2018 the patient underwent biopsy of the 2 suspicious areas in the right breast, and they both showed only fibrocystic changes with some chronic inflammation ([id number]).  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  The patient's subsequent history is as detailed below.   INTERVAL HISTORY: Paula Berry returns today for follow-up and treatment of her triple negative breast cancer. She is accompanied by her mother.  She continues on weekly carboplatin and paclitaxel x12. Today is day 1 cycle 10. She says it went very well and that it seemed to be no different than the rest. She still has some soreness in her fingertips, but no neuropathy; she developed this about 3 weeks ago. She states that it is difficult  to do specific fine moter skills like opening packages, knotting her laces, and opening jars. She feels some neuropathy on the first night after chemotherapy.   Since her last visit here, she has not undergone any additional studies.     REVIEW OF SYSTEMS: Zondra continues to walk and participate in [place] for exercise. The patient denies unusual headaches, visual changes, nausea, vomiting, or dizziness. There has been no unusual cough, phlegm production, or pleurisy. This been no change in bowel or bladder habits. The patient denies unexplained fatigue or unexplained weight loss, bleeding, rash, or fever. A detailed review of systems was otherwise noncontributory.    PAST MEDICAL HISTORY: Past Medical History:  Diagnosis Date  . Anemia    had iron infusion 08/12/15  . Asthma   . Cancer [facility])    Breast cancer   . Genital herpes   . GERD (gastroesophageal reflux disease)   . Headache    migraines 2-3 times monthly  . Pneumonia    hospitalizied for pneumonia as a child    PAST SURGICAL HISTORY: Past Surgical History:  Procedure Laterality Date  . ABDOMINAL HYSTERECTOMY Bilateral 08/22/2015   Procedure: HYSTERECTOMY ABDOMINAL, BILATERAL SALPINGECTOMY;  Surgeon: Paula Farrier, MD;  Location: [facility];  Service: Gynecology;  Laterality: Bilateral;  . MYOMECTOMY ABDOMINAL APPROACH    . PORTACATH PLACEMENT N/A 03/30/2018   Procedure: INSERTION PORT-A-CATH WITH [place];  Surgeon: Paula Bookbinder, MD;  Location: [facility];  Service: General;  Laterality: N/A;    FAMILY HISTORY: Family History  Problem Relation Age of Onset  . Hypertension Unknown   . Diabetes Cousin        mat first cousin  . Cancer Unknown   . Dementia Maternal Grandfather   . Lung cancer Paternal Grandmother        d. 59-60  . Brain cancer Paternal Aunt        d. 51, father's mat 1/2 sister  . Diabetes Maternal Grandmother   . Other Paternal Grandfather        d. WWII  . Aneurysm Maternal Uncle        brain  . Migraines Neg Hx    The patient's parents are in their early 73s as of October 2019.  A paternal aunt had brain cancer at age 56.  A paternal grandmother had lung cancer at age 46.  There is no breast or ovarian cancer history in the  family.  However note that the patient has little information on her mother side of the family.   GYNECOLOGIC HISTORY:  No LMP recorded. Patient has had a hysterectomy. 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  Her husband [name]. works for the [employer].  He is a former patient here, with stage IV colon cancer.  They live alone, with no pets.     ADVANCED DIRECTIVES: Not in place   HEALTH MAINTENANCE: Social History   Tobacco Use  . Smoking status: Never Smoker  . Smokeless tobacco: Never Used  Substance Use Topics  . Alcohol use: Yes    Alcohol/week: 1.0 standard drinks    Types: 1 Glasses of wine per week    Comment: daily  . Drug use: No     Colonoscopy: Never  PAP: Status post hysterectomy  Bone density: Never   Allergies  Allergen Reactions  . Neosporin [Neomycin-Bacitracin Zn-Polymyx] Other (See Comments)    Decreased wound healing with blisters    Current Outpatient Medications  Medication Sig Dispense Refill  . acetaminophen (TYLENOL) 500 MG tablet Take 1,000 mg by mouth every 8 (eight) hours as needed for moderate pain.    Marland Kitchen acyclovir ointment (ZOVIRAX) 5 % Apply 1 application topically 4 (four) times daily as needed (outbreaks).   2  . albuterol (PROVENTIL HFA;VENTOLIN HFA) 108 (90 Base) MCG/ACT inhaler Inhale 2 puffs into the lungs every 6 (six) hours as needed for wheezing or shortness of breath.     Marland Kitchen azithromycin (ZITHROMAX) 250 MG tablet 2 tab on day 1 then 1 tab daily until complete 6 each 0  . chlorpheniramine-HYDROcodone (TUSSIONEX) 10-8 MG/5ML SUER Take 5 mLs by mouth every 12 (twelve) hours as needed for cough. 140 mL 0  . ciprofloxacin (CIPRO) 500 MG tablet Take 1 tablet  (500 mg total) by mouth 2 (two) times daily. 14 tablet 0  . dexamethasone (DECADRON) 4 MG tablet Take 2 tablets by mouth once a day on the day after chemotherapy and then take 2 tablets two times a day for 2 days. Take with food. 30 tablet 1  . lidocaine-prilocaine (EMLA) cream Apply to affected area once 30 g 3  . LORazepam (ATIVAN) 0.5 MG tablet Take 1 tablet (0.5 mg total) by mouth at bedtime as needed (Nausea or vomiting). 30 tablet 0  . methocarbamol (ROBAXIN) 500 MG tablet Take 500 mg by mouth 3 (three) times daily as needed for muscle spasms (pain).    . montelukast (SINGULAIR) 10 MG tablet Take 10 mg by mouth every evening.    Marland Kitchen oxyCODONE (OXY IR/ROXICODONE) 5 MG immediate release tablet Take 1 tablet (5 mg total) by mouth every 6 (six) hours as needed for moderate pain, severe pain or breakthrough pain. 8 tablet 0  . pantoprazole (PROTONIX) 40 MG tablet Take 40 mg by mouth every evening.    . prochlorperazine (COMPAZINE) 10 MG tablet Take 1 tablet (10 mg total) by mouth every 6 (six) hours as needed (Nausea or vomiting). 30 tablet 1  . SYMBICORT 160-4.5 MCG/ACT inhaler INL 2 PFS PO BID  3  . valACYclovir (VALTREX) 500 MG tablet Take 500 mg by mouth See admin instructions. Take 500 mg twice daily for 3 days at onset of outbreak then take 500 mg once daily until clear     No current facility-administered medications for this visit.     OBJECTIVE: Young African-American woman in no acute distress  Vitals:   08/04/18 0815  BP: 133/70  Pulse: (!) 109  Resp: 20  Temp: 98.5 F (36.9 C)  SpO2: 100%     Body mass index is 30.66 kg/m.   Wt Readings from Last 3 Encounters:  08/04/18 207 lb 9.6 oz (94.2 kg)  07/28/18 209 lb 8 oz (95 kg)  07/21/18 206 lb 8 oz (93.7 kg)  ECOG FS: 1 - Symptomatic but completely ambulatory  Sclerae unicteric, EOMs intact Oropharynx clear and moist No cervical or supraclavicular adenopathy Lungs no rales or rhonchi Heart regular rate and rhythm Abd  soft, nontender, positive bowel sounds MSK no focal spinal tenderness, no upper extremity lymphedema Neuro: nonfocal, well oriented, appropriate affect Breasts: Both breasts are lumpy and I would not be able to tell which side had the cancer by palpation alone.  There are no skin or nipple changes  of concern.  Both axillae are benign.   LAB RESULTS:  CMP     Component Value Date/Time   NA 142 07/28/2018 0833   K 3.9 07/28/2018 0833   CL 106 07/28/2018 0833   CO2 25 07/28/2018 0833   GLUCOSE 107 (H) 07/28/2018 0833   BUN 10 07/28/2018 0833   CREATININE 0.68 07/28/2018 0833   CREATININE 0.67 03/23/2018 0855   CALCIUM 9.7 07/28/2018 0833   PROT 7.2 07/28/2018 0833   ALBUMIN 4.0 07/28/2018 0833   AST 16 07/28/2018 0833   AST 15 03/23/2018 0855   ALT 15 07/28/2018 0833   ALT 17 03/23/2018 0855   ALKPHOS 69 07/28/2018 0833   BILITOT 0.7 07/28/2018 0833   BILITOT 1.0 03/23/2018 0855   GFRNONAA >60 07/28/2018 0833   GFRNONAA >60 03/23/2018 0855   GFRAA >60 07/28/2018 0833   GFRAA >60 03/23/2018 0855    No results found for: Ronnald Ramp, A1GS, A2GS, BETS, BETA2SER, GAMS, MSPIKE, SPEI  No results found for: Nils Pyle, [facility]  Lab Results  Component Value Date   WBC 2.2 (L) 07/28/2018   NEUTROABS 1.1 (L) 07/28/2018   HGB 11.7 (L) 07/28/2018   HCT 34.8 (L) 07/28/2018   MCV 103.3 (H) 07/28/2018   PLT 196 07/28/2018      Chemistry      Component Value Date/Time   NA 142 07/28/2018 0833   K 3.9 07/28/2018 0833   CL 106 07/28/2018 0833   CO2 25 07/28/2018 0833   BUN 10 07/28/2018 0833   CREATININE 0.68 07/28/2018 0833   CREATININE 0.67 03/23/2018 0855      Component Value Date/Time   CALCIUM 9.7 07/28/2018 0833   ALKPHOS 69 07/28/2018 0833   AST 16 07/28/2018 0833   AST 15 03/23/2018 0855   ALT 15 07/28/2018 0833   ALT 17 03/23/2018 0855   BILITOT 0.7 07/28/2018 0833   BILITOT 1.0 03/23/2018 0855       No results found for:  LABCA2  No components found for: JGOTLX726  No results for input(s): INR in the last 168 hours.  No results found for: LABCA2  No results found for: OMB559  No results found for: RCB638  No results found for: GTX646  Lab Results  Component Value Date   CA2729 213.7 (H) 04/07/2018    No components found for: HGQUANT  No results found for: CEA1 / No results found for: CEA1   No results found for: AFPTUMOR  No results found for: CHROMOGRNA  No results found for: PSA1  No visits with results within 3 Day(s) from this visit.  Latest known visit with results is:  Appointment on 07/28/2018  Component Date Value Ref Range Status  . Sodium 07/28/2018 142  135 - 145 mmol/L Final  . Potassium 07/28/2018 3.9  3.5 - 5.1 mmol/L Final  . Chloride 07/28/2018 106  98 - 111 mmol/L Final  . CO2 07/28/2018 25  22 - 32 mmol/L Final  . Glucose, Bld 07/28/2018 107* 70 - 99 mg/dL Final  . BUN 07/28/2018 10  6 - 20 mg/dL Final  . Creatinine, Ser 07/28/2018 0.68  0.44 - 1.00 mg/dL Final  . Calcium 07/28/2018 9.7  8.9 - 10.3 mg/dL Final  . Total Protein 07/28/2018 7.2  6.5 - 8.1 g/dL Final  . Albumin 07/28/2018 4.0  3.5 - 5.0 g/dL Final  . AST 07/28/2018 16  15 - 41 U/L Final  . ALT 07/28/2018 15  0 - 44 U/L Final  .  Alkaline Phosphatase 07/28/2018 69  38 - 126 U/L Final  . Total Bilirubin 07/28/2018 0.7  0.3 - 1.2 mg/dL Final  . GFR calc non Af Amer 07/28/2018 >60  >60 mL/min Final  . GFR calc Af Amer 07/28/2018 >60  >60 mL/min Final  . Anion gap 07/28/2018 11  5 - 15 Final   Performed at [facility], [place] [address] [place], [place] [address]  . WBC 07/28/2018 2.2* 4.0 - 10.5 K/uL Final  . RBC 07/28/2018 3.37* 3.87 - 5.11 MIL/uL Final  . Hemoglobin 07/28/2018 11.7* 12.0 - 15.0 g/dL Final  . HCT 07/28/2018 34.8* 36.0 - 46.0 % Final  . MCV 07/28/2018 103.3* 80.0 - 100.0 fL Final  . MCH 07/28/2018 34.7* 26.0 - 34.0 pg Final  . MCHC 07/28/2018 33.6  30.0 - 36.0  g/dL Final  . RDW 07/28/2018 15.9* 11.5 - 15.5 % Final  . Platelets 07/28/2018 196  150 - 400 K/uL Final  . nRBC 07/28/2018 0.0  0.0 - 0.2 % Final  . Neutrophils Relative % 07/28/2018 50  % Final  . Neutro Abs 07/28/2018 1.1* 1.7 - 7.7 K/uL Final  . Lymphocytes Relative 07/28/2018 34  % Final  . Lymphs Abs 07/28/2018 0.8  0.7 - 4.0 K/uL Final  . Monocytes Relative 07/28/2018 14  % Final  . Monocytes Absolute 07/28/2018 0.3  0.1 - 1.0 K/uL Final  . Eosinophils Relative 07/28/2018 1  % Final  . Eosinophils Absolute 07/28/2018 0.0  0.0 - 0.5 K/uL Final  . Basophils Relative 07/28/2018 1  % Final  . Basophils Absolute 07/28/2018 0.0  0.0 - 0.1 K/uL Final  . Immature Granulocytes 07/28/2018 0  % Final  . Abs Immature Granulocytes 07/28/2018 0.00  0.00 - 0.07 K/uL Final  . Tear Drop Cells 07/28/2018 PRESENT   Final  . Ovalocytes 07/28/2018 PRESENT   Final   Performed at [facility], [place] [address] [place], [place] [address]    (this displays the last labs from the last 3 days)  No results found for: TOTALPROTELP, ALBUMINELP, A1GS, A2GS, BETS, BETA2SER, GAMS, MSPIKE, SPEI (this displays SPEP labs)  No results found for: KPAFRELGTCHN, LAMBDASER, KAPLAMBRATIO (kappa/lambda light chains)  No results found for: HGBA, HGBA2QUANT, HGBFQUANT, HGBSQUAN (Hemoglobinopathy evaluation)   No results found for: LDH  No results found for: IRON, TIBC, IRONPCTSAT (Iron and TIBC)  No results found for: FERRITIN  Urinalysis No results found for: COLORURINE, APPEARANCEUR, LABSPEC, PHURINE, GLUCOSEU, HGBUR, BILIRUBINUR, KETONESUR, PROTEINUR, UROBILINOGEN, NITRITE, LEUKOCYTESUR   STUDIES: Dg Chest 2 View  Result Date: 07/07/2018 CLINICAL DATA:  Cough and shortness of breath. The patient is being treated with chemotherapy for a left breast cancer. EXAM: CHEST - 2 VIEW COMPARISON:  None. FINDINGS: Right-sided injectable port in stable position. Cardiomediastinal silhouette is  normal. Mediastinal contours appear intact. There is no evidence of acute focal airspace consolidation, pleural effusion or pneumothorax. Left lower lobe varicoid bronchiectasis with peribronchial thickening and subpleural scarring is stable from the prior CT. Osseous structures are without acute abnormality. Soft tissues are grossly normal. IMPRESSION: No active cardiopulmonary disease. Chronic varicoid left lower lobe bronchiectasis and peribronchial thickening/scarring. Electronically Signed   By: Fidela Salisbury M.D.   On: 07/07/2018 12:15    ELIGIBLE FOR AVAILABLE RESEARCH PROTOCOL:  Possibly [id number]   ASSESSMENT: 48 y.o. [place] woman status post left breast overlapping site biopsy and left axillary lymph node biopsy 03/16/2018 for a clinical T2 N3, stage IIIC invasive ductal  carcinoma, triple negative, with an MIB-1 of 15%   (1) Staging studies  (a) CT chest on 04/05/2018: left axillary and chronic left retropectoral adenopathy, supraclavicular adenopathy, left breast skin thickening and mass, 81m left upper lobe pulmonary nodule (repeat ct chest in 3-6 months recommended), bronchiectasis in left lower lobe.  (b) CT neck on 04/05/2018: left supraclavicular adenopathy, ? Abnormality in left frontal lobe of brain  (c) Bone scan on 04/05/2018: no findings suggestive of metastatic disease to bone  (d) MRI brain 04/07/2018: negative  (2) neoadjuvant chemotherapy consisting of doxorubicin and cyclophosphamide in dose dense fashion x4 started 04/07/2018, completed 05/19/2018,  followed by weekly carboplatin and paclitaxel x12 started 06/02/2018  (3) genetics testing 11/18: no deleterious mutations.  Genes tested include: AIP, ALK, APC, ATM, AXIN2,BAP1,  BARD1, BLM, BMPR1A, BRCA1, BRCA2, BRIP1, CASR, CDC73, CDH1, CDK4, CDKN1B, CDKN1C, CDKN2A (p14ARF), CDKN2A (p16INK4a), CEBPA, CHEK2, CTNNA1, DICER1, DIS3L2, EGFR (c.2369C>T, p.Thr790Met variant only), EPCAM (Deletion/duplication testing only), FH,  FLCN, GATA2, GPC3, GREM1 (Promoter region deletion/duplication testing only), HOXB13 (c.251G>A, p.Gly84Glu), HRAS, KIT, MAX, MEN1, MET, MITF (c.952G>A, p.Glu318Lys variant only), MLH1, MSH2, MSH3, MSH6, MUTYH, NBN, NF1, NF2, NTHL1, PALB2, PDGFRA, PHOX2B, PMS2, POLD1, POLE, POT1, PRKAR1A, PTCH1, PTEN, RAD50, RAD51C, RAD51D, RB1, RECQL4, RET, RUNX1, SDHAF2, SDHA (sequence changes only), SDHB, SDHC, SDHD, SMAD4, SMARCA4, SMARCB1, SMARCE1, STK11, SUFU, TERC, TERT, TMEM127, TP53, TSC1, TSC2, VHL, WRN and WT1.  (4) definitive surgery to follow  (5) adjuvant radiation   PLAN: MPaceywill proceed to the 10th of 12 planned cycles of carboplatin and docetaxel today.  She continues to tolerate these remarkably well.  She does have a little bit of discomfort on her finger pads that lasted a day or 2 after treatment, she says, but these completely disappear after that and today she has absolutely no neuropathy at all.  She has not told her family that she has cancer and is receiving chemotherapy.  Obviously her mother knows since she is here today.  We are bending over backwards since her sister-in-law is in the back receiving an iron infusion today and we want to make sure we do not break confidentiality  Otherwise she will return to see uKoreain 1 week and we will continue to see her on a weekly basis until she completes her treatment.  Her last chemotherapy is scheduled for 08/18/2018 and I am putting her in for breast MRI the following week, which will be her preop study  She knows to call for any other issue that may develop before the next visit.  Rick Warnick, GVirgie Dad MD  08/04/18 8:43 AM Medical Oncology and Hematology [facility]598 Fairfield StreetAWalhalla [place] [address]Tel. [phone]   Fax. [phone] I, AJacqualyn Poseyam acting as a sEducation administratorfor GChauncey Cruel MD.   I, GLurline DelMD, have reviewed the above documentation for accuracy and completeness, and I agree  with the above.

## 2018-08-04 ENCOUNTER — Inpatient Hospital Stay: Payer: BC Managed Care – PPO

## 2018-08-04 ENCOUNTER — Inpatient Hospital Stay (HOSPITAL_BASED_OUTPATIENT_CLINIC_OR_DEPARTMENT_OTHER): Payer: BC Managed Care – PPO | Admitting: Oncology

## 2018-08-04 ENCOUNTER — Inpatient Hospital Stay: Payer: BC Managed Care – PPO | Attending: Oncology

## 2018-08-04 VITALS — BP 133/70 | HR 109 | Temp 98.5°F | Resp 20 | Ht 69.0 in | Wt 207.6 lb

## 2018-08-04 DIAGNOSIS — C50412 Malignant neoplasm of upper-outer quadrant of left female breast: Secondary | ICD-10-CM | POA: Diagnosis present

## 2018-08-04 DIAGNOSIS — Z79899 Other long term (current) drug therapy: Secondary | ICD-10-CM | POA: Insufficient documentation

## 2018-08-04 DIAGNOSIS — Z171 Estrogen receptor negative status [ER-]: Secondary | ICD-10-CM | POA: Diagnosis not present

## 2018-08-04 DIAGNOSIS — Z90710 Acquired absence of both cervix and uterus: Secondary | ICD-10-CM | POA: Insufficient documentation

## 2018-08-04 DIAGNOSIS — Z5111 Encounter for antineoplastic chemotherapy: Secondary | ICD-10-CM | POA: Diagnosis present

## 2018-08-04 LAB — CBC WITH DIFFERENTIAL/PLATELET
Abs Immature Granulocytes: 0.01 10*3/uL (ref 0.00–0.07)
Basophils Absolute: 0 10*3/uL (ref 0.0–0.1)
Basophils Relative: 0 %
EOS ABS: 0 10*3/uL (ref 0.0–0.5)
EOS PCT: 0 %
HCT: 33.6 % — ABNORMAL LOW (ref 36.0–46.0)
Hemoglobin: 11.1 g/dL — ABNORMAL LOW (ref 12.0–15.0)
Immature Granulocytes: 0 %
Lymphocytes Relative: 31 %
Lymphs Abs: 0.8 10*3/uL (ref 0.7–4.0)
MCH: 35.5 pg — ABNORMAL HIGH (ref 26.0–34.0)
MCHC: 33 g/dL (ref 30.0–36.0)
MCV: 107.3 fL — ABNORMAL HIGH (ref 80.0–100.0)
Monocytes Absolute: 0.3 10*3/uL (ref 0.1–1.0)
Monocytes Relative: 13 %
Neutro Abs: 1.3 10*3/uL — ABNORMAL LOW (ref 1.7–7.7)
Neutrophils Relative %: 56 %
Platelets: 173 10*3/uL (ref 150–400)
RBC: 3.13 MIL/uL — ABNORMAL LOW (ref 3.87–5.11)
RDW: 15.5 % (ref 11.5–15.5)
WBC: 2.5 10*3/uL — ABNORMAL LOW (ref 4.0–10.5)
nRBC: 0 % (ref 0.0–0.2)

## 2018-08-04 LAB — COMPREHENSIVE METABOLIC PANEL
ALT: 18 U/L (ref 0–44)
AST: 18 U/L (ref 15–41)
Albumin: 4.4 g/dL (ref 3.5–5.0)
Alkaline Phosphatase: 57 U/L (ref 38–126)
Anion gap: 5 (ref 5–15)
BILIRUBIN TOTAL: 0.6 mg/dL (ref 0.3–1.2)
BUN: 10 mg/dL (ref 6–20)
CO2: 28 mmol/L (ref 22–32)
Calcium: 9.3 mg/dL (ref 8.9–10.3)
Chloride: 108 mmol/L (ref 98–111)
Creatinine, Ser: 0.6 mg/dL (ref 0.44–1.00)
GFR calc Af Amer: >60 mL/min (ref >60)
GFR calc non Af Amer: >60 mL/min (ref >60)
Glucose, Bld: 106 mg/dL — ABNORMAL HIGH (ref 70–99)
Potassium: 3.8 mmol/L (ref 3.5–5.1)
Sodium: 141 mmol/L (ref 135–145)
Total Protein: 7 g/dL (ref 6.5–8.1)

## 2018-08-04 MED ORDER — SODIUM CHLORIDE 0.9 % IV SOLN
20.0000 mg | Freq: Once | INTRAVENOUS | Status: AC
  Administered 2018-08-04: 20 mg via INTRAVENOUS
  Filled 2018-08-04: qty 2

## 2018-08-04 MED ORDER — SODIUM CHLORIDE 0.9 % IV SOLN
80.0000 mg/m2 | Freq: Once | INTRAVENOUS | Status: AC
  Administered 2018-08-04: 174 mg via INTRAVENOUS
  Filled 2018-08-04: qty 29

## 2018-08-04 MED ORDER — SODIUM CHLORIDE 0.9 % IV SOLN
300.0000 mg | Freq: Once | INTRAVENOUS | Status: AC
  Administered 2018-08-04: 300 mg via INTRAVENOUS
  Filled 2018-08-04: qty 30

## 2018-08-04 MED ORDER — DIPHENHYDRAMINE HCL 50 MG/ML IJ SOLN
25.0000 mg | Freq: Once | INTRAMUSCULAR | Status: AC
  Administered 2018-08-04: 25 mg via INTRAVENOUS

## 2018-08-04 MED ORDER — DIPHENHYDRAMINE HCL 50 MG/ML IJ SOLN
INTRAMUSCULAR | Status: AC
  Filled 2018-08-04: qty 1

## 2018-08-04 MED ORDER — PALONOSETRON HCL INJECTION 0.25 MG/5ML
0.2500 mg | Freq: Once | INTRAVENOUS | Status: AC
  Administered 2018-08-04: 0.25 mg via INTRAVENOUS

## 2018-08-04 MED ORDER — SODIUM CHLORIDE 0.9 % IV SOLN
Freq: Once | INTRAVENOUS | Status: AC
  Administered 2018-08-04: 10:00:00 via INTRAVENOUS
  Filled 2018-08-04: qty 250

## 2018-08-04 MED ORDER — PALONOSETRON HCL INJECTION 0.25 MG/5ML
INTRAVENOUS | Status: AC
  Filled 2018-08-04: qty 5

## 2018-08-04 MED ORDER — SODIUM CHLORIDE 0.9% FLUSH
10.0000 mL | INTRAVENOUS | Status: DC | PRN
  Administered 2018-08-04: 10 mL
  Filled 2018-08-04: qty 10

## 2018-08-04 MED ORDER — SODIUM CHLORIDE 0.9 % IV SOLN
Freq: Once | INTRAVENOUS | Status: AC
  Administered 2018-08-04: 11:00:00 via INTRAVENOUS
  Filled 2018-08-04: qty 5

## 2018-08-04 MED ORDER — HEPARIN SOD (PORK) LOCK FLUSH 100 UNIT/ML IV SOLN
500.0000 [IU] | Freq: Once | INTRAVENOUS | Status: AC | PRN
  Administered 2018-08-04: 500 [IU]
  Filled 2018-08-04: qty 5

## 2018-08-04 NOTE — Addendum Note (Signed)
Addended by: Chauncey Cruel on: 08/04/2018 09:37 AM   Modules accepted: Orders, Level of Service

## 2018-08-04 NOTE — Progress Notes (Signed)
Per Dr. Jana Hakim: OK to treat with ANC of 1.3 and elevated HR

## 2018-08-10 NOTE — Progress Notes (Addendum)
[place]  Telephone:([phone]) [phone] Fax:(336) [phone]    ID: Paula Berry DOB: [birth year]  MR#: [id number]  VOH#:[id number]  Patient Care Team: Paula Shanks, Berry as PCP - General (Family Medicine) Paula Bookbinder, Berry as Consulting Physician (General Surgery) Paula Berry as Consulting Physician (Oncology) Paula Gibson, Berry as Attending Physician (Radiation Oncology) Paula Farrier, Berry as Consulting Physician (Obstetrics and Gynecology) OTHER Berry:   CHIEF COMPLAINT: Triple negative breast cancer  CURRENT TREATMENT: Neoadjuvant chemotherapy: Carboplatin and paclitaxel x12   HISTORY OF CURRENT ILLNESS: From the original intake note:  The patient had a left mammogram with tomography and ultrasonography 07/12/2015 at the [facility] after screening recall for a possible left breast mass.  This found the breast density to be category C.  There was a small lobulated mass in the lower outer aspect of the left breast, which was not palpable.  Ultrasound showed a small lobulated cyst in that area measuring 0.6 cm.  This was felt to be benign.  In July 2019 she had screening mammography at Dr. Clementeen Hoof office.  I do not have that report but according to the patient it was unremarkable.  In October 2019 however the patient developed left axillary swelling and tenderness and was again set up for left mammography with tomography and left breast ultrasonography at the [facility], 03/11/2018.  Breast density was category C.  Mammography showed numerous markedly enlarged left axillary lymph nodes.  There was periareolar skin thickening.  There was an area of ill-defined distortion in the upper outer left breast and on physical exam there was a firm lump superior and lateral to the left nipple, with swelling of the left axilla.  Ultrasound of the left breast and both upper quadrants found an ill-defined irregular hypoechoic area measuring at least 4.2 cm.   There also multiple scattered cysts.  Ultrasound of the left axilla showed at least 6 abnormal lymph nodes.  On 03/16/2018 the patient underwent right mammography with tomography and right breast ultrasonography at the [facility].  Again breast density was category C.  There were multiple circumscribed equal density masses in the upper outer quadrant of the right breast which appeared stable.  Ultrasound showed diffuse fibrocystic changes.  An additional hypoechoic mass was noted at the 9 o'clock position 3 cm from the nipple measuring 0.8 cm, with no associated vascularity.  A complex solid and cystic mass was noted superficially at the 6 o'clock position 3 cm from the nipple measuring 1.6 cm.  Evaluation of the right axilla was negative.   On 03/18/2018 the patient underwent biopsy of the 2 suspicious areas in the right breast, and they both showed only fibrocystic changes with some chronic inflammation ([id number]).  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  The patient's subsequent history is as detailed below.   INTERVAL HISTORY: Paula Berry returns today for follow-up and treatment of her triple negative breast cancer. She is accompanied by her mother.  She completed four cycles of neoadjuvant chemotherapy with doxorubicin and cyclophosphamide.  Now, she continues on weekly carboplatin and paclitaxel x12. Today is day 1 cycle 11.    She has noted an increase in tenderness in her fingertips.  It is most present over the weekends, and by Wednesday it is improved.  She says that the tenderness will wake her up at night from time to time.  She denies numbness and tingling, and is able to feel things with her fingertips.  She denies any issues in her toes.    REVIEW OF  SYSTEMS: Paula Berry is doing well otherwise.  She denies fevers, chills, headaches, vision changes, easy bruising bleeding.  She is without nausea, vomiting, bowel/bladder concerns, dysphagia, indigestion, or mucositis.  She hasn't noted chest pain, palpitations, cough, shortness of breath.  She is fatigued, but is remaining active.  A detailed ROS was conducted and was otherwise non contributory.     PAST MEDICAL HISTORY: Past Medical History:  Diagnosis Date   Anemia    had iron infusion 08/12/15   Asthma    Cancer ([place])    Breast cancer    Genital herpes    GERD (gastroesophageal reflux disease)    Headache    migraines 2-3 times monthly   Pneumonia    hospitalizied for pneumonia as a child    PAST SURGICAL HISTORY: Past Surgical History:  Procedure Laterality Date   ABDOMINAL HYSTERECTOMY Bilateral 08/22/2015   Procedure: HYSTERECTOMY ABDOMINAL, BILATERAL SALPINGECTOMY;  Surgeon: Paula Farrier, Berry;  Location: [facility];  Service: Gynecology;  Laterality: Bilateral;   MYOMECTOMY ABDOMINAL APPROACH     PORTACATH PLACEMENT N/A 03/30/2018   Procedure: INSERTION PORT-A-CATH WITH [place];  Surgeon: Paula Bookbinder, Berry;  Location: [facility];  Service: General;  Laterality: N/A;    FAMILY HISTORY: Family History  Problem Relation Age of Onset   Hypertension Unknown    Diabetes Cousin        mat first cousin   Cancer Unknown    Dementia Maternal Grandfather    Lung cancer Paternal Grandmother        d. 63-60   Brain cancer Paternal Aunt        d. 28, father's mat 1/2 sister   Diabetes Maternal Grandmother    Other Paternal Grandfather        d. WWII   Aneurysm Maternal Uncle        brain   Migraines Neg Hx    The patient's parents are in their early 31s as of October 2019.  A paternal aunt had brain cancer at age 47.  A paternal grandmother had lung cancer at age 38.  There is no breast or ovarian cancer history in the family.  However note that the patient has  little information on her mother side of the family.   GYNECOLOGIC HISTORY:  No LMP recorded. Patient has had a hysterectomy. 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  Her husband [name]. works for the [employer].  He is a former patient here, with stage IV colon cancer.  They live alone, with no pets.     ADVANCED DIRECTIVES: Not in place   HEALTH MAINTENANCE: Social History   Tobacco Use   Smoking status: Never Smoker   Smokeless tobacco: Never Used  Substance Use Topics   Alcohol use: Yes    Alcohol/week: 1.0 standard drinks    Types: 1 Glasses of wine per week    Comment: daily   Drug use: No     Colonoscopy: Never  PAP: Status  post hysterectomy  Bone density: Never   Allergies  Allergen Reactions   Neosporin [Neomycin-Bacitracin Zn-Polymyx] Other (See Comments)    Decreased wound healing with blisters    Current Outpatient Medications  Medication Sig Dispense Refill   acetaminophen (TYLENOL) 500 MG tablet Take 1,000 mg by mouth every 8 (eight) hours as needed for moderate pain.     acyclovir ointment (ZOVIRAX) 5 % Apply 1 application topically 4 (four) times daily as needed (outbreaks).   2   albuterol (PROVENTIL HFA;VENTOLIN HFA) 108 (90 Base) MCG/ACT inhaler Inhale 2 puffs into the lungs every 6 (six) hours as needed for wheezing or shortness of breath.      azithromycin (ZITHROMAX) 250 MG tablet 2 tab on day 1 then 1 tab daily until complete 6 each 0   chlorpheniramine-HYDROcodone (TUSSIONEX) 10-8 MG/5ML SUER Take 5 mLs by mouth every 12 (twelve) hours as needed for cough. 140 mL 0   ciprofloxacin (CIPRO) 500 MG tablet Take 1 tablet (500 mg total) by mouth 2 (two) times daily.  14 tablet 0   methocarbamol (ROBAXIN) 500 MG tablet Take 500 mg by mouth 3 (three) times daily as needed for muscle spasms (pain).     montelukast (SINGULAIR) 10 MG tablet Take 10 mg by mouth every evening.     oxyCODONE (OXY IR/ROXICODONE) 5 MG immediate release tablet Take 1 tablet (5 mg total) by mouth every 6 (six) hours as needed for moderate pain, severe pain or breakthrough pain. 8 tablet 0   pantoprazole (PROTONIX) 40 MG tablet Take 40 mg by mouth every evening.     SYMBICORT 160-4.5 MCG/ACT inhaler INL 2 PFS PO BID  3   valACYclovir (VALTREX) 500 MG tablet Take 500 mg by mouth See admin instructions. Take 500 mg twice daily for 3 days at onset of outbreak then take 500 mg once daily until clear     No current facility-administered medications for this visit.     OBJECTIVE:  Vitals:   08/11/18 1004  BP: 114/68  Pulse: (!) 110  Resp: 18  Temp: 98.2 F (36.8 C)  SpO2: 98%     Body mass index is 30.8 kg/m.   Wt Readings from Last 3 Encounters:  08/11/18 208 lb 9.6 oz (94.6 kg)  08/04/18 207 lb 9.6 oz (94.2 kg)  07/28/18 209 lb 8 oz (95 kg)  ECOG FS: 1 - Symptomatic but completely ambulatory GENERAL: Patient is a well appearing female in no acute distress HEENT:  Sclerae anicteric.  Oropharynx clear and moist. No ulcerations or evidence of oropharyngeal candidiasis. Neck is supple.  NODES:  No cervical, supraclavicular, or axillary lymphadenopathy palpated.  BREAST EXAM:  Deferred. LUNGS:  Clear to auscultation bilaterally.  No wheezes or rhonchi. HEART:  Regular rate and rhythm. No murmur appreciated. ABDOMEN:  Soft, nontender.  Positive, normoactive bowel sounds. No organomegaly palpated. MSK:  No focal spinal tenderness to palpation. Full range of motion bilaterally in the upper extremities. EXTREMITIES:  No peripheral edema.   SKIN:  Clear with no obvious rashes or skin changes. No nail dyscrasia. NEURO:  Nonfocal. Well oriented.  Appropriate affect.     LAB  RESULTS:  CMP     Component Value Date/Time   NA 143 08/11/2018 0900   K 4.2 08/11/2018 0900   CL 108 08/11/2018 0900   CO2 23 08/11/2018 0900   GLUCOSE 116 (H) 08/11/2018 0900   BUN 8 08/11/2018 0900   CREATININE 0.67 08/11/2018 0900   CALCIUM 9.0 08/11/2018 0900  PROT 6.9 08/11/2018 0900   ALBUMIN 4.0 08/11/2018 0900   AST 13 (L) 08/11/2018 0900   ALT 13 08/11/2018 0900   ALKPHOS 68 08/11/2018 0900   BILITOT 0.7 08/11/2018 0900   GFRNONAA >60 08/11/2018 0900   GFRAA >60 08/11/2018 0900    No results found for: TOTALPROTELP, ALBUMINELP, A1GS, A2GS, BETS, BETA2SER, GAMS, MSPIKE, SPEI  No results found for: KPAFRELGTCHN, LAMBDASER, KAPLAMBRATIO  Lab Results  Component Value Date   WBC 2.4 (L) 08/11/2018   NEUTROABS 1.3 (L) 08/11/2018   HGB 11.4 (L) 08/11/2018   HCT 34.2 (L) 08/11/2018   MCV 107.9 (H) 08/11/2018   PLT 162 08/11/2018      Chemistry      Component Value Date/Time   NA 143 08/11/2018 0900   K 4.2 08/11/2018 0900   CL 108 08/11/2018 0900   CO2 23 08/11/2018 0900   BUN 8 08/11/2018 0900   CREATININE 0.67 08/11/2018 0900      Component Value Date/Time   CALCIUM 9.0 08/11/2018 0900   ALKPHOS 68 08/11/2018 0900   AST 13 (L) 08/11/2018 0900   ALT 13 08/11/2018 0900   BILITOT 0.7 08/11/2018 0900       No results found for: LABCA2  No components found for: FAOZHY865  No results for input(s): INR in the last 168 hours.  No results found for: LABCA2  No results found for: HQI696  No results found for: EXB284  No results found for: XLK440  Lab Results  Component Value Date   CA2729 213.7 (H) 04/07/2018    No components found for: HGQUANT  No results found for: CEA1 / No results found for: CEA1   No results found for: AFPTUMOR  No results found for: CHROMOGRNA  No results found for: PSA1  No visits with results within 3 Day(s) from this visit.  Latest known visit with results is:  Infusion on 08/04/2018  Component Date Value  Ref Range Status   WBC 08/11/2018 2.4* 4.0 - 10.5 K/uL Final   RBC 08/11/2018 3.17* 3.87 - 5.11 MIL/uL Final   Hemoglobin 08/11/2018 11.4* 12.0 - 15.0 g/dL Final   HCT 08/11/2018 34.2* 36.0 - 46.0 % Final   MCV 08/11/2018 107.9* 80.0 - 100.0 fL Final   MCH 08/11/2018 36.0* 26.0 - 34.0 pg Final   MCHC 08/11/2018 33.3  30.0 - 36.0 g/dL Final   RDW 08/11/2018 14.8  11.5 - 15.5 % Final   Platelets 08/11/2018 162  150 - 400 K/uL Final   nRBC 08/11/2018 0.0  0.0 - 0.2 % Final   Neutrophils Relative % 08/11/2018 54  % Final   Neutro Abs 08/11/2018 1.3* 1.7 - 7.7 K/uL Final   Lymphocytes Relative 08/11/2018 35  % Final   Lymphs Abs 08/11/2018 0.8  0.7 - 4.0 K/uL Final   Monocytes Relative 08/11/2018 10  % Final   Monocytes Absolute 08/11/2018 0.2  0.1 - 1.0 K/uL Final   Eosinophils Relative 08/11/2018 0  % Final   Eosinophils Absolute 08/11/2018 0.0  0.0 - 0.5 K/uL Final   Basophils Relative 08/11/2018 1  % Final   Basophils Absolute 08/11/2018 0.0  0.0 - 0.1 K/uL Final   Immature Granulocytes 08/11/2018 0  % Final   Abs Immature Granulocytes 08/11/2018 0.01  0.00 - 0.07 K/uL Final   Performed at [facility], [place] [address] [place], [place] [address]   Sodium 08/11/2018 143  135 - 145 mmol/L Final   Potassium 08/11/2018 4.2  3.5 - 5.1 mmol/L Final   Chloride 08/11/2018 108  98 - 111 mmol/L Final   CO2 08/11/2018 23  22 - 32 mmol/L Final   Glucose, Bld 08/11/2018 116* 70 - 99 mg/dL Final   BUN 08/11/2018 8  6 - 20 mg/dL Final   Creatinine 08/11/2018 0.67  0.44 - 1.00 mg/dL Final   Calcium 08/11/2018 9.0  8.9 - 10.3 mg/dL Final   Total Protein 08/11/2018 6.9  6.5 - 8.1 g/dL Final   Albumin 08/11/2018 4.0  3.5 - 5.0 g/dL Final   AST 08/11/2018 13* 15 - 41 U/L Final   ALT 08/11/2018 13  0 - 44 U/L Final   Alkaline Phosphatase 08/11/2018 68  38 - 126 U/L Final   Total Bilirubin 08/11/2018 0.7  0.3 - 1.2 mg/dL Final   GFR, Est  Non Af Am 08/11/2018 >60  >60 mL/min Final   GFR, Est AFR Am 08/11/2018 >60  >60 mL/min Final   Anion gap 08/11/2018 12  5 - 15 Final   Performed at [facility], [place] [address], [place],  [address]    (this displays the last labs from the last 3 days)  No results found for: TOTALPROTELP, ALBUMINELP, A1GS, A2GS, BETS, BETA2SER, GAMS, MSPIKE, SPEI (this displays SPEP labs)  No results found for: KPAFRELGTCHN, LAMBDASER, KAPLAMBRATIO (kappa/lambda light chains)  No results found for: HGBA, HGBA2QUANT, HGBFQUANT, HGBSQUAN (Hemoglobinopathy evaluation)   No results found for: LDH  No results found for: IRON, TIBC, IRONPCTSAT (Iron and TIBC)  No results found for: FERRITIN  Urinalysis No results found for: COLORURINE, APPEARANCEUR, LABSPEC, PHURINE, GLUCOSEU, HGBUR, BILIRUBINUR, KETONESUR, PROTEINUR, UROBILINOGEN, NITRITE, LEUKOCYTESUR   STUDIES: No results found.  ELIGIBLE FOR AVAILABLE RESEARCH PROTOCOL:  Possibly [id number]   ASSESSMENT: 48 y.o. [place] woman status post left breast overlapping site biopsy and left axillary lymph node biopsy 03/16/2018 for a clinical T2 N3, stage IIIC invasive ductal carcinoma, triple negative, with an MIB-1 of 15%   (1) Staging studies  (a) CT chest on 04/05/2018: left axillary and chronic left retropectoral adenopathy, supraclavicular adenopathy, left breast skin thickening and mass, 36m left upper lobe pulmonary nodule (repeat ct chest in 3-6 months recommended), bronchiectasis in left lower lobe.  (b) CT neck on 04/05/2018: left supraclavicular adenopathy, ? 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  Genes tested include: AIP, ALK, APC, ATM, AXIN2,BAP1,  BARD1, BLM, BMPR1A, BRCA1, BRCA2, BRIP1, CASR, CDC73, CDH1, CDK4, CDKN1B, CDKN1C, CDKN2A (p14ARF), CDKN2A (p16INK4a), CEBPA, CHEK2, CTNNA1, DICER1, DIS3L2, EGFR (c.2369C>T, p.Thr790Met variant only), EPCAM (Deletion/duplication testing only), FH, FLCN, GATA2, GPC3, GREM1 (Promoter region deletion/duplication testing only), HOXB13 (c.251G>A, p.Gly84Glu), HRAS, KIT, MAX, MEN1, MET, MITF (c.952G>A, p.Glu318Lys variant only), MLH1, MSH2, MSH3, MSH6, MUTYH, NBN, NF1, NF2, NTHL1, PALB2, PDGFRA, PHOX2B, PMS2, POLD1, POLE, POT1, PRKAR1A, PTCH1, PTEN, RAD50, RAD51C, RAD51D, RB1, RECQL4, RET, RUNX1, SDHAF2, SDHA (sequence changes only), SDHB, SDHC, SDHD, SMAD4, SMARCA4, SMARCB1, SMARCE1, STK11, SUFU, TERC, TERT, TMEM127, TP53, TSC1, TSC2, VHL, WRN and WT1.  (4) definitive surgery to follow  (5) adjuvant radiation   PLAN: MLeahais doing well  today.  She met with myself and Dr. Jana Hakim to review the issue of tenderness in her fingertips.  She is able to discern the feeling of different textures on her fingertips when tested, however since she only has two treatments left, the tenderness in her fingertips waking her up at night, is very worrisome for early neuropathy.  Due to this, she will stop receiving chemotherapy.    Leanndra will now proceed to surgery.  We will get her breast MRI and get her in with Dr. Iran Planas and Dr. [name].  She is considering bilateral mastectomy without reconstruction.  I encouraged her to visit second to nature to see their breast prosthetics so she understands their appearance, weight, and feel.    Adiana will return after her breast MRI.  I spoke to her briefly about her potential for eligiblity in SWOG [phone] and due to this she will not have her port removed when she  has surgery.  I reviewed some of the main inclusion criteria for that study and an overview of it.    I spoke with Bary Castilla RN, breast navigator about [place], and she will assist with getting the necessary appointments in place.  Mackenzey knows to call for any other issue that may develop before the next visit.  Wilber Bihari, NP  08/11/18 1:10 PM Medical Oncology and Hematology [facility] [address] [place], [place] [address] Tel. [phone]    Fax. [phone]   ADDENDUM: Anasophia has tolerated her chemotherapy remarkably well.  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  I think the better part of valor is to stop at this point instead of creating future permanent disabilities.  In any case the benefit from an additional 2 weekly cycles of Taxol is likely not demonstrable.  Accordingly we are stopping chemotherapy and proceeding directly to surgery.  She will have a breast MRI prior to that procedure and she will return to see me 08/18/2018, by which time hopefully we will have those results to discuss  I am delighted that she has done this well and I am looking forward to good news when she has a definitive surgery.  I personally saw this patient and performed a substantive portion of this encounter with the listed APP documented above.   Chauncey Cruel, [facility] Medical Oncology and Hematology [facility] [address] [place], [place] [address] Tel. [phone]    Fax. [phone]

## 2018-08-11 ENCOUNTER — Inpatient Hospital Stay: Payer: BC Managed Care – PPO

## 2018-08-11 ENCOUNTER — Encounter: Payer: Self-pay | Admitting: Adult Health

## 2018-08-11 ENCOUNTER — Other Ambulatory Visit: Payer: Self-pay

## 2018-08-11 ENCOUNTER — Inpatient Hospital Stay (HOSPITAL_BASED_OUTPATIENT_CLINIC_OR_DEPARTMENT_OTHER): Payer: BC Managed Care – PPO | Admitting: Adult Health

## 2018-08-11 VITALS — BP 114/68 | HR 110 | Temp 98.2°F | Resp 18 | Ht 69.0 in | Wt 208.6 lb

## 2018-08-11 DIAGNOSIS — Z90710 Acquired absence of both cervix and uterus: Secondary | ICD-10-CM

## 2018-08-11 DIAGNOSIS — Z171 Estrogen receptor negative status [ER-]: Secondary | ICD-10-CM

## 2018-08-11 DIAGNOSIS — Z79899 Other long term (current) drug therapy: Secondary | ICD-10-CM | POA: Diagnosis not present

## 2018-08-11 DIAGNOSIS — C50412 Malignant neoplasm of upper-outer quadrant of left female breast: Secondary | ICD-10-CM

## 2018-08-11 LAB — CMP (CANCER CENTER ONLY)
ALT: 13 U/L (ref 0–44)
ANION GAP: 12 (ref 5–15)
AST: 13 U/L — ABNORMAL LOW (ref 15–41)
Albumin: 4 g/dL (ref 3.5–5.0)
Alkaline Phosphatase: 68 U/L (ref 38–126)
BUN: 8 mg/dL (ref 6–20)
CO2: 23 mmol/L (ref 22–32)
Calcium: 9 mg/dL (ref 8.9–10.3)
Chloride: 108 mmol/L (ref 98–111)
Creatinine: 0.67 mg/dL (ref 0.44–1.00)
GFR, Est AFR Am: >60 mL/min (ref >60)
GFR, Estimated: >60 mL/min (ref >60)
Glucose, Bld: 116 mg/dL — ABNORMAL HIGH (ref 70–99)
POTASSIUM: 4.2 mmol/L (ref 3.5–5.1)
Sodium: 143 mmol/L (ref 135–145)
Total Bilirubin: 0.7 mg/dL (ref 0.3–1.2)
Total Protein: 6.9 g/dL (ref 6.5–8.1)

## 2018-08-11 LAB — CBC WITH DIFFERENTIAL/PLATELET
Abs Immature Granulocytes: 0.01 10*3/uL (ref 0.00–0.07)
Basophils Absolute: 0 10*3/uL (ref 0.0–0.1)
Basophils Relative: 1 %
Eosinophils Absolute: 0 10*3/uL (ref 0.0–0.5)
Eosinophils Relative: 0 %
HCT: 34.2 % — ABNORMAL LOW (ref 36.0–46.0)
Hemoglobin: 11.4 g/dL — ABNORMAL LOW (ref 12.0–15.0)
Immature Granulocytes: 0 %
Lymphocytes Relative: 35 %
Lymphs Abs: 0.8 10*3/uL (ref 0.7–4.0)
MCH: 36 pg — AB (ref 26.0–34.0)
MCHC: 33.3 g/dL (ref 30.0–36.0)
MCV: 107.9 fL — ABNORMAL HIGH (ref 80.0–100.0)
Monocytes Absolute: 0.2 10*3/uL (ref 0.1–1.0)
Monocytes Relative: 10 %
NEUTROS ABS: 1.3 10*3/uL — AB (ref 1.7–7.7)
Neutrophils Relative %: 54 %
Platelets: 162 10*3/uL (ref 150–400)
RBC: 3.17 MIL/uL — ABNORMAL LOW (ref 3.87–5.11)
RDW: 14.8 % (ref 11.5–15.5)
WBC: 2.4 10*3/uL — ABNORMAL LOW (ref 4.0–10.5)
nRBC: 0 % (ref 0.0–0.2)

## 2018-08-17 NOTE — Progress Notes (Signed)
Patient was supposed to have had her breast MRI prior to today's visit, but actually it is scheduled for tomorrow.  She accordingly did not show today.  We are rescheduling her visit for 6 weeks from today.  She should have had her surgery by then and we can start discussing additional treatment as needed

## 2018-08-18 ENCOUNTER — Inpatient Hospital Stay: Payer: BC Managed Care – PPO

## 2018-08-18 ENCOUNTER — Inpatient Hospital Stay: Payer: BC Managed Care – PPO | Admitting: Oncology

## 2018-08-18 ENCOUNTER — Telehealth: Payer: Self-pay | Admitting: Oncology

## 2018-08-18 NOTE — Telephone Encounter (Signed)
R/s appt per 3/19 sch message - unable to reach patient - left message and sent reminder letter in the mail.

## 2018-08-19 ENCOUNTER — Other Ambulatory Visit: Payer: Self-pay

## 2018-08-19 ENCOUNTER — Ambulatory Visit
Admission: RE | Admit: 2018-08-19 | Discharge: 2018-08-19 | Disposition: A | Discharge status: Home / Self Care | Payer: BC Managed Care – PPO | Source: Ambulatory Visit | Attending: Oncology | Admitting: Oncology

## 2018-08-19 DIAGNOSIS — C50412 Malignant neoplasm of upper-outer quadrant of left female breast: Secondary | ICD-10-CM

## 2018-08-19 DIAGNOSIS — Z171 Estrogen receptor negative status [ER-]: Principal | ICD-10-CM

## 2018-08-19 MED ORDER — GADOBUTROL 1 MMOL/ML IV SOLN
10.0000 mL | Freq: Once | INTRAVENOUS | Status: AC | PRN
  Administered 2018-08-19: 10 mL via INTRAVENOUS

## 2018-08-23 ENCOUNTER — Telehealth: Payer: Self-pay | Admitting: *Deleted

## 2018-08-23 ENCOUNTER — Other Ambulatory Visit: Payer: Self-pay | Admitting: General Surgery

## 2018-08-23 ENCOUNTER — Encounter: Payer: Self-pay | Admitting: *Deleted

## 2018-08-23 ENCOUNTER — Telehealth: Payer: Self-pay | Admitting: Neurology

## 2018-08-23 ENCOUNTER — Other Ambulatory Visit: Payer: Self-pay | Admitting: *Deleted

## 2018-08-23 DIAGNOSIS — Z171 Estrogen receptor negative status [ER-]: Principal | ICD-10-CM

## 2018-08-23 DIAGNOSIS — C50412 Malignant neoplasm of upper-outer quadrant of left female breast: Secondary | ICD-10-CM

## 2018-08-23 NOTE — Telephone Encounter (Signed)
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 It is believed magnesium may prevent the wave of brain signaling, called cortical spreading depression, which produces the visual and sensory changes that are the common forms of aura."  I prefer magnesium citrate. Magnesium citrate 400mg  to 600mg  daily  I try to always put my ocular migraine patients on this first if they are primarily having problems with the vision loss more than the headache. Or in combination with one of the other headache preventatives if they also have head pain with the aura. I feel Magnesium doesn't work as well for the headache portion but great for aura and vision loss. If patient has ocular migraines (vision loss in one eye) then I don't use triptans, contraindicated.   fyi for this call tomorrow thanks

## 2018-08-23 NOTE — Telephone Encounter (Signed)
Called pt & LVM (ok per DPR) advising that d/t covid 19 pandemic, our office is severely reducing in person visits for at least the next two weeks, in order to minimize the risk to our patients and healthcare providers. Advised that we recommend patient convert to a telemedicine visit. Although there may be some limitations with this type of visit, we will take all precautions to reduce any security or privacy concerns. This will be treated like an in-office visit and we will file with your insurance, and there may be a patient responsible charge related to this service. Advised that we can schedule her with the NP to have her visit over the telephone with her consent. Left office number in message to discuss.

## 2018-08-23 NOTE — Telephone Encounter (Addendum)
Spoke with pt & got her consent for the phone visit and for our office to file with insurance. She agreed and I updated her  Med/sx/fam/social hx, meds, and allergies. Pt stated her eye doctor (Dr. 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 She was dx with breast ca in October and has had chemo although last tx was 3 weeks ago. She was having 10-12 episodes per day but now is only having one per day, even though improved still wants to be seen. I scheduled her to see Amy NP tomorrow via telephone visit at 10:00 AM. I advised pt to be on the lookout for a call from the NP. She verbalized appreciation.

## 2018-08-23 NOTE — Telephone Encounter (Signed)
I called pt and unfortunately missed her. I LVM and asked for call back to get her appt scheduled and to get consent. Left office number in message.

## 2018-08-23 NOTE — Telephone Encounter (Signed)
Pt called wanting to consent to a telephone visit for her appt on the 25th. Please advise.

## 2018-08-23 NOTE — Telephone Encounter (Signed)
See other phone note. Pt is going to have a tele visit with Amy NP on 08/24/2018.

## 2018-08-24 ENCOUNTER — Ambulatory Visit (INDEPENDENT_AMBULATORY_CARE_PROVIDER_SITE_OTHER): Payer: BC Managed Care – PPO | Admitting: Family Medicine

## 2018-08-24 ENCOUNTER — Encounter: Payer: Self-pay | Admitting: Family Medicine

## 2018-08-24 ENCOUNTER — Telehealth: Payer: Self-pay | Admitting: Family Medicine

## 2018-08-24 ENCOUNTER — Ambulatory Visit: Payer: BC Managed Care – PPO | Admitting: Neurology

## 2018-08-24 ENCOUNTER — Encounter (HOSPITAL_BASED_OUTPATIENT_CLINIC_OR_DEPARTMENT_OTHER): Payer: Self-pay | Admitting: *Deleted

## 2018-08-24 ENCOUNTER — Other Ambulatory Visit: Payer: Self-pay

## 2018-08-24 ENCOUNTER — Telehealth: Payer: Self-pay | Admitting: Radiation Oncology

## 2018-08-24 DIAGNOSIS — G43109 Migraine with aura, not intractable, without status migrainosus: Secondary | ICD-10-CM

## 2018-08-24 NOTE — Progress Notes (Signed)
PATIENT: Paula Berry DOB: 05-16-1971  REASON FOR VISIT: follow up HISTORY FROM: patient  Virtual Visit via Telephone Note  I connected with Paula Berry on 08/24/18 at 10:00 AM EDT by telephone and verified that I am speaking with the correct person using two identifiers.   I discussed the limitations, risks, security and privacy concerns of performing an evaluation and management service by telephone and the availability of in person appointments. I also discussed with the patient that there may be a patient responsible charge related to this service. The patient expressed understanding and agreed to proceed.   History of Present Illness: 08/24/18 Paula Berry is a 48 y.o. female for follow up.  Was last seen in 2017 for concern migraine headaches.  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  She began chemotherapy treatments in November 2019.  She reports that shortly after eating chemotherapy she started having visual disturbance of the left.  She describes a flashing light in the left eye.  She notes that within a few weeks she started having the same occurrence of the right eye.  She states that visual disturbances were happening fairly frequently.  She reports that these findings were discussed with her oncologist who did report potential side effects of migraines associated with chemotherapy.  She was seen by her ophthalmologist who showed concerns for ocular migraines.  She denies any pain or headache with visual disturbance.  She reports that this disturbance can happen multiple times during the day.  Symptoms typically last 30 to 45 minutes and then resolved.  Patient reports that this visual disturbance was occurring multiple times a day nearly every day for a couple of months.  Her last chemotherapy treatment was on Oct 04, 2018.  Since then she states that the vision disturbances have been  less and less frequent.  She has no concerning findings of changes in speech, weakness or gait changes.  No confusion.  She has not taken any medication outside of an occasional Excedrin for migraine.  She reports that her last migraine was July 2019.  She does report very rare migraines with aura.  This usually happens at once a year.   History (copied from Dr Cathren Laine note on 11/20/2015) HPI:  Paula Berry is a 48 y.o. female here as a referral from Dr. Gaetano Net for headache. PMHx TAH/bilateral salpingectomy, migraines.  She has had migraines since 1993. No inciting events. She was in college, they were debilitating. She has been on "everything under the sun".  Was seeing a neurologist at [facility] Dr. Marijean Bravo. They will last 3-5 days. Within the last 3 years she was getting them 1x every 3-4 months. 8 months ago they started worsening and debilitating again. 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 In February she saw her obgyn and she had to have a TAH/bilateral salpingectomy. Now she gets them 2x a month and they last 2-3 days and they can be severe. Her husband has cancer which is stressful. She has tried to push through. They are unilateral, pouding, throbbing, they can travel around and spread, worse in the temples, she takes excedrin migraine and phenergan, +light/sound/smell sensitivity withy nausea and has vomited in the past. Dark quiet rooms help, excedrin might help but worse if she wakes up with the headache. She takes excedrin migraine only once with headache, no medication overuse. She has 15 headache days a month  at least including all headaches. Stress, pineapple, dehydration, pizza sauce triggers it. The triptans never helped the headaches and made her feel bad. Rarely she has an aura, images in her vision but not all the time none in the last year.   Imitrex, zomig, maxalt, relpax not frova. She has tried topamax, phenergan and tylenol3. Fioricet. and other  preventatives she can't remember. Tried advil and alleve for months and never worked. She gets extreme nausea during episodes and difficult to swallow pills, beter with liquids.   Reviewed notes, labs and imaging from outside physicians, which showed:  CBC with anemia 11.1/35.4 08/12/2015 otherwise unremarkable, CMP essentially normal   Observations/Objective:  Generalized: Well developed, in no acute distress  Mentation: Alert oriented to time, place, history taking. Follows all commands speech and language fluent   Assessment and Plan:  48 y.o. year old female  has a past medical history of Anemia, Asthma, Breast cancer ([place]) (03/2018), Cancer ([place]), Genital herpes, GERD (gastroesophageal reflux disease), Headache, and Pneumonia. 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  We have discussed this diagnosis in detail and treatment options.  It is uncertain at this time whether is related to chemotherapy, however, I am reassured that visual disturbances seem less frequent since finishing her chemotherapy on March 5.  She denies any pain with disturbance.  No concerning signs of stroke.  I have advised that she initiate magnesium supplementation over-the-counter at 400 mg daily.  We have also discussed warning signs of stroke and when to seek medical attention immediately.  She verbalizes understanding and agreement with this plan.  We will see her back in office in about 3 months to reevaluate.    No orders of the defined types were placed in this encounter.   No orders of the defined types were placed in this encounter.    Follow Up Instructions:  I discussed the assessment and treatment plan with the patient. The patient was provided an opportunity to ask questions and all were answered. The patient agreed with the plan and demonstrated an understanding of the instructions.   The patient was advised to call back or  seek an in-person evaluation if the symptoms worsen or if the condition fails to improve as anticipated.  I provided 30 minutes of non-face-to-face time during this encounter. Patient at place of residence, provider at place of residence. Maryelizabeth Kaufmann, CMA helped to facilitate visit.   Debbora Presto, NP

## 2018-08-24 NOTE — Telephone Encounter (Signed)
Yes ma'am. Thank you for the info. I will take care of her!

## 2018-08-24 NOTE — Telephone Encounter (Signed)
LVM to schedule 3 mo f/u per Amy Lomax, np

## 2018-08-24 NOTE — Telephone Encounter (Signed)
New message:     Cld pt and lft vcmail to return call to schedule from referral received

## 2018-08-26 NOTE — Progress Notes (Signed)
Ensure pre surgery drink given with instructions to complete by 0530 dos, surgical soap given with instructions, pt verbalized understanding. 

## 2018-08-28 NOTE — Progress Notes (Signed)
Made any corrections needed, and agree with history, physical, neuro exam,assessment and plan as stated.     Hadassah Rana, MD [facility]  

## 2018-08-29 NOTE — Anesthesia Preprocedure Evaluation (Addendum)
Anesthesia Evaluation  Patient identified by MRN, date of birth, ID band Patient awake    Reviewed: Allergy & Precautions, NPO status , Patient's Chart, lab work & pertinent test results  Airway Mallampati: I  TM Distance: >3 FB Neck ROM: Full    Dental no notable dental hx. (+) Teeth Intact, Dental Advisory Given   Pulmonary asthma ,    Pulmonary exam normal breath sounds clear to auscultation       Cardiovascular negative cardio ROS Normal cardiovascular exam Rhythm:Regular Rate:Normal  TTE 03/2018 Normal EF, no valvular abnormalities   Neuro/Psych  Headaches, negative psych ROS   GI/Hepatic Neg liver ROS, GERD  Medicated and Poorly Controlled,  Endo/Other  negative endocrine ROS  Renal/GU negative Renal ROS  negative genitourinary   Musculoskeletal negative musculoskeletal ROS (+)   Abdominal   Peds  Hematology negative hematology ROS (+)   Anesthesia Other Findings Left breast cancer  Reproductive/Obstetrics                           Anesthesia Physical Anesthesia Plan  ASA: II  Anesthesia Plan: General and Regional   Post-op Pain Management:  Regional for Post-op pain   Induction: Intravenous  PONV Risk Score and Plan: 3 and Midazolam, Dexamethasone and Ondansetron  Airway Management Planned: LMA  Additional Equipment:   Intra-op Plan:   Post-operative Plan: Extubation in OR  Informed Consent: I have reviewed the patients History and Physical, chart, labs and discussed the procedure including the risks, benefits and alternatives for the proposed anesthesia with the patient or authorized representative who has indicated his/her understanding and acceptance.     Dental advisory given  Plan Discussed with: CRNA  Anesthesia Plan Comments:         Anesthesia Quick Evaluation

## 2018-08-30 ENCOUNTER — Other Ambulatory Visit: Payer: Self-pay

## 2018-08-30 ENCOUNTER — Telehealth: Payer: Self-pay | Admitting: Radiation Oncology

## 2018-08-30 ENCOUNTER — Ambulatory Visit (HOSPITAL_BASED_OUTPATIENT_CLINIC_OR_DEPARTMENT_OTHER): Payer: BC Managed Care – PPO | Admitting: Anesthesiology

## 2018-08-30 ENCOUNTER — Encounter (HOSPITAL_BASED_OUTPATIENT_CLINIC_OR_DEPARTMENT_OTHER)
Admission: RE | Disposition: A | Discharge status: Home / Self Care | Payer: Self-pay | Source: Home / Self Care | Attending: General Surgery

## 2018-08-30 ENCOUNTER — Ambulatory Visit (HOSPITAL_BASED_OUTPATIENT_CLINIC_OR_DEPARTMENT_OTHER)
Admission: RE | Admit: 2018-08-30 | Discharge: 2018-08-31 | Disposition: A | Discharge status: Home / Self Care | Payer: BC Managed Care – PPO | Attending: General Surgery | Admitting: General Surgery

## 2018-08-30 ENCOUNTER — Encounter (HOSPITAL_BASED_OUTPATIENT_CLINIC_OR_DEPARTMENT_OTHER): Payer: Self-pay | Admitting: *Deleted

## 2018-08-30 DIAGNOSIS — Z9221 Personal history of antineoplastic chemotherapy: Secondary | ICD-10-CM | POA: Insufficient documentation

## 2018-08-30 DIAGNOSIS — Z90710 Acquired absence of both cervix and uterus: Secondary | ICD-10-CM | POA: Diagnosis not present

## 2018-08-30 DIAGNOSIS — R51 Headache: Secondary | ICD-10-CM | POA: Insufficient documentation

## 2018-08-30 DIAGNOSIS — Z171 Estrogen receptor negative status [ER-]: Secondary | ICD-10-CM | POA: Insufficient documentation

## 2018-08-30 DIAGNOSIS — Z881 Allergy status to other antibiotic agents status: Secondary | ICD-10-CM | POA: Diagnosis not present

## 2018-08-30 DIAGNOSIS — C50912 Malignant neoplasm of unspecified site of left female breast: Secondary | ICD-10-CM | POA: Diagnosis not present

## 2018-08-30 DIAGNOSIS — J45909 Unspecified asthma, uncomplicated: Secondary | ICD-10-CM | POA: Insufficient documentation

## 2018-08-30 DIAGNOSIS — Z9012 Acquired absence of left breast and nipple: Secondary | ICD-10-CM

## 2018-08-30 DIAGNOSIS — Z79899 Other long term (current) drug therapy: Secondary | ICD-10-CM | POA: Diagnosis not present

## 2018-08-30 HISTORY — PX: MASTECTOMY WITH AXILLARY LYMPH NODE DISSECTION: SHX5661

## 2018-08-30 SURGERY — MASTECTOMY WITH AXILLARY LYMPH NODE DISSECTION
Anesthesia: Regional | Site: Breast | Laterality: Left

## 2018-08-30 MED ORDER — FENTANYL CITRATE (PF) 100 MCG/2ML IJ SOLN
50.0000 ug | INTRAMUSCULAR | Status: AC | PRN
  Administered 2018-08-30 (×3): 50 ug via INTRAVENOUS
  Administered 2018-08-30: 25 ug via INTRAVENOUS

## 2018-08-30 MED ORDER — SUGAMMADEX SODIUM 200 MG/2ML IV SOLN
INTRAVENOUS | Status: DC | PRN
  Administered 2018-08-30: 200 mg via INTRAVENOUS

## 2018-08-30 MED ORDER — PANTOPRAZOLE SODIUM 40 MG PO TBEC: 40.0000 mg | DELAYED_RELEASE_TABLET | Freq: Every evening | ORAL | Status: DC

## 2018-08-30 MED ORDER — SODIUM CHLORIDE 0.9 % IV SOLN
INTRAVENOUS | Status: DC
  Administered 2018-08-30: 13:00:00 via INTRAVENOUS

## 2018-08-30 MED ORDER — PHENYLEPHRINE 40 MCG/ML (10ML) SYRINGE FOR IV PUSH (FOR BLOOD PRESSURE SUPPORT)
PREFILLED_SYRINGE | INTRAVENOUS | Status: DC | PRN
  Administered 2018-08-30: 120 ug via INTRAVENOUS
  Administered 2018-08-30 (×3): 80 ug via INTRAVENOUS
  Administered 2018-08-30: 160 ug via INTRAVENOUS
  Administered 2018-08-30: 80 ug via INTRAVENOUS
  Administered 2018-08-30: 120 ug via INTRAVENOUS
  Administered 2018-08-30: 80 ug via INTRAVENOUS

## 2018-08-30 MED ORDER — ACETAMINOPHEN 500 MG PO TABS
ORAL_TABLET | ORAL | Status: AC
  Filled 2018-08-30: qty 2

## 2018-08-30 MED ORDER — ONDANSETRON 4 MG PO TBDP: 4.0000 mg | ORAL_TABLET | Freq: Four times a day (QID) | ORAL | Status: DC | PRN

## 2018-08-30 MED ORDER — ONDANSETRON HCL 4 MG/2ML IJ SOLN
INTRAMUSCULAR | Status: AC
  Filled 2018-08-30: qty 2

## 2018-08-30 MED ORDER — FENTANYL CITRATE (PF) 100 MCG/2ML IJ SOLN
INTRAMUSCULAR | Status: AC
  Filled 2018-08-30: qty 2

## 2018-08-30 MED ORDER — MIDAZOLAM HCL 2 MG/2ML IJ SOLN
INTRAMUSCULAR | Status: AC
  Filled 2018-08-30: qty 2

## 2018-08-30 MED ORDER — SUGAMMADEX SODIUM 200 MG/2ML IV SOLN
INTRAVENOUS | Status: AC
  Filled 2018-08-30: qty 2

## 2018-08-30 MED ORDER — PROPOFOL 10 MG/ML IV BOLUS
INTRAVENOUS | Status: DC | PRN
  Administered 2018-08-30: 150 mg via INTRAVENOUS

## 2018-08-30 MED ORDER — NAPROXEN 500 MG PO TABS: 500.0000 mg | ORAL_TABLET | Freq: Two times a day (BID) | ORAL | Status: DC | PRN

## 2018-08-30 MED ORDER — PROPOFOL 10 MG/ML IV BOLUS
INTRAVENOUS | Status: AC
  Filled 2018-08-30: qty 20

## 2018-08-30 MED ORDER — LIDOCAINE 2% (20 MG/ML) 5 ML SYRINGE
INTRAMUSCULAR | Status: DC | PRN
  Administered 2018-08-30: 100 mg via INTRAVENOUS

## 2018-08-30 MED ORDER — CEFAZOLIN SODIUM-DEXTROSE 2-4 GM/100ML-% IV SOLN
INTRAVENOUS | Status: AC
  Filled 2018-08-30: qty 100

## 2018-08-30 MED ORDER — ONDANSETRON HCL 4 MG/2ML IJ SOLN
4.0000 mg | Freq: Four times a day (QID) | INTRAMUSCULAR | Status: DC | PRN
  Administered 2018-08-30: 4 mg via INTRAVENOUS
  Filled 2018-08-30: qty 2

## 2018-08-30 MED ORDER — SCOPOLAMINE 1 MG/3DAYS TD PT72: 1.0000 | MEDICATED_PATCH | Freq: Once | TRANSDERMAL | Status: DC | PRN

## 2018-08-30 MED ORDER — PHENYLEPHRINE 40 MCG/ML (10ML) SYRINGE FOR IV PUSH (FOR BLOOD PRESSURE SUPPORT)
PREFILLED_SYRINGE | INTRAVENOUS | Status: AC
  Filled 2018-08-30: qty 10

## 2018-08-30 MED ORDER — KETOROLAC TROMETHAMINE 15 MG/ML IJ SOLN
15.0000 mg | INTRAMUSCULAR | Status: AC
  Administered 2018-08-30: 15 mg via INTRAVENOUS

## 2018-08-30 MED ORDER — GABAPENTIN 100 MG PO CAPS
100.0000 mg | ORAL_CAPSULE | ORAL | Status: AC
  Administered 2018-08-30: 100 mg via ORAL

## 2018-08-30 MED ORDER — DEXAMETHASONE SODIUM PHOSPHATE 10 MG/ML IJ SOLN
INTRAMUSCULAR | Status: DC | PRN
  Administered 2018-08-30: 10 mg via INTRAVENOUS

## 2018-08-30 MED ORDER — 0.9 % SODIUM CHLORIDE (POUR BTL) OPTIME
TOPICAL | Status: DC | PRN
  Administered 2018-08-30: 500 mL

## 2018-08-30 MED ORDER — ENSURE PRE-SURGERY PO LIQD: 296.0000 mL | Freq: Once | ORAL | Status: DC

## 2018-08-30 MED ORDER — KETOROLAC TROMETHAMINE 15 MG/ML IJ SOLN
INTRAMUSCULAR | Status: AC
  Filled 2018-08-30: qty 1

## 2018-08-30 MED ORDER — MIDAZOLAM HCL 2 MG/2ML IJ SOLN
INTRAMUSCULAR | Status: DC | PRN
  Administered 2018-08-30: 2 mg via INTRAVENOUS

## 2018-08-30 MED ORDER — MIDAZOLAM HCL 2 MG/2ML IJ SOLN
1.0000 mg | INTRAMUSCULAR | Status: DC | PRN
  Administered 2018-08-30: 2 mg via INTRAVENOUS

## 2018-08-30 MED ORDER — ONDANSETRON HCL 4 MG/2ML IJ SOLN
INTRAMUSCULAR | Status: DC | PRN
  Administered 2018-08-30: 4 mg via INTRAVENOUS

## 2018-08-30 MED ORDER — METHOCARBAMOL 500 MG PO TABS
500.0000 mg | ORAL_TABLET | Freq: Three times a day (TID) | ORAL | Status: DC
  Administered 2018-08-30 – 2018-08-31 (×3): 500 mg via ORAL
  Filled 2018-08-30 (×3): qty 1

## 2018-08-30 MED ORDER — LIDOCAINE 2% (20 MG/ML) 5 ML SYRINGE
INTRAMUSCULAR | Status: AC
  Filled 2018-08-30: qty 5

## 2018-08-30 MED ORDER — FENTANYL CITRATE (PF) 100 MCG/2ML IJ SOLN
25.0000 ug | INTRAMUSCULAR | Status: DC | PRN
  Administered 2018-08-30: 25 ug via INTRAVENOUS

## 2018-08-30 MED ORDER — OXYCODONE HCL 5 MG PO TABS
5.0000 mg | ORAL_TABLET | ORAL | Status: DC | PRN
  Administered 2018-08-30: 10 mg via ORAL
  Administered 2018-08-30: 5 mg via ORAL
  Administered 2018-08-31 (×3): 10 mg via ORAL
  Filled 2018-08-30 (×2): qty 2
  Filled 2018-08-30: qty 1
  Filled 2018-08-30 (×2): qty 2

## 2018-08-30 MED ORDER — ACETAMINOPHEN 500 MG PO TABS
1000.0000 mg | ORAL_TABLET | ORAL | Status: AC
  Administered 2018-08-30: 1000 mg via ORAL

## 2018-08-30 MED ORDER — ALBUTEROL SULFATE HFA 108 (90 BASE) MCG/ACT IN AERS: 2.0000 | INHALATION_SPRAY | Freq: Four times a day (QID) | RESPIRATORY_TRACT | Status: DC | PRN

## 2018-08-30 MED ORDER — SIMETHICONE 80 MG PO CHEW: 40.0000 mg | CHEWABLE_TABLET | Freq: Four times a day (QID) | ORAL | Status: DC | PRN

## 2018-08-30 MED ORDER — ROCURONIUM BROMIDE 50 MG/5ML IV SOSY
PREFILLED_SYRINGE | INTRAVENOUS | Status: AC
  Filled 2018-08-30: qty 5

## 2018-08-30 MED ORDER — GABAPENTIN 100 MG PO CAPS
ORAL_CAPSULE | ORAL | Status: AC
  Filled 2018-08-30: qty 1

## 2018-08-30 MED ORDER — DEXAMETHASONE SODIUM PHOSPHATE 10 MG/ML IJ SOLN
INTRAMUSCULAR | Status: AC
  Filled 2018-08-30: qty 1

## 2018-08-30 MED ORDER — CLONIDINE HCL (ANALGESIA) 100 MCG/ML EP SOLN
EPIDURAL | Status: DC | PRN
  Administered 2018-08-30: 100 ug

## 2018-08-30 MED ORDER — CEFAZOLIN SODIUM-DEXTROSE 2-4 GM/100ML-% IV SOLN
2.0000 g | INTRAVENOUS | Status: AC
  Administered 2018-08-30: 2 g via INTRAVENOUS

## 2018-08-30 MED ORDER — ROPIVACAINE HCL 5 MG/ML IJ SOLN
INTRAMUSCULAR | Status: DC | PRN
  Administered 2018-08-30: 30 mL via PERINEURAL

## 2018-08-30 MED ORDER — MORPHINE SULFATE (PF) 4 MG/ML IV SOLN: 1.0000 mg | INTRAVENOUS | Status: DC | PRN

## 2018-08-30 MED ORDER — ACETAMINOPHEN 500 MG PO TABS
1000.0000 mg | ORAL_TABLET | Freq: Four times a day (QID) | ORAL | Status: DC
  Administered 2018-08-30 – 2018-08-31 (×3): 1000 mg via ORAL
  Filled 2018-08-30 (×3): qty 2

## 2018-08-30 MED ORDER — MONTELUKAST SODIUM 10 MG PO TABS: 10.0000 mg | ORAL_TABLET | Freq: Every evening | ORAL | Status: DC

## 2018-08-30 MED ORDER — LACTATED RINGERS IV SOLN
INTRAVENOUS | Status: DC
  Administered 2018-08-30 (×2): via INTRAVENOUS

## 2018-08-30 MED ORDER — BUPIVACAINE HCL (PF) 0.25 % IJ SOLN
INTRAMUSCULAR | Status: AC
  Filled 2018-08-30: qty 30

## 2018-08-30 MED ORDER — ROCURONIUM BROMIDE 10 MG/ML (PF) SYRINGE
PREFILLED_SYRINGE | INTRAVENOUS | Status: DC | PRN
  Administered 2018-08-30: 50 mg via INTRAVENOUS

## 2018-08-30 MED ORDER — MOMETASONE FURO-FORMOTEROL FUM 200-5 MCG/ACT IN AERO: 2.0000 | INHALATION_SPRAY | Freq: Two times a day (BID) | RESPIRATORY_TRACT | Status: DC

## 2018-08-30 SURGICAL SUPPLY — 69 items
ADH SKN CLS APL DERMABOND .7 (GAUZE/BANDAGES/DRESSINGS) ×2
APL PRP STRL LF DISP 70% ISPRP (MISCELLANEOUS) ×1
APPLIER CLIP 11 MED OPEN (CLIP)
APPLIER CLIP 9.375 MED OPEN (MISCELLANEOUS) ×6
APR CLP MED 11 20 MLT OPN (CLIP)
APR CLP MED 9.3 20 MLT OPN (MISCELLANEOUS) ×2
BINDER BREAST LRG (GAUZE/BANDAGES/DRESSINGS) IMPLANT
BINDER BREAST MEDIUM (GAUZE/BANDAGES/DRESSINGS) IMPLANT
BINDER BREAST XLRG (GAUZE/BANDAGES/DRESSINGS) ×2 IMPLANT
BINDER BREAST XXLRG (GAUZE/BANDAGES/DRESSINGS) IMPLANT
BIOPATCH RED 1 DISK 7.0 (GAUZE/BANDAGES/DRESSINGS) ×2 IMPLANT
BIOPATCH RED 1IN DISK 7.0MM (GAUZE/BANDAGES/DRESSINGS) ×2
BLADE CLIPPER SURG (BLADE) IMPLANT
BLADE SURG 10 STRL SS (BLADE) ×3 IMPLANT
BLADE SURG 15 STRL LF DISP TIS (BLADE) ×1 IMPLANT
BLADE SURG 15 STRL SS (BLADE) ×3
CANISTER SUCT 1200ML W/VALVE (MISCELLANEOUS) ×3 IMPLANT
CHLORAPREP W/TINT 26 (MISCELLANEOUS) ×3 IMPLANT
CLIP APPLIE 11 MED OPEN (CLIP) IMPLANT
CLIP APPLIE 9.375 MED OPEN (MISCELLANEOUS) IMPLANT
CLIP VESOCCLUDE SM WIDE 6/CT (CLIP) IMPLANT
CLOSURE WOUND 1/2 X4 (GAUZE/BANDAGES/DRESSINGS) ×2
COVER BACK TABLE REUSABLE LG (DRAPES) ×3 IMPLANT
COVER MAYO STAND REUSABLE (DRAPES) ×3 IMPLANT
COVER WAND RF STERILE (DRAPES) IMPLANT
DECANTER SPIKE VIAL GLASS SM (MISCELLANEOUS) ×3 IMPLANT
DERMABOND ADVANCED (GAUZE/BANDAGES/DRESSINGS) ×4
DERMABOND ADVANCED .7 DNX12 (GAUZE/BANDAGES/DRESSINGS) IMPLANT
DEVICE DISSECT PLASMABLAD 3.0S (MISCELLANEOUS) ×1 IMPLANT
DRAIN CHANNEL 19F RND (DRAIN) ×5 IMPLANT
DRAPE TOP ARMCOVERS (MISCELLANEOUS) ×3 IMPLANT
DRAPE U-SHAPE 76X120 STRL (DRAPES) ×3 IMPLANT
DRAPE UTILITY XL STRL (DRAPES) ×2 IMPLANT
DRSG PAD ABDOMINAL 8X10 ST (GAUZE/BANDAGES/DRESSINGS) ×2 IMPLANT
DRSG TEGADERM 4X4.75 (GAUZE/BANDAGES/DRESSINGS) ×4 IMPLANT
ELECT REM PT RETURN 9FT ADLT (ELECTROSURGICAL) ×3
ELECTRODE REM PT RTRN 9FT ADLT (ELECTROSURGICAL) ×1 IMPLANT
EVACUATOR SILICONE 100CC (DRAIN) ×5 IMPLANT
GAUZE SPONGE 4X4 12PLY STRL (GAUZE/BANDAGES/DRESSINGS) ×3 IMPLANT
GLOVE BIO SURGEON STRL SZ7 (GLOVE) ×5 IMPLANT
GOWN STRL REUS W/ TWL LRG LVL3 (GOWN DISPOSABLE) ×3 IMPLANT
GOWN STRL REUS W/TWL LRG LVL3 (GOWN DISPOSABLE) ×9
HEMOSTAT ARISTA ABSORB 3G PWDR (HEMOSTASIS) ×2 IMPLANT
ILLUMINATOR WAVEGUIDE N/F (MISCELLANEOUS) IMPLANT
LIGHT WAVEGUIDE WIDE FLAT (MISCELLANEOUS) IMPLANT
NDL HYPO 25X1 1.5 SAFETY (NEEDLE) ×1 IMPLANT
NEEDLE HYPO 25X1 1.5 SAFETY (NEEDLE) ×3 IMPLANT
NS IRRIG 1000ML POUR BTL (IV SOLUTION) ×3 IMPLANT
PACK BASIN DAY SURGERY FS (CUSTOM PROCEDURE TRAY) ×3 IMPLANT
PENCIL BUTTON HOLSTER BLD 10FT (ELECTRODE) ×3 IMPLANT
PIN SAFETY STERILE (MISCELLANEOUS) ×3 IMPLANT
PLASMABLADE 3.0S (MISCELLANEOUS) ×3
SHEET MEDIUM DRAPE 40X70 STRL (DRAPES) ×3 IMPLANT
SLEEVE SCD COMPRESS KNEE MED (MISCELLANEOUS) ×3 IMPLANT
SPONGE LAP 18X18 RF (DISPOSABLE) ×5 IMPLANT
SPONGE LAP 4X18 RFD (DISPOSABLE) ×6 IMPLANT
STOCKINETTE IMPERVIOUS LG (DRAPES) ×3 IMPLANT
STRIP CLOSURE SKIN 1/2X4 (GAUZE/BANDAGES/DRESSINGS) ×3 IMPLANT
SUT ETHILON 2 0 FS 18 (SUTURE) ×5 IMPLANT
SUT MNCRL AB 4-0 PS2 18 (SUTURE) ×2 IMPLANT
SUT SILK 2 0 SH (SUTURE) ×3 IMPLANT
SUT VIC AB 3-0 SH 27 (SUTURE) ×3
SUT VIC AB 3-0 SH 27X BRD (SUTURE) ×1 IMPLANT
SUT VICRYL 3-0 CR8 SH (SUTURE) ×2 IMPLANT
SYR CONTROL 10ML LL (SYRINGE) ×6 IMPLANT
TOWEL GREEN STERILE FF (TOWEL DISPOSABLE) ×6 IMPLANT
TUBE CONNECTING 20'X1/4 (TUBING) ×1
TUBE CONNECTING 20X1/4 (TUBING) ×2 IMPLANT
YANKAUER SUCT BULB TIP NO VENT (SUCTIONS) ×3 IMPLANT

## 2018-08-30 NOTE — H&P (Signed)
35 yof who was diagnosed with left breast cancer in october 2019. she had left mm in october that shows c density breasts. US shows a 4.2 cm breast mass and axilla shows markedly enlarged lymph nodes. there are at least 6 abnormal nodes. on right mm there were two indeterminate masses present with no right ax lad. mri shows 4.3 cm mass in upper outer left breast with several additional masses with smaller satelittes extending to nearly 10 cm. there is bulky left axillary lad and abnormal enhancement located at pec muscle. biopsy of the node was positive, biopsy of the breast mass is grade III IDC that is tn with ki of 15%. biopsy of the right sided lesions are fcc and concordant. i placed a port. she has undergone primary chemotherapy with great clinical response. she stopped a couple cycles of taxol early due to neuropathy but otherwise has tolerated well. her staging scans were negative. repeat mri shows great response in left breast and nodes. she is here to discuss surgery.   Past Surgical History Rolm Bookbinder, MD; 08/23/2018 2:16 PM) Breast Biopsy  Bilateral. Hysterectomy (not due to cancer) - Partial  Thyroid Surgery   Diagnostic Studies History Rolm Bookbinder, MD; 08/23/2018 2:16 PM) Colonoscopy  never Mammogram  within last year Pap Smear  1-5 years ago  Allergies Nance Pew, CMA; 08/23/2018 9:50 AM) Neosporin + Pain Relief Max St *DERMATOLOGICALS*  Allergies Reconciled   Medication History Nance Pew, CMA; 08/23/2018 9:51 AM) Protonix (20MG  Tablet DR, Oral) Active. Albuterol (90MCG/ACT Aerosol Soln, Inhalation) Active. Ibuprofen (600MG  Tablet, Oral) Active. Zofran (4MG  Tablet, Oral) Active. Valtrex (500MG  Tablet, Oral) Active. Medications Reconciled  Social History Rolm Bookbinder, MD; 08/23/2018 2:16 PM) Alcohol use  Occasional alcohol use. Caffeine use  Coffee. No drug use  Tobacco use  Never smoker.  Family History Rolm Bookbinder, MD; 08/23/2018 2:16 PM) First Degree Relatives  No pertinent family history   Vitals ([place]; 08/23/2018 9:51 AM) 08/23/2018 9:51 AM Weight: 209.8 lb Height: 70in Body Surface Area: 2.13 m Body Mass Index: 30.1 kg/m  Temp.: 97.40F(Temporal)  Pulse: 103 (Regular)  P.OX: 99% (Room air) BP: 118/62 (Sitting, Left Arm, Standard)   Physical Exam Rolm Bookbinder MD; 08/23/2018 11:07 AM) General Mental Status-Alert. Head and Neck Trachea-midline. Thyroid Gland Characteristics - normal size and consistency. Eye Sclera/Conjunctiva - Bilateral-No scleral icterus. Chest and Lung Exam Chest and lung exam reveals -quiet, even and easy respiratory effort with no use of accessory muscles and on auscultation, normal breath sounds, no adventitious sounds and normal vocal resonance. Breast Nipples-No Discharge. Breast Lump-No Palpable Breast Mass. Cardiovascular Cardiovascular examination reveals -normal heart sounds, regular rate and rhythm with no murmurs. Nerologic Neurologic evaluation reveals -alert and oriented x 3 with no impairment of recent or remote memory. Lymphatic General Lymphatics -Note: no [place] adenopathy. Head & Neck General Head & Neck Lymphatics: Bilateral - Description - Normal. Axillary General Axillary Region: Right - Description - Normal. Note: vague left axillary lad   Assessment & Plan Rolm Bookbinder MD; 08/23/2018 11:11 AM) STAGE III BREAST CANCER IN FEMALE (C50.919) Story: Left mrm we discussed diagnosis and treatment. she has done well with chemo and has good clinical and radiologic response. I think from where we started for this she still needs left mrm. we discussed that surgery, drains, recovery. she is not going to pursue reconstruction at this time. we discussed at length that I did not recommend right mastectomy at this time. will plan to leave port also. discussed  risks of surgery as well. will plan  on scheduling in next week or so. we had long discussion about lymphedema and shoulder dysfunction.

## 2018-08-30 NOTE — Op Note (Signed)
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 Serita Grammes Assistant: Dr. Verita Lamb Anesthesia: General with a pectoral block Estimated blood loss: 100 cc Specimens: 1.  Left breast marked short superior, long lateral 2.  Left axillary contents Complications: None Drains: 2 19 [place] Blake drains Sponge and needle count was correct at completion Disposition to recovery stable  Indications: This is a 48 year old female who was diagnosed with a left breast cancer October 2019.  She had a 4.2 cm breast mass that was triple negative on core biopsy and at least 6 abnormal lymph nodes.  She had a much larger area on MRI that measured about 10 cm with bulky left axillary lymphadenopathy and abnormal enhancement at the pectoralis muscle.  Biopsy the node was also positive.  She had right-sided lesions ended up being benign.  She is undergone primary chemotherapy with a great clinical response and an MRI that also shows a great response.  I think due to her presentation the best option is still to proceed with a modified radical mastectomy.  We discussed this procedure.  Procedure: After informed consent was obtained the patient was first given a pectoral block by anesthesia.  She was given antibiotics.  SCDs were in place.  She was then placed under general anesthesia without complication.  She was prepped and draped in the standard sterile surgical fashion.  A surgical timeout was then performed.  I made a large elliptical incision to encompass the nipple and the areola.  I then created flaps to the clavicle, parasternal area, inframammary fold, and latissimus laterally.  I then remove the breast and the pectoralis fascia from the muscle.  There was no evidence of any gross invasion although it was adherent in a couple of places.  I then  rolled the breast laterally.  The axilla had multiple enlarged lymph nodes that I palpated.  This was also very dense.  I then remove the breast tissue and marked it as above and passed this off the table.  With some difficulty I was able to dissect the axilla.  There were multiple enlarged lymph nodes and a lot of scar tissue present.  The nodes were adherent to the axillary vein.  I used clips and multiple ties to sweep the contents of the axilla caudad and avoid injuring the thoracodorsal bundle as well as the long thoracic nerve.  I then remove this as a specimen and sent it off as well.  Hemostasis was observed in the axilla.  There were no more abnormal nodes present.  I then placed Ar[place] in the axilla.  I then placed 2 19 [place] Blake drains after obtaining hemostasis.  I secured this with 2-0 nylon suture.  I then closed the lateral tissue down to the chest wall with multiple 2-0 Vicryl's.  The skin was closed with 3-0 Vicryl and 4-0 Monocryl.  Dermabond was placed.  She had a breast binder placed.  She tolerated this well was extubated and transferred to recovery stable.

## 2018-08-30 NOTE — Interval H&P Note (Signed)
History and Physical Interval Note:  08/30/2018 8:53 AM  Paula Berry  has presented today for surgery, with the diagnosis of left breast cancer.  The various methods of treatment have been discussed with the patient and family. After consideration of risks, benefits and other options for treatment, the patient has consented to  Procedure(s): LEFT MODIFIED RADICAL MASTECTOMY (Left) as a surgical intervention.  The patient's history has been reviewed, patient examined, no change in status, stable for surgery.  I have reviewed the patient's chart and labs.  Questions were answered to the patient's satisfaction.     Rolm Bookbinder

## 2018-08-30 NOTE — Progress Notes (Signed)
Assisted D. Woodrum with right, ultrasound guided, pectoralis block. Side rails up, monitors on throughout procedure. See vital signs in flow sheet. Tolerated Procedure well.

## 2018-08-30 NOTE — Anesthesia Procedure Notes (Signed)
Anesthesia Regional Block: Pectoralis block   Pre-Anesthetic Checklist: ,, timeout performed, Correct Patient, Correct Site, Correct Laterality, Correct Procedure, Correct Position, site marked, Risks and benefits discussed,  Surgical consent,  Pre-op evaluation,  At surgeon's request and post-op pain management  Laterality: Left  Prep: Maximum Sterile Barrier Precautions used, chloraprep       Needles:  Injection technique: Single-shot  Needle Type: Echogenic Stimulator Needle     Needle Length: 9cm  Needle Gauge: 22     Additional Needles:   Procedures:,,,, ultrasound used (permanent image in chart),,,,  Narrative:  Start time: 08/30/2018 8:26 AM End time: 08/30/2018 8:36 AM Injection made incrementally with aspirations every 5 mL.  Performed by: Personally  Anesthesiologist: Freddrick March, MD  Additional Notes: Monitors applied. No increased pain on injection. No increased resistance to injection. Injection made in 5cc increments. Good needle visualization. Patient tolerated procedure well.

## 2018-08-30 NOTE — Transfer of Care (Signed)
Last Vitals:  Vitals Value Taken Time  BP 121/81 08/30/2018 11:16 AM  Temp    Pulse 73 08/30/2018 11:19 AM  Resp 15 08/30/2018 11:19 AM  SpO2 100 % 08/30/2018 11:19 AM  Vitals shown include unvalidated device data.  Last Pain:  Vitals:   08/30/18 0800  TempSrc: Oral  PainSc: 0-No pain      Patients Stated Pain Goal: 0 (08/30/18 0800)  Immediate Anesthesia Transfer of Care Note  Patient: Paula Berry  Procedure(s) Performed: Procedure(s) (LRB): Left mastectomy with axillary lymph node dissection (Left)  Patient Location: PACU  Anesthesia Type: General  Level of Consciousness: awake, alert  and oriented  Airway & Oxygen Therapy: Patient Spontanous Breathing and Patient connected to face mask oxygen  Post-op Assessment: Report given to PACU RN and Post -op Vital signs reviewed and stable  Post vital signs: Reviewed and stable  Complications: No apparent anesthesia complications

## 2018-08-30 NOTE — Anesthesia Postprocedure Evaluation (Signed)
Anesthesia Post Note  Patient: Paula Berry  Procedure(s) Performed: Left mastectomy with axillary lymph node dissection (Left Breast)     Patient location during evaluation: PACU Anesthesia Type: Regional and General Level of consciousness: awake and alert Pain management: pain level controlled Vital Signs Assessment: post-procedure vital signs reviewed and stable Respiratory status: spontaneous breathing, nonlabored ventilation and respiratory function stable Cardiovascular status: blood pressure returned to baseline and stable Postop Assessment: no apparent nausea or vomiting Anesthetic complications: no    Last Vitals:  Vitals:   08/30/18 1200 08/30/18 1230  BP: 117/81 118/73  Pulse: 67   Resp: 10 12  Temp:  36.7 C  SpO2: 95% 98%    Last Pain:  Vitals:   08/30/18 1230  TempSrc:   PainSc: [place]

## 2018-08-30 NOTE — Anesthesia Procedure Notes (Signed)
Procedure Name: Intubation Date/Time: 08/30/2018 9:22 AM Performed by: Freddrick March, MD Pre-anesthesia Checklist: Patient identified, Emergency Drugs available, Suction available and Patient being monitored Patient Re-evaluated:Patient Re-evaluated prior to induction Oxygen Delivery Method: Circle system utilized Preoxygenation: Pre-oxygenation with 100% oxygen Induction Type: IV induction Ventilation: Mask ventilation without difficulty Laryngoscope Size: Mac and 3 Grade View: Grade I Tube type: Oral Tube size: 7.0 mm Number of attempts: 1 Airway Equipment and Method: Stylet and Oral airway Placement Confirmation: ETT inserted through vocal cords under direct vision,  positive ETCO2 and breath sounds checked- equal and bilateral Secured at: 22 cm Tube secured with: Tape Dental Injury: Teeth and Oropharynx as per pre-operative assessment

## 2018-08-30 NOTE — Telephone Encounter (Signed)
New message:      Lft msg with patient to call back and schedule appt from referral received

## 2018-08-31 ENCOUNTER — Encounter (HOSPITAL_BASED_OUTPATIENT_CLINIC_OR_DEPARTMENT_OTHER): Payer: Self-pay | Admitting: General Surgery

## 2018-08-31 DIAGNOSIS — C50912 Malignant neoplasm of unspecified site of left female breast: Secondary | ICD-10-CM | POA: Diagnosis not present

## 2018-08-31 MED ORDER — OXYCODONE HCL 5 MG PO TABS: 5.0000 mg | ORAL_TABLET | ORAL | 0 refills | Status: DC | PRN

## 2018-08-31 MED ORDER — METHOCARBAMOL 500 MG PO TABS: 500.0000 mg | ORAL_TABLET | Freq: Three times a day (TID) | ORAL | 1 refills | Status: DC

## 2018-08-31 NOTE — Discharge Instructions (Signed)
[facility], [place] [phone]  MASTECTOMY: POST OP INSTRUCTIONS Take 650 mg tylenol every 6 hours for next 72 hours then as needed. Use ice several times daily also. Always review your discharge instruction sheet given to you by the facility where your surgery was performed. IF YOU HAVE DISABILITY OR FAMILY LEAVE FORMS, YOU MUST BRING THEM TO THE OFFICE FOR PROCESSING.   DO NOT GIVE THEM TO YOUR DOCTOR. A prescription for pain medication may be given to you upon discharge.  Take your pain medication as prescribed, if needed.  If narcotic pain medicine is not needed, then you may take acetaminophen (Tylenol), naprosyn (Alleve) or ibuprofen (Advil) as needed. 1. Take your usually prescribed medications unless otherwise directed. 2. If you need a refill on your pain medication, please contact your pharmacy.  They will contact our office to request authorization.  Prescriptions will not be filled after 5pm or on week-ends. 3. You should follow a light diet the first few days after arrival home, such as soup and crackers, etc.  Resume your normal diet the day after surgery. 4. Most patients will experience some swelling and bruising on the chest and underarm.  Ice packs will help.  Swelling and bruising can take several days to resolve. Wear the binder day and night until you return to the office.  5. It is common to experience some constipation if taking pain medication after surgery.  Increasing fluid intake and taking a stool softener (such as Colace) will usually help or prevent this problem from occurring.  A mild laxative (Milk of Magnesia or Miralax) should be taken according to package instructions if there are no bowel movements after 48 hours. 6. May shower 48 hours after surgery.   You may have steri-strips (small skin tapes) in place directly over the incision.  These strips should be left on the skin for 7-10 days. If you have glue it will come off in next couple week.  Any  sutures will be removed at an office visit 7. DRAINS:  If you have drains in place, it is important to keep a list of the amount of drainage produced each day in your drains.  Before leaving the hospital, you should be instructed on drain care.  Call our office if you have any questions about your drains. I will remove your drains when they put out less than 30 cc or ml for 2 consecutive days. 8. ACTIVITIES:  You may resume regular (light) daily activities beginning the next day--such as daily self-care, walking, climbing stairs--gradually increasing activities as tolerated.  You may have sexual intercourse when it is comfortable.  Refrain from any heavy lifting or straining until approved by your doctor. a. You may drive when you are no longer taking prescription pain medication, you can comfortably wear a seatbelt, and you can safely maneuver your car and apply brakes. b. RETURN TO WORK:  __________________________________________________________ 9. You should see your doctor in the office for a follow-up appointment approximately 3-5 days after your surgery.  Your doctors nurse will typically make your follow-up appointment when she calls you with your pathology report.  Expect your pathology report 3-4business days after surgery. 10. OTHER INSTRUCTIONS: ______________________________________________________________________________________________ ____________________________________________________________________________________________ WHEN TO CALL YOUR DR WAKEFIELD: 1. Fever over 101.0 2. Nausea and/or vomiting 3. Extreme swelling or bruising 4. Continued bleeding from incision. 5. Increased pain, redness, or drainage from the incision. The clinic staff is available to answer your questions during regular business hours.  Please dont hesitate  to call and ask to speak to one of the nurses for clinical concerns.  If you have a medical emergency, go to the nearest emergency room or call 911.  A  surgeon from [facility] is always on call at the hospital. [address] [place], [place], [place]  [address] ? [address], [place], [place]   [address] [phone] ? [phone] ? FAX [phone] Web site: [email or web address]  Post Anesthesia Home Care Instructions  Activity: Get plenty of rest for the remainder of the day. A responsible individual must stay with you for 24 hours following the procedure.  For the next 24 hours, DO NOT: -Drive a car -Paediatric nurse -Drink alcoholic beverages -Take any medication unless instructed by your physician -Make any legal decisions or sign important papers.  Meals: Start with liquid foods such as gelatin or soup. Progress to regular foods as tolerated. Avoid greasy, spicy, heavy foods. If nausea and/or vomiting occur, drink only clear liquids until the nausea and/or vomiting subsides. Call your physician if vomiting continues.  Special Instructions/Symptoms: Your throat may feel dry or sore from the anesthesia or the breathing tube placed in your throat during surgery. If this causes discomfort, gargle with warm salt water. The discomfort should disappear within 24 hours.  If you had a scopolamine patch placed behind your ear for the management of post- operative nausea and/or vomiting:  1. The medication in the patch is effective for 72 hours, after which it should be removed.  Wrap patch in a tissue and discard in the trash. Wash hands thoroughly with soap and water. 2. You may remove the patch earlier than 72 hours if you experience unpleasant side effects which may include dry mouth, dizziness or visual disturbances. 3. Avoid touching the patch. Wash your hands with soap and water after contact with the patch.     Post Anesthesia Home Care Instructions  Activity: Get plenty of rest for the remainder of the day. A responsible individual must stay with you for 24 hours following the procedure.  For  the next 24 hours, DO NOT: -Drive a car -Paediatric nurse -Drink alcoholic beverages -Take any medication unless instructed by your physician -Make any legal decisions or sign important papers.  Meals: Start with liquid foods such as gelatin or soup. Progress to regular foods as tolerated. Avoid greasy, spicy, heavy foods. If nausea and/or vomiting occur, drink only clear liquids until the nausea and/or vomiting subsides. Call your physician if vomiting continues.  Special Instructions/Symptoms: Your throat may feel dry or sore from the anesthesia or the breathing tube placed in your throat during surgery. If this causes discomfort, gargle with warm salt water. The discomfort should disappear within 24 hours.  If you had a scopolamine patch placed behind your ear for the management of post- operative nausea and/or vomiting:  1. The medication in the patch is effective for 72 hours, after which it should be removed.  Wrap patch in a tissue and discard in the trash. Wash hands thoroughly with soap and water. 2. You may remove the patch earlier than 72 hours if you experience unpleasant side effects which may include dry mouth, dizziness or visual disturbances. 3. Avoid touching the patch. Wash your hands with soap and water after contact with the patch.        JP Drain [employer] this sheet to all of your post-operative appointments while you have your drains.  Please measure your drains by CC's or ML's.  Make sure you drain and measure your JP Drains 2 or 3 times per day.  At the end of each day, add up totals for the left side and add up totals for the right side.    ( 9 am )     ( 3 pm )        ( 9 pm )                Date L  R  L  R  L  R  Total L/R

## 2018-08-31 NOTE — Discharge Summary (Signed)
Physician Discharge Summary  Patient ID: Paula Berry MRN: [id number] DOB/AGE: [birth year] 48 y.o.  Admit date: 08/30/2018 Discharge date: 08/31/2018  Admission Diagnoses: Left breast cancer Discharge Diagnoses:  Active Problems:   S/P mastectomy, left   Discharged Condition: good  Hospital Course: 54 yof s/p primary chemotherapy who now underwent left mrm. She has done well overnight and will be discharged  Consults: None  Significant Diagnostic Studies: none  Treatments: surgery: left mrm  Discharge Exam: Blood pressure 110/63, pulse 77, temperature 98.5 F (36.9 C), resp. rate 18, height 5\' 10"  (1.778 m), weight 96 kg, SpO2 99 %. flaps viable, no hematoma, drains serosang  Disposition: Discharge disposition: 01-Home or Self Care        Allergies as of 08/31/2018      Reactions   Neosporin [neomycin-bacitracin Zn-polymyx] Other (See Comments)   Decreased wound healing with blisters      Medication List    TAKE these medications   acetaminophen 500 MG tablet Commonly known as:  TYLENOL Take 1,000 mg by mouth every 8 (eight) hours as needed for moderate pain.   acyclovir ointment 5 % Commonly known as:  ZOVIRAX Apply 1 application topically 4 (four) times daily as needed (outbreaks).   albuterol 108 (90 Base) MCG/ACT inhaler Commonly known as:  PROVENTIL HFA;VENTOLIN HFA Inhale 2 puffs into the lungs every 6 (six) hours as needed for wheezing or shortness of breath.   methocarbamol 500 MG tablet Commonly known as:  ROBAXIN Take 1 tablet (500 mg total) by mouth 3 (three) times daily.   montelukast 10 MG tablet Commonly known as:  SINGULAIR Take 10 mg by mouth every evening.   oxyCODONE 5 MG immediate release tablet Commonly known as:  Oxy IR/ROXICODONE Take 1 tablet (5 mg total) by mouth every 4 (four) hours as needed for moderate pain.   pantoprazole 40 MG tablet Commonly known as:  PROTONIX Take 40 mg by mouth every evening.    Symbicort 160-4.5 MCG/ACT inhaler Generic drug:  budesonide-formoterol INL 2 PFS PO BID   valACYclovir 500 MG tablet Commonly known as:  VALTREX Take 500 mg by mouth See admin instructions. Take 500 mg twice daily for 3 days at onset of outbreak then take 500 mg once daily until clear      Follow-up Information    Rolm Bookbinder, MD In 2 weeks.   Specialty:  General Surgery Contact information: [place] [address] [phone]           Signed: Rolm Bookbinder 08/31/2018, 8:06 AM

## 2018-08-31 NOTE — Addendum Note (Signed)
Addendum  created 08/31/18 0803 by Lynore Coscia, Ernesta Amble, CRNA   Charge Capture section accepted

## 2018-09-06 ENCOUNTER — Other Ambulatory Visit: Payer: Self-pay | Admitting: Oncology

## 2018-09-14 NOTE — Progress Notes (Signed)
Location of Breast Cancer: Left Breast  Histology per Pathology Report:  03/17/19 Diagnosis 1. Breast, left, needle core biopsy, 1 o'clock, 1cmfn - INVASIVE MAMMARY CARCINOMA. - SEE MICROSCOPIC DESCRIPTION. 2. Lymph node, needle/core biopsy, left axilla - LYMPH NODE WITH METASTATIC CARCINOMA.  Receptor Status: ER(NEG), PR (NEG), Her2-neu (NEG), Ki-(15%)  08/30/18 Diagnosis 1. Breast, simple mastectomy, Left - INVASIVE DUCTAL CARCINOMA, RESIDUAL, STATUS POST NEOADJUVANT TREATMENT. - MARGINS OF RESECTION ARE NOT INVOLVED (CLOSEST MARGIN: 4 CM, DEEP). - BIOPSY SITE CHANGES. - SEE ONCOLOGY TABLE. 2. Lymph nodes, regional resection, Left Axillary - TWELVE LYMPH NODES, NEGATIVE FOR CARCINOMA (0/12).  Did patient present with symptoms or was this found on screening mammography?: She developed left axillary swelling and tenderness and was set up for left mammography with tomography and left breast ultrasonography at the [facility], 03/11/2018.   Past/Anticipated interventions by surgeon, if any: 08/30/18 Postoperative diagnosis: Same as above Procedure: Left modified radical mastectomy Surgeon: Dr. Serita Grammes Assistant: Dr. Verita Lamb  Past/Anticipated interventions by medical oncology, if any:  08/11/18 Dr. Jana Hakim Wilber Bihari NP  Neoadjuvant chemotherapy consisting of doxorubicin and cyclophosphamide in dose dense fashion x4 started 04/07/2018, completed 05/19/2018,  followed by weekly carboplatin and paclitaxel x10 started 06/02/2018, last dose 08/04/2018 (stopped two cycles early due to peripheral neuropathy)  Definitive surgery to follow  Adjuvant radiation PLAN: Paula Berry is doing well today.  She met with myself and Dr. Jana Hakim to review the issue of tenderness in her fingertips.  She is able to discern the feeling of different textures on her fingertips when tested, however since she only has two treatments left, the tenderness in her fingertips waking her up at night,  is very worrisome for early neuropathy.  Due to this, she will stop receiving chemotherapy.   Paula Berry will now proceed to surgery.  We will get her breast MRI and get her in with Dr. Iran Planas and Dr. [name].  She is considering bilateral mastectomy without reconstruction.  I encouraged her to visit second to nature to see their breast prosthetics so she understands their appearance, weight, and feel.    Lymphedema issues, if any:  She denies. She has good arm movement.   Pain issues, if any:  No  SAFETY ISSUES:  Prior radiation? No  Pacemaker/ICD? No  Possible current pregnancy? No, history of a hysterectomy  Is the patient on methotrexate? No  Current Complaints / other details:      Paula Berry, Paula Police, RN 09/14/2018,11:11 AM

## 2018-09-15 ENCOUNTER — Ambulatory Visit: Payer: BC Managed Care – PPO | Attending: General Surgery | Admitting: Rehabilitation

## 2018-09-15 ENCOUNTER — Encounter: Payer: Self-pay | Admitting: Rehabilitation

## 2018-09-15 ENCOUNTER — Other Ambulatory Visit: Payer: Self-pay

## 2018-09-15 DIAGNOSIS — Z171 Estrogen receptor negative status [ER-]: Secondary | ICD-10-CM | POA: Diagnosis present

## 2018-09-15 DIAGNOSIS — C50412 Malignant neoplasm of upper-outer quadrant of left female breast: Secondary | ICD-10-CM | POA: Diagnosis present

## 2018-09-15 DIAGNOSIS — R293 Abnormal posture: Secondary | ICD-10-CM

## 2018-09-15 DIAGNOSIS — M25612 Stiffness of left shoulder, not elsewhere classified: Secondary | ICD-10-CM

## 2018-09-15 NOTE — Therapy (Signed)
[place], [place], [address] Phone: [phone]   Fax:  [phone]  Physical Therapy Evaluation  Patient Details  Name: Paula Berry MRN: [id number] Date of Birth: 09/06/1970 Referring Provider (PT): Dr. [name]    Encounter Date: 09/15/2018  PT End of Session - 09/15/18 1213    Visit Number  1    Number of Visits  7    Date for PT Re-Evaluation  10/27/18    PT Start Time  0915    PT Stop Time  0958    PT Time Calculation (min)  43 min    Activity Tolerance  Patient tolerated treatment well    Behavior During Therapy  [facility] for tasks assessed/performed       Past Medical History:  Diagnosis Date  . Anemia    had iron infusion 08/12/15  . Asthma   . Breast cancer ([place]) 03/2018  . Cancer [facility])    Breast cancer   . Genital herpes   . GERD (gastroesophageal reflux disease)   . Headache    migraines 2-3 times monthly  . Pneumonia    hospitalizied for pneumonia as a child    Past Surgical History:  Procedure Laterality Date  . ABDOMINAL HYSTERECTOMY Bilateral 08/22/2015   Procedure: HYSTERECTOMY ABDOMINAL, BILATERAL SALPINGECTOMY;  Surgeon: Everlene Farrier, MD;  Location: [facility];  Service: Gynecology;  Laterality: Bilateral;  . MASTECTOMY WITH AXILLARY LYMPH NODE DISSECTION Left 08/30/2018   Procedure: Left mastectomy with axillary lymph node dissection;  Surgeon: Rolm Bookbinder, MD;  Location: [place];  Service: General;  Laterality: Left;  . MYOMECTOMY ABDOMINAL APPROACH    . PORTACATH PLACEMENT N/A 03/30/2018   Procedure: INSERTION PORT-A-CATH WITH [place];  Surgeon: Rolm Bookbinder, MD;  Location: [facility] [place];  Service: General;  Laterality: N/A;    There were no vitals filed for this visit.   Subjective Assessment - 09/15/18 0919    Subjective  Radiation sim in 09/28/18.  I started exercising after surgery. I got an exercise sheet from Dr. [name] last  week.     Pertinent History  S/p Lt modified radical mastectomy on 08/30/18 with Dr. [name].  SLNB x 12 lymph nodes all negative. She completed four cycles of neoadjuvant chemotherapy with doxorubicin and cyclophosphamide.  Now, she continues on weekly carboplatin and paclitaxel x12. Radiation to begin soon. History of migraines    Patient Stated Goals  get ready for radiation    Currently in Pain?  Yes    Pain Score  2     Pain Location  Axilla    Pain Orientation  Left    Pain Descriptors / Indicators  Aching    Pain Type  Surgical pain    Pain Onset  1 to 4 weeks ago    Pain Frequency  Intermittent    Aggravating Factors   movement    Pain Relieving Factors  tylenol         [facility] PT Assessment - 09/15/18 0001      Assessment   Medical Diagnosis  lt mastectomy     Referring Provider (PT)  Dr. [name]     Onset Date/Surgical Date  08/30/18    Hand Dominance  Right    Next MD Visit  09/28/18      Precautions   Precaution Comments  lymphedema, cancer history      Restrictions   Weight Bearing Restrictions  No      Balance Screen  Has the patient fallen in the past 6 months  No    Has the patient had a decrease in activity level because of a fear of falling?   No    Is the patient reluctant to leave their home because of a fear of falling?   No      Home Film/video editor residence    Living Arrangements  Spouse/significant other      Prior Function   Level of Independence  Independent    Vocation  Part time employment    Pharmacologist; therapist    Leisure  normally gym; walking alot since surgery      Cognition   Overall Cognitive Status  Within Functional Limits for tasks assessed      Observation/Other Assessments   Observations  no cording evident. tightness in the axilla. some steri strips still present      Sensation   Light Touch  Appears Intact      Coordination   Gross Motor Movements are Fluid and Coordinated   Yes      Posture/Postural Control   Posture/Postural Control  Postural limitations    Postural Limitations  Rounded Shoulders;Forward head      ROM / Strength   AROM / PROM / Strength  AROM;PROM      AROM   AROM Assessment Site  Shoulder    Right/Left Shoulder  Right;Left    Right Shoulder Flexion  153 Degrees    Right Shoulder ABduction  153 Degrees    Right Shoulder Internal Rotation  80 Degrees    Right Shoulder External Rotation  90 Degrees    Left Shoulder Flexion  124 Degrees    Left Shoulder ABduction  100 Degrees    Left Shoulder Internal Rotation  80 Degrees    Left Shoulder External Rotation  80 Degrees      PROM   PROM Assessment Site  Shoulder    Right/Left Shoulder  Left    Left Shoulder Flexion  135 Degrees    Left Shoulder ABduction  146 Degrees    Left Shoulder External Rotation  82 Degrees        LYMPHEDEMA/ONCOLOGY QUESTIONNAIRE - 09/15/18 0946      Type   Cancer Type  Left breast cancer      Surgeries   Mastectomy Date  08/30/18    Sentinel Lymph Node Biopsy Date  08/30/18    Number Lymph Nodes Removed  12      Treatment   Active Chemotherapy Treatment  No    Past Chemotherapy Treatment  Yes    Active Radiation Treatment  No    Past Radiation Treatment  No    Current Hormone Treatment  No    Past Hormone Therapy  No      What other symptoms do you have   Are you Having Heaviness or Tightness  No    Are you having Pain  No      Lymphedema Assessments   Lymphedema Assessments  Upper extremities      Right Upper Extremity Lymphedema   15 cm Proximal to Olecranon Process  33.5 cm    10 cm Proximal to Olecranon Process  31.7 cm    Olecranon Process  26.5 cm    15 cm Proximal to Ulnar Styloid Process  24.5 cm    10 cm Proximal to Ulnar Styloid Process  19.7 cm    Just Proximal to Ulnar Styloid Process  15.8 cm    Across Hand at [employer]  18.5 cm    At [place] of 2nd Digit  5.9 cm      Left Upper Extremity Lymphedema   15 cm Proximal  to Olecranon Process  33 cm    10 cm Proximal to Olecranon Process  31 cm    Olecranon Process  25.8 cm    15 cm Proximal to Ulnar Styloid Process  22.7 cm    10 cm Proximal to Ulnar Styloid Process  19 cm    Just Proximal to Ulnar Styloid Process  16 cm    Across Hand at [employer]  18.9 cm    At [place] of 2nd Digit  5.9 cm          Quick Dash - 09/15/18 0001    Open a tight or new jar  Mild difficulty    Do heavy household chores (wash walls, wash floors)  Mild difficulty    Carry a shopping bag or briefcase  Mild difficulty    Wash your back  Mild difficulty    Use a knife to cut food  Mild difficulty    Recreational activities in which you take some force or impact through your arm, shoulder, or hand (golf, hammering, tennis)  Mild difficulty    During the past week, to what extent has your arm, shoulder or hand problem interfered with your normal social activities with family, friends, neighbors, or groups?  Modererately    During the past week, to what extent has your arm, shoulder or hand problem limited your work or other regular daily activities  Modererately    Arm, shoulder, or hand pain.  Moderate    Tingling (pins and needles) in your arm, shoulder, or hand  None    Difficulty Sleeping  Mild difficulty    DASH Score  29.55 %        Objective measurements completed on examination: See above findings.      [facility] Adult PT Treatment/Exercise - 09/15/18 0001      Self-Care   Self-Care  Other Self-Care Comments    Other Self-Care Comments   education on lymphedema, use of compression, and what to look for. Risk reduction handoutgiven      Exercises   Exercises  Other Exercises    Other Exercises   to continue post op stretches pt just started this week with flexion and behind the head supine             PT Education - 09/15/18 1212    Education Details  lymphedema, risk reduction, HEP    Person(s) Educated  Patient    Methods   Explanation;Demonstration;Handout    Comprehension  Verbalized understanding;Returned demonstration          PT Long Term Goals - 09/15/18 1218      PT LONG TERM GOAL #1   Title  Pt will tolerate radiation without increased shoulder pain    Time  6    Period  Weeks    Status  New      PT LONG TERM GOAL #2   Title  Pt will increase Lt shoulder AROM to WNL without pain and pulling     Time  6    Period  Weeks      PT LONG TERM GOAL #3   Title  Pt will be educated on strength ABC program to decrease shoulder dysfunction    Time  6  Period  Weeks    Status  New      PT LONG TERM GOAL #4   Title  pt will be educated on lymphedema precautions and signs and symptoms              Plan - 09/15/18 1214    Clinical Impression Statement  Pt presents today post Lt mastectomy and chemotherapy with radiation beginning the start of May.  Pt has decreased shoulder ROM, pain and pulling in the axilla, and healing incisions with steristrips still present.  Pt just started post op stretches last week and reports the arm has started moving better already.  Pt able to obtain radiation position but with discomfort of 3/10. Pt was educated on stretching modifications, lymphedema and risk reduction.  Pt is interested in getting a compression sleeve prophylactically.  PT will call to check in in 1-2 weeks for email vs telehealth visits.      Examination-Activity Limitations  Lift;Carry;Reach Overhead    Examination-Participation Restrictions  Cleaning;Community Activity;Yard Work    Stability/Clinical Decision Making  Stable/Uncomplicated    Designer, jewellery  Low    Rehab Potential  Excellent    PT Frequency  1x / week    PT Duration  6 weeks    PT Treatment/Interventions  ADLs/Self Care Home Management;Manual techniques;Therapeutic exercise;Taping;Therapeutic activities    PT Next Visit Plan  PT phone call visit and update stretches from [place]  post op  stretches    Recommended Other Services  compression sleeve    Consulted and Agree with Plan of Care  Patient       Patient will benefit from skilled therapeutic intervention in order to improve the following deficits and impairments:  Decreased skin integrity, Decreased range of motion, Decreased scar mobility, Decreased activity tolerance, Decreased knowledge of use of DME, Decreased strength, Impaired UE functional use, Pain  Visit Diagnosis: Malignant neoplasm of upper-outer quadrant of left breast in female, estrogen receptor negative (HCC)  Abnormal posture  Stiffness of left shoulder, not elsewhere classified     Problem List Patient Active Problem List   Diagnosis Date Noted  . S/P mastectomy, left 08/30/2018  . 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 Bronchiectasis ([place]) 07/21/2018  . Genetic testing 05/03/2018  . Port-A-Cath in place 04/07/2018  . Malignant neoplasm of upper-outer quadrant of left breast in female, estrogen receptor negative ([place]) 03/21/2018  . Migraine 11/21/2015  . Fibroids 08/22/2015    Shan Levans, PT 09/15/2018, 12:20 PM  [place], [place], [address] Phone: [phone]   Fax:  [phone]  Name: Bayleigh Loflin MRN: [id number] Date of Birth: 20-May-1971

## 2018-09-15 NOTE — Patient Instructions (Signed)
education on lymphedema, use of compression, and what to look for. Risk reduction handoutgiven  Post op exercises supine stretches  PT will follow up in 1 week-2 weeks

## 2018-09-26 ENCOUNTER — Telehealth: Payer: Self-pay | Admitting: Oncology

## 2018-09-26 ENCOUNTER — Telehealth: Payer: Self-pay | Admitting: Radiation Oncology

## 2018-09-26 NOTE — Telephone Encounter (Signed)
R/s appt per 4/24 sch message - unable to reach patient. Left message with appt date and time

## 2018-09-26 NOTE — Telephone Encounter (Signed)
New Message:    LVM to inform the patient that Dr. Isidore Moos would like for her to come into the office for her appt tomorrow 09/27/18.

## 2018-09-26 NOTE — Progress Notes (Addendum)
Radiation Oncology         [phone]) [phone] ________________________________  Name: Paula Berry MRN: [id number]  Date: 09/27/2018  DOB: [birth year]  Follow-Up OUTPATIENT Note  Outpatient  CC: Vernie Shanks, MD  Magrinat, Virgie Dad, MD  Diagnosis:      ICD-10-CM   1. Malignant neoplasm of upper-outer quadrant of left breast in female, estrogen receptor negative ([place]) C50.412    Z17.1      Cancer Staging Malignant neoplasm of upper-outer quadrant of left breast in female, estrogen receptor negative ([place]) Staging form: Breast, AJCC 8th Edition - Clinical stage from 03/23/2018: Stage IIIB (cT2, cN1, cM0, G3, ER-, PR-, HER2-) - Unsigned - Pathologic: No Stage Recommended (ypT1a, pN0, cM0, G2, ER-, PR-, HER2-) - Signed by Eppie Gibson, MD on 09/28/2018   CHIEF COMPLAINT: Here to discuss management of left breast cancer  Narrative:  The patient returns today for follow-up to discuss radiation treatment options.     Since consultation date, she underwent staging neck and chest CTs and bone scan on 04/05/2018. Chest CT showed bulky left axillary adenopathy extending into SCV region, as well as a solitary tiny 3 mm peripheral left upper lobe pulmonary nodule. Neck CT and bone scan were negative for metastases. Brain MRI on 04/07/2018 was also negative.   She was treated with systemic therapy by Dr. Jana Hakim: doxorubicin and cyclophosphamidein dose dense fashion x4 started 04/07/2018, completed 05/19/2018, followed by weeklycarboplatin and paclitaxelx10started 06/02/2018, last dose 08/04/2018 (stopped two cycles early due to peripheral neuropathy).  She underwent post-treatment bilateral breast MRI on 08/19/2018, showing: significant positive response to neoadjuvant chemotherapy, specifically the bulky mass in the anterior third of the left breast has essentially resolved; a few small enhancing masses in the left breast; previously seen bulky left axillary lymphadenopathy  significantly decreased in size.  She opted to proceed with left mastectomy with axillary lymph node dissection on date of 08/30/2018 with pathology report revealing: tumor size of 0.4 cm (s/p neoadjuvant treatment); histology of invasive ductal carcinoma; margin status to invasive disease of negative by 4 cm, deep; nodal status of negative for carcinoma (0/12, complete pathologic response); triple negative, prognostic profile not repeated; Grade 2.  Symptomatically, the patient reports:   Lymphedema issues, if any:  She denies. She has good arm movement.   Pain issues, if any:  No        ALLERGIES:  is allergic to neosporin [neomycin-bacitracin zn-polymyx].  Meds: Current Outpatient Medications  Medication Sig Dispense Refill   acetaminophen (TYLENOL) 500 MG tablet Take 1,000 mg by mouth every 8 (eight) hours as needed for moderate pain.     acyclovir ointment (ZOVIRAX) 5 % Apply 1 application topically 4 (four) times daily as needed (outbreaks).   2   albuterol (PROVENTIL HFA;VENTOLIN HFA) 108 (90 Base) MCG/ACT inhaler Inhale 2 puffs into the lungs every 6 (six) hours as needed for wheezing or shortness of breath.      methocarbamol (ROBAXIN) 500 MG tablet Take 1 tablet (500 mg total) by mouth 3 (three) times daily. 20 tablet 1   montelukast (SINGULAIR) 10 MG tablet Take 10 mg by mouth every evening.     pantoprazole (PROTONIX) 40 MG tablet Take 40 mg by mouth every evening.     SYMBICORT 160-4.5 MCG/ACT inhaler INL 2 PFS PO BID  3   valACYclovir (VALTREX) 500 MG tablet Take 500 mg by mouth See admin instructions. Take 500 mg twice daily for 3 days at onset of outbreak then take  500 mg once daily until clear     oxyCODONE (OXY IR/ROXICODONE) 5 MG immediate release tablet Take 1 tablet (5 mg total) by mouth every 4 (four) hours as needed for moderate pain. (Patient not taking: Reported on 09/27/2018) 12 tablet 0   No current facility-administered medications for this encounter.      Physical Findings:  height is 5' 10"  (1.778 m) and weight is 208 lb 6.4 oz (94.5 kg). Her oral temperature is 98 F (36.7 C). Her blood pressure is 113/79 and her pulse is 86. Her respiration is 20 and oxygen saturation is 100%. .     General: Alert and oriented, in no acute distress Ext: no edema Psychiatric: Judgment and insight are intact. Affect is appropriate. Breast exam reveals excellent healing over left chest wall/axilla  Lab Findings: Lab Results  Component Value Date   WBC 2.4 (L) 08/11/2018   HGB 11.4 (L) 08/11/2018   HCT 34.2 (L) 08/11/2018   MCV 107.9 (H) 08/11/2018   PLT 162 08/11/2018    Radiographic Findings: No results found.  Impression/Plan: Left Breast IDC, triple negative  We discussed adjuvant radiotherapy today.  I recommend radiotherapy to the left chest wall and SCV/Axilla/IM nodes for 6.5 weeks in order to reduce locoregional recurrence by 2/3.  The risks, benefits and side effects of this treatment were discussed in detail.  She understands that radiotherapy is associated with skin irritation and fatigue in the acute setting. Late effects can include cosmetic changes and rare injury to internal organs.  She is enthusiastic about proceeding with treatment. A consent form has been signed and placed in her chart.  Simulation to follow this AM.  In a face to face visit lasting over 15 minutes, greater than 50% of the time was spent discussing her condition and treatment, and coordinating the patient's care.   _____________________________________   Eppie Gibson, MD   This document serves as a record of services personally performed by Eppie Gibson, MD. It was created on her behalf by Wilburn Mylar, a trained medical scribe. The creation of this record is based on the scribe's personal observations and the provider's statements to them. This document has been checked and approved by the attending provider.

## 2018-09-27 ENCOUNTER — Ambulatory Visit
Admission: RE | Admit: 2018-09-27 | Discharge: 2018-09-27 | Disposition: A | Discharge status: Home / Self Care | Payer: BC Managed Care – PPO | Source: Ambulatory Visit | Attending: Radiation Oncology | Admitting: Radiation Oncology

## 2018-09-27 ENCOUNTER — Encounter: Payer: Self-pay | Admitting: Radiation Oncology

## 2018-09-27 ENCOUNTER — Other Ambulatory Visit: Payer: Self-pay

## 2018-09-27 DIAGNOSIS — Z510 Encounter for antineoplastic radiation therapy: Secondary | ICD-10-CM | POA: Insufficient documentation

## 2018-09-27 DIAGNOSIS — Z79899 Other long term (current) drug therapy: Secondary | ICD-10-CM | POA: Diagnosis not present

## 2018-09-27 DIAGNOSIS — C50412 Malignant neoplasm of upper-outer quadrant of left female breast: Secondary | ICD-10-CM | POA: Diagnosis not present

## 2018-09-27 DIAGNOSIS — Z171 Estrogen receptor negative status [ER-]: Secondary | ICD-10-CM | POA: Diagnosis not present

## 2018-09-27 DIAGNOSIS — C773 Secondary and unspecified malignant neoplasm of axilla and upper limb lymph nodes: Secondary | ICD-10-CM | POA: Diagnosis not present

## 2018-09-27 DIAGNOSIS — R918 Other nonspecific abnormal finding of lung field: Secondary | ICD-10-CM | POA: Insufficient documentation

## 2018-09-27 NOTE — Patient Instructions (Signed)
Coronavirus (COVID-19) Are you at risk?  Are you at risk for the Coronavirus (COVID-19)?  To be considered HIGH RISK for Coronavirus (COVID-19), you have to meet the following criteria:  . Traveled to [place], [place], [place], [place] or [place]; or in the [place] to [place], [place], [place], or [place]; and have fever, cough, and shortness of breath within the last 2 weeks of travel OR . Been in close contact with a person diagnosed with COVID-19 within the last 2 weeks and have fever, cough, and shortness of breath . IF YOU DO NOT MEET THESE CRITERIA, YOU ARE CONSIDERED LOW RISK FOR COVID-19.  What to do if you are HIGH RISK for COVID-19?  . If you are having a medical emergency, call 911. . Seek medical care right away. Before you go to a doctor's office, urgent care or emergency department, call ahead and tell them about your recent travel, contact with someone diagnosed with COVID-19, and your symptoms. You should receive instructions from your physician's office regarding next steps of care.  . When you arrive at healthcare provider, tell the healthcare staff immediately you have returned from visiting [place], [place], [place], [place] or [place]; or traveled in the [place] to [place], [place], [place], or [place]; in the last two weeks or you have been in close contact with a person diagnosed with COVID-19 in the last 2 weeks.   . Tell the health care staff about your symptoms: fever, cough and shortness of breath. . After you have been seen by a medical provider, you will be either: o Tested for (COVID-19) and discharged home on quarantine except to seek medical care if symptoms worsen, and asked to  - Stay home and avoid contact with others until you get your results (4-5 days)  - Avoid travel on public transportation if possible (such as bus, train, or airplane) or o Sent to the Emergency Department by EMS for evaluation, COVID-19 testing, and possible  admission depending on your condition and test results.  What to do if you are LOW RISK for COVID-19?  Reduce your risk of any infection by using the same precautions used for avoiding the common cold or flu:  . Wash your hands often with soap and warm water for at least 20 seconds.  If soap and water are not readily available, use an alcohol-based hand sanitizer with at least 60% alcohol.  . If coughing or sneezing, cover your mouth and nose by coughing or sneezing into the elbow areas of your shirt or coat, into a tissue or into your sleeve (not your hands). . Avoid shaking hands with others and consider head nods or verbal greetings only. . Avoid touching your eyes, nose, or mouth with unwashed hands.  . Avoid close contact with people who are sick. . Avoid places or events with large numbers of people in one location, like concerts or sporting events. . Carefully consider travel plans you have or are making. . If you are planning any travel outside or inside the [place], visit the CDC's Travelers' Health webpage for the latest health notices. . If you have some symptoms but not all symptoms, continue to monitor at home and seek medical attention if your symptoms worsen. . If you are having a medical emergency, call 911.   ADDITIONAL HEALTHCARE OPTIONS FOR PATIENTS  [facility] / e-Visit: [email or web address]         [facility]: [phone]  [facility]: [phone]                   [facility]: [phone]   

## 2018-09-27 NOTE — Progress Notes (Signed)
Radiation Oncology         [phone]) [phone] ________________________________  Name: Paula Berry MRN: [id number]  Date: 09/27/2018  DOB: 1970/08/27  SIMULATION AND TREATMENT PLANNING NOTE   Special treatment procedure   Outpatient  DIAGNOSIS:     ICD-10-CM   1. Malignant neoplasm of upper-outer quadrant of left breast in female, estrogen receptor negative ([place]) C50.412    Z17.1     NARRATIVE:  The patient was brought to the [facility]. Identity was confirmed.  All relevant records and images related to the planned course of therapy were reviewed.  The patient freely provided informed written consent to proceed with treatment after reviewing the details related to the planned course of therapy. The consent form was witnessed and verified by the simulation staff.    Then, the patient was set-up in a stable reproducible supine position for radiation therapy with her ipsilateral arm over her head, and her upper body secured in a custom-made Vac-lok device.  CT images were obtained.  Surface markings were placed.  The CT images were loaded into the planning software.    Special treatment procedure:  Special treatment procedure was performed today due to the extra time and effort required by myself to plan and prepare this patient for deep inspiration breath hold technique.  I have determined cardiac sparing to be of benefit to this patient to prevent long term cardiac damage due to radiation of the heart.  Bellows were placed on the patient's abdomen. To facilitate cardiac sparing, the patient was coached by the radiation therapists on breath hold techniques and breathing practice was performed. Practice waveforms were obtained. The patient was then scanned while maintaining breath hold in the treatment position.  This image was then transferred over to the imaging specialist. The imaging specialist then created a fusion of the free breathing and breath hold scans  using the chest wall as the stable structure. I personally reviewed the fusion in axial, coronal and sagittal image planes.  Excellent cardiac sparing was obtained.  I felt the patient is an appropriate candidate for breath hold and the patient will be treated as such.  The image fusion was then reviewed with the patient to reinforce the necessity of reproducible breath hold.  TREATMENT PLANNING NOTE: Treatment planning then occurred.  The radiation prescription was entered and confirmed.     A total of 5 medically necessary complex treatment devices were fabricated and supervised by me: 4 fields with MLCs for custom blocks to protect heart, and lungs;  and, a Vac-lok. MORE COMPLEX DEVICES MAY BE MADE IN DOSIMETRY FOR FIELD IN FIELD BEAMS FOR DOSE HOMOGENEITY.  I have requested : 3D Simulation which is medically necessary to give adequate dose to at risk tissues while sparing lungs and heart.  I have requested a DVH of the following structures: lungs, heart, cord, esophagus.    The patient will receive 50.4 Gy in 28 fractions to the left chest wall and regional nocdes with 4 fields.  This will be followed by a boost.  Optical Surface Tracking Plan:  Since intensity modulated radiotherapy (IMRT) and 3D conformal radiation treatment methods are predicated on accurate and precise positioning for treatment, intrafraction motion monitoring is medically necessary to ensure accurate and safe treatment delivery. The ability to quantify intrafraction motion without excessive ionizing radiation dose can only be performed with optical surface tracking. Accordingly, surface imaging offers the opportunity to obtain 3D measurements of patient position throughout IMRT and 3D  treatments without excessive radiation exposure. I am ordering optical surface tracking for this patient's upcoming course of radiotherapy.  ________________________________   Reference:  Ursula Alert, J, et al. Surface  imaging-based analysis of intrafraction motion for breast radiotherapy patients.Journal of [place], n. 6, nov. 2014. ISSN [id number].  Available at: <http://www.jacmp.org/index.php/jacmp/article/view/4957>.    -----------------------------------  Eppie Gibson, MD

## 2018-09-28 ENCOUNTER — Encounter: Payer: Self-pay | Admitting: Radiation Oncology

## 2018-09-28 DIAGNOSIS — Z510 Encounter for antineoplastic radiation therapy: Secondary | ICD-10-CM | POA: Diagnosis not present

## 2018-09-28 NOTE — Addendum Note (Signed)
Encounter addended by: Eppie Gibson, MD on: 09/28/2018 7:49 PM  Actions taken: Clinical Note Signed

## 2018-09-29 ENCOUNTER — Encounter: Payer: Self-pay | Admitting: *Deleted

## 2018-10-03 ENCOUNTER — Ambulatory Visit: Payer: BC Managed Care – PPO | Admitting: Oncology

## 2018-10-04 ENCOUNTER — Encounter: Payer: Self-pay | Admitting: *Deleted

## 2018-10-05 ENCOUNTER — Ambulatory Visit
Admission: RE | Admit: 2018-10-05 | Discharge: 2018-10-05 | Disposition: A | Discharge status: Home / Self Care | Payer: BC Managed Care – PPO | Source: Ambulatory Visit | Attending: Radiation Oncology | Admitting: Radiation Oncology

## 2018-10-05 ENCOUNTER — Other Ambulatory Visit: Payer: Self-pay

## 2018-10-05 DIAGNOSIS — C50412 Malignant neoplasm of upper-outer quadrant of left female breast: Secondary | ICD-10-CM | POA: Diagnosis not present

## 2018-10-05 DIAGNOSIS — Z510 Encounter for antineoplastic radiation therapy: Secondary | ICD-10-CM | POA: Insufficient documentation

## 2018-10-05 DIAGNOSIS — Z171 Estrogen receptor negative status [ER-]: Secondary | ICD-10-CM | POA: Insufficient documentation

## 2018-10-06 ENCOUNTER — Ambulatory Visit
Admission: RE | Admit: 2018-10-06 | Discharge: 2018-10-06 | Disposition: A | Discharge status: Home / Self Care | Payer: BC Managed Care – PPO | Source: Ambulatory Visit | Attending: Radiation Oncology | Admitting: Radiation Oncology

## 2018-10-06 ENCOUNTER — Other Ambulatory Visit: Payer: Self-pay

## 2018-10-06 DIAGNOSIS — Z510 Encounter for antineoplastic radiation therapy: Secondary | ICD-10-CM | POA: Diagnosis not present

## 2018-10-06 NOTE — Progress Notes (Signed)
[name]  [phone] [phone]    ID: Paula Berry DOB: 02-Nov-1970  MR#: [id number]  JSH#:[id number]  Patient Care Team: Vernie Shanks, MD as PCP - General (Family Medicine) Rolm Bookbinder, MD as Consulting Physician (General Surgery) Deagen Krass, Virgie Dad, MD as Consulting Physician (Oncology) Eppie Gibson, MD as Attending Physician (Radiation Oncology) Everlene Farrier, MD as Consulting Physician (Obstetrics and Gynecology) OTHER MD:   CHIEF COMPLAINT: Triple negative breast cancer  CURRENT TREATMENT: adjuvant radiation    HISTORY OF CURRENT ILLNESS: From the original intake note:  The patient had a left mammogram with tomography and ultrasonography 07/12/2015 at the [facility] after screening recall for a possible left breast mass.  This found the breast density to be category C.  There was a small lobulated mass in the lower outer aspect of the left breast, which was not palpable.  Ultrasound showed a small lobulated cyst in that area measuring 0.6 cm.  This was felt to be benign.  In July 2019 she had screening mammography at Dr. Clementeen Hoof office.  I do not have that report but according to the patient it was unremarkable.  In October 2019 however the patient developed left axillary swelling and tenderness and was again set up for left mammography with tomography and left breast ultrasonography at the [facility], 03/11/2018.  Breast density was category C.  Mammography showed numerous markedly enlarged left axillary lymph nodes.  There was periareolar skin thickening.  There was an area of ill-defined distortion in the upper outer left breast and on physical exam there was a firm lump superior and lateral to the left nipple, with swelling of the left axilla.  Ultrasound of the left breast and both upper quadrants found an ill-defined irregular hypoechoic area measuring at least 4.2 cm.  There also multiple scattered cysts.   Ultrasound of the left axilla showed at least 6 abnormal lymph nodes.  On 03/16/2018 the patient underwent right mammography with tomography and right breast ultrasonography at the [facility].  Again breast density was category C.  There were multiple circumscribed equal density masses in the upper outer quadrant of the right breast which appeared stable.  Ultrasound showed diffuse fibrocystic changes.  An additional hypoechoic mass was noted at the 9 o'clock position 3 cm from the nipple measuring 0.8 cm, with no associated vascularity.  A complex solid and cystic mass was noted superficially at the 6 o'clock position 3 cm from the nipple measuring 1.6 cm.  Evaluation of the right axilla was negative.   On 03/18/2018 the patient underwent biopsy of the 2 suspicious areas in the right breast, and they both showed only fibrocystic changes with some chronic inflammation ([id number]).  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  The patient's subsequent history is as detailed below.   INTERVAL HISTORY: Paula Berry was seen today for follow-up and treatment of her triple negative breast cancer.   She continues on adjuvant radiation, which she started yesterday, 10/06/2018. She has not felt any effects so far.   Since her last visit here, she underwent a left mastectomy on 08/30/2018. The pathology from this procedure showed ([id number]): 1. Breast, simple mastectomy, left - invasive ductal carcinoma, residual, status post neoadjuvant treatment. - margins of resection are not involved (closest margin:  4 cm, deep). - biopsy site changes. - see oncology table. 2. Lymph nodes, regional resection, left axillary   - twelve lymph nodes, negative for carcinoma (0/12).   REVIEW OF  SYSTEMS: Kissy notes stiffness with joint pain. If she sits or lays down for longer than an hour or so, she gets extremely achy. She has been having drenching night sweats recently that are keeping her awake at night; she notes waking up feeling unrested. She was prescribed a medication to help. And she will have a hormone panel completed because she had a hysterectomy. For exercise, she has been walking about two miles per day, as well as yoga.   From her surgery, she did go to physical therapy once, and she has been actively completing her stretches three times per day. She is going to get customized breast prosthesis following radiation. She is worried about cancer developing in her right.   As part of COVID-19 concerns, she has been working from home. She limits her exposure by avoiding going out, and she only goes out when necessary.   The patient denies unusual headaches, visual changes, nausea, vomiting, or dizziness. There has been no unusual cough, phlegm production, or pleurisy. This been no change in bowel or bladder habits. The patient denies unexplained weight loss, bleeding, rash, or fever. A detailed review of systems was otherwise noncontributory.    PAST MEDICAL HISTORY: Past Medical History:  Diagnosis Date   Anemia    had iron infusion 08/12/15   Asthma    Breast cancer ([place]) 03/2018   Cancer ([place])    Breast cancer    Genital herpes    GERD (gastroesophageal reflux disease)    Headache    migraines 2-3 times monthly   Pneumonia    hospitalizied for pneumonia as a child    PAST SURGICAL HISTORY: Past Surgical History:  Procedure Laterality Date   ABDOMINAL HYSTERECTOMY Bilateral 08/22/2015   Procedure: HYSTERECTOMY ABDOMINAL, BILATERAL SALPINGECTOMY;  Surgeon: Everlene Farrier, MD;  Location: [facility];  Service: Gynecology;  Laterality: Bilateral;   MASTECTOMY WITH AXILLARY LYMPH NODE DISSECTION Left 08/30/2018   Procedure: Left mastectomy with axillary lymph  node dissection;  Surgeon: Rolm Bookbinder, MD;  Location: [facility];  Service: General;  Laterality: Left;   MYOMECTOMY ABDOMINAL APPROACH     PORTACATH PLACEMENT N/A 03/30/2018   Procedure: INSERTION PORT-A-CATH WITH [place];  Surgeon: Rolm Bookbinder, MD;  Location: [facility];  Service: General;  Laterality: N/A;    FAMILY HISTORY: Family History  Problem Relation Age of Onset   Hypertension Other    Diabetes Cousin        mat first cousin   Cancer Other    Dementia Maternal Grandfather    Lung cancer Paternal Grandmother        d. 40-60   Brain cancer Paternal Aunt        d. 6, father's mat 1/2 sister   Diabetes Maternal Grandmother    Other Paternal Grandfather        d. WWII   Aneurysm Maternal Uncle        brain   Migraines Neg Hx    The patient's parents are in their early 15s as of October 2019.  A paternal aunt had brain cancer at age 79.  A paternal grandmother had lung cancer at age 60.  There is no breast or ovarian cancer history in the family.  However note that the patient has little information on her mother side of  the family.   GYNECOLOGIC HISTORY:  No LMP recorded. Patient has had a hysterectomy. 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  Her husband [name]. works for the [employer].  He is a former patient here, with stage IV colon cancer.  They live alone, with no pets.     ADVANCED DIRECTIVES: Not in place   HEALTH MAINTENANCE: Social History   Tobacco Use   Smoking status: Never Smoker   Smokeless tobacco: Never Used  Substance Use Topics   Alcohol use: Yes    Alcohol/week: 1.0 standard drinks    Types: 1 Glasses of wine per week     Comment: daily   Drug use: No     Colonoscopy: Never  PAP: Status post hysterectomy  Bone density: Never   Allergies  Allergen Reactions   Neosporin [Neomycin-Bacitracin Zn-Polymyx] Other (See Comments)    Decreased wound healing with blisters    Current Outpatient Medications  Medication Sig Dispense Refill   acetaminophen (TYLENOL) 500 MG tablet Take 1,000 mg by mouth every 8 (eight) hours as needed for moderate pain.     acyclovir ointment (ZOVIRAX) 5 % Apply 1 application topically 4 (four) times daily as needed (outbreaks).   2   albuterol (PROVENTIL HFA;VENTOLIN HFA) 108 (90 Base) MCG/ACT inhaler Inhale 2 puffs into the lungs every 6 (six) hours as needed for wheezing or shortness of breath.      gabapentin (NEURONTIN) 300 MG capsule Take 1 capsule (300 mg total) by mouth at bedtime. 90 capsule 4   methocarbamol (ROBAXIN) 500 MG tablet Take 1 tablet (500 mg total) by mouth 3 (three) times daily. 20 tablet 1   montelukast (SINGULAIR) 10 MG tablet Take 10 mg by mouth every evening.     oxyCODONE (OXY IR/ROXICODONE) 5 MG immediate release tablet Take 1 tablet (5 mg total) by mouth every 4 (four) hours as needed for moderate pain. (Patient not taking: Reported on 09/27/2018) 12 tablet 0   pantoprazole (PROTONIX) 40 MG tablet Take 40 mg by mouth every evening.     SYMBICORT 160-4.5 MCG/ACT inhaler INL 2 PFS PO BID  3   valACYclovir (VALTREX) 500 MG tablet Take 500 mg by mouth See admin instructions. Take 500 mg twice daily for 3 days at onset of outbreak then take 500 mg once daily until clear     No current facility-administered medications for this visit.     OBJECTIVE: Young appearing African-American woman in no acute distress Vitals:   10/07/18 1045  BP: 101/67  Pulse: 79  Resp: 18  Temp: 98.3 F (36.8 C)  SpO2: 100%     Body mass index is 30.68 kg/m.   Wt Readings from Last 3 Encounters:  10/07/18 213 lb 12.8 oz (97 kg)  09/27/18 208 lb 6.4 oz (94.5 kg)    08/30/18 211 lb 10.3 oz (96 kg)  ECOG FS: 1 - Symptomatic but completely ambulatory  Sclerae unicteric, EOMs intact Wearing a mask No cervical or supraclavicular adenopathy Lungs no rales or rhonchi Heart regular rate and rhythm Abd soft, nontender, positive bowel sounds MSK no focal spinal tenderness, no upper extremity lymphedema Neuro: nonfocal, well oriented, appropriate affect Breasts: The right breast is unremarkable.  The left  breast is status post mastectomy.  The incision is healing nicely without unusual erythema swelling or dehiscence.  Both axillae are benign.  LAB RESULTS:  CMP     Component Value Date/Time   NA 141 10/07/2018 1007   K 4.3 10/07/2018 1007   CL 104 10/07/2018 1007   CO2 29 10/07/2018 1007   GLUCOSE 90 10/07/2018 1007   BUN 11 10/07/2018 1007   CREATININE 0.74 10/07/2018 1007   CREATININE 0.67 08/11/2018 0900   CALCIUM 9.7 10/07/2018 1007   PROT 7.3 10/07/2018 1007   ALBUMIN 4.0 10/07/2018 1007   AST 11 (L) 10/07/2018 1007   AST 13 (L) 08/11/2018 0900   ALT 9 10/07/2018 1007   ALT 13 08/11/2018 0900   ALKPHOS 67 10/07/2018 1007   BILITOT 0.8 10/07/2018 1007   BILITOT 0.7 08/11/2018 0900   GFRNONAA >60 10/07/2018 1007   GFRNONAA >60 08/11/2018 0900   GFRAA >60 10/07/2018 1007   GFRAA >60 08/11/2018 0900    No results found for: TOTALPROTELP, ALBUMINELP, A1GS, A2GS, BETS, BETA2SER, GAMS, MSPIKE, SPEI  No results found for: Nils Pyle, [facility]  Lab Results  Component Value Date   WBC 4.7 10/07/2018   NEUTROABS 2.3 10/07/2018   HGB 13.5 10/07/2018   HCT 41.4 10/07/2018   MCV 104.3 (H) 10/07/2018   PLT 194 10/07/2018      Chemistry      Component Value Date/Time   NA 141 10/07/2018 1007   K 4.3 10/07/2018 1007   CL 104 10/07/2018 1007   CO2 29 10/07/2018 1007   BUN 11 10/07/2018 1007   CREATININE 0.74 10/07/2018 1007   CREATININE 0.67 08/11/2018 0900      Component Value Date/Time   CALCIUM 9.7 10/07/2018  1007   ALKPHOS 67 10/07/2018 1007   AST 11 (L) 10/07/2018 1007   AST 13 (L) 08/11/2018 0900   ALT 9 10/07/2018 1007   ALT 13 08/11/2018 0900   BILITOT 0.8 10/07/2018 1007   BILITOT 0.7 08/11/2018 0900       No results found for: LABCA2  No components found for: QXIHWT888  No results for input(s): INR in the last 168 hours.  No results found for: LABCA2  No results found for: KCM034  No results found for: JZP915  No results found for: AVW979  Lab Results  Component Value Date   CA2729 213.7 (H) 04/07/2018    No components found for: HGQUANT  No results found for: CEA1 / No results found for: CEA1   No results found for: AFPTUMOR  No results found for: CHROMOGRNA  No results found for: PSA1  Appointment on 10/07/2018  Component Date Value Ref Range Status   Sodium 10/07/2018 141  135 - 145 mmol/L Final   Potassium 10/07/2018 4.3  3.5 - 5.1 mmol/L Final   Chloride 10/07/2018 104  98 - 111 mmol/L Final   CO2 10/07/2018 29  22 - 32 mmol/L Final   Glucose, Bld 10/07/2018 90  70 - 99 mg/dL Final   BUN 10/07/2018 11  6 - 20 mg/dL Final   Creatinine, Ser 10/07/2018 0.74  0.44 - 1.00 mg/dL Final   Calcium 10/07/2018 9.7  8.9 - 10.3 mg/dL Final   Total Protein 10/07/2018 7.3  6.5 - 8.1 g/dL Final   Albumin 10/07/2018 4.0  3.5 - 5.0 g/dL Final   AST 10/07/2018 11* 15 - 41 U/L Final   ALT 10/07/2018 9  0 - 44 U/L Final   Alkaline Phosphatase 10/07/2018 67  38 - 126 U/L Final   Total Bilirubin 10/07/2018 0.8  0.3 - 1.2 mg/dL Final   GFR calc non Af Amer 10/07/2018 >60  >60 mL/min Final   GFR calc Af Amer 10/07/2018 >60  >60 mL/min Final   Anion gap 10/07/2018 8  5 - 15 Final   Performed at [facility], [place] [address] [place], [place] [address]   WBC 10/07/2018 4.7  4.0 - 10.5 K/uL Final   RBC 10/07/2018 3.97  3.87 - 5.11 MIL/uL Final   Hemoglobin 10/07/2018 13.5  12.0 - 15.0 g/dL Final   HCT 10/07/2018 41.4  36.0 - 46.0  % Final   MCV 10/07/2018 104.3* 80.0 - 100.0 fL Final   MCH 10/07/2018 34.0  26.0 - 34.0 pg Final   MCHC 10/07/2018 32.6  30.0 - 36.0 g/dL Final   RDW 10/07/2018 11.8  11.5 - 15.5 % Final   Platelets 10/07/2018 194  150 - 400 K/uL Final   nRBC 10/07/2018 0.0  0.0 - 0.2 % Final   Neutrophils Relative % 10/07/2018 50  % Final   Neutro Abs 10/07/2018 2.3  1.7 - 7.7 K/uL Final   Lymphocytes Relative 10/07/2018 37  % Final   Lymphs Abs 10/07/2018 1.8  0.7 - 4.0 K/uL Final   Monocytes Relative 10/07/2018 10  % Final   Monocytes Absolute 10/07/2018 0.5  0.1 - 1.0 K/uL Final   Eosinophils Relative 10/07/2018 3  % Final   Eosinophils Absolute 10/07/2018 0.1  0.0 - 0.5 K/uL Final   Basophils Relative 10/07/2018 0  % Final   Basophils Absolute 10/07/2018 0.0  0.0 - 0.1 K/uL Final   Immature Granulocytes 10/07/2018 0  % Final   Abs Immature Granulocytes 10/07/2018 0.01  0.00 - 0.07 K/uL Final   Performed at [facility], [place] [address] [place], [place] [address]    (this displays the last labs from the last 3 days)  No results found for: TOTALPROTELP, ALBUMINELP, A1GS, A2GS, BETS, BETA2SER, GAMS, MSPIKE, SPEI (this displays SPEP labs)  No results found for: KPAFRELGTCHN, LAMBDASER, KAPLAMBRATIO (kappa/lambda light chains)  No results found for: HGBA, HGBA2QUANT, HGBFQUANT, HGBSQUAN (Hemoglobinopathy evaluation)   No results found for: LDH  No results found for: IRON, TIBC, IRONPCTSAT (Iron and TIBC)  No results found for: FERRITIN  Urinalysis No results found for: COLORURINE, APPEARANCEUR, LABSPEC, PHURINE, GLUCOSEU, HGBUR, BILIRUBINUR, KETONESUR, PROTEINUR, UROBILINOGEN, NITRITE, LEUKOCYTESUR   STUDIES: No results found.  ELIGIBLE FOR AVAILABLE RESEARCH PROTOCOL:  no   ASSESSMENT: 48 y.o. [place] woman status post left breast overlapping site biopsy and left axillary lymph node biopsy 03/16/2018 for a clinical T2 N3, stage IIIC  invasive ductal carcinoma, triple negative, with an MIB-1 of 15%   (1) Staging studies  (a) CT chest on 04/05/2018: left axillary and chronic left retropectoral adenopathy, supraclavicular adenopathy, left breast skin thickening and mass, 70m left upper lobe pulmonary nodule (repeat ct chest in 3-6 months recommended), bronchiectasis in left lower lobe.  (b) CT neck on 04/05/2018: left supraclavicular adenopathy, ? Abnormality in left frontal lobe of brain  (c) Bone scan on 04/05/2018: no findings suggestive of metastatic disease to bone  (d) MRI brain 04/07/2018: negative  (2) neoadjuvant chemotherapy consisting of doxorubicin and cyclophosphamide in dose dense fashion x4 started 04/07/2018, completed 05/19/2018,  followed by weekly carboplatin and paclitaxel x10 started 06/02/2018, last dose 08/04/2018 (stopped two cycles early due to peripheral neuropathy)  (3) genetics testing 11/18: no deleterious mutations.  Genes tested include: AIP, ALK, APC, ATM, AXIN2,BAP1,  BARD1, BLM, BMPR1A, BRCA1, BRCA2, BRIP1, CASR, CDC73, CDH1, CDK4, CDKN1B, CDKN1C, CDKN2A (p14ARF), CDKN2A (p16INK4a), CEBPA, CHEK2, CTNNA1, DICER1, DIS3L2, EGFR (c.2369C>T, p.Thr790Met variant only), EPCAM (Deletion/duplication testing only), FH, FLCN, GATA2, GPC3, GREM1 (Promoter region deletion/duplication testing only), HOXB13 (c.251G>A, p.Gly84Glu), HRAS, KIT, MAX, MEN1, MET, MITF (c.952G>A, p.Glu318Lys variant only), MLH1, MSH2, MSH3, MSH6, MUTYH, NBN, NF1, NF2, NTHL1, PALB2, PDGFRA, PHOX2B, PMS2, POLD1, POLE, POT1, PRKAR1A, PTCH1, PTEN, RAD50, RAD51C, RAD51D, RB1, RECQL4, RET, RUNX1, SDHAF2, SDHA (sequence changes only), SDHB, SDHC, SDHD, SMAD4, SMARCA4, SMARCB1, SMARCE1, STK11, SUFU, TERC, TERT, TMEM127, TP53, TSC1, TSC2, VHL, WRN and WT1.  (4) left mastectomy and axillary lymph node dissection 08/30/2018 showed a residual ypT1a ypN0 invasive ductal carcinoma, grade 2, with negative margins.  (a) 12 axillary nodes were removed  (b)  repeat prognostic profile again triple negative, with an MIB-1 of 30%  (5) adjuvant radiation to be completed 11/22/2018  (6) Caris panel requested 10/07/2018   PLAN: Aslee did well with her surgery and it is favorable that all her lymph nodes were negative.  There was minimal residual disease, 4 mm.  She is now undergoing radiation so far with good tolerance  She does not qualify for S14 18.  If her tumor is PDL 1 positive we might be able to obtain checkpoint inhibitors for her and I am requesting that study to be sent today.  She had nonspecific findings on the chest CT scan at baseline which need to be repeated.  She will have a repeat CT of the chest in September.  She is having more hot flashes particularly at night.  I am adding gabapentin 300 mg up to her medications to take at bedtime only.  That is generally very effective.  She is feeling achy all over.  This is a temporary effect from the chemotherapy she has received.  It will improve with exercise, particularly stretching, walking, and yoga.  It should clear after a few months  She is not interested in reconstruction.  She is concerned about the risk of breast cancer in the contralateral breast which is approximately half a percent per year.  We will discuss whether she wishes to receive in addition to yearly mammography and a yearly breast MRI for more intensified follow-up  She knows to call for any other issue that may develop before her next visit here.   Dariyah Garduno, Virgie Dad, MD  10/07/18 11:22 AM Medical Oncology and Hematology [facility] [address] [place], [place] [address] Tel. [phone]    Fax. [phone]  I, Jacqualyn Posey am acting as a Education administrator for Chauncey Cruel, MD.   I, Lurline Del MD, have reviewed the above documentation for accuracy and completeness, and I agree with the above.

## 2018-10-07 ENCOUNTER — Telehealth: Payer: Self-pay | Admitting: *Deleted

## 2018-10-07 ENCOUNTER — Inpatient Hospital Stay (HOSPITAL_BASED_OUTPATIENT_CLINIC_OR_DEPARTMENT_OTHER): Payer: BC Managed Care – PPO | Admitting: Oncology

## 2018-10-07 ENCOUNTER — Inpatient Hospital Stay: Payer: BC Managed Care – PPO

## 2018-10-07 ENCOUNTER — Other Ambulatory Visit: Payer: Self-pay

## 2018-10-07 ENCOUNTER — Ambulatory Visit
Admission: RE | Admit: 2018-10-07 | Discharge: 2018-10-07 | Disposition: A | Discharge status: Home / Self Care | Payer: BC Managed Care – PPO | Source: Ambulatory Visit | Attending: Radiation Oncology | Admitting: Radiation Oncology

## 2018-10-07 VITALS — BP 101/67 | HR 79 | Temp 98.3°F | Resp 18 | Ht 70.0 in | Wt 213.8 lb

## 2018-10-07 DIAGNOSIS — Z820 Family history of epilepsy and other diseases of the nervous system: Secondary | ICD-10-CM | POA: Insufficient documentation

## 2018-10-07 DIAGNOSIS — Z8249 Family history of ischemic heart disease and other diseases of the circulatory system: Secondary | ICD-10-CM | POA: Insufficient documentation

## 2018-10-07 DIAGNOSIS — Z808 Family history of malignant neoplasm of other organs or systems: Secondary | ICD-10-CM

## 2018-10-07 DIAGNOSIS — Z79899 Other long term (current) drug therapy: Secondary | ICD-10-CM

## 2018-10-07 DIAGNOSIS — R234 Changes in skin texture: Secondary | ICD-10-CM

## 2018-10-07 DIAGNOSIS — G629 Polyneuropathy, unspecified: Secondary | ICD-10-CM | POA: Insufficient documentation

## 2018-10-07 DIAGNOSIS — Z7289 Other problems related to lifestyle: Secondary | ICD-10-CM

## 2018-10-07 DIAGNOSIS — R232 Flushing: Secondary | ICD-10-CM | POA: Insufficient documentation

## 2018-10-07 DIAGNOSIS — Z7951 Long term (current) use of inhaled steroids: Secondary | ICD-10-CM

## 2018-10-07 DIAGNOSIS — Z171 Estrogen receptor negative status [ER-]: Secondary | ICD-10-CM

## 2018-10-07 DIAGNOSIS — Z809 Family history of malignant neoplasm, unspecified: Secondary | ICD-10-CM | POA: Insufficient documentation

## 2018-10-07 DIAGNOSIS — J45909 Unspecified asthma, uncomplicated: Secondary | ICD-10-CM

## 2018-10-07 DIAGNOSIS — R911 Solitary pulmonary nodule: Secondary | ICD-10-CM

## 2018-10-07 DIAGNOSIS — Z801 Family history of malignant neoplasm of trachea, bronchus and lung: Secondary | ICD-10-CM | POA: Insufficient documentation

## 2018-10-07 DIAGNOSIS — Z9012 Acquired absence of left breast and nipple: Secondary | ICD-10-CM | POA: Insufficient documentation

## 2018-10-07 DIAGNOSIS — C50412 Malignant neoplasm of upper-outer quadrant of left female breast: Secondary | ICD-10-CM

## 2018-10-07 DIAGNOSIS — Z90710 Acquired absence of both cervix and uterus: Secondary | ICD-10-CM | POA: Insufficient documentation

## 2018-10-07 DIAGNOSIS — R61 Generalized hyperhidrosis: Secondary | ICD-10-CM

## 2018-10-07 DIAGNOSIS — Z833 Family history of diabetes mellitus: Secondary | ICD-10-CM

## 2018-10-07 DIAGNOSIS — Z90712 Acquired absence of cervix with remaining uterus: Secondary | ICD-10-CM

## 2018-10-07 DIAGNOSIS — M2550 Pain in unspecified joint: Secondary | ICD-10-CM | POA: Insufficient documentation

## 2018-10-07 DIAGNOSIS — Z510 Encounter for antineoplastic radiation therapy: Secondary | ICD-10-CM | POA: Diagnosis not present

## 2018-10-07 LAB — COMPREHENSIVE METABOLIC PANEL
ALT: 9 U/L (ref 0–44)
AST: 11 U/L — ABNORMAL LOW (ref 15–41)
Albumin: 4 g/dL (ref 3.5–5.0)
Alkaline Phosphatase: 67 U/L (ref 38–126)
Anion gap: 8 (ref 5–15)
BUN: 11 mg/dL (ref 6–20)
CO2: 29 mmol/L (ref 22–32)
Calcium: 9.7 mg/dL (ref 8.9–10.3)
Chloride: 104 mmol/L (ref 98–111)
Creatinine, Ser: 0.74 mg/dL (ref 0.44–1.00)
GFR calc Af Amer: >60 mL/min (ref >60)
GFR calc non Af Amer: >60 mL/min (ref >60)
Glucose, Bld: 90 mg/dL (ref 70–99)
Potassium: 4.3 mmol/L (ref 3.5–5.1)
Sodium: 141 mmol/L (ref 135–145)
Total Bilirubin: 0.8 mg/dL (ref 0.3–1.2)
Total Protein: 7.3 g/dL (ref 6.5–8.1)

## 2018-10-07 LAB — CBC WITH DIFFERENTIAL/PLATELET
Abs Immature Granulocytes: 0.01 10*3/uL (ref 0.00–0.07)
Basophils Absolute: 0 10*3/uL (ref 0.0–0.1)
Basophils Relative: 0 %
Eosinophils Absolute: 0.1 10*3/uL (ref 0.0–0.5)
Eosinophils Relative: 3 %
HCT: 41.4 % (ref 36.0–46.0)
Hemoglobin: 13.5 g/dL (ref 12.0–15.0)
Immature Granulocytes: 0 %
Lymphocytes Relative: 37 %
Lymphs Abs: 1.8 10*3/uL (ref 0.7–4.0)
MCH: 34 pg (ref 26.0–34.0)
MCHC: 32.6 g/dL (ref 30.0–36.0)
MCV: 104.3 fL — ABNORMAL HIGH (ref 80.0–100.0)
Monocytes Absolute: 0.5 10*3/uL (ref 0.1–1.0)
Monocytes Relative: 10 %
Neutro Abs: 2.3 10*3/uL (ref 1.7–7.7)
Neutrophils Relative %: 50 %
Platelets: 194 10*3/uL (ref 150–400)
RBC: 3.97 MIL/uL (ref 3.87–5.11)
RDW: 11.8 % (ref 11.5–15.5)
WBC: 4.7 10*3/uL (ref 4.0–10.5)
nRBC: 0 % (ref 0.0–0.2)

## 2018-10-07 MED ORDER — GABAPENTIN 300 MG PO CAPS: 300.0000 mg | ORAL_CAPSULE | Freq: Every day | ORAL | 4 refills | Status: DC

## 2018-10-07 MED ORDER — RADIAPLEXRX EX GEL
Freq: Once | CUTANEOUS | Status: AC
  Administered 2018-10-07: 14:00:00 via TOPICAL

## 2018-10-07 MED ORDER — ALRA NON-METALLIC DEODORANT (RAD-ONC)
1.0000 "application " | Freq: Once | TOPICAL | Status: AC
  Administered 2018-10-07: 1 via TOPICAL

## 2018-10-07 NOTE — Progress Notes (Signed)
Pt here for patient teaching.  Pt given Radiation and You booklet, skin care instructions, Alra deodorant and Radiaplex gel.  Reviewed areas of pertinence such as fatigue, hair loss, skin changes, breast tenderness and breast swelling . Pt able to give teach back of to pat skin, use unscented/gentle soap and drink plenty of water,apply Radiaplex bid, avoid applying anything to skin within 4 hours of treatment, avoid wearing an under wire bra and to use an electric razor if they must shave. Pt verbalizes understanding of information given and will contact nursing with any questions or concerns.     [email or web address]

## 2018-10-07 NOTE — Telephone Encounter (Signed)
Caris request faxed with appropriate data.

## 2018-10-10 ENCOUNTER — Ambulatory Visit
Admission: RE | Admit: 2018-10-10 | Discharge: 2018-10-10 | Disposition: A | Discharge status: Home / Self Care | Payer: BC Managed Care – PPO | Source: Ambulatory Visit | Attending: Radiation Oncology | Admitting: Radiation Oncology

## 2018-10-10 ENCOUNTER — Encounter: Payer: Self-pay | Admitting: *Deleted

## 2018-10-10 ENCOUNTER — Other Ambulatory Visit: Payer: Self-pay

## 2018-10-10 DIAGNOSIS — Z510 Encounter for antineoplastic radiation therapy: Secondary | ICD-10-CM | POA: Diagnosis not present

## 2018-10-11 ENCOUNTER — Ambulatory Visit
Admission: RE | Admit: 2018-10-11 | Discharge: 2018-10-11 | Disposition: A | Discharge status: Home / Self Care | Payer: BC Managed Care – PPO | Source: Ambulatory Visit | Attending: Radiation Oncology | Admitting: Radiation Oncology

## 2018-10-11 DIAGNOSIS — Z510 Encounter for antineoplastic radiation therapy: Secondary | ICD-10-CM | POA: Diagnosis not present

## 2018-10-12 ENCOUNTER — Other Ambulatory Visit: Payer: Self-pay

## 2018-10-12 ENCOUNTER — Ambulatory Visit
Admission: RE | Admit: 2018-10-12 | Discharge: 2018-10-12 | Disposition: A | Discharge status: Home / Self Care | Payer: BC Managed Care – PPO | Source: Ambulatory Visit | Attending: Radiation Oncology | Admitting: Radiation Oncology

## 2018-10-12 DIAGNOSIS — Z510 Encounter for antineoplastic radiation therapy: Secondary | ICD-10-CM | POA: Diagnosis not present

## 2018-10-13 ENCOUNTER — Ambulatory Visit
Admission: RE | Admit: 2018-10-13 | Discharge: 2018-10-13 | Disposition: A | Discharge status: Home / Self Care | Payer: BC Managed Care – PPO | Source: Ambulatory Visit | Attending: Radiation Oncology | Admitting: Radiation Oncology

## 2018-10-13 ENCOUNTER — Other Ambulatory Visit: Payer: Self-pay

## 2018-10-13 DIAGNOSIS — Z510 Encounter for antineoplastic radiation therapy: Secondary | ICD-10-CM | POA: Diagnosis not present

## 2018-10-14 ENCOUNTER — Other Ambulatory Visit: Payer: Self-pay

## 2018-10-14 ENCOUNTER — Ambulatory Visit
Admission: RE | Admit: 2018-10-14 | Discharge: 2018-10-14 | Disposition: A | Discharge status: Home / Self Care | Payer: BC Managed Care – PPO | Source: Ambulatory Visit | Attending: Radiation Oncology | Admitting: Radiation Oncology

## 2018-10-14 DIAGNOSIS — Z510 Encounter for antineoplastic radiation therapy: Secondary | ICD-10-CM | POA: Diagnosis not present

## 2018-10-17 ENCOUNTER — Other Ambulatory Visit: Payer: Self-pay

## 2018-10-17 ENCOUNTER — Ambulatory Visit
Admission: RE | Admit: 2018-10-17 | Discharge: 2018-10-17 | Disposition: A | Discharge status: Home / Self Care | Payer: BC Managed Care – PPO | Source: Ambulatory Visit | Attending: Radiation Oncology | Admitting: Radiation Oncology

## 2018-10-17 DIAGNOSIS — Z510 Encounter for antineoplastic radiation therapy: Secondary | ICD-10-CM | POA: Diagnosis not present

## 2018-10-18 ENCOUNTER — Other Ambulatory Visit: Payer: Self-pay

## 2018-10-18 ENCOUNTER — Ambulatory Visit
Admission: RE | Admit: 2018-10-18 | Discharge: 2018-10-18 | Disposition: A | Discharge status: Home / Self Care | Payer: BC Managed Care – PPO | Source: Ambulatory Visit | Attending: Radiation Oncology | Admitting: Radiation Oncology

## 2018-10-18 DIAGNOSIS — Z510 Encounter for antineoplastic radiation therapy: Secondary | ICD-10-CM | POA: Diagnosis not present

## 2018-10-19 ENCOUNTER — Ambulatory Visit
Admission: RE | Admit: 2018-10-19 | Discharge: 2018-10-19 | Disposition: A | Discharge status: Home / Self Care | Payer: BC Managed Care – PPO | Source: Ambulatory Visit | Attending: Radiation Oncology | Admitting: Radiation Oncology

## 2018-10-19 ENCOUNTER — Other Ambulatory Visit: Payer: Self-pay

## 2018-10-19 DIAGNOSIS — Z510 Encounter for antineoplastic radiation therapy: Secondary | ICD-10-CM | POA: Diagnosis not present

## 2018-10-20 ENCOUNTER — Other Ambulatory Visit: Payer: Self-pay

## 2018-10-20 ENCOUNTER — Ambulatory Visit
Admission: RE | Admit: 2018-10-20 | Discharge: 2018-10-20 | Disposition: A | Discharge status: Home / Self Care | Payer: BC Managed Care – PPO | Source: Ambulatory Visit | Attending: Radiation Oncology | Admitting: Radiation Oncology

## 2018-10-20 DIAGNOSIS — Z510 Encounter for antineoplastic radiation therapy: Secondary | ICD-10-CM | POA: Diagnosis not present

## 2018-10-21 ENCOUNTER — Ambulatory Visit
Admission: RE | Admit: 2018-10-21 | Discharge: 2018-10-21 | Disposition: A | Discharge status: Home / Self Care | Payer: BC Managed Care – PPO | Source: Ambulatory Visit | Attending: Radiation Oncology | Admitting: Radiation Oncology

## 2018-10-21 ENCOUNTER — Other Ambulatory Visit: Payer: Self-pay

## 2018-10-21 DIAGNOSIS — Z510 Encounter for antineoplastic radiation therapy: Secondary | ICD-10-CM | POA: Diagnosis not present

## 2018-10-25 ENCOUNTER — Other Ambulatory Visit: Payer: Self-pay

## 2018-10-25 ENCOUNTER — Ambulatory Visit
Admission: RE | Admit: 2018-10-25 | Discharge: 2018-10-25 | Disposition: A | Discharge status: Home / Self Care | Payer: BC Managed Care – PPO | Source: Ambulatory Visit | Attending: Radiation Oncology | Admitting: Radiation Oncology

## 2018-10-25 DIAGNOSIS — Z510 Encounter for antineoplastic radiation therapy: Secondary | ICD-10-CM | POA: Diagnosis not present

## 2018-10-26 ENCOUNTER — Other Ambulatory Visit: Payer: Self-pay

## 2018-10-26 ENCOUNTER — Other Ambulatory Visit: Payer: Self-pay | Admitting: Oncology

## 2018-10-26 ENCOUNTER — Ambulatory Visit
Admission: RE | Admit: 2018-10-26 | Discharge: 2018-10-26 | Disposition: A | Discharge status: Home / Self Care | Payer: BC Managed Care – PPO | Source: Ambulatory Visit | Attending: Radiation Oncology | Admitting: Radiation Oncology

## 2018-10-26 ENCOUNTER — Other Ambulatory Visit: Payer: Self-pay | Admitting: *Deleted

## 2018-10-26 DIAGNOSIS — Z510 Encounter for antineoplastic radiation therapy: Secondary | ICD-10-CM | POA: Diagnosis not present

## 2018-10-27 ENCOUNTER — Other Ambulatory Visit: Payer: Self-pay

## 2018-10-27 ENCOUNTER — Ambulatory Visit
Admission: RE | Admit: 2018-10-27 | Discharge: 2018-10-27 | Disposition: A | Discharge status: Home / Self Care | Payer: BC Managed Care – PPO | Source: Ambulatory Visit | Attending: Radiation Oncology | Admitting: Radiation Oncology

## 2018-10-27 DIAGNOSIS — Z510 Encounter for antineoplastic radiation therapy: Secondary | ICD-10-CM | POA: Diagnosis not present

## 2018-10-28 ENCOUNTER — Telehealth: Payer: Self-pay | Admitting: Oncology

## 2018-10-28 ENCOUNTER — Other Ambulatory Visit: Payer: Self-pay

## 2018-10-28 ENCOUNTER — Ambulatory Visit
Admission: RE | Admit: 2018-10-28 | Discharge: 2018-10-28 | Disposition: A | Discharge status: Home / Self Care | Payer: BC Managed Care – PPO | Source: Ambulatory Visit | Attending: Radiation Oncology | Admitting: Radiation Oncology

## 2018-10-28 DIAGNOSIS — Z510 Encounter for antineoplastic radiation therapy: Secondary | ICD-10-CM | POA: Diagnosis not present

## 2018-10-28 NOTE — Telephone Encounter (Signed)
Left message re 6/16 lab/fu. Schedule mailed.

## 2018-10-31 ENCOUNTER — Ambulatory Visit
Admission: RE | Admit: 2018-10-31 | Discharge: 2018-10-31 | Disposition: A | Discharge status: Home / Self Care | Payer: BC Managed Care – PPO | Source: Ambulatory Visit | Attending: Radiation Oncology | Admitting: Radiation Oncology

## 2018-10-31 ENCOUNTER — Other Ambulatory Visit: Payer: Self-pay

## 2018-10-31 DIAGNOSIS — C50412 Malignant neoplasm of upper-outer quadrant of left female breast: Secondary | ICD-10-CM | POA: Insufficient documentation

## 2018-10-31 DIAGNOSIS — Z510 Encounter for antineoplastic radiation therapy: Secondary | ICD-10-CM | POA: Diagnosis present

## 2018-10-31 DIAGNOSIS — Z171 Estrogen receptor negative status [ER-]: Secondary | ICD-10-CM | POA: Diagnosis not present

## 2018-11-01 ENCOUNTER — Other Ambulatory Visit: Payer: Self-pay

## 2018-11-01 ENCOUNTER — Ambulatory Visit
Admission: RE | Admit: 2018-11-01 | Discharge: 2018-11-01 | Disposition: A | Discharge status: Home / Self Care | Payer: BC Managed Care – PPO | Source: Ambulatory Visit | Attending: Radiation Oncology | Admitting: Radiation Oncology

## 2018-11-01 DIAGNOSIS — Z510 Encounter for antineoplastic radiation therapy: Secondary | ICD-10-CM | POA: Diagnosis not present

## 2018-11-02 ENCOUNTER — Ambulatory Visit
Admission: RE | Admit: 2018-11-02 | Discharge: 2018-11-02 | Disposition: A | Discharge status: Home / Self Care | Payer: BC Managed Care – PPO | Source: Ambulatory Visit | Attending: Radiation Oncology | Admitting: Radiation Oncology

## 2018-11-02 ENCOUNTER — Other Ambulatory Visit: Payer: Self-pay

## 2018-11-02 DIAGNOSIS — Z510 Encounter for antineoplastic radiation therapy: Secondary | ICD-10-CM | POA: Diagnosis not present

## 2018-11-03 ENCOUNTER — Other Ambulatory Visit: Payer: Self-pay

## 2018-11-03 ENCOUNTER — Ambulatory Visit
Admission: RE | Admit: 2018-11-03 | Discharge: 2018-11-03 | Disposition: A | Discharge status: Home / Self Care | Payer: BC Managed Care – PPO | Source: Ambulatory Visit | Attending: Radiation Oncology | Admitting: Radiation Oncology

## 2018-11-03 DIAGNOSIS — Z510 Encounter for antineoplastic radiation therapy: Secondary | ICD-10-CM | POA: Diagnosis not present

## 2018-11-04 ENCOUNTER — Ambulatory Visit
Admission: RE | Admit: 2018-11-04 | Discharge: 2018-11-04 | Disposition: A | Discharge status: Home / Self Care | Payer: BC Managed Care – PPO | Source: Ambulatory Visit | Attending: Radiation Oncology | Admitting: Radiation Oncology

## 2018-11-04 ENCOUNTER — Other Ambulatory Visit: Payer: Self-pay

## 2018-11-04 DIAGNOSIS — Z510 Encounter for antineoplastic radiation therapy: Secondary | ICD-10-CM | POA: Diagnosis not present

## 2018-11-07 ENCOUNTER — Ambulatory Visit
Admission: RE | Admit: 2018-11-07 | Discharge: 2018-11-07 | Disposition: A | Discharge status: Home / Self Care | Payer: BC Managed Care – PPO | Source: Ambulatory Visit | Attending: Radiation Oncology | Admitting: Radiation Oncology

## 2018-11-07 ENCOUNTER — Other Ambulatory Visit: Payer: Self-pay

## 2018-11-07 ENCOUNTER — Encounter (HOSPITAL_COMMUNITY): Payer: Self-pay | Admitting: Oncology

## 2018-11-07 DIAGNOSIS — Z510 Encounter for antineoplastic radiation therapy: Secondary | ICD-10-CM | POA: Diagnosis not present

## 2018-11-08 ENCOUNTER — Other Ambulatory Visit: Payer: Self-pay

## 2018-11-08 ENCOUNTER — Ambulatory Visit
Admission: RE | Admit: 2018-11-08 | Discharge: 2018-11-08 | Disposition: A | Discharge status: Home / Self Care | Payer: BC Managed Care – PPO | Source: Ambulatory Visit | Attending: Radiation Oncology | Admitting: Radiation Oncology

## 2018-11-08 DIAGNOSIS — Z510 Encounter for antineoplastic radiation therapy: Secondary | ICD-10-CM | POA: Diagnosis not present

## 2018-11-09 ENCOUNTER — Ambulatory Visit
Admission: RE | Admit: 2018-11-09 | Discharge: 2018-11-09 | Disposition: A | Discharge status: Home / Self Care | Payer: BC Managed Care – PPO | Source: Ambulatory Visit | Attending: Radiation Oncology | Admitting: Radiation Oncology

## 2018-11-09 ENCOUNTER — Other Ambulatory Visit: Payer: Self-pay

## 2018-11-09 DIAGNOSIS — Z510 Encounter for antineoplastic radiation therapy: Secondary | ICD-10-CM | POA: Diagnosis not present

## 2018-11-10 ENCOUNTER — Other Ambulatory Visit: Payer: Self-pay

## 2018-11-10 ENCOUNTER — Ambulatory Visit
Admission: RE | Admit: 2018-11-10 | Discharge: 2018-11-10 | Disposition: A | Discharge status: Home / Self Care | Payer: BC Managed Care – PPO | Source: Ambulatory Visit | Attending: Radiation Oncology | Admitting: Radiation Oncology

## 2018-11-10 DIAGNOSIS — Z510 Encounter for antineoplastic radiation therapy: Secondary | ICD-10-CM | POA: Diagnosis not present

## 2018-11-11 ENCOUNTER — Other Ambulatory Visit: Payer: Self-pay

## 2018-11-11 ENCOUNTER — Ambulatory Visit
Admission: RE | Admit: 2018-11-11 | Discharge: 2018-11-11 | Disposition: A | Discharge status: Home / Self Care | Payer: BC Managed Care – PPO | Source: Ambulatory Visit | Attending: Radiation Oncology | Admitting: Radiation Oncology

## 2018-11-11 DIAGNOSIS — Z510 Encounter for antineoplastic radiation therapy: Secondary | ICD-10-CM | POA: Diagnosis not present

## 2018-11-14 ENCOUNTER — Other Ambulatory Visit: Payer: Self-pay

## 2018-11-14 ENCOUNTER — Ambulatory Visit
Admission: RE | Admit: 2018-11-14 | Discharge: 2018-11-14 | Disposition: A | Discharge status: Home / Self Care | Payer: BC Managed Care – PPO | Source: Ambulatory Visit | Attending: Radiation Oncology | Admitting: Radiation Oncology

## 2018-11-14 DIAGNOSIS — Z171 Estrogen receptor negative status [ER-]: Secondary | ICD-10-CM

## 2018-11-14 DIAGNOSIS — C50412 Malignant neoplasm of upper-outer quadrant of left female breast: Secondary | ICD-10-CM

## 2018-11-14 DIAGNOSIS — Z510 Encounter for antineoplastic radiation therapy: Secondary | ICD-10-CM | POA: Diagnosis not present

## 2018-11-14 MED ORDER — RADIAPLEXRX EX GEL
Freq: Once | CUTANEOUS | Status: AC
  Administered 2018-11-14: 1 via TOPICAL

## 2018-11-14 NOTE — Progress Notes (Signed)
[place]  Telephone:(336) [phone] Fax:(336) [phone]    ID: Paula Berry DOB: 03-04-1971  MR#: [id number]  RAX#:[id number]  Patient Care Team: Vernie Shanks, MD as PCP - General (Family Medicine) Rolm Bookbinder, MD as Consulting Physician (General Surgery) Damonica Chopra, Virgie Dad, MD as Consulting Physician (Oncology) Eppie Gibson, MD as Attending Physician (Radiation Oncology) Everlene Farrier, MD as Consulting Physician (Obstetrics and Gynecology) OTHER MD:   CHIEF COMPLAINT: Triple negative breast cancer  CURRENT TREATMENT: adjuvant radiation    INTERVAL HISTORY: Paula Berry was seen today for follow-up and treatment of her triple negative breast cancer.   She continues on adjuvant radiation, which she will complete on 11/22/2018. She reports skin tightness and darkening, and fatigue. She denies any peeling.  We discussed her Caris report today. The report was completed on 10/31/2018.   REVIEW OF SYSTEMS: Paula Berry reports she is doing what work she can from home. 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 So she will have a bit of a break. She does yoga moves 2-3 times a day. She was supposed to meet with Dr. Iran Planas, but her appointment was cancelled due to the virus concerns. She denies any lymphedema. A detailed review of systems was otherwise entirely negative.   HISTORY OF CURRENT ILLNESS: From the original intake note:  The patient had a left mammogram with tomography and ultrasonography 07/12/2015 at the [facility] after screening recall for a possible left breast mass.  This found the breast density to be category C.  There was a small lobulated mass in the lower outer aspect of the left breast, which was not palpable.  Ultrasound showed a small lobulated cyst in that area measuring 0.6 cm.  This was felt to be benign.  In July 2019 she had screening mammography at Dr. Clementeen Hoof office.  I do not have that report but according  to the patient it was unremarkable.  In October 2019 however the patient developed left axillary swelling and tenderness and was again set up for left mammography with tomography and left breast ultrasonography at the [facility], 03/11/2018.  Breast density was category C.  Mammography showed numerous markedly enlarged left axillary lymph nodes.  There was periareolar skin thickening.  There was an area of ill-defined distortion in the upper outer left breast and on physical exam there was a firm lump superior and lateral to the left nipple, with swelling of the left axilla.  Ultrasound of the left breast and both upper quadrants found an ill-defined irregular hypoechoic area measuring at least 4.2 cm.  There also multiple scattered cysts.  Ultrasound of the left axilla showed at least 6 abnormal lymph nodes.  On 03/16/2018 the patient underwent right mammography with tomography and right breast ultrasonography at the [facility].  Again breast density was category C.  There were multiple circumscribed equal density masses in the upper outer quadrant of the right breast which appeared stable.  Ultrasound showed diffuse fibrocystic changes.  An additional hypoechoic mass was noted at the 9 o'clock position 3 cm from the nipple measuring 0.8 cm, with no associated vascularity.  A complex solid and cystic mass was noted superficially at the 6 o'clock position 3 cm from the nipple measuring 1.6 cm.  Evaluation of the right axilla was negative.   On 03/18/2018 the patient underwent biopsy of the 2 suspicious areas in the right breast, and they both showed only fibrocystic changes with some chronic inflammation ([id number]).  Biopsy  of the left breast at 1:00 and 1 of the left axillary lymph nodes on 03/16/2018 found an invasive ductal carcinoma, E-cadherin positive, grade 3, estrogen and progesterone receptor negative, HER-2 negative by immunohistochemistry (1+), with an MIB-1 of 15%.  The patient's  subsequent history is as detailed below.   PAST MEDICAL HISTORY: Past Medical History:  Diagnosis Date  . Anemia    had iron infusion 08/12/15  . Asthma   . Breast cancer ([place]) 03/2018  . Cancer [facility])    Breast cancer   . Genital herpes   . GERD (gastroesophageal reflux disease)   . Headache    migraines 2-3 times monthly  . Pneumonia    hospitalizied for pneumonia as a child    PAST SURGICAL HISTORY: Past Surgical History:  Procedure Laterality Date  . ABDOMINAL HYSTERECTOMY Bilateral 08/22/2015   Procedure: HYSTERECTOMY ABDOMINAL, BILATERAL SALPINGECTOMY;  Surgeon: Everlene Farrier, MD;  Location: [facility];  Service: Gynecology;  Laterality: Bilateral;  . MASTECTOMY WITH AXILLARY LYMPH NODE DISSECTION Left 08/30/2018   Procedure: Left mastectomy with axillary lymph node dissection;  Surgeon: Rolm Bookbinder, MD;  Location: [place];  Service: General;  Laterality: Left;  . MYOMECTOMY ABDOMINAL APPROACH    . PORTACATH PLACEMENT N/A 03/30/2018   Procedure: INSERTION PORT-A-CATH WITH [place];  Surgeon: Rolm Bookbinder, MD;  Location: [place];  Service: General;  Laterality: N/A;    FAMILY HISTORY: Family History  Problem Relation Age of Onset  . Hypertension Other   . Diabetes Cousin        mat first cousin  . Cancer Other   . Dementia Maternal Grandfather   . Lung cancer Paternal Grandmother        d. 87-60  . Brain cancer Paternal Aunt        d. 53, father's mat 1/2 sister  . Diabetes Maternal Grandmother   . Other Paternal Grandfather        d. WWII  . Aneurysm Maternal Uncle        brain  . Migraines Neg Hx    The patient's parents are in their early 39s as of October 2019.  A paternal aunt had brain cancer at age 18.  A paternal grandmother had lung cancer at age 89.  There is no breast or ovarian cancer history in the family.  However note that the patient has little information on her mother side of the family.   GYNECOLOGIC HISTORY:  No LMP  recorded. Patient has had a hysterectomy. 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  Her husband [name]. works for the [employer].  He is a former patient here, with stage IV colon cancer.  They live alone, with no pets.     ADVANCED DIRECTIVES: Not in place   HEALTH MAINTENANCE: Social History   Tobacco Use  . Smoking status: Never Smoker  . Smokeless tobacco: Never Used  Substance Use Topics  . Alcohol use: Yes    Alcohol/week: 1.0 standard drinks    Types: 1 Glasses of wine per week    Comment: daily  . Drug use: No     Colonoscopy: Never  PAP: Status post hysterectomy  Bone density: Never   Allergies  Allergen Reactions  .  Neosporin [Neomycin-Bacitracin Zn-Polymyx] Other (See Comments)    Decreased wound healing with blisters    Current Outpatient Medications  Medication Sig Dispense Refill  . acetaminophen (TYLENOL) 500 MG tablet Take 1,000 mg by mouth every 8 (eight) hours as needed for moderate pain.    Marland Kitchen acyclovir ointment (ZOVIRAX) 5 % Apply 1 application topically 4 (four) times daily as needed (outbreaks).   2  . albuterol (PROVENTIL HFA;VENTOLIN HFA) 108 (90 Base) MCG/ACT inhaler Inhale 2 puffs into the lungs every 6 (six) hours as needed for wheezing or shortness of breath.     . gabapentin (NEURONTIN) 300 MG capsule Take 1 capsule (300 mg total) by mouth at bedtime. 90 capsule 4  . methocarbamol (ROBAXIN) 500 MG tablet Take 1 tablet (500 mg total) by mouth 3 (three) times daily. 20 tablet 1  . montelukast (SINGULAIR) 10 MG tablet Take 10 mg by mouth every evening.    Marland Kitchen oxyCODONE (OXY IR/ROXICODONE) 5 MG immediate release tablet Take 1 tablet (5 mg total) by mouth every 4 (four) hours  as needed for moderate pain. (Patient not taking: Reported on 09/27/2018) 12 tablet 0  . pantoprazole (PROTONIX) 40 MG tablet Take 40 mg by mouth every evening.    . SYMBICORT 160-4.5 MCG/ACT inhaler INL 2 PFS PO BID  3  . valACYclovir (VALTREX) 500 MG tablet Take 500 mg by mouth See admin instructions. Take 500 mg twice daily for 3 days at onset of outbreak then take 500 mg once daily until clear     No current facility-administered medications for this visit.     OBJECTIVE: Young appearing African-American woman who appears stated age 48:   11/15/18 1443  BP: 113/72  Pulse: 86  Resp: 18  SpO2: 100%     Body mass index is 30.92 kg/m.   Wt Readings from Last 3 Encounters:  11/15/18 215 lb 8 oz (97.8 kg)  10/07/18 213 lb 12.8 oz (97 kg)  09/27/18 208 lb 6.4 oz (94.5 kg)  ECOG FS: 1 - Symptomatic but completely ambulatory  Sclerae unicteric, pupils round and equal No cervical or supraclavicular adenopathy Lungs no rales or rhonchi Heart regular rate and rhythm Abd soft, nontender, positive bowel sounds MSK no focal spinal tenderness, no upper extremity lymphedema Neuro: nonfocal, well oriented, appropriate affect Breasts: The right breast is benign per the left breast is status post mastectomy and currently receiving radiation.  There is significant hyperpigmentation but no desquamation.  Both axillae are benign.   LAB RESULTS:  CMP     Component Value Date/Time   NA 141 10/07/2018 1007   K 4.3 10/07/2018 1007   CL 104 10/07/2018 1007   CO2 29 10/07/2018 1007   GLUCOSE 90 10/07/2018 1007   BUN 11 10/07/2018 1007   CREATININE 0.74 10/07/2018 1007   CREATININE 0.67 08/11/2018 0900   CALCIUM 9.7 10/07/2018 1007   PROT 7.3 10/07/2018 1007   ALBUMIN 4.0 10/07/2018 1007   AST 11 (L) 10/07/2018 1007   AST 13 (L) 08/11/2018 0900   ALT 9 10/07/2018 1007   ALT 13 08/11/2018 0900   ALKPHOS 67 10/07/2018 1007   BILITOT 0.8 10/07/2018 1007   BILITOT 0.7 08/11/2018 0900    GFRNONAA >60 10/07/2018 1007   GFRNONAA >60 08/11/2018 0900   GFRAA >60 10/07/2018 1007   GFRAA >60 08/11/2018 0900    No results found for: TOTALPROTELP, ALBUMINELP, A1GS, A2GS, BETS, BETA2SER, GAMS, MSPIKE, SPEI  No results found for: KPAFRELGTCHN, LAMBDASER, KAPLAMBRATIO  Lab  Results  Component Value Date   WBC 4.7 10/07/2018   NEUTROABS 2.3 10/07/2018   HGB 13.5 10/07/2018   HCT 41.4 10/07/2018   MCV 104.3 (H) 10/07/2018   PLT 194 10/07/2018      Chemistry      Component Value Date/Time   NA 141 10/07/2018 1007   K 4.3 10/07/2018 1007   CL 104 10/07/2018 1007   CO2 29 10/07/2018 1007   BUN 11 10/07/2018 1007   CREATININE 0.74 10/07/2018 1007   CREATININE 0.67 08/11/2018 0900      Component Value Date/Time   CALCIUM 9.7 10/07/2018 1007   ALKPHOS 67 10/07/2018 1007   AST 11 (L) 10/07/2018 1007   AST 13 (L) 08/11/2018 0900   ALT 9 10/07/2018 1007   ALT 13 08/11/2018 0900   BILITOT 0.8 10/07/2018 1007   BILITOT 0.7 08/11/2018 0900       No results found for: LABCA2  No components found for: MVEHMC947  No results for input(s): INR in the last 168 hours.  No results found for: LABCA2  No results found for: SJG283  No results found for: MOQ947  No results found for: MLY650  Lab Results  Component Value Date   CA2729 213.7 (H) 04/07/2018    No components found for: HGQUANT  No results found for: CEA1 / No results found for: CEA1   No results found for: AFPTUMOR  No results found for: CHROMOGRNA  No results found for: PSA1  No visits with results within 3 Day(s) from this visit.  Latest known visit with results is:  Appointment on 10/07/2018  Component Date Value Ref Range Status  . Sodium 10/07/2018 141  135 - 145 mmol/L Final  . Potassium 10/07/2018 4.3  3.5 - 5.1 mmol/L Final  . Chloride 10/07/2018 104  98 - 111 mmol/L Final  . CO2 10/07/2018 29  22 - 32 mmol/L Final  . Glucose, Bld 10/07/2018 90  70 - 99 mg/dL Final  . BUN 10/07/2018 11   6 - 20 mg/dL Final  . Creatinine, Ser 10/07/2018 0.74  0.44 - 1.00 mg/dL Final  . Calcium 10/07/2018 9.7  8.9 - 10.3 mg/dL Final  . Total Protein 10/07/2018 7.3  6.5 - 8.1 g/dL Final  . Albumin 10/07/2018 4.0  3.5 - 5.0 g/dL Final  . AST 10/07/2018 11* 15 - 41 U/L Final  . ALT 10/07/2018 9  0 - 44 U/L Final  . Alkaline Phosphatase 10/07/2018 67  38 - 126 U/L Final  . Total Bilirubin 10/07/2018 0.8  0.3 - 1.2 mg/dL Final  . GFR calc non Af Amer 10/07/2018 >60  >60 mL/min Final  . GFR calc Af Amer 10/07/2018 >60  >60 mL/min Final  . Anion gap 10/07/2018 8  5 - 15 Final   Performed at [facility], [place] [address], [place], [place] [address]  . WBC 10/07/2018 4.7  4.0 - 10.5 K/uL Final  . RBC 10/07/2018 3.97  3.87 - 5.11 MIL/uL Final  . Hemoglobin 10/07/2018 13.5  12.0 - 15.0 g/dL Final  . HCT 10/07/2018 41.4  36.0 - 46.0 % Final  . MCV 10/07/2018 104.3* 80.0 - 100.0 fL Final  . MCH 10/07/2018 34.0  26.0 - 34.0 pg Final  . MCHC 10/07/2018 32.6  30.0 - 36.0 g/dL Final  . RDW 10/07/2018 11.8  11.5 - 15.5 % Final  . Platelets 10/07/2018 194  150 - 400 K/uL Final  . nRBC 10/07/2018 0.0  0.0 -  0.2 % Final  . Neutrophils Relative % 10/07/2018 50  % Final  . Neutro Abs 10/07/2018 2.3  1.7 - 7.7 K/uL Final  . Lymphocytes Relative 10/07/2018 37  % Final  . Lymphs Abs 10/07/2018 1.8  0.7 - 4.0 K/uL Final  . Monocytes Relative 10/07/2018 10  % Final  . Monocytes Absolute 10/07/2018 0.5  0.1 - 1.0 K/uL Final  . Eosinophils Relative 10/07/2018 3  % Final  . Eosinophils Absolute 10/07/2018 0.1  0.0 - 0.5 K/uL Final  . Basophils Relative 10/07/2018 0  % Final  . Basophils Absolute 10/07/2018 0.0  0.0 - 0.1 K/uL Final  . Immature Granulocytes 10/07/2018 0  % Final  . Abs Immature Granulocytes 10/07/2018 0.01  0.00 - 0.07 K/uL Final   Performed at [name], [place] [address] [place], [place] [address]    (this displays the last labs from the last 3  days)  No results found for: TOTALPROTELP, ALBUMINELP, A1GS, A2GS, BETS, BETA2SER, GAMS, MSPIKE, SPEI (this displays SPEP labs)  No results found for: KPAFRELGTCHN, LAMBDASER, KAPLAMBRATIO (kappa/lambda light chains)  No results found for: HGBA, HGBA2QUANT, HGBFQUANT, HGBSQUAN (Hemoglobinopathy evaluation)   No results found for: LDH  No results found for: IRON, TIBC, IRONPCTSAT (Iron and TIBC)  No results found for: FERRITIN  Urinalysis No results found for: COLORURINE, APPEARANCEUR, LABSPEC, PHURINE, GLUCOSEU, HGBUR, BILIRUBINUR, KETONESUR, PROTEINUR, UROBILINOGEN, NITRITE, LEUKOCYTESUR   STUDIES: No results found.  ELIGIBLE FOR AVAILABLE RESEARCH PROTOCOL:  no   ASSESSMENT: 48 y.o. [place] woman status post left breast overlapping site biopsy and left axillary lymph node biopsy 03/16/2018 for a clinical T2 N3, stage IIIC invasive ductal carcinoma, triple negative, with an MIB-1 of 15%   (1) Staging studies  (a) CT chest on 04/05/2018: left axillary and chronic left retropectoral adenopathy, supraclavicular adenopathy, left breast skin thickening and mass, 60m left upper lobe pulmonary nodule (repeat ct chest in 3-6 months recommended), bronchiectasis in left lower lobe.  (b) CT neck on 04/05/2018: left supraclavicular adenopathy, ? Abnormality in left frontal lobe of brain  (c) Bone scan on 04/05/2018: no findings suggestive of metastatic disease to bone  (d) MRI brain 04/07/2018: negative  (2) neoadjuvant chemotherapy consisting of doxorubicin and cyclophosphamide in dose dense fashion x4 started 04/07/2018, completed 05/19/2018,  followed by weekly carboplatin and paclitaxel x10 started 06/02/2018, last dose 08/04/2018 (stopped two cycles early due to peripheral neuropathy)  (3) genetics testing 11/18: no deleterious mutations.  Genes tested include: AIP, ALK, APC, ATM, AXIN2,BAP1,  BARD1, BLM, BMPR1A, BRCA1, BRCA2, BRIP1, CASR, CDC73, CDH1, CDK4, CDKN1B, CDKN1C, CDKN2A  (p14ARF), CDKN2A (p16INK4a), CEBPA, CHEK2, CTNNA1, DICER1, DIS3L2, EGFR (c.2369C>T, p.Thr790Met variant only), EPCAM (Deletion/duplication testing only), FH, FLCN, GATA2, GPC3, GREM1 (Promoter region deletion/duplication testing only), HOXB13 (c.251G>A, p.Gly84Glu), HRAS, KIT, MAX, MEN1, MET, MITF (c.952G>A, p.Glu318Lys variant only), MLH1, MSH2, MSH3, MSH6, MUTYH, NBN, NF1, NF2, NTHL1, PALB2, PDGFRA, PHOX2B, PMS2, POLD1, POLE, POT1, PRKAR1A, PTCH1, PTEN, RAD50, RAD51C, RAD51D, RB1, RECQL4, RET, RUNX1, SDHAF2, SDHA (sequence changes only), SDHB, SDHC, SDHD, SMAD4, SMARCA4, SMARCB1, SMARCE1, STK11, SUFU, TERC, TERT, TMEM127, TP53, TSC1, TSC2, VHL, WRN and WT1.  (4) left mastectomy and axillary lymph node dissection 08/30/2018 showed a residual ypT1a ypN0 invasive ductal carcinoma, grade 2, with negative margins.  (a) 12 axillary nodes were removed  (b) repeat prognostic profile again triple negative, with an MIB-1 of 30%  (5) adjuvant radiation to be completed 11/22/2018  (6) Caris panel 10/31/2018 found insufficient tissue for PDL 1  testing, MSI was stable, mismatch repair indeterminant, no PI K3 mutation androgen receptor was insufficient tumor   PLAN: Paula Berry is tolerating radiation generally well and she will completed next week.  I have sent a note to her surgeon so she can have her port removed anytime that is convenient.  She understands it will be about 2 months before she starts feeling more energetic.  She can get there faster if she exercises regularly.  She is not planning on reconstruction.  I have written a prescription for bras and prostheses which she will explore once her left chest wall has healed some.  We discussed the Caris report which is generally incomplete because there was a little tumor left after the neoadjuvant chemotherapy.  She will have mammography in September and see me shortly after that  She knows to call for any other issue that may develop before that  visit.    Paula Berry, Virgie Dad, MD  11/15/18 3:11 PM Medical Oncology and Hematology [facility] [address] [place], [place] [address] Tel. [phone]    Fax. [phone]   I, Wilburn Mylar, am acting as scribe for Dr. Virgie Dad. Paula Berry.  I, Lurline Del MD, have reviewed the above documentation for accuracy and completeness, and I agree with the above.

## 2018-11-15 ENCOUNTER — Inpatient Hospital Stay: Payer: BC Managed Care – PPO | Attending: Oncology | Admitting: Oncology

## 2018-11-15 ENCOUNTER — Other Ambulatory Visit: Payer: Self-pay

## 2018-11-15 ENCOUNTER — Ambulatory Visit
Admission: RE | Admit: 2018-11-15 | Discharge: 2018-11-15 | Disposition: A | Discharge status: Home / Self Care | Payer: BC Managed Care – PPO | Source: Ambulatory Visit | Attending: Radiation Oncology | Admitting: Radiation Oncology

## 2018-11-15 ENCOUNTER — Inpatient Hospital Stay: Payer: BC Managed Care – PPO

## 2018-11-15 VITALS — BP 113/72 | HR 86 | Resp 18 | Ht 70.0 in | Wt 215.5 lb

## 2018-11-15 DIAGNOSIS — C50412 Malignant neoplasm of upper-outer quadrant of left female breast: Secondary | ICD-10-CM | POA: Insufficient documentation

## 2018-11-15 DIAGNOSIS — Z171 Estrogen receptor negative status [ER-]: Secondary | ICD-10-CM | POA: Diagnosis not present

## 2018-11-15 DIAGNOSIS — Z923 Personal history of irradiation: Secondary | ICD-10-CM | POA: Diagnosis not present

## 2018-11-15 DIAGNOSIS — Z9221 Personal history of antineoplastic chemotherapy: Secondary | ICD-10-CM | POA: Insufficient documentation

## 2018-11-15 DIAGNOSIS — Z510 Encounter for antineoplastic radiation therapy: Secondary | ICD-10-CM | POA: Diagnosis not present

## 2018-11-16 ENCOUNTER — Ambulatory Visit
Admission: RE | Admit: 2018-11-16 | Discharge: 2018-11-16 | Disposition: A | Discharge status: Home / Self Care | Payer: BC Managed Care – PPO | Source: Ambulatory Visit | Attending: Radiation Oncology | Admitting: Radiation Oncology

## 2018-11-16 ENCOUNTER — Other Ambulatory Visit: Payer: Self-pay

## 2018-11-16 DIAGNOSIS — Z510 Encounter for antineoplastic radiation therapy: Secondary | ICD-10-CM | POA: Diagnosis not present

## 2018-11-17 ENCOUNTER — Ambulatory Visit
Admission: RE | Admit: 2018-11-17 | Discharge: 2018-11-17 | Disposition: A | Discharge status: Home / Self Care | Payer: BC Managed Care – PPO | Source: Ambulatory Visit | Attending: Radiation Oncology | Admitting: Radiation Oncology

## 2018-11-17 ENCOUNTER — Other Ambulatory Visit: Payer: Self-pay

## 2018-11-17 DIAGNOSIS — Z510 Encounter for antineoplastic radiation therapy: Secondary | ICD-10-CM | POA: Diagnosis not present

## 2018-11-18 ENCOUNTER — Other Ambulatory Visit: Payer: Self-pay | Admitting: Radiation Oncology

## 2018-11-18 ENCOUNTER — Other Ambulatory Visit: Payer: Self-pay

## 2018-11-18 ENCOUNTER — Ambulatory Visit
Admission: RE | Admit: 2018-11-18 | Discharge: 2018-11-18 | Disposition: A | Discharge status: Home / Self Care | Payer: BC Managed Care – PPO | Source: Ambulatory Visit | Attending: Radiation Oncology | Admitting: Radiation Oncology

## 2018-11-18 DIAGNOSIS — Z510 Encounter for antineoplastic radiation therapy: Secondary | ICD-10-CM | POA: Diagnosis not present

## 2018-11-18 DIAGNOSIS — Z171 Estrogen receptor negative status [ER-]: Secondary | ICD-10-CM

## 2018-11-18 DIAGNOSIS — C50412 Malignant neoplasm of upper-outer quadrant of left female breast: Secondary | ICD-10-CM

## 2018-11-18 MED ORDER — MUPIROCIN 2 % EX OINT: TOPICAL_OINTMENT | CUTANEOUS | 0 refills | Status: DC

## 2018-11-21 ENCOUNTER — Other Ambulatory Visit: Payer: Self-pay | Admitting: Oncology

## 2018-11-21 ENCOUNTER — Ambulatory Visit
Admission: RE | Admit: 2018-11-21 | Discharge: 2018-11-21 | Disposition: A | Discharge status: Home / Self Care | Payer: BC Managed Care – PPO | Source: Ambulatory Visit | Attending: Radiation Oncology | Admitting: Radiation Oncology

## 2018-11-21 ENCOUNTER — Other Ambulatory Visit: Payer: Self-pay

## 2018-11-21 DIAGNOSIS — Z510 Encounter for antineoplastic radiation therapy: Secondary | ICD-10-CM | POA: Diagnosis not present

## 2018-11-22 ENCOUNTER — Encounter: Payer: Self-pay | Admitting: Radiation Oncology

## 2018-11-22 ENCOUNTER — Ambulatory Visit
Admission: RE | Admit: 2018-11-22 | Discharge: 2018-11-22 | Disposition: A | Discharge status: Home / Self Care | Payer: BC Managed Care – PPO | Source: Ambulatory Visit | Attending: Radiation Oncology | Admitting: Radiation Oncology

## 2018-11-22 ENCOUNTER — Encounter: Payer: Self-pay | Admitting: *Deleted

## 2018-11-22 ENCOUNTER — Other Ambulatory Visit: Payer: Self-pay

## 2018-11-22 DIAGNOSIS — Z510 Encounter for antineoplastic radiation therapy: Secondary | ICD-10-CM | POA: Diagnosis not present

## 2018-11-23 ENCOUNTER — Other Ambulatory Visit: Payer: Self-pay | Admitting: Oncology

## 2018-12-08 ENCOUNTER — Telehealth: Payer: Self-pay | Admitting: Radiation Oncology

## 2018-12-08 NOTE — Telephone Encounter (Signed)
New Message:    LVM for patient to let her know Dr.Squire will be out of the office on 12/23/18 and her appt has been rescheduled to 07/31 @ 2:20 with Dr. Isidore Moos. I asked patient to call back and confirm if this appt was okay.

## 2018-12-23 ENCOUNTER — Ambulatory Visit: Payer: Self-pay | Admitting: Radiation Oncology

## 2018-12-29 NOTE — Progress Notes (Signed)
  Patient Name: Paula Berry MRN: [id number] DOB: [birth year] Referring Physician: Lurline Del (Profile Not Attached) Date of Service: 11/22/2018 [facility], [place]                                                        End Of Treatment Note  Diagnoses: C50.412-Malignant neoplasm of upper-outer quadrant of left female breast  Cancer Staging Malignant neoplasm of upper-outer quadrant of left breast in female, estrogen receptor negative ([place]) Staging form: Breast, AJCC 8th Edition - Clinical stage from 03/23/2018: Stage IIIB (cT2, cN1, cM0, G3, ER-, PR-, HER2-) - Unsigned - Pathologic: No Stage Recommended (ypT1a, pN0, cM0, G2, ER-, PR-, HER2-) - Signed by Eppie Gibson, MD on 09/28/2018  Intent: Curative  Radiation Treatment Dates: 10/06/2018 through 11/22/2018 Site Technique Total Dose Dose per Fx Completed Fx Beam Energies  Breast: CW_Lt 3D 50.4/50.4 1.8 28/28 6X, 10X  Breast: CW_Lt_Bst Electron 10/10 2 5/5 6E  Breast: CW_Lt_SCV_PAB 3D 50.4/50.4 1.8 28/28 6X, 10X   Narrative: The patient tolerated radiation therapy relatively well. She experienced moderate fatigue and developed hyperpigmentation changes over the left chest wall. Skin remained intact. She is applying Radiaplex as directed.  Of note the patient's left chest wall, supraclavicular, posterior axilla and axillary nodes, and internal mammary nodes were treated.  Plan: The patient will follow-up with radiation oncology in one month.  ________________________________________________  Eppie Gibson, MD  This document serves as a record of services personally performed by Eppie Gibson, MD. It was created on her behalf by Rae Lips, a trained medical scribe. The creation of this record is based on the scribe's personal observations and the  provider's statements to them. This document has been checked and approved by the attending provider.

## 2018-12-30 ENCOUNTER — Ambulatory Visit
Admission: RE | Admit: 2018-12-30 | Discharge: 2018-12-30 | Disposition: A | Discharge status: Home / Self Care | Payer: BC Managed Care – PPO | Source: Ambulatory Visit | Attending: Radiation Oncology | Admitting: Radiation Oncology

## 2018-12-30 ENCOUNTER — Telehealth: Payer: Self-pay

## 2018-12-30 NOTE — Telephone Encounter (Signed)
[name] had a follow up appointment with Dr. Isidore Moos today which she has not arrived to. I've tried to contact her twice regarding this appointment. She was given my direct contact number to call me back as needed. I will cancel this appointment at this time.

## 2019-01-10 ENCOUNTER — Telehealth: Payer: Self-pay

## 2019-01-10 NOTE — Telephone Encounter (Signed)
Returned call to pt regarding vm left earlier. No answer.  LVM requesting pt call back.

## 2019-01-11 ENCOUNTER — Other Ambulatory Visit: Payer: Self-pay

## 2019-01-11 ENCOUNTER — Telehealth: Payer: Self-pay

## 2019-01-11 DIAGNOSIS — C50412 Malignant neoplasm of upper-outer quadrant of left female breast: Secondary | ICD-10-CM

## 2019-01-11 DIAGNOSIS — R911 Solitary pulmonary nodule: Secondary | ICD-10-CM

## 2019-01-11 NOTE — Telephone Encounter (Signed)
RN spoke with patient.  Pt requesting to make follow up appointment.  RN reviewed chart, follow up appointment made after mammogram per MD recommendations.  Pt scheduled for follow up CT scan as well.   Pt aware of appointment time and dates.  No further needs at this time.

## 2019-01-19 ENCOUNTER — Ambulatory Visit (HOSPITAL_COMMUNITY): Payer: BC Managed Care – PPO

## 2019-01-20 ENCOUNTER — Ambulatory Visit (HOSPITAL_COMMUNITY)
Admission: RE | Admit: 2019-01-20 | Discharge: 2019-01-20 | Disposition: A | Discharge status: Home / Self Care | Payer: BC Managed Care – PPO | Source: Ambulatory Visit | Attending: Oncology | Admitting: Oncology

## 2019-01-20 ENCOUNTER — Other Ambulatory Visit: Payer: Self-pay

## 2019-01-20 DIAGNOSIS — R911 Solitary pulmonary nodule: Secondary | ICD-10-CM

## 2019-01-20 DIAGNOSIS — Z171 Estrogen receptor negative status [ER-]: Secondary | ICD-10-CM | POA: Diagnosis present

## 2019-01-20 DIAGNOSIS — C50412 Malignant neoplasm of upper-outer quadrant of left female breast: Secondary | ICD-10-CM | POA: Diagnosis present

## 2019-01-20 MED ORDER — SODIUM CHLORIDE (PF) 0.9 % IJ SOLN
INTRAMUSCULAR | Status: AC
  Filled 2019-01-20: qty 50

## 2019-01-20 MED ORDER — IOHEXOL 300 MG/ML  SOLN
75.0000 mL | Freq: Once | INTRAMUSCULAR | Status: AC | PRN
  Administered 2019-01-20: 75 mL via INTRAVENOUS

## 2019-01-22 ENCOUNTER — Other Ambulatory Visit: Payer: Self-pay | Admitting: Oncology

## 2019-01-27 ENCOUNTER — Other Ambulatory Visit: Payer: Self-pay | Admitting: *Deleted

## 2019-02-07 ENCOUNTER — Telehealth: Payer: Self-pay

## 2019-02-07 NOTE — Telephone Encounter (Signed)
Pt lvm requesting return call. Call returned.  VM obtained.  LVM for pt to call back.

## 2019-02-09 ENCOUNTER — Telehealth: Payer: Self-pay | Admitting: *Deleted

## 2019-02-09 NOTE — Telephone Encounter (Signed)
Paula Berry called to inquire " can I get massages ".  She states she contacted " [employer] " and they asked her to verify with her MD that she can receive massages.  Pt is s/p left mastectomy with total of 13 lymph nodes removed in March of 2020.  This RN informed pt above needs further review and recommendation per physical therapist who is certified in lymph edema in pt's like her as well as MD.  This RN will follow up per above and then contact pt.

## 2019-03-20 ENCOUNTER — Ambulatory Visit
Admission: RE | Admit: 2019-03-20 | Discharge: 2019-03-20 | Disposition: A | Discharge status: Home / Self Care | Payer: BC Managed Care – PPO | Source: Ambulatory Visit | Attending: Oncology | Admitting: Oncology

## 2019-03-20 ENCOUNTER — Other Ambulatory Visit: Payer: Self-pay

## 2019-03-20 ENCOUNTER — Other Ambulatory Visit: Payer: Self-pay | Admitting: Oncology

## 2019-03-20 DIAGNOSIS — Z171 Estrogen receptor negative status [ER-]: Secondary | ICD-10-CM

## 2019-03-20 DIAGNOSIS — C50412 Malignant neoplasm of upper-outer quadrant of left female breast: Secondary | ICD-10-CM

## 2019-03-22 NOTE — Progress Notes (Signed)
[facility]  [phone] [phone]    ID: Paula Berry DOB: 1970-10-04  MR#: [id number]  BSJ#:[id number]  Patient Care Team: Vernie Shanks, MD as PCP - General (Family Medicine) Rolm Bookbinder, MD as Consulting Physician (General Surgery) Ramond Darnell, Virgie Dad, MD as Consulting Physician (Oncology) Eppie Gibson, MD as Attending Physician (Radiation Oncology) Everlene Farrier, MD as Consulting Physician (Obstetrics and Gynecology) Everlean Alstrom, MD as Attending Physician (Radiology) OTHER MD:   CHIEF COMPLAINT: Triple negative breast cancer (s/p left mastectomy)  CURRENT TREATMENT: Observation   INTERVAL HISTORY: Paula Berry was seen today for follow-up and treatment of her triple negative breast cancer.   She completed radiation treatments on 11/22/2018. She tolerated this generally well and did not experience any skin breakdown.  Since her last visit, she underwent chest CT on 01/20/2019, which showed: interval development of left upper lobe opacities, which may represent postradiation changes; stable 4 mm subpleural left upper lobe nodule.  She also underwent right screening mammography with tomography at [facility] on 03/20/2019 showing: breast density category C; no evidence of malignancy.   REVIEW OF SYSTEMS: Paula Berry wonders if she can have a massage.  She was not actually trained how to do lymph massage and she would be interested in learning.  She does not have any swelling of the arm or redness.  She does have pretty good range of motion in the left shoulder she says but even before she had any breast cancer problems she had pain in the left shoulder and this is keeping her up at night.  She is taking appropriate pandemic precautions and working from home.  She walks about 3 miles every couple of days does some yoga and does some stretching most days.  Detailed review of systems was otherwise stable   HISTORY OF  CURRENT ILLNESS: From the original intake note:  The patient had a left mammogram with tomography and ultrasonography 07/12/2015 at the [facility] after screening recall for a possible left breast mass.  This found the breast density to be category C.  There was a small lobulated mass in the lower outer aspect of the left breast, which was not palpable.  Ultrasound showed a small lobulated cyst in that area measuring 0.6 cm.  This was felt to be benign.  In July 2019 she had screening mammography at Dr. Clementeen Hoof office.  I do not have that report but according to the patient it was unremarkable.  In October 2019 however the patient developed left axillary swelling and tenderness and was again set up for left mammography with tomography and left breast ultrasonography at the [facility], 03/11/2018.  Breast density was category C.  Mammography showed numerous markedly enlarged left axillary lymph nodes.  There was periareolar skin thickening.  There was an area of ill-defined distortion in the upper outer left breast and on physical exam there was a firm lump superior and lateral to the left nipple, with swelling of the left axilla.  Ultrasound of the left breast and both upper quadrants found an ill-defined irregular hypoechoic area measuring at least 4.2 cm.  There also multiple scattered cysts.  Ultrasound of the left axilla showed at least 6 abnormal lymph nodes.  On 03/16/2018 the patient underwent right mammography with tomography and right breast ultrasonography at the [facility].  Again breast density was category C.  There were multiple circumscribed equal density masses in the upper outer quadrant of the right breast which appeared stable.  Ultrasound showed diffuse fibrocystic changes.  An additional hypoechoic mass was noted at the 9 o'clock position 3 cm from the nipple measuring 0.8 cm, with no associated vascularity.  A complex solid and cystic mass was noted superficially at the 6  o'clock position 3 cm from the nipple measuring 1.6 cm.  Evaluation of the right axilla was negative.   On 03/18/2018 the patient underwent biopsy of the 2 suspicious areas in the right breast, and they both showed only fibrocystic changes with some chronic inflammation ([id number]).  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  The patient's subsequent history is as detailed below.   PAST MEDICAL HISTORY: Past Medical History:  Diagnosis Date  . Anemia    had iron infusion 08/12/15  . Asthma   . Breast cancer ([place]) 03/2018   Left Breast Cancer  . Cancer [facility])    Breast cancer   . Genital herpes   . GERD (gastroesophageal reflux disease)   . Headache    migraines 2-3 times monthly  . Pneumonia    hospitalizied for pneumonia as a child    PAST SURGICAL HISTORY: Past Surgical History:  Procedure Laterality Date  . ABDOMINAL HYSTERECTOMY Bilateral 08/22/2015   Procedure: HYSTERECTOMY ABDOMINAL, BILATERAL SALPINGECTOMY;  Surgeon: Everlene Farrier, MD;  Location: [facility];  Service: Gynecology;  Laterality: Bilateral;  . MASTECTOMY Left 07/2018  . MASTECTOMY WITH AXILLARY LYMPH NODE DISSECTION Left 08/30/2018   Procedure: Left mastectomy with axillary lymph node dissection;  Surgeon: Rolm Bookbinder, MD;  Location: [facility];  Service: General;  Laterality: Left;  . MYOMECTOMY ABDOMINAL APPROACH    . PORTACATH PLACEMENT N/A 03/30/2018   Procedure: INSERTION PORT-A-CATH WITH [place];  Surgeon: Rolm Bookbinder, MD;  Location: [facility];  Service: General;  Laterality: N/A;    FAMILY HISTORY: Family History  Problem Relation Age of Onset  . Hypertension Other   . Diabetes Cousin        mat first cousin  . Cancer Other   . Dementia Maternal Grandfather    . Lung cancer Paternal Grandmother        d. 23-60  . Brain cancer Paternal Aunt        d. 10, father's mat 1/2 sister  . Diabetes Maternal Grandmother   . Other Paternal Grandfather        d. WWII  . Aneurysm Maternal Uncle        brain  . Migraines Neg Hx    The patient's parents are in their early 80s as of October 2019.  A paternal aunt had brain cancer at age 29.  A paternal grandmother had lung cancer at age 47.  There is no breast or ovarian cancer history in the family.  However note that the patient has little information on her mother side of the family.   GYNECOLOGIC HISTORY:  No LMP recorded. Patient has had a hysterectomy. 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  Her husband Paula Berry. works for the [employer].  He is a former  patient here, with stage IV colon cancer.  They live alone, with no pets.     ADVANCED DIRECTIVES: Not in place   HEALTH MAINTENANCE: Social History   Tobacco Use  . Smoking status: Never Smoker  . Smokeless tobacco: Never Used  Substance Use Topics  . Alcohol use: Yes    Alcohol/week: 1.0 standard drinks    Types: 1 Glasses of wine per week    Comment: daily  . Drug use: No     Colonoscopy: Never  PAP: Status post hysterectomy  Bone density: Never   Allergies  Allergen Reactions  . Neosporin [Neomycin-Bacitracin Zn-Polymyx] Other (See Comments)    Decreased wound healing with blisters    Current Outpatient Medications  Medication Sig Dispense Refill  . acetaminophen (TYLENOL) 500 MG tablet Take 1,000 mg by mouth every 8 (eight) hours as needed for moderate pain.    Marland Kitchen acyclovir ointment (ZOVIRAX) 5 % Apply 1 application topically 4 (four) times daily as  needed (outbreaks).   2  . albuterol (PROVENTIL HFA;VENTOLIN HFA) 108 (90 Base) MCG/ACT inhaler Inhale 2 puffs into the lungs every 6 (six) hours as needed for wheezing or shortness of breath.     . gabapentin (NEURONTIN) 300 MG capsule Take 1 capsule (300 mg total) by mouth at bedtime. 90 capsule 4  . methocarbamol (ROBAXIN) 500 MG tablet Take 1 tablet (500 mg total) by mouth 3 (three) times daily. 20 tablet 1  . montelukast (SINGULAIR) 10 MG tablet Take 10 mg by mouth every evening.    . mupirocin ointment (BACTROBAN) 2 % Apply to peeling skin BID-TID PRN 22 g 0  . oxyCODONE (OXY IR/ROXICODONE) 5 MG immediate release tablet Take 1 tablet (5 mg total) by mouth every 4 (four) hours as needed for moderate pain. (Patient not taking: Reported on 09/27/2018) 12 tablet 0  . pantoprazole (PROTONIX) 40 MG tablet Take 40 mg by mouth every evening.    . SYMBICORT 160-4.5 MCG/ACT inhaler INL 2 PFS PO BID  3  . valACYclovir (VALTREX) 500 MG tablet Take 500 mg by mouth See admin instructions. Take 500 mg twice daily for 3 days at onset of outbreak then take 500 mg once daily until clear     No current facility-administered medications for this visit.     OBJECTIVE: Young appearing African-American woman in no acute distress Vitals:   03/23/19 1446  BP: 127/85  Pulse: 93  Resp: 20  Temp: 98.5 F (36.9 C)  SpO2: 99%     Body mass index is 33.12 kg/m.   Wt Readings from Last 3 Encounters:  03/23/19 230 lb 12.8 oz (104.7 kg)  11/15/18 215 lb 8 oz (97.8 kg)  10/07/18 213 lb 12.8 oz (97 kg)  ECOG FS: 1 - Symptomatic but completely ambulatory   Sclerae unicteric, EOMs intact Wearing a mask No cervical or supraclavicular adenopathy Lungs no rales or rhonchi Heart regular rate and rhythm Abd soft, nontender, positive bowel sounds MSK no focal spinal tenderness, no upper extremity lymphedema Neuro: nonfocal, well oriented, appropriate affect Breasts: The right breast is benign per the left wrist  status post mastectomy and radiation.  There is still hyperpigmentation.  There is no evidence of disease recurrence.  Both axillae are benign.   LAB RESULTS:  CMP     Component Value Date/Time   NA 141 10/07/2018 1007   K 4.3 10/07/2018 1007   CL 104 10/07/2018 1007   CO2 29 10/07/2018 1007   GLUCOSE 90 10/07/2018 1007  BUN 11 10/07/2018 1007   CREATININE 0.74 10/07/2018 1007   CREATININE 0.67 08/11/2018 0900   CALCIUM 9.7 10/07/2018 1007   PROT 7.3 10/07/2018 1007   ALBUMIN 4.0 10/07/2018 1007   AST 11 (L) 10/07/2018 1007   AST 13 (L) 08/11/2018 0900   ALT 9 10/07/2018 1007   ALT 13 08/11/2018 0900   ALKPHOS 67 10/07/2018 1007   BILITOT 0.8 10/07/2018 1007   BILITOT 0.7 08/11/2018 0900   GFRNONAA >60 10/07/2018 1007   GFRNONAA >60 08/11/2018 0900   GFRAA >60 10/07/2018 1007   GFRAA >60 08/11/2018 0900    No results found for: Ronnald Ramp, A1GS, A2GS, BETS, BETA2SER, GAMS, MSPIKE, SPEI  No results found for: Nils Pyle, [facility]  Lab Results  Component Value Date   WBC 4.7 10/07/2018   NEUTROABS 2.3 10/07/2018   HGB 13.5 10/07/2018   HCT 41.4 10/07/2018   MCV 104.3 (H) 10/07/2018   PLT 194 10/07/2018      Chemistry      Component Value Date/Time   NA 141 10/07/2018 1007   K 4.3 10/07/2018 1007   CL 104 10/07/2018 1007   CO2 29 10/07/2018 1007   BUN 11 10/07/2018 1007   CREATININE 0.74 10/07/2018 1007   CREATININE 0.67 08/11/2018 0900      Component Value Date/Time   CALCIUM 9.7 10/07/2018 1007   ALKPHOS 67 10/07/2018 1007   AST 11 (L) 10/07/2018 1007   AST 13 (L) 08/11/2018 0900   ALT 9 10/07/2018 1007   ALT 13 08/11/2018 0900   BILITOT 0.8 10/07/2018 1007   BILITOT 0.7 08/11/2018 0900       No results found for: LABCA2  No components found for: ESPQZR007  No results for input(s): INR in the last 168 hours.  No results found for: LABCA2  No results found for: MAU633  No results found for: HLK562  No  results found for: BWL893  Lab Results  Component Value Date   CA2729 213.7 (H) 04/07/2018    No components found for: HGQUANT  No results found for: CEA1 / No results found for: CEA1   No results found for: AFPTUMOR  No results found for: CHROMOGRNA  No results found for: PSA1  No visits with results within 3 Day(s) from this visit.  Latest known visit with results is:  Appointment on 10/07/2018  Component Date Value Ref Range Status  . Sodium 10/07/2018 141  135 - 145 mmol/L Final  . Potassium 10/07/2018 4.3  3.5 - 5.1 mmol/L Final  . Chloride 10/07/2018 104  98 - 111 mmol/L Final  . CO2 10/07/2018 29  22 - 32 mmol/L Final  . Glucose, Bld 10/07/2018 90  70 - 99 mg/dL Final  . BUN 10/07/2018 11  6 - 20 mg/dL Final  . Creatinine, Ser 10/07/2018 0.74  0.44 - 1.00 mg/dL Final  . Calcium 10/07/2018 9.7  8.9 - 10.3 mg/dL Final  . Total Protein 10/07/2018 7.3  6.5 - 8.1 g/dL Final  . Albumin 10/07/2018 4.0  3.5 - 5.0 g/dL Final  . AST 10/07/2018 11* 15 - 41 U/L Final  . ALT 10/07/2018 9  0 - 44 U/L Final  . Alkaline Phosphatase 10/07/2018 67  38 - 126 U/L Final  . Total Bilirubin 10/07/2018 0.8  0.3 - 1.2 mg/dL Final  . GFR calc non Af Amer 10/07/2018 >60  >60 mL/min Final  . GFR calc Af Amer 10/07/2018 >60  >60 mL/min Final  . Anion gap 10/07/2018  8  5 - 15 Final   Performed at [facility], [place] [address] [place], [place] [address]  . WBC 10/07/2018 4.7  4.0 - 10.5 K/uL Final  . RBC 10/07/2018 3.97  3.87 - 5.11 MIL/uL Final  . Hemoglobin 10/07/2018 13.5  12.0 - 15.0 g/dL Final  . HCT 10/07/2018 41.4  36.0 - 46.0 % Final  . MCV 10/07/2018 104.3* 80.0 - 100.0 fL Final  . MCH 10/07/2018 34.0  26.0 - 34.0 pg Final  . MCHC 10/07/2018 32.6  30.0 - 36.0 g/dL Final  . RDW 10/07/2018 11.8  11.5 - 15.5 % Final  . Platelets 10/07/2018 194  150 - 400 K/uL Final  . nRBC 10/07/2018 0.0  0.0 - 0.2 % Final  . Neutrophils Relative % 10/07/2018 50  % Final  .  Neutro Abs 10/07/2018 2.3  1.7 - 7.7 K/uL Final  . Lymphocytes Relative 10/07/2018 37  % Final  . Lymphs Abs 10/07/2018 1.8  0.7 - 4.0 K/uL Final  . Monocytes Relative 10/07/2018 10  % Final  . Monocytes Absolute 10/07/2018 0.5  0.1 - 1.0 K/uL Final  . Eosinophils Relative 10/07/2018 3  % Final  . Eosinophils Absolute 10/07/2018 0.1  0.0 - 0.5 K/uL Final  . Basophils Relative 10/07/2018 0  % Final  . Basophils Absolute 10/07/2018 0.0  0.0 - 0.1 K/uL Final  . Immature Granulocytes 10/07/2018 0  % Final  . Abs Immature Granulocytes 10/07/2018 0.01  0.00 - 0.07 K/uL Final   Performed at [facility], [place] [address] [place], [place] [address]    (this displays the last labs from the last 3 days)  No results found for: TOTALPROTELP, ALBUMINELP, A1GS, A2GS, BETS, BETA2SER, GAMS, MSPIKE, SPEI (this displays SPEP labs)  No results found for: KPAFRELGTCHN, LAMBDASER, KAPLAMBRATIO (kappa/lambda light chains)  No results found for: HGBA, HGBA2QUANT, HGBFQUANT, HGBSQUAN (Hemoglobinopathy evaluation)   No results found for: LDH  No results found for: IRON, TIBC, IRONPCTSAT (Iron and TIBC)  No results found for: FERRITIN  Urinalysis No results found for: COLORURINE, APPEARANCEUR, LABSPEC, PHURINE, GLUCOSEU, HGBUR, BILIRUBINUR, KETONESUR, PROTEINUR, UROBILINOGEN, NITRITE, LEUKOCYTESUR   STUDIES: Mm 3d Screen Breast Uni Right  Result Date: 03/21/2019 CLINICAL DATA:  Screening. EXAM: DIGITAL SCREENING UNILATERAL RIGHT MAMMOGRAM WITH CAD AND TOMO COMPARISON:  Previous exam(s). ACR Breast Density Category c: The breast tissue is heterogeneously dense, which may obscure small masses. FINDINGS: The patient has had a left mastectomy. There are no findings suspicious for malignancy. Images were processed with CAD. IMPRESSION: No mammographic evidence of malignancy. A result letter of this screening mammogram will be mailed directly to the patient. RECOMMENDATION: Screening  mammogram in one year.  (Code:SM-R-14M) BI-RADS CATEGORY  1: Negative. Electronically Signed   By: Kristopher Oppenheim M.D.   On: 03/21/2019 09:08    ELIGIBLE FOR AVAILABLE RESEARCH PROTOCOL:  no   ASSESSMENT: 48 y.o. [place] woman status post left breast overlapping site biopsy and left axillary lymph node biopsy 03/16/2018 for a clinical T2 N3, stage IIIC invasive ductal carcinoma, triple negative, with an MIB-1 of 15%   (1) Staging studies  (a) CT chest on 04/05/2018: left axillary and chronic left retropectoral adenopathy, supraclavicular adenopathy, left breast skin thickening and mass, 40m left upper lobe pulmonary nodule (repeat ct chest in 3-6 months recommended), bronchiectasis in left lower lobe.  (b) CT neck on 04/05/2018: left supraclavicular adenopathy, ? Abnormality in left frontal lobe of brain  (c) Bone scan  on 04/05/2018: no findings suggestive of metastatic disease to bone  (d) MRI brain 04/07/2018: negative  (2) neoadjuvant chemotherapy consisting of doxorubicin and cyclophosphamide in dose dense fashion x4 started 04/07/2018, completed 05/19/2018,  followed by weekly carboplatin and paclitaxel x10 started 06/02/2018, last dose 08/04/2018 (stopped two cycles early due to peripheral neuropathy)  (3) genetics testing 11/18: no deleterious mutations.  Genes tested include: AIP, ALK, APC, ATM, AXIN2,BAP1,  BARD1, BLM, BMPR1A, BRCA1, BRCA2, BRIP1, CASR, CDC73, CDH1, CDK4, CDKN1B, CDKN1C, CDKN2A (p14ARF), CDKN2A (p16INK4a), CEBPA, CHEK2, CTNNA1, DICER1, DIS3L2, EGFR (c.2369C>T, p.Thr790Met variant only), EPCAM (Deletion/duplication testing only), FH, FLCN, GATA2, GPC3, GREM1 (Promoter region deletion/duplication testing only), HOXB13 (c.251G>A, p.Gly84Glu), HRAS, KIT, MAX, MEN1, MET, MITF (c.952G>A, p.Glu318Lys variant only), MLH1, MSH2, MSH3, MSH6, MUTYH, NBN, NF1, NF2, NTHL1, PALB2, PDGFRA, PHOX2B, PMS2, POLD1, POLE, POT1, PRKAR1A, PTCH1, PTEN, RAD50, RAD51C, RAD51D, RB1, RECQL4, RET, RUNX1,  SDHAF2, SDHA (sequence changes only), SDHB, SDHC, SDHD, SMAD4, SMARCA4, SMARCB1, SMARCE1, STK11, SUFU, TERC, TERT, TMEM127, TP53, TSC1, TSC2, VHL, WRN and WT1.  (4) left mastectomy and axillary lymph node dissection 08/30/2018 showed a residual ypT1a ypN0 invasive ductal carcinoma, grade 2, with negative margins.  (a) 12 axillary nodes were removed  (b) repeat prognostic profile again triple negative, with an MIB-1 of 30%  (5) adjuvant radiation completed 11/22/2018  (6) [facility] panel 10/31/2018 found insufficient tissue for PDL 1 testing, MSI was stable, mismatch repair indeterminate, no PI K3 mutation, androgen receptor insufficient tumor   PLAN: Jouri is now half a year out from definitive surgery for her breast cancer.  She is recovering well.  She has made a definitive decision that she does not want reconstruction.  She is comfortable with a prosthesis on the breast that she has.  She would like to learn more about lymphatic massage and I am sending her back to physical therapy for that.  In addition she has left shoulder pain which I do not think is going to be related to her breast surgery.  It preceded the breast surgery.  I am going to send her to Dr. Onnie Graham for evaluation of possible bursitis versus rotator cuff issues  We reviewed her mammogram from 2 days ago which shows density C breast but was otherwise unremarkable.  Encouraged her to continue her exercise program and intensified if possible.  She will return to see me in April of next year.  She knows to call for any other issue that may develop before then.   Marisela Line, Virgie Dad, MD  03/23/19 3:26 PM Medical Oncology and Hematology [facility] [address] [place], [place] [address] Tel. [phone]    Fax. [phone]   I, Wilburn Mylar, am acting as scribe for Dr. Virgie Dad. Trevaughn Schear.  I, Lurline Del MD, have reviewed the above documentation for accuracy and completeness, and I agree  with the above.

## 2019-03-23 ENCOUNTER — Encounter: Payer: Self-pay | Admitting: Oncology

## 2019-03-23 ENCOUNTER — Other Ambulatory Visit: Payer: Self-pay

## 2019-03-23 ENCOUNTER — Inpatient Hospital Stay: Payer: BC Managed Care – PPO | Attending: Oncology | Admitting: Oncology

## 2019-03-23 ENCOUNTER — Encounter: Payer: Self-pay | Admitting: *Deleted

## 2019-03-23 VITALS — BP 127/85 | HR 93 | Temp 98.5°F | Resp 20 | Ht 70.0 in | Wt 230.8 lb

## 2019-03-23 DIAGNOSIS — Z923 Personal history of irradiation: Secondary | ICD-10-CM | POA: Diagnosis not present

## 2019-03-23 DIAGNOSIS — J45909 Unspecified asthma, uncomplicated: Secondary | ICD-10-CM | POA: Insufficient documentation

## 2019-03-23 DIAGNOSIS — Z7951 Long term (current) use of inhaled steroids: Secondary | ICD-10-CM | POA: Insufficient documentation

## 2019-03-23 DIAGNOSIS — Z79899 Other long term (current) drug therapy: Secondary | ICD-10-CM | POA: Insufficient documentation

## 2019-03-23 DIAGNOSIS — R918 Other nonspecific abnormal finding of lung field: Secondary | ICD-10-CM | POA: Diagnosis not present

## 2019-03-23 DIAGNOSIS — M25512 Pain in left shoulder: Secondary | ICD-10-CM | POA: Insufficient documentation

## 2019-03-23 DIAGNOSIS — Z801 Family history of malignant neoplasm of trachea, bronchus and lung: Secondary | ICD-10-CM | POA: Insufficient documentation

## 2019-03-23 DIAGNOSIS — Z9012 Acquired absence of left breast and nipple: Secondary | ICD-10-CM

## 2019-03-23 DIAGNOSIS — Z90710 Acquired absence of both cervix and uterus: Secondary | ICD-10-CM | POA: Diagnosis not present

## 2019-03-23 DIAGNOSIS — Z171 Estrogen receptor negative status [ER-]: Secondary | ICD-10-CM | POA: Diagnosis not present

## 2019-03-23 DIAGNOSIS — C50412 Malignant neoplasm of upper-outer quadrant of left female breast: Secondary | ICD-10-CM

## 2019-03-23 DIAGNOSIS — Z9079 Acquired absence of other genital organ(s): Secondary | ICD-10-CM | POA: Insufficient documentation

## 2019-03-23 DIAGNOSIS — C50812 Malignant neoplasm of overlapping sites of left female breast: Secondary | ICD-10-CM | POA: Diagnosis not present

## 2019-03-23 DIAGNOSIS — Z808 Family history of malignant neoplasm of other organs or systems: Secondary | ICD-10-CM | POA: Insufficient documentation

## 2019-03-24 ENCOUNTER — Telehealth: Payer: Self-pay | Admitting: Adult Health

## 2019-03-24 NOTE — Telephone Encounter (Signed)
Scheduled SCP per 10/23 sch message - mailed reminder letter with appt date and time

## 2019-04-06 ENCOUNTER — Ambulatory Visit: Payer: BC Managed Care – PPO | Attending: Oncology | Admitting: Rehabilitation

## 2019-04-06 ENCOUNTER — Encounter: Payer: Self-pay | Admitting: Rehabilitation

## 2019-04-06 ENCOUNTER — Other Ambulatory Visit: Payer: Self-pay

## 2019-04-06 DIAGNOSIS — C50412 Malignant neoplasm of upper-outer quadrant of left female breast: Secondary | ICD-10-CM | POA: Diagnosis present

## 2019-04-06 DIAGNOSIS — M25612 Stiffness of left shoulder, not elsewhere classified: Secondary | ICD-10-CM | POA: Diagnosis present

## 2019-04-06 DIAGNOSIS — M25512 Pain in left shoulder: Secondary | ICD-10-CM | POA: Diagnosis present

## 2019-04-06 DIAGNOSIS — Z171 Estrogen receptor negative status [ER-]: Secondary | ICD-10-CM | POA: Insufficient documentation

## 2019-04-06 DIAGNOSIS — I890 Lymphedema, not elsewhere classified: Secondary | ICD-10-CM | POA: Diagnosis present

## 2019-04-06 DIAGNOSIS — R293 Abnormal posture: Secondary | ICD-10-CM | POA: Insufficient documentation

## 2019-04-06 NOTE — Therapy (Deleted)
[place], [place], [address] Phone: [phone]   Fax:  [phone]  Physical Therapy Evaluation  Patient Details  Name: Paula Berry MRN: [id number] Date of Birth: [birth year] Referring Provider (PT): Dr. [name]    Encounter Date: 04/06/2019  PT End of Session - 04/06/19 1649    Visit Number  1    Number of Visits  11    Date for PT Re-Evaluation  05/18/19    PT Start Time  1600    PT Stop Time  1647    PT Time Calculation (min)  47 min    Activity Tolerance  Patient tolerated treatment well    Behavior During Therapy  [facility] for tasks assessed/performed       Past Medical History:  Diagnosis Date  . Anemia    had iron infusion 08/12/15  . Asthma   . Breast cancer ([place]) 03/2018   Left Breast Cancer  . Cancer [facility])    Breast cancer   . Genital herpes   . GERD (gastroesophageal reflux disease)   . Headache    migraines 2-3 times monthly  . Pneumonia    hospitalizied for pneumonia as a child    Past Surgical History:  Procedure Laterality Date  . ABDOMINAL HYSTERECTOMY Bilateral 08/22/2015   Procedure: HYSTERECTOMY ABDOMINAL, BILATERAL SALPINGECTOMY;  Surgeon: Everlene Farrier, MD;  Location: [facility];  Service: Gynecology;  Laterality: Bilateral;  . MASTECTOMY Left 07/2018  . MASTECTOMY WITH AXILLARY LYMPH NODE DISSECTION Left 08/30/2018   Procedure: Left mastectomy with axillary lymph node dissection;  Surgeon: Rolm Bookbinder, MD;  Location: [place];  Service: General;  Laterality: Left;  . MYOMECTOMY ABDOMINAL APPROACH    . PORTACATH PLACEMENT N/A 03/30/2018   Procedure: INSERTION PORT-A-CATH WITH [place];  Surgeon: Rolm Bookbinder, MD;  Location: [facility];  Service: General;  Laterality: N/A;    There were no vitals filed for this visit.   Subjective Assessment - 04/06/19 1605    Subjective  I feel really heavy over here. (pointing upper arm and into the  chest).  Uncomfortable.    Pertinent History  S/p Lt modified radical mastectomy on 08/30/18 with Dr. [name].  SLNB x 12 lymph nodes all negative. Chemo and radiation completed. History of migraines.    Patient Stated Goals  improve the left side    Currently in Pain?  Yes    Pain Score  2     Pain Location  Arm    Pain Orientation  Left    Pain Descriptors / Indicators  Aching    Pain Type  Chronic pain    Pain Onset  More than a month ago    Pain Frequency  Constant    Aggravating Factors   its just there    Pain Relieving Factors  nothing really         [facility] PT Assessment - 04/06/19 0001      Assessment   Medical Diagnosis  lt mastectomy     Referring Provider (PT)  Dr. [name]     Onset Date/Surgical Date  08/30/18    Hand Dominance  Right      Precautions   Precaution Comments  lymphedema, cancer history      Restrictions   Weight Bearing Restrictions  No      Balance Screen   Has the patient fallen in the past 6 months  No    Has the patient had a decrease in  activity level because of a fear of falling?   No    Is the patient reluctant to leave their home because of a fear of falling?   No      Home Film/video editor residence    Living Arrangements  Spouse/significant other      Prior Function   Level of Independence  Independent    Vocation  Full time employment    Vocation Requirements  desk; therapist    Leisure  walking a bit need to do more      Cognition   Overall Cognitive Status  Within Functional Limits for tasks assessed      Observation/Other Assessments   Observations  well healed mastectomy incision with radiation evident by skin darkening.  some puckering present medial and lateral incision but no significant incision restrictions      Sensation   Light Touch  Appears Intact      Coordination   Gross Motor Movements are Fluid and Coordinated  Yes      Posture/Postural Control   Posture/Postural Control   Postural limitations    Postural Limitations  Rounded Shoulders;Forward head      AROM   Right Shoulder Horizontal ABduction  29 Degrees    Left Shoulder Flexion  147 Degrees    Left Shoulder ABduction  145 Degrees   pull   Left Shoulder External Rotation  80 Degrees    Left Shoulder Horizontal ABduction  16 Degrees      Flexibility   Soft Tissue Assessment /Muscle Length  yes   pectoralis tight in D2 position supine       LYMPHEDEMA/ONCOLOGY QUESTIONNAIRE - 04/06/19 1611      Type   Cancer Type  Left breast cancer      Surgeries   Mastectomy Date  08/30/18    Sentinel Lymph Node Biopsy Date  08/30/18    Number Lymph Nodes Removed  12      Treatment   Active Chemotherapy Treatment  No    Past Chemotherapy Treatment  Yes    Active Radiation Treatment  No    Past Radiation Treatment  Yes    Current Hormone Treatment  No    Past Hormone Therapy  No      What other symptoms do you have   Are you Having Heaviness or Tightness  Yes    Are you having Pain  Yes      Right Upper Extremity Lymphedema   At Axilla   40 cm    15 cm Proximal to Olecranon Process  35.8 cm    10 cm Proximal to Olecranon Process  33.7 cm    Olecranon Process  27.5 cm    15 cm Proximal to Ulnar Styloid Process  25.5 cm    10 cm Proximal to Ulnar Styloid Process  20.5 cm    Just Proximal to Ulnar Styloid Process  16.1 cm    Across Hand at [employer]  18.6 cm    At [place] of 2nd Digit  6 cm    Other  2294      Left Upper Extremity Lymphedema   At Axilla   40 cm    15 cm Proximal to Olecranon Process  36.3 cm    10 cm Proximal to Olecranon Process  35.2 cm    Olecranon Process  27 cm    15 cm Proximal to Ulnar Styloid Process  23.8 cm    10  cm Proximal to Ulnar Styloid Process  20.3 cm    Just Proximal to Ulnar Styloid Process  16.2 cm    Across Hand at [employer]  18.7 cm    At [place] of 2nd Digit  5.9 cm    Other  10.5    Other  2282          Quick Dash - 04/06/19 0001     Open a tight or new jar  Mild difficulty    Do heavy household chores (wash walls, wash floors)  No difficulty    Carry a shopping bag or briefcase  Mild difficulty    Wash your back  Mild difficulty    Use a knife to cut food  No difficulty    Recreational activities in which you take some force or impact through your arm, shoulder, or hand (golf, hammering, tennis)  No difficulty    During the past week, to what extent has your arm, shoulder or hand problem interfered with your normal social activities with family, friends, neighbors, or groups?  Slightly    During the past week, to what extent has your arm, shoulder or hand problem limited your work or other regular daily activities  Slightly    Arm, shoulder, or hand pain.  Moderate    Tingling (pins and needles) in your arm, shoulder, or hand  None    Difficulty Sleeping  Mild difficulty    DASH Score  18.18 %        Objective measurements completed on examination: See above findings.              PT Education - 04/06/19 1653    Education Details  HEP update, lymphedema and compression    Person(s) Educated  Patient    Methods  Explanation;Demonstration;Tactile cues;Verbal cues;Handout    Comprehension  Verbalized understanding;Returned demonstration;Verbal cues required;Tactile cues required          PT Long Term Goals - 04/06/19 1700      PT LONG TERM GOAL #1   Title  Pt will obtain appropriate compression for the left UE with instruction on use    Time  6    Period  Weeks    Status  New      PT LONG TERM GOAL #2   Title  Pt will increase Lt shoulder horizontal abduction to at least 29 deg    Baseline  16 on 04/06/19    Time  6    Period  Weeks    Status  New      PT LONG TERM GOAL #3   Title  Pt will report only intermittent tightness in the left upper quadrant    Time  6    Period  Weeks    Status  New      PT LONG TERM GOAL #4   Title  Pt will be ind with final HEP    Time  6    Period  Weeks     Status  New             Plan - 04/06/19 1653    Clinical Impression Statement  Pt returns to therapy here post radiation with feelings of just being tight in the left upper quadrant.  Lymphedema vs muscle restriction hard to differentiate today.  Only 27ml difference per volume measure calcuations but with mild difference upper two upper arm locations.  pt does have soft tissue tightness in the pectoralis, latissimus, and  scapular muscles that may be contributing to the tightness vs any lymphedema.  Due to numbner of lymph nodes removed and radiation history we will proceed with compression and teach MLD per pt request.    Personal Factors and Comorbidities  Comorbidity 2    Comorbidities  node removal, radiation    Examination-Activity Limitations  Lift;Carry;Reach Overhead    Examination-Participation Restrictions  Cleaning;Community Activity;Yard Work    Public affairs consultant  Low    Rehab Potential  Excellent    PT Frequency  2x / week    PT Duration  6 weeks    PT Treatment/Interventions  ADLs/Self Care Home Management;Manual techniques;Therapeutic exercise;Taping;Therapeutic activities;Patient/family education;Manual lymph drainage;Passive range of motion    PT Next Visit Plan  review stretches, get measured for sleeve?, show and begin to teach pt self MLD for the Lt UE, give links.  vs start left Upper quadrant PROM and STM    PT Home Exercise Plan  Access Code: [email or web address]    Consulted and Agree with Plan of Care  Patient       Patient will benefit from skilled therapeutic intervention in order to improve the following deficits and impairments:  Decreased skin integrity, Decreased range of motion, Decreased scar mobility, Decreased activity tolerance, Decreased knowledge of use of DME, Decreased strength, Impaired UE functional use, Pain, Increased edema  Visit Diagnosis: Malignant neoplasm of upper-outer  quadrant of left breast in female, estrogen receptor negative (HCC)  Abnormal posture  Stiffness of left shoulder, not elsewhere classified  Lymphedema     Problem List Patient Active Problem List   Diagnosis Date Noted  . S/P mastectomy, left 08/30/2018  . 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 Bronchiectasis ([place]) 07/21/2018  . Genetic testing 05/03/2018  . Port-A-Cath in place 04/07/2018  . Malignant neoplasm of upper-outer quadrant of left breast in female, estrogen receptor negative ([place]) 03/21/2018  . Migraine 11/21/2015  . Fibroids 08/22/2015    Paula Berry 04/06/2019, 5:04 PM  [place], [place], [address] Phone: [phone]   Fax:  [phone]  Name: Paula Berry MRN: [id number] Date of Birth: Jul 09, 1970

## 2019-04-06 NOTE — Therapy (Signed)
[place], [place], [address] Phone: [phone]   Fax:  [phone]  Physical Therapy Evaluation  Patient Details  Name: Paula Berry MRN: [id number] Date of Birth: 20-Nov-1970 Referring Provider (PT): Dr. Jana Hakim   Encounter Date: 04/06/2019  PT End of Session - 04/06/19 1649    Visit Number  1    Number of Visits  11    Date for PT Re-Evaluation  05/18/19    PT Start Time  1600    PT Stop Time  1647    PT Time Calculation (min)  47 min    Activity Tolerance  Patient tolerated treatment well    Behavior During Therapy  [facility] for tasks assessed/performed       Past Medical History:  Diagnosis Date  . Anemia    had iron infusion 08/12/15  . Asthma   . Breast cancer ([place]) 03/2018   Left Breast Cancer  . Cancer [facility])    Breast cancer   . Genital herpes   . GERD (gastroesophageal reflux disease)   . Headache    migraines 2-3 times monthly  . Pneumonia    hospitalizied for pneumonia as a child    Past Surgical History:  Procedure Laterality Date  . ABDOMINAL HYSTERECTOMY Bilateral 08/22/2015   Procedure: HYSTERECTOMY ABDOMINAL, BILATERAL SALPINGECTOMY;  Surgeon: Everlene Farrier, MD;  Location: [facility];  Service: Gynecology;  Laterality: Bilateral;  . MASTECTOMY Left 07/2018  . MASTECTOMY WITH AXILLARY LYMPH NODE DISSECTION Left 08/30/2018   Procedure: Left mastectomy with axillary lymph node dissection;  Surgeon: Rolm Bookbinder, MD;  Location: [facility];  Service: General;  Laterality: Left;  . MYOMECTOMY ABDOMINAL APPROACH    . PORTACATH PLACEMENT N/A 03/30/2018   Procedure: INSERTION PORT-A-CATH WITH [place];  Surgeon: Rolm Bookbinder, MD;  Location: [facility];  Service: General;  Laterality: N/A;    There were no vitals filed for this visit.   Subjective Assessment - 04/06/19 1605    Subjective  I feel really heavy over here. (pointing upper arm and into the  chest).  Uncomfortable.    Pertinent History  S/p Lt modified radical mastectomy on 08/30/18 with Dr. [name].  SLNB x 12 lymph nodes all negative. Chemo and radiation completed. History of migraines.    Patient Stated Goals  improve the left side    Currently in Pain?  Yes    Pain Score  2     Pain Location  Arm    Pain Orientation  Left    Pain Descriptors / Indicators  Aching    Pain Type  Chronic pain    Pain Onset  More than a month ago    Pain Frequency  Constant    Aggravating Factors   its just there    Pain Relieving Factors  nothing really         [facility] PT Assessment - 04/06/19 0001      Assessment   Medical Diagnosis  lt mastectomy     Referring Provider (PT)  Dr. Jana Hakim    Onset Date/Surgical Date  08/30/18    Hand Dominance  Right      Precautions   Precaution Comments  lymphedema, cancer history      Restrictions   Weight Bearing Restrictions  No      Balance Screen   Has the patient fallen in the past 6 months  No    Has the patient had a decrease in activity level  because of a fear of falling?   No    Is the patient reluctant to leave their home because of a fear of falling?   No      Home Film/video editor residence    Living Arrangements  Spouse/significant other      Prior Function   Level of Independence  Independent    Vocation  Full time employment    Vocation Requirements  desk; therapist    Leisure  walking a bit need to do more      Cognition   Overall Cognitive Status  Within Functional Limits for tasks assessed      Observation/Other Assessments   Observations  well healed mastectomy incision with radiation evident by skin darkening.  some puckering present medial and lateral incision but no significant incision restrictions      Sensation   Light Touch  Appears Intact      Coordination   Gross Motor Movements are Fluid and Coordinated  Yes      Posture/Postural Control   Posture/Postural Control   Postural limitations    Postural Limitations  Rounded Shoulders;Forward head      AROM   Right Shoulder Horizontal ABduction  29 Degrees    Left Shoulder Flexion  147 Degrees    Left Shoulder ABduction  145 Degrees   pull   Left Shoulder External Rotation  80 Degrees    Left Shoulder Horizontal ABduction  16 Degrees      Flexibility   Soft Tissue Assessment /Muscle Length  yes   pectoralis tight in D2 position supine       LYMPHEDEMA/ONCOLOGY QUESTIONNAIRE - 04/06/19 1611      Type   Cancer Type  Left breast cancer      Surgeries   Mastectomy Date  08/30/18    Sentinel Lymph Node Biopsy Date  08/30/18    Number Lymph Nodes Removed  12      Treatment   Active Chemotherapy Treatment  No    Past Chemotherapy Treatment  Yes    Active Radiation Treatment  No    Past Radiation Treatment  Yes    Current Hormone Treatment  No    Past Hormone Therapy  No      What other symptoms do you have   Are you Having Heaviness or Tightness  Yes    Are you having Pain  Yes      Right Upper Extremity Lymphedema   At Axilla   40 cm    15 cm Proximal to Olecranon Process  35.8 cm    10 cm Proximal to Olecranon Process  33.7 cm    Olecranon Process  27.5 cm    15 cm Proximal to Ulnar Styloid Process  25.5 cm    10 cm Proximal to Ulnar Styloid Process  20.5 cm    Just Proximal to Ulnar Styloid Process  16.1 cm    Across Hand at [employer]  18.6 cm    At [place] of 2nd Digit  6 cm    Other  2294      Left Upper Extremity Lymphedema   At Axilla   40 cm    15 cm Proximal to Olecranon Process  36.3 cm    10 cm Proximal to Olecranon Process  35.2 cm    Olecranon Process  27 cm    15 cm Proximal to Ulnar Styloid Process  23.8 cm    10 cm Proximal  to Ulnar Styloid Process  20.3 cm    Just Proximal to Ulnar Styloid Process  16.2 cm    Across Hand at [employer]  18.7 cm    At [place] of 2nd Digit  5.9 cm    Other  10.5    Other  2282          Quick Dash - 04/06/19 0001     Open a tight or new jar  Mild difficulty    Do heavy household chores (wash walls, wash floors)  No difficulty    Carry a shopping bag or briefcase  Mild difficulty    Wash your back  Mild difficulty    Use a knife to cut food  No difficulty    Recreational activities in which you take some force or impact through your arm, shoulder, or hand (golf, hammering, tennis)  No difficulty    During the past week, to what extent has your arm, shoulder or hand problem interfered with your normal social activities with family, friends, neighbors, or groups?  Slightly    During the past week, to what extent has your arm, shoulder or hand problem limited your work or other regular daily activities  Slightly    Arm, shoulder, or hand pain.  Moderate    Tingling (pins and needles) in your arm, shoulder, or hand  None    Difficulty Sleeping  Mild difficulty    DASH Score  18.18 %        Objective measurements completed on examination: See above findings.              PT Education - 04/06/19 1653    Education Details  HEP update, lymphedema and compression    Person(s) Educated  Patient    Methods  Explanation;Demonstration;Tactile cues;Verbal cues;Handout    Comprehension  Verbalized understanding;Returned demonstration;Verbal cues required;Tactile cues required          PT Long Term Goals - 04/06/19 1700      PT LONG TERM GOAL #1   Title  Pt will obtain appropriate compression for the left UE with instruction on use    Time  6    Period  Weeks    Status  New      PT LONG TERM GOAL #2   Title  Pt will increase Lt shoulder horizontal abduction to at least 29 deg    Baseline  16 on 04/06/19    Time  6    Period  Weeks    Status  New      PT LONG TERM GOAL #3   Title  Pt will report only intermittent tightness in the left upper quadrant    Time  6    Period  Weeks    Status  New      PT LONG TERM GOAL #4   Title  Pt will be ind with final HEP    Time  6    Period  Weeks     Status  New             Plan - 04/06/19 1653    Clinical Impression Statement  Pt returns to therapy here post radiation with feelings of just being tight in the left upper quadrant.  Lymphedema vs muscle restriction hard to differentiate today.  Only 22ml difference per volume measure calcuations but with mild difference upper two upper arm locations.  pt does have soft tissue tightness in the pectoralis, latissimus, and scapular muscles  that may be contributing to the tightness vs any lymphedema.  Due to numbner of lymph nodes removed and radiation history we will proceed with compression and teach MLD per pt request.    Personal Factors and Comorbidities  Comorbidity 2    Comorbidities  node removal, radiation    Examination-Activity Limitations  Lift;Carry;Reach Overhead    Examination-Participation Restrictions  Cleaning;Community Activity;Yard Work    Public affairs consultant  Low    Rehab Potential  Excellent    PT Frequency  2x / week    PT Duration  6 weeks    PT Treatment/Interventions  ADLs/Self Care Home Management;Manual techniques;Therapeutic exercise;Taping;Therapeutic activities;Patient/family education;Manual lymph drainage;Passive range of motion    PT Next Visit Plan  review stretches, get measured for sleeve?, show and begin to teach pt self MLD for the Lt UE, give links.  vs start left Upper quadrant PROM and STM    PT Home Exercise Plan  Access Code: [email or web address]    Consulted and Agree with Plan of Care  Patient       Patient will benefit from skilled therapeutic intervention in order to improve the following deficits and impairments:  Decreased skin integrity, Decreased range of motion, Decreased scar mobility, Decreased activity tolerance, Decreased knowledge of use of DME, Decreased strength, Impaired UE functional use, Pain, Increased edema  Visit Diagnosis: Malignant neoplasm of upper-outer  quadrant of left breast in female, estrogen receptor negative (HCC)  Abnormal posture  Stiffness of left shoulder, not elsewhere classified  Lymphedema     Problem List Patient Active Problem List   Diagnosis Date Noted  . S/P mastectomy, left 08/30/2018  . 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 Bronchiectasis ([place]) 07/21/2018  . Genetic testing 05/03/2018  . Port-A-Cath in place 04/07/2018  . Malignant neoplasm of upper-outer quadrant of left breast in female, estrogen receptor negative ([place]) 03/21/2018  . Migraine 11/21/2015  . Fibroids 08/22/2015    Stark Bray 04/06/2019, 5:06 PM  [place] , [place], [address] Phone: [phone]   Fax:  [phone]  Name: Tyiesha Filho MRN: [id number] Date of Birth: [birth year]

## 2019-04-06 NOTE — Patient Instructions (Signed)
Access Code: [email or web address]  URL: [email or web address]  Date: 04/06/2019  Prepared by: Shan Levans   Exercises  Supine Chest Stretch with Elbows Bent - 3 reps - 1 sets - 30-60seconds hold - 1x daily - 7x weekly  Sidelying Thoracic Rotation with Open Book - 10 reps - 1-3 sets - 10-20 seconds hold - 1x daily - 7x weekly  Doorway Pec Stretch at 90 Degrees Abduction - 3 reps - 1-3 sets - 20-30 seconds hold - 1x daily - 7x weekly  Standing Shoulder Posterior Capsule Stretch - 3 reps - 1-3 sets - 20-30 seconds hold - 1x daily - 7x weekly

## 2019-04-10 ENCOUNTER — Ambulatory Visit: Payer: BC Managed Care – PPO

## 2019-04-10 ENCOUNTER — Telehealth: Payer: Self-pay | Admitting: *Deleted

## 2019-04-10 ENCOUNTER — Other Ambulatory Visit: Payer: Self-pay

## 2019-04-10 DIAGNOSIS — C50412 Malignant neoplasm of upper-outer quadrant of left female breast: Secondary | ICD-10-CM

## 2019-04-10 DIAGNOSIS — M25612 Stiffness of left shoulder, not elsewhere classified: Secondary | ICD-10-CM

## 2019-04-10 DIAGNOSIS — I890 Lymphedema, not elsewhere classified: Secondary | ICD-10-CM

## 2019-04-10 DIAGNOSIS — R293 Abnormal posture: Secondary | ICD-10-CM

## 2019-04-10 DIAGNOSIS — Z171 Estrogen receptor negative status [ER-]: Secondary | ICD-10-CM

## 2019-04-10 DIAGNOSIS — M25512 Pain in left shoulder: Secondary | ICD-10-CM

## 2019-04-10 NOTE — Therapy (Signed)
[place], [place], [address] Phone: [phone]   Fax:  [phone]  Physical Therapy Treatment  Patient Details  Name: Paula Berry MRN: [id number] Date of Birth: [birth year] Referring Provider (PT): Dr. Jana Hakim   Encounter Date: 04/10/2019  PT End of Session - 04/10/19 1605    Visit Number  2    Number of Visits  11    Date for PT Re-Evaluation  05/18/19    PT Start Time  1503    PT Stop Time  1602    PT Time Calculation (min)  59 min    Activity Tolerance  Patient tolerated treatment well    Behavior During Therapy  [facility] for tasks assessed/performed       Past Medical History:  Diagnosis Date  . Anemia    had iron infusion 08/12/15  . Asthma   . Breast cancer ([place]) 03/2018   Left Breast Cancer  . Cancer [facility])    Breast cancer   . Genital herpes   . GERD (gastroesophageal reflux disease)   . Headache    migraines 2-3 times monthly  . Pneumonia    hospitalizied for pneumonia as a child    Past Surgical History:  Procedure Laterality Date  . ABDOMINAL HYSTERECTOMY Bilateral 08/22/2015   Procedure: HYSTERECTOMY ABDOMINAL, BILATERAL SALPINGECTOMY;  Surgeon: Everlene Farrier, MD;  Location: [facility];  Service: Gynecology;  Laterality: Bilateral;  . MASTECTOMY Left 07/2018  . MASTECTOMY WITH AXILLARY LYMPH NODE DISSECTION Left 08/30/2018   Procedure: Left mastectomy with axillary lymph node dissection;  Surgeon: Rolm Bookbinder, MD;  Location: [facility];  Service: General;  Laterality: Left;  . MYOMECTOMY ABDOMINAL APPROACH    . PORTACATH PLACEMENT N/A 03/30/2018   Procedure: INSERTION PORT-A-CATH WITH [place];  Surgeon: Rolm Bookbinder, MD;  Location: [place];  Service: General;  Laterality: N/A;    There were no vitals filed for this visit.  Subjective Assessment - 04/10/19 1508    Subjective  I am just ready for my Lt shoulder better.    Pertinent History  S/p  Lt modified radical mastectomy on 08/30/18 with Dr. [name].  SLNB x 12 lymph nodes all negative. Chemo and radiation completed. History of migraines.    Patient Stated Goals  improve the left side    Currently in Pain?  Yes    Pain Score  7     Pain Location  Scapula    Pain Orientation  Left    Pain Descriptors / Indicators  Sore    Pain Type  Chronic pain    Pain Onset  More than a month ago    Pain Frequency  Constant    Aggravating Factors   it's just always there    Pain Relieving Factors  Tylenol                       [facility] Adult PT Treatment/Exercise - 04/10/19 0001      Manual Therapy   Manual Therapy  Soft tissue mobilization;Myofascial release;Scapular mobilization;Passive ROM    Soft tissue mobilization  To Lt shoulder and periscapular region, and posterior axilla/lattissimus area    Myofascial Release  To Lt axilla during P/ROM    Scapular Mobilization  With pt in Rt S/L for Lt scapular mobs into retraction and protraction, prolonged holds into retraction and trying to pull scapula away from ribs though this was tight, and into depression    Passive ROM  In Supine to Lt shoulder into flexion, abduction and D2 to pts tolerance                  PT Long Term Goals - 04/06/19 1700      PT LONG TERM GOAL #1   Title  Pt will obtain appropriate compression for the left UE with instruction on use    Time  6    Period  Weeks    Status  New      PT LONG TERM GOAL #2   Title  Pt will increase Lt shoulder horizontal abduction to at least 29 deg    Baseline  16 on 04/06/19    Time  6    Period  Weeks    Status  New      PT LONG TERM GOAL #3   Title  Pt will report only intermittent tightness in the left upper quadrant    Time  6    Period  Weeks    Status  New      PT LONG TERM GOAL #4   Title  Pt will be ind with final HEP    Time  6    Period  Weeks    Status  New            Plan - 04/10/19 1605    Clinical Impression Statement   Pts first session of treatment today. Focused today on soft tissue mobs, scapular mobs and promoting end ROM stretching. Pt reports feeling good relief after with no increased pain. She tolerated pressure of trigger point release well at varying areas today of soft tissue. Pt was measured for a compression sleeve today.    Personal Factors and Comorbidities  Comorbidity 2    Comorbidities  node removal, radiation    Examination-Activity Limitations  Lift;Carry;Reach Overhead    Examination-Participation Restrictions  Cleaning;Community Activity;Yard Work    [employer]  Evolving/Moderate complexity    Rehab Potential  Excellent    PT Frequency  2x / week    PT Duration  6 weeks    PT Treatment/Interventions  ADLs/Self Care Home Management;Manual techniques;Therapeutic exercise;Taping;Therapeutic activities;Patient/family education;Manual lymph drainage;Passive range of motion    PT Next Visit Plan  review stretches, cont and assess tolerance of left Upper quadrant PROM and STM; show and begin to teach pt self MLD for the Lt UE, give links.    PT Home Exercise Plan  Access Code: [email or web address]    Consulted and Agree with Plan of Care  Patient       Patient will benefit from skilled therapeutic intervention in order to improve the following deficits and impairments:  Decreased skin integrity, Decreased range of motion, Decreased scar mobility, Decreased activity tolerance, Decreased knowledge of use of DME, Decreased strength, Impaired UE functional use, Pain, Increased edema  Visit Diagnosis: Malignant neoplasm of upper-outer quadrant of left breast in female, estrogen receptor negative (HCC)  Abnormal posture  Stiffness of left shoulder, not elsewhere classified  Lymphedema     Problem List Patient Active Problem List   Diagnosis Date Noted  . S/P mastectomy, left 08/30/2018  . 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 Bronchiectasis ([place]) 07/21/2018  . Genetic testing  05/03/2018  . Port-A-Cath in place 04/07/2018  . Malignant neoplasm of upper-outer quadrant of left breast in female, estrogen receptor negative ([place]) 03/21/2018  . Migraine 11/21/2015  . Fibroids 08/22/2015    Otelia Limes, PTA 04/10/2019, 4:10 PM  [facility]  [place], [place], [address] Phone: [phone]   Fax:  [phone]  Name: Paula Berry MRN: [id number] Date of Birth: [birth year]

## 2019-04-10 NOTE — Telephone Encounter (Signed)
This RN spoke with pt per call regarding ongoing pain now increasing in her left shoulder.   She had discussed above with MD- who ordered PT- she is scheduled today for 1st therapy session.  Note her concern is since seeing MD pain " is worse "  She has taken a muscle relaxant without benefit and tylenol which is helping.  Note Paula Berry has had this discomfort prior to breast cancer diagnosis but it is increasing in discomfort.  She states she has " good range of motion but it just hurts "  Above discussed with plan for pt to proceed with PT- as well order for plain films of her shoulder can be done this week.  Paula Berry verbalized understanding of above plan.

## 2019-04-13 ENCOUNTER — Ambulatory Visit: Payer: BC Managed Care – PPO | Admitting: Physical Therapy

## 2019-04-13 ENCOUNTER — Other Ambulatory Visit: Payer: Self-pay

## 2019-04-13 ENCOUNTER — Encounter: Payer: Self-pay | Admitting: Physical Therapy

## 2019-04-13 DIAGNOSIS — R293 Abnormal posture: Secondary | ICD-10-CM

## 2019-04-13 DIAGNOSIS — I890 Lymphedema, not elsewhere classified: Secondary | ICD-10-CM

## 2019-04-13 DIAGNOSIS — M25612 Stiffness of left shoulder, not elsewhere classified: Secondary | ICD-10-CM

## 2019-04-13 DIAGNOSIS — C50412 Malignant neoplasm of upper-outer quadrant of left female breast: Secondary | ICD-10-CM | POA: Diagnosis not present

## 2019-04-13 DIAGNOSIS — M25512 Pain in left shoulder: Secondary | ICD-10-CM

## 2019-04-13 NOTE — Therapy (Signed)
[place], [place], [address] Phone: [phone]   Fax:  [phone]  Physical Therapy Treatment  Patient Details  Name: Paula Berry MRN: [id number] Date of Birth: [birth year] Referring Provider (PT): Dr. Jana Hakim   Encounter Date: 04/13/2019  PT End of Session - 04/13/19 1707    Visit Number  3    Number of Visits  11    Date for PT Re-Evaluation  05/18/19    PT Start Time  [id number]    PT Stop Time  1555    PT Time Calculation (min)  40 min    Activity Tolerance  Patient tolerated treatment well    Behavior During Therapy  [facility] for tasks assessed/performed       Past Medical History:  Diagnosis Date  . Anemia    had iron infusion 08/12/15  . Asthma   . Breast cancer ([place]) 03/2018   Left Breast Cancer  . Cancer [facility])    Breast cancer   . Genital herpes   . GERD (gastroesophageal reflux disease)   . Headache    migraines 2-3 times monthly  . Pneumonia    hospitalizied for pneumonia as a child    Past Surgical History:  Procedure Laterality Date  . ABDOMINAL HYSTERECTOMY Bilateral 08/22/2015   Procedure: HYSTERECTOMY ABDOMINAL, BILATERAL SALPINGECTOMY;  Surgeon: Everlene Farrier, MD;  Location: [facility];  Service: Gynecology;  Laterality: Bilateral;  . MASTECTOMY Left 07/2018  . MASTECTOMY WITH AXILLARY LYMPH NODE DISSECTION Left 08/30/2018   Procedure: Left mastectomy with axillary lymph node dissection;  Surgeon: Rolm Bookbinder, MD;  Location: [facility];  Service: General;  Laterality: Left;  . MYOMECTOMY ABDOMINAL APPROACH    . PORTACATH PLACEMENT N/A 03/30/2018   Procedure: INSERTION PORT-A-CATH WITH [place];  Surgeon: Rolm Bookbinder, MD;  Location: [facility];  Service: General;  Laterality: N/A;    There were no vitals filed for this visit.  Subjective Assessment - 04/13/19 1705    Subjective  Pt got some relief so far with PT, Today she feels achy and has a  headache    Pertinent History  S/p Lt modified radical mastectomy on 08/30/18 with Dr. [name].  SLNB x 12 lymph nodes all negative. Chemo and radiation completed. History of migraines.    Currently in Pain?  Yes    Pain Score  6     Pain Location  Scapula    Pain Orientation  Left    Pain Descriptors / Indicators  Aching;Nagging    Pain Type  Chronic pain    Pain Onset  More than a month ago    Pain Frequency  Constant                       [facility] Adult PT Treatment/Exercise - 04/13/19 0001      Manual Therapy   Manual Therapy  Soft tissue mobilization;Myofascial release;Scapular mobilization;Passive ROM    Soft tissue mobilization  with pt is sidleying To Lt shoulder and periscapular region, and posterior axilla/lattissimus area with extra pressure on tender trigger points     Scapular Mobilization  With pt in Rt S/L for Lt scapular mobs into retraction and protraction, prolonged holds into retraction and trying to pull scapula away from ribs though this was tight, and into depression    Passive ROM  In Supine to Lt shoulder into flexion, abduction and D2 to pts tolerance  PT Long Term Goals - 04/06/19 1700      PT LONG TERM GOAL #1   Title  Pt will obtain appropriate compression for the left UE with instruction on use    Time  6    Period  Weeks    Status  New      PT LONG TERM GOAL #2   Title  Pt will increase Lt shoulder horizontal abduction to at least 29 deg    Baseline  16 on 04/06/19    Time  6    Period  Weeks    Status  New      PT LONG TERM GOAL #3   Title  Pt will report only intermittent tightness in the left upper quadrant    Time  6    Period  Weeks    Status  New      PT LONG TERM GOAL #4   Title  Pt will be ind with final HEP    Time  6    Period  Weeks    Status  New            Plan - 04/13/19 1707    Clinical Impression Statement  Pt reported that she felt much better at end of session,Encouraged pt  to continue stretches she has been doing    Comorbidities  node removal, radiation    Examination-Activity Limitations  Lift;Carry;Reach Overhead    Examination-Participation Restrictions  Cleaning;Community Activity;Yard Work    PT Frequency  2x / week    PT Duration  6 weeks    PT Treatment/Interventions  ADLs/Self Care Home Management;Manual techniques;Therapeutic exercise;Taping;Therapeutic activities;Patient/family education;Manual lymph drainage;Passive range of motion    PT Next Visit Plan  review stretches, cont and assess tolerance of left Upper quadrant PROM and STM; show and begin to teach pt self MLD for the Lt UE, give links. Teach strength ABC prior to discharge    Consulted and Agree with Plan of Care  Patient       Patient will benefit from skilled therapeutic intervention in order to improve the following deficits and impairments:  Decreased skin integrity, Decreased range of motion, Decreased scar mobility, Decreased activity tolerance, Decreased knowledge of use of DME, Decreased strength, Impaired UE functional use, Pain, Increased edema  Visit Diagnosis: Malignant neoplasm of upper-outer quadrant of left breast in female, estrogen receptor negative (HCC)  Abnormal posture  Stiffness of left shoulder, not elsewhere classified  Lymphedema  Acute pain of left shoulder     Problem List Patient Active Problem List   Diagnosis Date Noted  . S/P mastectomy, left 08/30/2018  . 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 Bronchiectasis ([place]) 07/21/2018  . Genetic testing 05/03/2018  . Port-A-Cath in place 04/07/2018  . Malignant neoplasm of upper-outer quadrant of left breast in female, estrogen receptor negative ([place]) 03/21/2018  . Migraine 11/21/2015  . Fibroids 08/22/2015   Donato Heinz. Owens Shark PT  Norwood Levo 04/13/2019, 5:09 PM  [place], [place], [address] Phone: [phone]   Fax:   [phone]  Name: Paula Berry MRN: [id number] Date of Birth: 04/03/1971

## 2019-04-17 ENCOUNTER — Other Ambulatory Visit: Payer: Self-pay

## 2019-04-17 ENCOUNTER — Ambulatory Visit: Payer: BC Managed Care – PPO

## 2019-04-17 DIAGNOSIS — C50412 Malignant neoplasm of upper-outer quadrant of left female breast: Secondary | ICD-10-CM | POA: Diagnosis not present

## 2019-04-17 DIAGNOSIS — M25612 Stiffness of left shoulder, not elsewhere classified: Secondary | ICD-10-CM

## 2019-04-17 DIAGNOSIS — M25512 Pain in left shoulder: Secondary | ICD-10-CM

## 2019-04-17 DIAGNOSIS — Z171 Estrogen receptor negative status [ER-]: Secondary | ICD-10-CM

## 2019-04-17 DIAGNOSIS — R293 Abnormal posture: Secondary | ICD-10-CM

## 2019-04-17 DIAGNOSIS — I890 Lymphedema, not elsewhere classified: Secondary | ICD-10-CM

## 2019-04-17 NOTE — Therapy (Signed)
[place], [place], [address] Phone: [phone]   Fax:  [phone]  Physical Therapy Treatment  Patient Details  Name: Paula Berry MRN: [id number] Date of Birth: [birth year] Referring Provider (PT): Dr. Jana Hakim   Encounter Date: 04/17/2019  PT End of Session - 04/17/19 1710    Visit Number  4    Number of Visits  11    Date for PT Re-Evaluation  05/18/19    PT Start Time  1606    PT Stop Time  1700    PT Time Calculation (min)  54 min    Activity Tolerance  Patient tolerated treatment well    Behavior During Therapy  [facility] for tasks assessed/performed       Past Medical History:  Diagnosis Date  . Anemia    had iron infusion 08/12/15  . Asthma   . Breast cancer ([place]) 03/2018   Left Breast Cancer  . Cancer [facility])    Breast cancer   . Genital herpes   . GERD (gastroesophageal reflux disease)   . Headache    migraines 2-3 times monthly  . Pneumonia    hospitalizied for pneumonia as a child    Past Surgical History:  Procedure Laterality Date  . ABDOMINAL HYSTERECTOMY Bilateral 08/22/2015   Procedure: HYSTERECTOMY ABDOMINAL, BILATERAL SALPINGECTOMY;  Surgeon: Everlene Farrier, MD;  Location: [facility];  Service: Gynecology;  Laterality: Bilateral;  . MASTECTOMY Left 07/2018  . MASTECTOMY WITH AXILLARY LYMPH NODE DISSECTION Left 08/30/2018   Procedure: Left mastectomy with axillary lymph node dissection;  Surgeon: Rolm Bookbinder, MD;  Location: [facility];  Service: General;  Laterality: Left;  . MYOMECTOMY ABDOMINAL APPROACH    . PORTACATH PLACEMENT N/A 03/30/2018   Procedure: INSERTION PORT-A-CATH WITH [place];  Surgeon: Rolm Bookbinder, MD;  Location: [facility];  Service: General;  Laterality: N/A;    There were no vitals filed for this visit.  Subjective Assessment - 04/17/19 1608    Subjective  I've been doing the exercises about 3-4x/day. My pain is better and  my shoulder isn't as uncomfortable. I can tell I'm moving my Lt shoulder better as well.    Pertinent History  S/p Lt modified radical mastectomy on 08/30/18 with Dr. [name].  SLNB x 12 lymph nodes all negative. Chemo and radiation completed. History of migraines.    Patient Stated Goals  improve the left side    Currently in Pain?  Yes    Pain Score  4     Pain Location  Scapula    Pain Orientation  Left    Pain Descriptors / Indicators  Aching;Nagging   not as persistent   Pain Type  Chronic pain    Pain Onset  More than a month ago    Pain Frequency  Constant    Aggravating Factors   still always there, but not as persistent    Pain Relieving Factors  Tylenol, though taking this less now, and HEP is helping                       [facility] Adult PT Treatment/Exercise - 04/17/19 0001      Manual Therapy   Manual Therapy  Soft tissue mobilization;Myofascial release;Scapular mobilization;Passive ROM;Manual Lymphatic Drainage (MLD)    Soft tissue mobilization  with pt in Rt sidleying To Lt shoulder and periscapular region, and posterior axilla/lattissimus area with extra pressure on tender trigger points  Myofascial Release  To Lt axilla during P/ROM    Scapular Mobilization  With pt in Rt S/L for Lt scapular mobs into retraction and protraction, prolonged holds into retraction and trying to pull scapula away from ribs though this was tight, and into depression    Manual Lymphatic Drainage (MLD)  In Supine: Short neck, 5 diaphragmatic breaths, Lt inguinal and Rt axillary nodes, Lt axillo-inguinal and anterior inter-axillary, and then focused on Lt UE from lateral upper arm to dorsal hand working from proximal to distal then retracing all steps beginning to instruct pt throughout.    Passive ROM  In Supine to Lt shoulder into flexion, abduction and D2 to pts tolerance                  PT Long Term Goals - 04/06/19 1700      PT LONG TERM GOAL #1   Title  Pt will  obtain appropriate compression for the left UE with instruction on use    Time  6    Period  Weeks    Status  New      PT LONG TERM GOAL #2   Title  Pt will increase Lt shoulder horizontal abduction to at least 29 deg    Baseline  16 on 04/06/19    Time  6    Period  Weeks    Status  New      PT LONG TERM GOAL #3   Title  Pt will report only intermittent tightness in the left upper quadrant    Time  6    Period  Weeks    Status  New      PT LONG TERM GOAL #4   Title  Pt will be ind with final HEP    Time  6    Period  Weeks    Status  New            Plan - 04/17/19 1711    Clinical Impression Statement  Continued with focus on manual therapy working to decrease tissue tightness at Lt lateral trunk near inferior and posterior axilla. Good improvement noted with scapular mobs today and pt reported this feeling less tight as well. Began and instructed her in [place] lymph drainage of Lt UE today. Pt will need further review and handout at next session, having her return demo. This was instructed as pt, at times, reports heaviness in arm and had ALND so is at a higher risk for lymphedema.    Personal Factors and Comorbidities  Comorbidity 2    Comorbidities  node removal, radiation    Examination-Activity Limitations  Lift;Carry;Reach Overhead    Examination-Participation Restrictions  Cleaning;Community Activity;Yard Work    [employer] officer  Evolving/Moderate complexity    Rehab Potential  Excellent    PT Frequency  2x / week    PT Duration  6 weeks    PT Treatment/Interventions  ADLs/Self Care Home Management;Manual techniques;Therapeutic exercise;Taping;Therapeutic activities;Patient/family education;Manual lymph drainage;Passive range of motion    PT Next Visit Plan  review stretches, instruct in supine scapuarl series, cont and assess tolerance of left Upper quadrant PROM and STM; cont and review self MLD for the Lt UE issuing handout. Teach strength ABC  prior to discharge    PT Home Exercise Plan  --    Consulted and Agree with Plan of Care  Patient       Patient will benefit from skilled therapeutic intervention in order to improve the following deficits  and impairments:  Decreased skin integrity, Decreased range of motion, Decreased scar mobility, Decreased activity tolerance, Decreased knowledge of use of DME, Decreased strength, Impaired UE functional use, Pain, Increased edema  Visit Diagnosis: Malignant neoplasm of upper-outer quadrant of left breast in female, estrogen receptor negative (HCC)  Abnormal posture  Stiffness of left shoulder, not elsewhere classified  Lymphedema  Acute pain of left shoulder     Problem List Patient Active Problem List   Diagnosis Date Noted  . S/P mastectomy, left 08/30/2018  . 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 Bronchiectasis ([place]) 07/21/2018  . Genetic testing 05/03/2018  . Port-A-Cath in place 04/07/2018  . Malignant neoplasm of upper-outer quadrant of left breast in female, estrogen receptor negative ([place]) 03/21/2018  . Migraine 11/21/2015  . Fibroids 08/22/2015    Otelia Limes, PTA 04/17/2019, 5:19 PM  [place], [place], [address] Phone: [phone]   Fax:  [phone]  Name: Paula Berry MRN: [id number] Date of Birth: 20-Feb-1971

## 2019-04-20 ENCOUNTER — Other Ambulatory Visit: Payer: Self-pay

## 2019-04-20 ENCOUNTER — Encounter: Payer: Self-pay | Admitting: Rehabilitation

## 2019-04-20 ENCOUNTER — Ambulatory Visit: Payer: BC Managed Care – PPO | Admitting: Rehabilitation

## 2019-04-20 DIAGNOSIS — C50412 Malignant neoplasm of upper-outer quadrant of left female breast: Secondary | ICD-10-CM

## 2019-04-20 DIAGNOSIS — I890 Lymphedema, not elsewhere classified: Secondary | ICD-10-CM

## 2019-04-20 DIAGNOSIS — M25612 Stiffness of left shoulder, not elsewhere classified: Secondary | ICD-10-CM

## 2019-04-20 DIAGNOSIS — M25512 Pain in left shoulder: Secondary | ICD-10-CM

## 2019-04-20 DIAGNOSIS — R293 Abnormal posture: Secondary | ICD-10-CM

## 2019-04-20 NOTE — Therapy (Signed)
[place], [place], [address] Phone: [phone]   Fax:  [phone]  Physical Therapy Treatment  Patient Details  Name: Zixuan Salkowski MRN: [id number] Date of Birth: [birth year] Referring Provider (PT): Dr. Jana Hakim   Encounter Date: 04/20/2019    Past Medical History:  Diagnosis Date  . Anemia    had iron infusion 08/12/15  . Asthma   . Breast cancer ([place]) 03/2018   Left Breast Cancer  . Cancer [facility])    Breast cancer   . Genital herpes   . GERD (gastroesophageal reflux disease)   . Headache    migraines 2-3 times monthly  . Pneumonia    hospitalizied for pneumonia as a child    Past Surgical History:  Procedure Laterality Date  . ABDOMINAL HYSTERECTOMY Bilateral 08/22/2015   Procedure: HYSTERECTOMY ABDOMINAL, BILATERAL SALPINGECTOMY;  Surgeon: Everlene Farrier, MD;  Location: [facility];  Service: Gynecology;  Laterality: Bilateral;  . MASTECTOMY Left 07/2018  . MASTECTOMY WITH AXILLARY LYMPH NODE DISSECTION Left 08/30/2018   Procedure: Left mastectomy with axillary lymph node dissection;  Surgeon: Rolm Bookbinder, MD;  Location: [facility];  Service: General;  Laterality: Left;  . MYOMECTOMY ABDOMINAL APPROACH    . PORTACATH PLACEMENT N/A 03/30/2018   Procedure: INSERTION PORT-A-CATH WITH [place];  Surgeon: Rolm Bookbinder, MD;  Location: [facility];  Service: General;  Laterality: N/A;    There were no vitals filed for this visit.  Subjective Assessment - 04/20/19 1602    Subjective  Its going    Pertinent History  S/p Lt modified radical mastectomy on 08/30/18 with Dr. [name].  SLNB x 12 lymph nodes all negative. Chemo and radiation completed. History of migraines.    Patient Stated Goals  improve the left side    Currently in Pain?  Yes    Pain Score  3     Pain Location  Scapula    Pain Orientation  Left    Pain Descriptors / Indicators  Aching;Burning    Pain  Type  Chronic pain    Pain Onset  More than a month ago    Pain Frequency  Constant                                    PT Long Term Goals - 04/06/19 1700      PT LONG TERM GOAL #1   Title  Pt will obtain appropriate compression for the left UE with instruction on use    Time  6    Period  Weeks    Status  New      PT LONG TERM GOAL #2   Title  Pt will increase Lt shoulder horizontal abduction to at least 29 deg    Baseline  16 on 04/06/19    Time  6    Period  Weeks    Status  New      PT LONG TERM GOAL #3   Title  Pt will report only intermittent tightness in the left upper quadrant    Time  6    Period  Weeks    Status  New      PT LONG TERM GOAL #4   Title  Pt will be ind with final HEP    Time  6    Period  Weeks    Status  New  Patient will benefit from skilled therapeutic intervention in order to improve the following deficits and impairments:     Visit Diagnosis: No diagnosis found.     Problem List Patient Active Problem List   Diagnosis Date Noted  . S/P mastectomy, left 08/30/2018  . 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 Bronchiectasis ([place]) 07/21/2018  . Genetic testing 05/03/2018  . Port-A-Cath in place 04/07/2018  . Malignant neoplasm of upper-outer quadrant of left breast in female, estrogen receptor negative ([place]) 03/21/2018  . Migraine 11/21/2015  . Fibroids 08/22/2015    Stark Bray 04/20/2019, 4:06 PM  [place], [place], [address] Phone: [phone]   Fax:  [phone]  Name: Malynda Lumbreras MRN: [id number] Date of Birth: [birth year]

## 2019-04-20 NOTE — Patient Instructions (Signed)
Deep Effective Breath   1.) Standing, sitting, or laying down, place both hands on the belly. Take a deep breath IN, expanding the belly; then breath OUT, contracting the belly.   Repeat __5__ times.    2.) On the right side make 5 circles in the armpit using the left hand  3.), then pump _5__ times from left  armpit across chest to the right armpit, making a pathway.  Axilla to Inguinal Nodes - Sweep   4.) On involved side, make 5 circles at groin at panty line on the left side  5.), then pump _5__ times from the left armpit along side of trunk to outer hip, making your other pathway.   Do __1_ time per day.  Copyright  VHI. All rights reserved.  Arm Posterior: Elbow to Shoulder - Sweep   6.) Pump _5__ times from back of elbow to top of shoulder.   7.) Then inner to outer upper arm _5_ times, then outer arm again _5_ times.    ARM: Volar Wrist to Elbow - Sweep   8.)Pump or stationary circles _5__ times from wrist to elbow making sure to do both sides of the forearm.   Copyright  VHI. All rights reserved.  ARM: Dorsum of Hand to Shoulder - Sweep   9.) Pump or stationary circles _5__ times on back of hand including knuckle spaces and individual fingers if needed working up towards the wrist,  10.) then retrace all your steps working back up the forearm, doing both sides; upper outer arm and back to your pathways _2-3_ times each. Then do 5 circles again at uninvolved armpit and involved groin where you started! Good job!! Do __1_ time per day.  Copyright  VHI. All rights reserved   How to access self MLD videos:  Web designer Videos:  Option 1:  [email or web address].com/resources/self-care-videos/  -Scroll down until you see "Self-MLD Upper Extremity"  -Watch the intro video until you feel comfortable with the material and then continue on to either the Right or the Left extremity   Option 2:  InternetActor.es.com/klose-training-self-manual-lymph-drainage-dvd  (The videos are in the Details section of the page)  -OR-  1. Go to SocialSpecialists.co.nz  2.  Scroll down until to you see:  Compression Guru Exclusive Web designer Lymphedema Management Videos  Watch Free   3. Click on "Watch Free"  4. Scroll down until you see the instructional videos  5. Start with the "Self MLD Intro" video  6.  To watch the next appropriate video you will have to make an account on the website. There is a video for Right Arm, Left Arm, Right Leg, and Left Leg    MD Ouida Sills Video: (This video incorporates the back with a Neurosurgeon)  [email or web address].is  -OR-  Enter "self MLD video MD Ouida Sills" into your search browser

## 2019-04-24 ENCOUNTER — Ambulatory Visit: Payer: BC Managed Care – PPO

## 2019-04-24 ENCOUNTER — Other Ambulatory Visit: Payer: Self-pay

## 2019-04-24 DIAGNOSIS — M25512 Pain in left shoulder: Secondary | ICD-10-CM

## 2019-04-24 DIAGNOSIS — C50412 Malignant neoplasm of upper-outer quadrant of left female breast: Secondary | ICD-10-CM | POA: Diagnosis not present

## 2019-04-24 DIAGNOSIS — R293 Abnormal posture: Secondary | ICD-10-CM

## 2019-04-24 DIAGNOSIS — Z171 Estrogen receptor negative status [ER-]: Secondary | ICD-10-CM

## 2019-04-24 DIAGNOSIS — M25612 Stiffness of left shoulder, not elsewhere classified: Secondary | ICD-10-CM

## 2019-04-24 DIAGNOSIS — I890 Lymphedema, not elsewhere classified: Secondary | ICD-10-CM

## 2019-04-24 NOTE — Therapy (Signed)
[name], [place], [address] Phone: [phone]   Fax:  [phone]  Physical Therapy Treatment  Patient Details  Name: Paula Berry MRN: [id number] Date of Birth: [birth year] Referring Provider (PT): Dr. Jana Hakim   Encounter Date: 04/24/2019  PT End of Session - 04/24/19 1707    Visit Number  6    Number of Visits  11    Date for PT Re-Evaluation  05/18/19    PT Start Time  7591    PT Stop Time  1703    PT Time Calculation (min)  54 min    Activity Tolerance  Patient tolerated treatment well    Behavior During Therapy  [facility] for tasks assessed/performed       Past Medical History:  Diagnosis Date  . Anemia    had iron infusion 08/12/15  . Asthma   . Breast cancer ([place]) 03/2018   Left Breast Cancer  . Cancer [facility])    Breast cancer   . Genital herpes   . GERD (gastroesophageal reflux disease)   . Headache    migraines 2-3 times monthly  . Pneumonia    hospitalizied for pneumonia as a child    Past Surgical History:  Procedure Laterality Date  . ABDOMINAL HYSTERECTOMY Bilateral 08/22/2015   Procedure: HYSTERECTOMY ABDOMINAL, BILATERAL SALPINGECTOMY;  Surgeon: Everlene Farrier, MD;  Location: [facility];  Service: Gynecology;  Laterality: Bilateral;  . MASTECTOMY Left 07/2018  . MASTECTOMY WITH AXILLARY LYMPH NODE DISSECTION Left 08/30/2018   Procedure: Left mastectomy with axillary lymph node dissection;  Surgeon: Rolm Bookbinder, MD;  Location: [place];  Service: General;  Laterality: Left;  . MYOMECTOMY ABDOMINAL APPROACH    . PORTACATH PLACEMENT N/A 03/30/2018   Procedure: INSERTION PORT-A-CATH WITH [place];  Surgeon: Rolm Bookbinder, MD;  Location: [place];  Service: General;  Laterality: N/A;    There were no vitals filed for this visit.  Subjective Assessment - 04/24/19 1621    Subjective  I'm still hanging out at a 3/10, which is way better than the 8/10 I  had been. I had to take some Tylenol yesterday and to sleep last night but it's been about 3 weeks since I have had to take it.    Pertinent History  S/p Lt modified radical mastectomy on 08/30/18 with Dr. [name].  SLNB x 12 lymph nodes all negative. Chemo and radiation completed. History of migraines.    Currently in Pain?  Yes    Pain Score  3    2.5-3/10   Pain Location  Scapula    Pain Orientation  Left    Pain Descriptors / Indicators  Aching;Throbbing    Pain Type  Chronic pain    Pain Onset  More than a month ago    Pain Frequency  Intermittent    Aggravating Factors   sitting in class over the weekend for long period of time    Pain Relieving Factors  Tylenol helped with recent flare up                       [facility]: [facility] Rhonda W Adult PT Treatment/Exercise - 04/24/19 0001      Shoulder Exercises: Supine   Horizontal ABduction  Strengthening;Both;10 reps;Theraband    Theraband Level (Shoulder Horizontal ABduction)  Level 2 (Red)    External Rotation  Strengthening;Both;10 reps;Theraband    Theraband Level (Shoulder External Rotation)  Level 2 (Red)    Flexion  Strengthening;Both;10  reps;Theraband   Narrow and Wide Grip 10 times each   Theraband Level (Shoulder Flexion)  Level 2 (Red)    Diagonals  Strengthening;Right;Left;10 reps;Theraband    Theraband Level (Shoulder Diagonals)  Level 2 (Red)    Diagonals Limitations  Pt returned all of therapist demo of ablove exercises      Manual Therapy   Soft tissue mobilization  with pt in Rt sidleying To Lt periscapular region, and posterior axilla/lattissimus area with extra pressure on tender trigger points at upper trap and along medial border of scapular    Scapular Mobilization  with pt in Rt sidelying into protraction with prolonged hold    Manual Lymphatic Drainage (MLD)  In Supine reviewing with pt throughout: Short neck, 5 diaphragmatic breaths, Lt inguinal and Rt axillary nodes, Lt axillo-inguinal and anterior  inter-axillary anastomosis, then Lt UE working from lateral upper arm to dorsum of hand proximally to distal then retracing all steps.     Passive ROM  In Supine to Lt shoulder into flexion and abduction to pts tolerance                  PT Long Term Goals - 04/20/19 1710      PT LONG TERM GOAL #1   Title  Pt will obtain appropriate compression for the left UE with instruction on use    Status  Partially Met      PT LONG TERM GOAL #2   Title  Pt will increase Lt shoulder horizontal abduction to at least 29 deg    Status  On-going      PT LONG TERM GOAL #3   Title  Pt will report only intermittent tightness in the left upper quadrant    Status  On-going      PT LONG TERM GOAL #4   Title  Pt will be ind with final HEP    Status  On-going            Plan - 04/24/19 1708    Clinical Impression Statement  Progressed HEP to include supine scapular series with red theraband which pt tolerated very well. Also instructed how to do these upright against wall for correct posture and with core engaged as pt reports could do these at her office. Then continued with manual lymph drainage reviewing with pt throughout. She is able to verbalize good understanding of sequence at this time. Some swelling noted at dorsal hand today so spent extra time here during MLD and instructed pt to keep an eye on this area because if swelling persists we'll need to look into getting her a hand piece to wear with her compression sleeve. Pt verbalized understanding.    Personal Factors and Comorbidities  Comorbidity 2    Comorbidities  node removal, radiation    Examination-Activity Limitations  Lift;Carry;Reach Overhead    Examination-Participation Restrictions  Cleaning;Community Activity;Yard Work    Merchant navy officer  Evolving/Moderate complexity    Rehab Potential  Excellent    PT Frequency  2x / week    PT Duration  6 weeks    PT Treatment/Interventions  ADLs/Self Care Home  Management;Manual techniques;Therapeutic exercise;Taping;Therapeutic activities;Patient/family education;Manual lymph drainage;Passive range of motion    PT Next Visit Plan  Review supine scapular series and pefrosm some manual scapular resistance to check activity, cont and assess tolerance of left Upper quadrant PROM and STM;  Teach strength ABC prior to discharge    PT Home Exercise Plan  Access Code: [facility]  Consulted and Agree with Plan of Care  Patient       Patient will benefit from skilled therapeutic intervention in order to improve the following deficits and impairments:  Decreased skin integrity, Decreased range of motion, Decreased scar mobility, Decreased activity tolerance, Decreased knowledge of use of DME, Decreased strength, Impaired UE functional use, Pain, Increased edema  Visit Diagnosis: Malignant neoplasm of upper-outer quadrant of left breast in female, estrogen receptor negative (HCC)  Abnormal posture  Stiffness of left shoulder, not elsewhere classified  Lymphedema  Acute pain of left shoulder     Problem List Patient Active Problem List   Diagnosis Date Noted  . S/P mastectomy, left 08/30/2018  . 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 Bronchiectasis ([place]) 07/21/2018  . Genetic testing 05/03/2018  . Port-A-Cath in place 04/07/2018  . Malignant neoplasm of upper-outer quadrant of left breast in female, estrogen receptor negative ([place]) 03/21/2018  . Migraine 11/21/2015  . Fibroids 08/22/2015    Otelia Limes, PTA 04/24/2019, 5:12 PM  [place], [place], [address] Phone: [phone]   Fax:  [phone]  Name: Corie Allis MRN: [id number] Date of Birth: [birth year]

## 2019-05-05 ENCOUNTER — Other Ambulatory Visit: Payer: Self-pay

## 2019-05-05 ENCOUNTER — Ambulatory Visit: Payer: BC Managed Care – PPO | Attending: Oncology

## 2019-05-05 DIAGNOSIS — Z171 Estrogen receptor negative status [ER-]: Secondary | ICD-10-CM | POA: Diagnosis present

## 2019-05-05 DIAGNOSIS — M25512 Pain in left shoulder: Secondary | ICD-10-CM | POA: Insufficient documentation

## 2019-05-05 DIAGNOSIS — I972 Postmastectomy lymphedema syndrome: Secondary | ICD-10-CM | POA: Insufficient documentation

## 2019-05-05 DIAGNOSIS — C50412 Malignant neoplasm of upper-outer quadrant of left female breast: Secondary | ICD-10-CM | POA: Diagnosis present

## 2019-05-05 DIAGNOSIS — R293 Abnormal posture: Secondary | ICD-10-CM | POA: Insufficient documentation

## 2019-05-05 DIAGNOSIS — M25612 Stiffness of left shoulder, not elsewhere classified: Secondary | ICD-10-CM | POA: Insufficient documentation

## 2019-05-05 DIAGNOSIS — I890 Lymphedema, not elsewhere classified: Secondary | ICD-10-CM | POA: Diagnosis present

## 2019-05-05 NOTE — Patient Instructions (Signed)
Access Code: [email or web address]  URL: [email or web address]  Date: 05/05/2019  Prepared by: Tomma Rakers   Exercises Seated Shoulder External Rotation with Dumbbells - 10 reps - 1 sets - cross the band over your knees have your elbows at 90 degrees with your thumbs pointing away from each otherand palms up toward the ceiling sit up tall keeping your shoulders down and back and squeeze your shoulder blades together thorughout the movement hold - 2x daily - 7x weekly Standing Shoulder Row with Anchored Resistance - 10 reps - 1 sets - keep shoulders down and back watch the L shoulder and the end range hold - 2x daily - 7x weekly Shoulder Extension with Resistance - 10 reps - 1 sets - keep shoulders down and back. Watch your L shoulder at the end range. hold - 2x daily - 7x weekly

## 2019-05-05 NOTE — Therapy (Signed)
[place], [place], [address] Phone: [phone]   Fax:  [phone]  Physical Therapy Treatment  Patient Details  Name: Paula Berry MRN: [id number] Date of Birth: 10-Jul-1970 Referring Provider (PT): Dr. Jana Hakim   Encounter Date: 05/05/2019  PT End of Session - 05/05/19 1058    Visit Number  7    Number of Visits  11    Date for PT Re-Evaluation  05/18/19    PT Start Time  1100    PT Stop Time  1200    PT Time Calculation (min)  60 min    Activity Tolerance  Patient tolerated treatment well    Behavior During Therapy  [facility] for tasks assessed/performed       Past Medical History:  Diagnosis Date  . Anemia    had iron infusion 08/12/15  . Asthma   . Breast cancer ([place]) 03/2018   Left Breast Cancer  . Cancer [facility])    Breast cancer   . Genital herpes   . GERD (gastroesophageal reflux disease)   . Headache    migraines 2-3 times monthly  . Pneumonia    hospitalizied for pneumonia as a child    Past Surgical History:  Procedure Laterality Date  . ABDOMINAL HYSTERECTOMY Bilateral 08/22/2015   Procedure: HYSTERECTOMY ABDOMINAL, BILATERAL SALPINGECTOMY;  Surgeon: Everlene Farrier, MD;  Location: [facility];  Service: Gynecology;  Laterality: Bilateral;  . MASTECTOMY Left 07/2018  . MASTECTOMY WITH AXILLARY LYMPH NODE DISSECTION Left 08/30/2018   Procedure: Left mastectomy with axillary lymph node dissection;  Surgeon: Rolm Bookbinder, MD;  Location: [place];  Service: General;  Laterality: Left;  . MYOMECTOMY ABDOMINAL APPROACH    . PORTACATH PLACEMENT N/A 03/30/2018   Procedure: INSERTION PORT-A-CATH WITH [place];  Surgeon: Rolm Bookbinder, MD;  Location: [place];  Service: General;  Laterality: N/A;    There were no vitals filed for this visit.  Subjective Assessment - 05/05/19 1058    Subjective  Pt states that she continues to have some tightness/stiffness in the  anterior L chest area where she had her surgery and radiation. She states that her L shoulder blade is feeling much better. She states that she has some edema in her L hand that she notices more in the morning. Throughout the day she will rub it lightly and it seems to go down.    Pertinent History  S/p Lt modified radical mastectomy on 08/30/18 with Dr. [name].  SLNB x 12 lymph nodes all negative. Chemo and radiation completed. History of migraines.    Patient Stated Goals  improve the left side    Currently in Pain?  Yes    Pain Score  1     Pain Location  Scapula    Pain Orientation  Left    Pain Descriptors / Indicators  Aching    Pain Type  Chronic pain    Pain Onset  More than a month ago    Pain Frequency  Intermittent                       [facility] Adult PT Treatment/Exercise - 05/05/19 0001      Exercises   Exercises  Shoulder      Shoulder Exercises: Seated   Other Seated Exercises  Seated external rotation with band crossed over knees with VC for scapular squeeze and maintaining shoulder depression throughout to address middle/lower traps 10x Red theraband.  Shoulder Exercises: Standing   Extension  Strengthening;Both;10 reps    Theraband Level (Shoulder Extension)  Level 2 (Red)    Extension Limitations  VC for shoulder depression on the R especially with end range pt tends to hike her L shoulder, VC and demonstration for tight transverse abdominus, standing tall and slow movement 10x red theraband.     Row  Strengthening;Both;10 reps    Theraband Level (Shoulder Row)  Level 2 (Red)    Row Limitations  VC for L shoulder depression she continues to hike with end range movement that improves with tacitle cueing and VC. Demonstration with VC for tight transverse abdominus bracing with one foot slightly in front of the other and shoulders down/back. 10x red theraband.       Manual Therapy   Manual Therapy  Soft tissue mobilization;Manual Lymphatic Drainage  (MLD);Myofascial release    Soft tissue mobilization  In sitting with pt leaning on chair, tightness tenderness noted in Bil thoracic paraspinals, periscapular musculature and upper trapezius: In supine: tenderness noted in the anteiror deltoid and anterior chest wall; decreased significantly following STM.     Myofascial Release  Myofascial release in Supine with pt LUE in abduction/ER over the anterior chest wall and into the medal brachium. 2 cords noted from the lateral incision on the L to the distal portion of the L axilla.     Manual Lymphatic Drainage (MLD)  In supine: 5 diaphramatic breathes, Bil axillary nodes, Bil inguinal nodes, establish anterior inter-axillary anastomosis, established L axillo-inguinal anastomosis, L brachium medial to latera, L lateral brachium, all surfaces of the L antebrachium, dorsum of L hand, reworked all surfaces.              PT Education - 05/05/19 1251    Education Details  Access Code: [email or web address], Pt will perform BIL scapular stablization exercises in order to decrease stress on the L scapular and the thoracic spine.    Person(s) Educated  Patient    Methods  Explanation;Demonstration;Tactile cues;Verbal cues;Handout    Comprehension  Verbalized understanding          PT Long Term Goals - 04/20/19 1710      PT LONG TERM GOAL #1   Title  Pt will obtain appropriate compression for the left UE with instruction on use    Status  Partially Met      PT LONG TERM GOAL #2   Title  Pt will increase Lt shoulder horizontal abduction to at least 29 deg    Status  On-going      PT LONG TERM GOAL #3   Title  Pt will report only intermittent tightness in the left upper quadrant    Status  On-going      PT LONG TERM GOAL #4   Title  Pt will be ind with final HEP    Status  On-going            Plan - 05/05/19 1057    Clinical Impression Statement  Pt presents today with significant tightness/tenderness in her thoracic spine including her  paraspinal muscluature and her L periscapular musculature in her middle/lower trap and inferior rhomboid as well as in her L anterior chest wall and anterior deltoid; decreased significantly following STM and myofascial release. Significant tightness was noted on the R thoracic paraspinals possibly putting stress on the L by pulling the vertebra toward the R either due to over compensation or imbalance in strength dynamics. BIl scapular/postural exercises were initiated this session in  order to decrease stress on the R side and improve muscle balance between R/L to decrease pain in the L scapular area. Pt reports signficant improvement in stiffness following manual work and Bil postural/scapular exercises. MLD was performed with pt in supine to the LUE following STM in order to decrease risk for fluid build up with large pocket of fluid noted in the L axilla that decreased significantly with MLD and light prolonged pressure in this area. Pt will possibly benefit from foam in this area. Pt will benefit from continued POC at this time.    Personal Factors and Comorbidities  Comorbidity 2    Comorbidities  node removal, radiation    Examination-Activity Limitations  Lift;Carry;Reach Overhead    Examination-Participation Restrictions  Cleaning;Community Activity;Yard Work    Environmental manager    PT Frequency  2x / week    PT Duration  6 weeks    PT Treatment/Interventions  ADLs/Self Care Home Management;Manual techniques;Therapeutic exercise;Taping;Therapeutic activities;Patient/family education;Manual lymph drainage;Passive range of motion    PT Next Visit Plan  Assess new HEP/STM and myofascial release, continue with BIl scapular/postural stabilization exercises, MLD to the LUE and possibly provide with foam for the L axilla    PT Home Exercise Plan  Access Code: [email or web address]    Consulted and Agree with Plan of Care  Patient       Patient will benefit from skilled therapeutic intervention in order  to improve the following deficits and impairments:  Decreased skin integrity, Decreased range of motion, Decreased scar mobility, Decreased activity tolerance, Decreased knowledge of use of DME, Decreased strength, Impaired UE functional use, Pain, Increased edema  Visit Diagnosis: Malignant neoplasm of upper-outer quadrant of left breast in female, estrogen receptor negative (HCC)  Abnormal posture  Stiffness of left shoulder, not elsewhere classified  Lymphedema  Acute pain of left shoulder     Problem List Patient Active Problem List   Diagnosis Date Noted  . S/P mastectomy, left 08/30/2018  . 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 Bronchiectasis ([place]) 07/21/2018  . Genetic testing 05/03/2018  . Port-A-Cath in place 04/07/2018  . Malignant neoplasm of upper-outer quadrant of left breast in female, estrogen receptor negative ([place]) 03/21/2018  . Migraine 11/21/2015  . Fibroids 08/22/2015    Ander Purpura, PT 05/05/2019, 12:58 PM  [place], [place], [address] Phone: [phone]   Fax:  [phone]  Name: Paula Berry MRN: [id number] Date of Birth: 31-Mar-1971

## 2019-05-08 ENCOUNTER — Encounter: Payer: Self-pay | Admitting: Rehabilitation

## 2019-05-08 ENCOUNTER — Other Ambulatory Visit: Payer: Self-pay

## 2019-05-08 ENCOUNTER — Ambulatory Visit: Payer: BC Managed Care – PPO | Admitting: Rehabilitation

## 2019-05-08 DIAGNOSIS — I890 Lymphedema, not elsewhere classified: Secondary | ICD-10-CM

## 2019-05-08 DIAGNOSIS — R293 Abnormal posture: Secondary | ICD-10-CM

## 2019-05-08 DIAGNOSIS — M25512 Pain in left shoulder: Secondary | ICD-10-CM

## 2019-05-08 DIAGNOSIS — C50412 Malignant neoplasm of upper-outer quadrant of left female breast: Secondary | ICD-10-CM | POA: Diagnosis not present

## 2019-05-08 DIAGNOSIS — M25612 Stiffness of left shoulder, not elsewhere classified: Secondary | ICD-10-CM

## 2019-05-08 NOTE — Therapy (Signed)
[place], [place], [address] Phone: [phone]   Fax:  [phone]  Physical Therapy Treatment  Patient Details  Name: Paula Berry MRN: [id number] Date of Birth: Nov 25, 1970 Referring Provider (PT): Dr. Jana Hakim   Encounter Date: 05/08/2019  PT End of Session - 05/08/19 1658    Visit Number  8    Number of Visits  11    Date for PT Re-Evaluation  05/18/19    PT Start Time  1604    PT Stop Time  1655    PT Time Calculation (min)  51 min    Activity Tolerance  Patient tolerated treatment well    Behavior During Therapy  [facility] for tasks assessed/performed       Past Medical History:  Diagnosis Date  . Anemia    had iron infusion 08/12/15  . Asthma   . Breast cancer ([place]) 03/2018   Left Breast Cancer  . Cancer [facility])    Breast cancer   . Genital herpes   . GERD (gastroesophageal reflux disease)   . Headache    migraines 2-3 times monthly  . Pneumonia    hospitalizied for pneumonia as a child    Past Surgical History:  Procedure Laterality Date  . ABDOMINAL HYSTERECTOMY Bilateral 08/22/2015   Procedure: HYSTERECTOMY ABDOMINAL, BILATERAL SALPINGECTOMY;  Surgeon: Everlene Farrier, MD;  Location: [facility];  Service: Gynecology;  Laterality: Bilateral;  . MASTECTOMY Left 07/2018  . MASTECTOMY WITH AXILLARY LYMPH NODE DISSECTION Left 08/30/2018   Procedure: Left mastectomy with axillary lymph node dissection;  Surgeon: Rolm Bookbinder, MD;  Location: [facility];  Service: General;  Laterality: Left;  . MYOMECTOMY ABDOMINAL APPROACH    . PORTACATH PLACEMENT N/A 03/30/2018   Procedure: INSERTION PORT-A-CATH WITH [place];  Surgeon: Rolm Bookbinder, MD;  Location: [facility];  Service: General;  Laterality: N/A;    There were no vitals filed for this visit.  Subjective Assessment - 05/08/19 1606    Subjective  I had a really good session last time.  I feel alot better.  I used a  weighted blanket and I think I threw me out of alightment.  It was good that we worked under the axilla and the other thing was just working on the whole back.    Pertinent History  S/p Lt modified radical mastectomy on 08/30/18 with Dr. [name].  SLNB x 12 lymph nodes all negative. Chemo and radiation completed. History of migraines.    Pain Score  1     Pain Location  Scapula    Pain Orientation  Left    Pain Descriptors / Indicators  Aching    Pain Type  Surgical pain;Chronic pain    Pain Onset  More than a month ago    Pain Frequency  Constant                       [facility] Adult PT Treatment/Exercise - 05/08/19 0001      Manual Therapy   Soft tissue mobilization  in seated: bil cervical and thoracic paraspinals, Rhomboids, UT with trigger point release as needed especially in the Lt UT and in supine to the left latissimus and pectoralis in stretched positions    Myofascial Release  D2 flexion position held with LTR to the Right 3x20"     Manual Lymphatic Drainage (MLD)  focus on Left axillary region towards inguinal nodes     Passive ROM  In  Supine to Lt shoulder into flexion and abduction to pts tolerance                  PT Long Term Goals - 04/20/19 1710      PT LONG TERM GOAL #1   Title  Pt will obtain appropriate compression for the left UE with instruction on use    Status  Partially Met      PT LONG TERM GOAL #2   Title  Pt will increase Lt shoulder horizontal abduction to at least 29 deg    Status  On-going      PT LONG TERM GOAL #3   Title  Pt will report only intermittent tightness in the left upper quadrant    Status  On-going      PT LONG TERM GOAL #4   Title  Pt will be ind with final HEP    Status  On-going            Plan - 05/08/19 1658    Clinical Impression Statement  continued with work seated into bilateral parapsinals, L UT and mid trap/rhomboids and into the Rt scapular muscles; AROM of the cspine demonstrated pull  into flexion with motion hinge around C7 and sidebend right in the Lt UT.  Thoracic motion demonstrating no sig pull and lumbar flexion showing possible rib hump on the Rt side.  Axilla region visibly softer and more free since this PT last saw her.  Discussed different bra or back to the tank as pt is still wearing a pretty binding bra.  Post treatment pt reports improvements but still had some tension in the anterior portion of the pectoralis which was stretched wiht a doorwway stretch .    PT Frequency  2x / week    PT Duration  6 weeks    PT Treatment/Interventions  ADLs/Self Care Home Management;Manual techniques;Therapeutic exercise;Taping;Therapeutic activities;Patient/family education;Manual lymph drainage;Passive range of motion    PT Next Visit Plan  cont seated bil paraspinal and scapular/cervical release as needed, pectoralis chest release, foam roller release/exercises? continue with BIl scapular/postural stabilization exercises, MLD to the LUE and possibly provide with foam for the L axilla    PT Home Exercise Plan  Access Code: [facility]    Recommended Other Services  pt now has sleeve and gauntlet and will get bras after the new year       Patient will benefit from skilled therapeutic intervention in order to improve the following deficits and impairments:     Visit Diagnosis: Malignant neoplasm of upper-outer quadrant of left breast in female, estrogen receptor negative (HCC)  Abnormal posture  Stiffness of left shoulder, not elsewhere classified  Lymphedema  Acute pain of left shoulder     Problem List Patient Active Problem List   Diagnosis Date Noted  . S/P mastectomy, left 08/30/2018  . 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 Bronchiectasis ([place]) 07/21/2018  . Genetic testing 05/03/2018  . Port-A-Cath in place 04/07/2018  . Malignant neoplasm of upper-outer quadrant of left breast in female, estrogen receptor negative ([place]) 03/21/2018  . Migraine 11/21/2015  . Fibroids  08/22/2015    Stark Bray 05/08/2019, 5:03 PM  [place], [place], [address] Phone: [phone]   Fax:  [phone]  Name: Paula Berry MRN: [id number] Date of Birth: [birth year]

## 2019-05-09 ENCOUNTER — Ambulatory Visit: Payer: BC Managed Care – PPO | Admitting: Physical Therapy

## 2019-05-11 ENCOUNTER — Ambulatory Visit: Payer: BC Managed Care – PPO | Admitting: Physical Therapy

## 2019-05-16 ENCOUNTER — Ambulatory Visit: Payer: BC Managed Care – PPO | Admitting: Physical Therapy

## 2019-05-16 ENCOUNTER — Encounter: Payer: Self-pay | Admitting: Physical Therapy

## 2019-05-16 ENCOUNTER — Other Ambulatory Visit: Payer: Self-pay

## 2019-05-16 DIAGNOSIS — M25612 Stiffness of left shoulder, not elsewhere classified: Secondary | ICD-10-CM

## 2019-05-16 DIAGNOSIS — C50412 Malignant neoplasm of upper-outer quadrant of left female breast: Secondary | ICD-10-CM | POA: Diagnosis not present

## 2019-05-16 DIAGNOSIS — M25512 Pain in left shoulder: Secondary | ICD-10-CM

## 2019-05-16 DIAGNOSIS — R293 Abnormal posture: Secondary | ICD-10-CM

## 2019-05-16 DIAGNOSIS — I890 Lymphedema, not elsewhere classified: Secondary | ICD-10-CM

## 2019-05-16 NOTE — Therapy (Signed)
[place], [place], [address] Phone: [phone]   Fax:  [phone]  Physical Therapy Treatment  Patient Details  Name: Paula Berry MRN: [id number] Date of Birth: 1971-03-10 Referring Provider (PT): Dr. Jana Hakim   Encounter Date: 05/16/2019  PT End of Session - 05/16/19 2022    Visit Number  9    Number of Visits  11    Date for PT Re-Evaluation  05/18/19    PT Start Time  1600    PT Stop Time  1645    PT Time Calculation (min)  45 min    Activity Tolerance  Patient tolerated treatment well    Behavior During Therapy  [facility] for tasks assessed/performed       Past Medical History:  Diagnosis Date  . Anemia    had iron infusion 08/12/15  . Asthma   . Breast cancer ([place]) 03/2018   Left Breast Cancer  . Cancer [facility])    Breast cancer   . Genital herpes   . GERD (gastroesophageal reflux disease)   . Headache    migraines 2-3 times monthly  . Pneumonia    hospitalizied for pneumonia as a child    Past Surgical History:  Procedure Laterality Date  . ABDOMINAL HYSTERECTOMY Bilateral 08/22/2015   Procedure: HYSTERECTOMY ABDOMINAL, BILATERAL SALPINGECTOMY;  Surgeon: Everlene Farrier, MD;  Location: [facility];  Service: Gynecology;  Laterality: Bilateral;  . MASTECTOMY Left 07/2018  . MASTECTOMY WITH AXILLARY LYMPH NODE DISSECTION Left 08/30/2018   Procedure: Left mastectomy with axillary lymph node dissection;  Surgeon: Rolm Bookbinder, MD;  Location: [facility];  Service: General;  Laterality: Left;  . MYOMECTOMY ABDOMINAL APPROACH    . PORTACATH PLACEMENT N/A 03/30/2018   Procedure: INSERTION PORT-A-CATH WITH [place];  Surgeon: Rolm Bookbinder, MD;  Location: [facility];  Service: General;  Laterality: N/A;    There were no vitals filed for this visit.  Subjective Assessment - 05/16/19 1603    Subjective  "I'm feeling about the same". Pt still has congestion in left axilla  and proximal lateral chest and with tightness at incision.    Pertinent History  S/p Lt modified radical mastectomy on 08/30/18 with Dr. [name].  SLNB x 12 lymph nodes all negative. Chemo and radiation completed. History of migraines.    Patient Stated Goals  improve the left side    Currently in Pain?  Yes    Pain Score  2     Pain Location  --   left upper quadrant   Pain Orientation  Left    Pain Descriptors / Indicators  Aching    Pain Type  Chronic pain    Pain Onset  More than a month ago    Pain Frequency  Constant                       [facility] Adult PT Treatment/Exercise - 05/16/19 0001      Manual Therapy   Manual Therapy  Edema management;Myofascial release;Manual Lymphatic Drainage (MLD)    Edema Management  added large dotted foam on white foam built up to fill up space discuessed with pt a Tribute Night vest with sleeve to try to get compression to area since compression bras do not work due to the band . Email sent to [facility] to see if there would be coverage for this.     Myofascial Release  to anterior and lateral chest around scars with  extra work on lifting skin around scar.     Manual Lymphatic Drainage (MLD)  diaphragmatic breath, left shoulder circle , extra time spent on left anterior chest above incision and down lateral chest, then to right sidleying for posterior interaxaillary anastamosis and lateral chest, then back to supine for more work on axilla with arm overhead                   PT Long Term Goals - 04/20/19 1710      PT LONG TERM GOAL #1   Title  Pt will obtain appropriate compression for the left UE with instruction on use    Status  Partially Met      PT LONG TERM GOAL #2   Title  Pt will increase Lt shoulder horizontal abduction to at least 29 deg    Status  On-going      PT LONG TERM GOAL #3   Title  Pt will report only intermittent tightness in the left upper quadrant    Status  On-going      PT LONG TERM GOAL #4    Title  Pt will be ind with final HEP    Status  On-going            Plan - 05/16/19 2023    Clinical Impression Statement  Pt is improving in pain and is wearing her sleeve along with elevation and exercise to help with control of lymphedema in her arm She continues to have congestion in her axilla and upper chest above her incision that is persistent depsite compression.  Anticipate she may continue to struggle with lymphedema due to 12 nodes removed and 36 sessions of radiation with persistent skin changes.  Recommend nighttime compression for trunk, arm and hand.  She will likely benefit from an advanced compression pump as well to manage her trunk and arm lymphedema long term on her own.    Personal Factors and Comorbidities  Comorbidity 2    Comorbidities  node removal, radiation    Examination-Participation Restrictions  Cleaning;Community Activity;Yard Work    [employer] officer  Evolving/Moderate complexity    Rehab Potential  Excellent    PT Frequency  2x / week    PT Duration  6 weeks    PT Treatment/Interventions  ADLs/Self Care Home Management;Manual techniques;Therapeutic exercise;Taping;Therapeutic activities;Patient/family education;Manual lymph drainage;Passive range of motion    PT Next Visit Plan  Renew. assess effect of foam. Cont myofascial release, scar mobilizaion and  MLD to the trunk and LUE check on night garments and pump as needed.    Consulted and Agree with Plan of Care  Patient       Patient will benefit from skilled therapeutic intervention in order to improve the following deficits and impairments:  Decreased skin integrity, Decreased range of motion, Decreased scar mobility, Decreased activity tolerance, Decreased knowledge of use of DME, Decreased strength, Impaired UE functional use, Pain, Increased edema  Visit Diagnosis: Abnormal posture  Stiffness of left shoulder, not elsewhere classified  Lymphedema  Acute pain of left  shoulder     Problem List Patient Active Problem List   Diagnosis Date Noted  . S/P mastectomy, left 08/30/2018  . 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 Bronchiectasis ([place]) 07/21/2018  . Genetic testing 05/03/2018  . Port-A-Cath in place 04/07/2018  . Malignant neoplasm of upper-outer quadrant of left breast in female, estrogen receptor negative ([place]) 03/21/2018  . Migraine 11/21/2015  . Fibroids 08/22/2015   Donato Heinz. Owens Shark, PT  Norwood Levo 05/16/2019, 8:30 PM  [place], [place], [address] Phone: [phone]   Fax:  [phone]  Name: Paula Berry MRN: [id number] Date of Birth: 04-Feb-1971

## 2019-05-18 ENCOUNTER — Other Ambulatory Visit: Payer: Self-pay

## 2019-05-18 ENCOUNTER — Ambulatory Visit: Payer: BC Managed Care – PPO | Admitting: Physical Therapy

## 2019-05-18 DIAGNOSIS — R293 Abnormal posture: Secondary | ICD-10-CM

## 2019-05-18 DIAGNOSIS — C50412 Malignant neoplasm of upper-outer quadrant of left female breast: Secondary | ICD-10-CM | POA: Diagnosis not present

## 2019-05-18 DIAGNOSIS — M25512 Pain in left shoulder: Secondary | ICD-10-CM

## 2019-05-18 DIAGNOSIS — I972 Postmastectomy lymphedema syndrome: Secondary | ICD-10-CM

## 2019-05-18 DIAGNOSIS — M25612 Stiffness of left shoulder, not elsewhere classified: Secondary | ICD-10-CM

## 2019-05-18 NOTE — Therapy (Signed)
[place], [place], [address] Phone: [phone]   Fax:  [phone]  Physical Therapy Treatment  Patient Details  Name: Paula Berry MRN: [id number] Date of Birth: February 01, 1971 Referring Provider (PT): Dr. Jana Hakim  Progress Note Reporting Period 04/06/2019  to 05/18/2019  See note below for Objective Data and Assessment of Progress/Goals.      Encounter Date: 05/18/2019  PT End of Session - 05/18/19 1714    Visit Number  10    Number of Visits  19    Date for PT Re-Evaluation  07/19/19    PT Start Time  1600    PT Stop Time  1655    PT Time Calculation (min)  55 min    Activity Tolerance  Patient tolerated treatment well    Behavior During Therapy  [facility] for tasks assessed/performed       Past Medical History:  Diagnosis Date  . Anemia    had iron infusion 08/12/15  . Asthma   . Breast cancer ([place]) 03/2018   Left Breast Cancer  . Cancer [facility])    Breast cancer   . Genital herpes   . GERD (gastroesophageal reflux disease)   . Headache    migraines 2-3 times monthly  . Pneumonia    hospitalizied for pneumonia as a child    Past Surgical History:  Procedure Laterality Date  . ABDOMINAL HYSTERECTOMY Bilateral 08/22/2015   Procedure: HYSTERECTOMY ABDOMINAL, BILATERAL SALPINGECTOMY;  Surgeon: Everlene Farrier, MD;  Location: [facility];  Service: Gynecology;  Laterality: Bilateral;  . MASTECTOMY Left 07/2018  . MASTECTOMY WITH AXILLARY LYMPH NODE DISSECTION Left 08/30/2018   Procedure: Left mastectomy with axillary lymph node dissection;  Surgeon: Rolm Bookbinder, MD;  Location: [facility];  Service: General;  Laterality: Left;  . MYOMECTOMY ABDOMINAL APPROACH    . PORTACATH PLACEMENT N/A 03/30/2018   Procedure: INSERTION PORT-A-CATH WITH [place];  Surgeon: Rolm Bookbinder, MD;  Location: [facility];  Service: General;  Laterality: N/A;    There were no vitals filed for  this visit.  Subjective Assessment - 05/18/19 1710    Subjective  Pt has been wearing her large dotted foam in her sports bra . She wants to move forward with the compression vest and pump to do everything she can to not get lymphedema.  She felt that her hand was puffy and so she work her sleeve and gauntlet today. but she comes in with just the sleeve on.  Pt educated that just wearing the sleeve and no gauntlet may contrubute to hand swelling.    Pertinent History  S/p Lt modified radical mastectomy on 08/30/18 with Dr. [name].  SLNB x 12 lymph nodes all negative. Chemo and radiation completed. History of migraines.    Patient Stated Goals  improve the left side    Currently in Pain?  Yes    Pain Score  2    usual state        [facility] PT Assessment - 05/18/19 0001      Assessment   Medical Diagnosis  lt mastectomy     Referring Provider (PT)  Dr. Jana Hakim    Onset Date/Surgical Date  08/30/18      Prior Function   Level of Independence  Independent        LYMPHEDEMA/ONCOLOGY QUESTIONNAIRE - 05/18/19 1623      Right Upper Extremity Lymphedema   At Axilla   40 cm    15 cm Proximal  to Olecranon Process  36.5 cm    10 cm Proximal to Olecranon Process  34.8 cm    Olecranon Process  27.5 cm    15 cm Proximal to Ulnar Styloid Process  26 cm    10 cm Proximal to Ulnar Styloid Process  21.5 cm    Just Proximal to Ulnar Styloid Process  16 cm    Across Hand at [employer]  18.5 cm    At [place] of 2nd Digit  5.9 cm      Left Upper Extremity Lymphedema   At Axilla   38.5 cm    15 cm Proximal to Olecranon Process  36.5 cm    10 cm Proximal to Olecranon Process  35 cm    Olecranon Process  26.5 cm    15 cm Proximal to Ulnar Styloid Process  25.5 cm    10 cm Proximal to Ulnar Styloid Process  21.6 cm    Just Proximal to Ulnar Styloid Process  16.2 cm    Across Hand at [employer]  18.7 cm    At [place] of 2nd Digit  6 cm                [facility] Adult PT  Treatment/Exercise - 05/18/19 0001      Manual Therapy   Manual Therapy  Edema management;Myofascial release;Manual Lymphatic Drainage (MLD)    Manual therapy comments  goals reassessed and treatment plan for scar mobilizaion and lymphedema reduction discussed     Myofascial Release  to anterior and lateral chest around scars with extra work on lifting skin around scar.     Manual Lymphatic Drainage (MLD)  In supine: 5 diaphramatic breathes, Bil axillary nodes, Bil inguinal nodes, establish anterior inter-axillary anastomosis, established L axillo-inguinal anastomosis, L brachium medial to latera, L lateral brachium, all surfaces of the L antebrachium, dorsum of L hand, reworked all surfaces.  then to sidelying for posterior interaxillay anastamosis                   PT Long Term Goals - 05/18/19 1619      PT LONG TERM GOAL #1   Title  Pt will obtain appropriate compression for the left UE with instruction on use    Baseline  Pt has daytime sleeve and gauntlet but is waiting on nightime arm and glove and possible vest and compression pump    Status  On-going      PT LONG TERM GOAL #2   Title  Pt will increase Lt shoulder horizontal abduction to at least 29 deg    Baseline  16 on 04/06/19.90degrees on 05/18/2019    Status  Achieved      PT LONG TERM GOAL #3   Title  Pt will report only intermittent tightness in the left upper quadrant    Baseline  Pt is still having tightness but it is not as intense    Time  8    Period  Weeks    Status  On-going      PT LONG TERM GOAL #4   Title  Pt will be ind with final HEP    Status  Achieved      PT LONG TERM GOAL #5   Title  Pt will report she is confident in knowing how to manage her lymphedema    Time  8    Period  Weeks    Status  New      Additional Long Term  Goals   Additional Long Term Goals  Yes      PT LONG TERM GOAL #6   Title  Pt will report that she has 50% more movement at left chest scar    Time  8    Period   Weeks    Status  New            Plan - 05/18/19 1720    Clinical Impression Statement  Congested tissue at left axilla feels softer after wearing compression foam and compression bra.  She has some increased fullness in left forearm today and is worried about develping lymphedema. She has daytime compression sleeves and nighttime garments are ordered.  She would benefit from a compression vest to help with persistent fullness at left axilla and also compression pump as she may need to continue to manage her symptoms. Renewal sent for  1 time a week for 8 weeks    Comorbidities  node removal, radiation    Examination-Activity Limitations  Lift;Carry;Reach Overhead    Examination-Participation Restrictions  Cleaning;Community Activity;Yard Work    [employer]chant [employer] officer  Evolving/Moderate complexity    Rehab Potential  Excellent    PT Frequency  1x / week    PT Duration  8 weeks    PT Treatment/Interventions  ADLs/Self Care Home Management;Manual techniques;Therapeutic exercise;Taping;Therapeutic activities;Patient/family education;Manual lymph drainage;Passive range of motion    PT Next Visit Plan  focus treatment on mobilizaion of scar and reduction of fullness at axilla, preventing lymhedema in her arm. continue  assess effect of foam. Cont myofascial release, scar mobilizaion and  MLD to the trunk and LUE  continue to help with garments and pump as needed    Recommended Other Services  demographics sent to [employer] for pump , talked with Melissa about getting Tribute wraps for nght and a compression vest(?)       Patient will benefit from skilled therapeutic intervention in order to improve the following deficits and impairments:  Decreased skin integrity, Decreased range of motion, Decreased scar mobility, Decreased activity tolerance, Decreased knowledge of use of DME, Decreased strength, Impaired UE functional use, Pain, Increased edema  Visit Diagnosis: Abnormal posture -  Plan: PT plan of care cert/re-cert  Stiffness of left shoulder, not elsewhere classified - Plan: PT plan of care cert/re-cert  Postmastectomy lymphedema - Plan: PT plan of care cert/re-cert  Acute pain of left shoulder - Plan: PT plan of care cert/re-cert     Problem List Patient Active Problem List   Diagnosis Date Noted  . S/P mastectomy, left 08/30/2018  . 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 Bronchiectasis ([place]) 07/21/2018  . Genetic testing 05/03/2018  . Port-A-Cath in place 04/07/2018  . Malignant neoplasm of upper-outer quadrant of left breast in female, estrogen receptor negative ([place]) 03/21/2018  . Migraine 11/21/2015  . Fibroids 08/22/2015   Donato Heinz. Owens Shark PT  Nor[place] Levo 05/18/2019, 5:41 PM  [place], [place], [address] Phone: [phone]   Fax:  [phone]  Name: Paula Berry MRN: [id number] Date of Birth: 05/25/1971

## 2019-05-22 ENCOUNTER — Encounter: Payer: BC Managed Care – PPO | Admitting: Physical Therapy

## 2019-06-06 ENCOUNTER — Ambulatory Visit: Payer: BC Managed Care – PPO | Admitting: Physical Therapy

## 2019-06-07 ENCOUNTER — Encounter: Payer: Self-pay | Admitting: Rehabilitation

## 2019-06-07 ENCOUNTER — Ambulatory Visit: Payer: BC Managed Care – PPO | Attending: Oncology | Admitting: Rehabilitation

## 2019-06-07 ENCOUNTER — Other Ambulatory Visit: Payer: Self-pay

## 2019-06-07 DIAGNOSIS — Z171 Estrogen receptor negative status [ER-]: Secondary | ICD-10-CM | POA: Diagnosis present

## 2019-06-07 DIAGNOSIS — M25612 Stiffness of left shoulder, not elsewhere classified: Secondary | ICD-10-CM | POA: Diagnosis present

## 2019-06-07 DIAGNOSIS — C50412 Malignant neoplasm of upper-outer quadrant of left female breast: Secondary | ICD-10-CM | POA: Diagnosis present

## 2019-06-07 DIAGNOSIS — I890 Lymphedema, not elsewhere classified: Secondary | ICD-10-CM | POA: Diagnosis present

## 2019-06-07 DIAGNOSIS — R293 Abnormal posture: Secondary | ICD-10-CM

## 2019-06-07 DIAGNOSIS — M25512 Pain in left shoulder: Secondary | ICD-10-CM

## 2019-06-07 DIAGNOSIS — I972 Postmastectomy lymphedema syndrome: Secondary | ICD-10-CM | POA: Diagnosis present

## 2019-06-07 NOTE — Therapy (Signed)
[place], [place], [address] Phone: [phone]   Fax:  [phone]  Physical Therapy Treatment  Patient Details  Name: Paula Berry MRN: [id number] Date of Birth: 06/24/1970 Referring Provider (PT): Dr. Jana Hakim   Encounter Date: 06/07/2019  PT End of Session - 06/07/19 1706    Visit Number  11    Number of Visits  19    Date for PT Re-Evaluation  07/19/19    PT Start Time  1600    PT Stop Time  1654    PT Time Calculation (min)  54 min    Activity Tolerance  Patient tolerated treatment well    Behavior During Therapy  [facility] for tasks assessed/performed       Past Medical History:  Diagnosis Date  . Anemia    had iron infusion 08/12/15  . Asthma   . Breast cancer ([place]) 03/2018   Left Breast Cancer  . Cancer [facility])    Breast cancer   . Genital herpes   . GERD (gastroesophageal reflux disease)   . Headache    migraines 2-3 times monthly  . Pneumonia    hospitalizied for pneumonia as a child    Past Surgical History:  Procedure Laterality Date  . ABDOMINAL HYSTERECTOMY Bilateral 08/22/2015   Procedure: HYSTERECTOMY ABDOMINAL, BILATERAL SALPINGECTOMY;  Surgeon: Everlene Farrier, MD;  Location: [facility];  Service: Gynecology;  Laterality: Bilateral;  . MASTECTOMY Left 07/2018  . MASTECTOMY WITH AXILLARY LYMPH NODE DISSECTION Left 08/30/2018   Procedure: Left mastectomy with axillary lymph node dissection;  Surgeon: Rolm Bookbinder, MD;  Location: [place];  Service: General;  Laterality: Left;  . MYOMECTOMY ABDOMINAL APPROACH    . PORTACATH PLACEMENT N/A 03/30/2018   Procedure: INSERTION PORT-A-CATH WITH [place];  Surgeon: Rolm Bookbinder, MD;  Location: [place];  Service: General;  Laterality: N/A;    There were no vitals filed for this visit.  Subjective Assessment - 06/07/19 1555    Subjective  I was feeling fine until the few days after new year and then I was  very achy and all of the discomfort returned.    Pertinent History  S/p Lt modified radical mastectomy on 08/30/18 with Dr. [name].  SLNB x 12 lymph nodes all negative. Chemo and radiation completed. History of migraines.    Currently in Pain?  Yes    Pain Score  5     Pain Location  Axilla    Pain Orientation  Left    Pain Descriptors / Indicators  Aching    Pain Type  Chronic pain    Pain Onset  1 to 4 weeks ago    Pain Frequency  Constant                       [facility] Adult PT Treatment/Exercise - 06/07/19 0001      Manual Therapy   Soft tissue mobilization  in seated: bil cervical and thoracic paraspinals, Rhomboids, UT with trigger point release as needed and in supine to the biceps proximal belly and insertion    Myofascial Release  to anterior and lateral chest around scars with extra work on lifting skin around scar.     Manual Lymphatic Drainage (MLD)  In supine: Bil axillary nodes, Lt inguinal nodes, establish anterior inter-axillary anastomosis, established L axillo-inguinal anastomosis, Then focus on the left anterior and lateral chest and then performing all steps on the left arm.  Then reversing  all steps.  Also in right sidelying posterior interaxillary work.                   PT Long Term Goals - 05/18/19 1619      PT LONG TERM GOAL #1   Title  Pt will obtain appropriate compression for the left UE with instruction on use    Baseline  Pt has daytime sleeve and gauntlet but is waiting on nightime arm and glove and possible vest and compression pump    Status  On-going      PT LONG TERM GOAL #2   Title  Pt will increase Lt shoulder horizontal abduction to at least 29 deg    Baseline  16 on 04/06/19.90degrees on 05/18/2019    Status  Achieved      PT LONG TERM GOAL #3   Title  Pt will report only intermittent tightness in the left upper quadrant    Baseline  Pt is still having tightness but it is not as intense    Time  8    Period  Weeks     Status  On-going      PT LONG TERM GOAL #4   Title  Pt will be ind with final HEP    Status  Achieved      PT LONG TERM GOAL #5   Title  Pt will report she is confident in knowing how to manage her lymphedema    Time  8    Period  Weeks    Status  New      Additional Long Term Goals   Additional Long Term Goals  Yes      PT LONG TERM GOAL #6   Title  Pt will report that she has 50% more movement at left chest scar    Time  8    Period  Weeks    Status  New            Plan - 06/07/19 1706    Clinical Impression Statement  Pt 2-3 weeks out from last session with return of the left quadrant achiness and pain around 5/10 and needing Tylenol again.  Also reporting more fullness.  This is relieved after STM and MLD work today demonstrating how pt would benefit from a compression pump to continue improvements at home.  chest tightness has seemed to improve since this PT last saw pt.    PT Frequency  1x / week    PT Duration  8 weeks    PT Treatment/Interventions  ADLs/Self Care Home Management;Manual techniques;Therapeutic exercise;Taping;Therapeutic activities;Patient/family education;Manual lymph drainage;Passive range of motion    PT Next Visit Plan  focus treatment on mobilizaion of scar and reduction of fullness at axilla, preventing lymhedema in her arm. continue  assess effect of foam. Cont myofascial release, scar mobilizaion and  MLD to the trunk and LUE  continue to help with garments and pump as needed    Consulted and Agree with Plan of Care  Patient       Patient will benefit from skilled therapeutic intervention in order to improve the following deficits and impairments:     Visit Diagnosis: Abnormal posture  Stiffness of left shoulder, not elsewhere classified  Postmastectomy lymphedema  Acute pain of left shoulder  Lymphedema     Problem List Patient Active Problem List   Diagnosis Date Noted  . S/P mastectomy, left 08/30/2018  . 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 Bronchiectasis ([place]) 07/21/2018  .  Genetic testing 05/03/2018  . Port-A-Cath in place 04/07/2018  . Malignant neoplasm of upper-outer quadrant of left breast in female, estrogen receptor negative ([place]) 03/21/2018  . Migraine 11/21/2015  . Fibroids 08/22/2015    Stark Bray 06/07/2019, 5:08 PM  [place], [place], [address] Phone: [phone]   Fax:  [phone]  Name: Paula Berry MRN: [id number] Date of Birth: 12/24/1970

## 2019-06-14 ENCOUNTER — Encounter: Payer: Self-pay | Admitting: Rehabilitation

## 2019-06-14 ENCOUNTER — Other Ambulatory Visit: Payer: Self-pay

## 2019-06-14 ENCOUNTER — Ambulatory Visit: Payer: BC Managed Care – PPO | Admitting: Rehabilitation

## 2019-06-14 DIAGNOSIS — R293 Abnormal posture: Secondary | ICD-10-CM

## 2019-06-14 DIAGNOSIS — M25612 Stiffness of left shoulder, not elsewhere classified: Secondary | ICD-10-CM

## 2019-06-14 DIAGNOSIS — I972 Postmastectomy lymphedema syndrome: Secondary | ICD-10-CM

## 2019-06-14 DIAGNOSIS — M25512 Pain in left shoulder: Secondary | ICD-10-CM

## 2019-06-14 DIAGNOSIS — Z171 Estrogen receptor negative status [ER-]: Secondary | ICD-10-CM

## 2019-06-14 DIAGNOSIS — I890 Lymphedema, not elsewhere classified: Secondary | ICD-10-CM

## 2019-06-14 DIAGNOSIS — C50412 Malignant neoplasm of upper-outer quadrant of left female breast: Secondary | ICD-10-CM

## 2019-06-14 NOTE — Therapy (Signed)
[place], [place], [address] Phone: [phone]   Fax:  [phone]  Physical Therapy Treatment  Patient Details  Name: Paula Berry MRN: [id number] Date of Birth: [birth year] Referring Provider (PT): Dr. Jana Hakim   Encounter Date: 06/14/2019  PT End of Session - 06/14/19 1709    Visit Number  12    Number of Visits  19    Date for PT Re-Evaluation  07/19/19    PT Start Time  1602    PT Stop Time  1655    PT Time Calculation (min)  53 min    Activity Tolerance  Patient tolerated treatment well    Behavior During Therapy  [facility] for tasks assessed/performed       Past Medical History:  Diagnosis Date  . Anemia    had iron infusion 08/12/15  . Asthma   . Breast cancer ([place]) 03/2018   Left Breast Cancer  . Cancer [facility])    Breast cancer   . Genital herpes   . GERD (gastroesophageal reflux disease)   . Headache    migraines 2-3 times monthly  . Pneumonia    hospitalizied for pneumonia as a child    Past Surgical History:  Procedure Laterality Date  . ABDOMINAL HYSTERECTOMY Bilateral 08/22/2015   Procedure: HYSTERECTOMY ABDOMINAL, BILATERAL SALPINGECTOMY;  Surgeon: Everlene Farrier, MD;  Location: [facility];  Service: Gynecology;  Laterality: Bilateral;  . MASTECTOMY Left 07/2018  . MASTECTOMY WITH AXILLARY LYMPH NODE DISSECTION Left 08/30/2018   Procedure: Left mastectomy with axillary lymph node dissection;  Surgeon: Rolm Bookbinder, MD;  Location: [place];  Service: General;  Laterality: Left;  . MYOMECTOMY ABDOMINAL APPROACH    . PORTACATH PLACEMENT N/A 03/30/2018   Procedure: INSERTION PORT-A-CATH WITH [place];  Surgeon: Rolm Bookbinder, MD;  Location: [place];  Service: General;  Laterality: N/A;    There were no vitals filed for this visit.  Subjective Assessment - 06/14/19 1603    Subjective  The flexitouch was shipped today.  My discomfort in in the Left  armpit and around to the back.  I have not used Tylenol in the past few days.    Pertinent History  S/p Lt modified radical mastectomy on 08/30/18 with Dr. [name].  SLNB x 12 lymph nodes all negative. Chemo and radiation completed. History of migraines.    Currently in Pain?  No/denies    Pain Score  3     Pain Location  Axilla    Pain Orientation  Left    Pain Descriptors / Indicators  Aching    Pain Type  Chronic pain    Pain Onset  1 to 4 weeks ago    Pain Frequency  Constant                       [facility] Adult PT Treatment/Exercise - 06/14/19 0001      Manual Therapy   Soft tissue mobilization  in seated: bil cervical and thoracic paraspinals, Rhomboids, UT with trigger point release as needed and in supine to the biceps proximal belly and insertion    Myofascial Release  to anterior and lateral chest around scars with extra work on lifting skin around scar.     Manual Lymphatic Drainage (MLD)  In supine: Bil axillary nodes, Lt inguinal nodes, establish anterior inter-axillary anastomosis, established L axillo-inguinal anastomosis, Then focus on the left anterior and lateral chest and then performing all steps on  the left arm.  Then reversing all steps.  Also in right sidelying posterior interaxillary work.     Passive ROM  In Supine to Lt shoulder into flexion and abduction to pts tolerance                  PT Long Term Goals - 05/18/19 1619      PT LONG TERM GOAL #1   Title  Pt will obtain appropriate compression for the left UE with instruction on use    Baseline  Pt has daytime sleeve and gauntlet but is waiting on nightime arm and glove and possible vest and compression pump    Status  On-going      PT LONG TERM GOAL #2   Title  Pt will increase Lt shoulder horizontal abduction to at least 29 deg    Baseline  16 on 04/06/19.90degrees on 05/18/2019    Status  Achieved      PT LONG TERM GOAL #3   Title  Pt will report only intermittent tightness in  the left upper quadrant    Baseline  Pt is still having tightness but it is not as intense    Time  8    Period  Weeks    Status  On-going      PT LONG TERM GOAL #4   Title  Pt will be ind with final HEP    Status  Achieved      PT LONG TERM GOAL #5   Title  Pt will report she is confident in knowing how to manage her lymphedema    Time  8    Period  Weeks    Status  New      Additional Long Term Goals   Additional Long Term Goals  Yes      PT LONG TERM GOAL #6   Title  Pt will report that she has 50% more movement at left chest scar    Time  8    Period  Weeks    Status  New            Plan - 06/14/19 1709    Clinical Impression Statement  Pt with less tightness and pain since previous session and has not had to take Tylenol but was starting to have increased pain again today.  Overall pt is softneing in the left axilla and chest but still struggles with chronic UE and axillary congestion.  Her pump shipped out today and she will be getting her garments soon.  Pt continues to feel less congested and less pain after all therapy sesssions.    PT Frequency  1x / week    PT Duration  8 weeks    PT Treatment/Interventions  ADLs/Self Care Home Management;Manual techniques;Therapeutic exercise;Taping;Therapeutic activities;Patient/family education;Manual lymph drainage;Passive range of motion    PT Next Visit Plan  focus treatment on mobilizaion of scar and reduction of fullness at axilla, preventing lymhedema in her arm. continue  assess effect of foam. Cont myofascial release, scar mobilizaion and  MLD to the trunk and LUE  continue to help with garments and pump as needed    Consulted and Agree with Plan of Care  Patient       Patient will benefit from skilled therapeutic intervention in order to improve the following deficits and impairments:     Visit Diagnosis: Abnormal posture  Stiffness of left shoulder, not elsewhere classified  Postmastectomy lymphedema  Acute  pain of left shoulder  Lymphedema  Malignant neoplasm of upper-outer quadrant of left breast in female, estrogen receptor negative [facility])     Problem List Patient Active Problem List   Diagnosis Date Noted  . S/P mastectomy, left 08/30/2018  . 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 Bronchiectasis ([place]) 07/21/2018  . Genetic testing 05/03/2018  . Port-A-Cath in place 04/07/2018  . Malignant neoplasm of upper-outer quadrant of left breast in female, estrogen receptor negative ([place]) 03/21/2018  . Migraine 11/21/2015  . Fibroids 08/22/2015    Stark Bray 06/14/2019, 5:12 PM  [place], [place], [address] Phone: [phone]   Fax:  [phone]  Name: Paula Berry MRN: [id number] Date of Birth: 10/03/1970

## 2019-06-20 ENCOUNTER — Encounter: Payer: BC Managed Care – PPO | Admitting: Physical Therapy

## 2019-06-21 NOTE — Telephone Encounter (Signed)
No entry 

## 2019-06-22 ENCOUNTER — Ambulatory Visit: Payer: BC Managed Care – PPO | Admitting: Physical Therapy

## 2019-06-22 ENCOUNTER — Other Ambulatory Visit: Payer: Self-pay

## 2019-06-22 ENCOUNTER — Encounter: Payer: Self-pay | Admitting: Physical Therapy

## 2019-06-22 DIAGNOSIS — M25612 Stiffness of left shoulder, not elsewhere classified: Secondary | ICD-10-CM

## 2019-06-22 DIAGNOSIS — M25512 Pain in left shoulder: Secondary | ICD-10-CM

## 2019-06-22 DIAGNOSIS — I972 Postmastectomy lymphedema syndrome: Secondary | ICD-10-CM

## 2019-06-22 DIAGNOSIS — R293 Abnormal posture: Secondary | ICD-10-CM | POA: Diagnosis not present

## 2019-06-22 NOTE — Therapy (Signed)
[place], [place], [address] Phone: [phone]   Fax:  [phone]  Physical Therapy Treatment  Patient Details  Name: Paula Berry MRN: [id number] Date of Birth: August 03, 1970 Referring Provider (PT): Dr. Jana Hakim   Encounter Date: 06/22/2019  PT End of Session - 06/22/19 1650    Visit Number  13    Number of Visits  19    Date for PT Re-Evaluation  07/19/19    PT Start Time  1600    PT Stop Time  1645    PT Time Calculation (min)  45 min    Activity Tolerance  Patient tolerated treatment well    Behavior During Therapy  [facility] for tasks assessed/performed       Past Medical History:  Diagnosis Date  . Anemia    had iron infusion 08/12/15  . Asthma   . Breast cancer ([place]) 03/2018   Left Breast Cancer  . Cancer [facility])    Breast cancer   . Genital herpes   . GERD (gastroesophageal reflux disease)   . Headache    migraines 2-3 times monthly  . Pneumonia    hospitalizied for pneumonia as a child    Past Surgical History:  Procedure Laterality Date  . ABDOMINAL HYSTERECTOMY Bilateral 08/22/2015   Procedure: HYSTERECTOMY ABDOMINAL, BILATERAL SALPINGECTOMY;  Surgeon: Everlene Farrier, MD;  Location: [facility];  Service: Gynecology;  Laterality: Bilateral;  . MASTECTOMY Left 07/2018  . MASTECTOMY WITH AXILLARY LYMPH NODE DISSECTION Left 08/30/2018   Procedure: Left mastectomy with axillary lymph node dissection;  Surgeon: Rolm Bookbinder, MD;  Location: [place];  Service: General;  Laterality: Left;  . MYOMECTOMY ABDOMINAL APPROACH    . PORTACATH PLACEMENT N/A 03/30/2018   Procedure: INSERTION PORT-A-CATH WITH [place];  Surgeon: Rolm Bookbinder, MD;  Location: [place];  Service: General;  Laterality: N/A;    There were no vitals filed for this visit.  Subjective Assessment - 06/22/19 1604    Subjective  Pt has received her pump. She is getting her training on it next  week. She ordered the vest and it is in transit.    Pertinent History  S/p Lt modified radical mastectomy on 08/30/18 with Dr. [name].  SLNB x 12 lymph nodes all negative. Chemo and radiation completed. History of migraines.    Patient Stated Goals  improve the left side    Currently in Pain?  No/denies   just a little uncomfortable. I feel a little full under my arm   Pain Location  Back                       [facility] Adult PT Treatment/Exercise - 06/22/19 0001      Manual Therapy   Manual therapy comments  kept manual pressue on full area of axilla while perfoming MLD around it and pt seemed to get good relief with this.  Also did this technique at full area below incision     Myofascial Release  to anterior and lateral chest around scars with extra work on lifting skin around scar.     Manual Lymphatic Drainage (MLD)  In supine: Bil axillary nodes, Lt inguinal nodes, establish anterior inter-axillary anastomosis, established L axillo-inguinal anastomosis, Then focus on the left anterior and lateral chest and then performing all steps on the left arm.  Then reversing all steps.  Also in right sidelying posterior interaxillary work.  PT Long Term Goals - 05/18/19 1619      PT LONG TERM GOAL #1   Title  Pt will obtain appropriate compression for the left UE with instruction on use    Baseline  Pt has daytime sleeve and gauntlet but is waiting on nightime arm and glove and possible vest and compression pump    Status  On-going      PT LONG TERM GOAL #2   Title  Pt will increase Lt shoulder horizontal abduction to at least 29 deg    Baseline  16 on 04/06/19.90degrees on 05/18/2019    Status  Achieved      PT LONG TERM GOAL #3   Title  Pt will report only intermittent tightness in the left upper quadrant    Baseline  Pt is still having tightness but it is not as intense    Time  8    Period  Weeks    Status  On-going      PT LONG TERM GOAL #4    Title  Pt will be ind with final HEP    Status  Achieved      PT LONG TERM GOAL #5   Title  Pt will report she is confident in knowing how to manage her lymphedema    Time  8    Period  Weeks    Status  New      Additional Long Term Goals   Additional Long Term Goals  Yes      PT LONG TERM GOAL #6   Title  Pt will report that she has 50% more movement at left chest scar    Time  8    Period  Weeks    Status  New            Plan - 06/22/19 1650    Clinical Impression Statement  Pt with improved scar mobility at chest and had softening of fullness at axilla and below incision with manual pressure to these areas while doing MLD. Pt felt better at end of session.  She is awaiting training session of Flexi to see if it will be easier to use and is awaiting delivery of her compression vest.  Overall, making progress.    Comorbidities  node removal, radiation    Examination-Participation Restrictions  Cleaning;Community Activity;Yard Work    Dealer    PT Frequency  1x / week    PT Duration  8 weeks    PT Next Visit Plan  focus treatment on mobilizaion of scar and reduction of fullness at axilla, preventing lymhedema in her arm. continue  assess effect of foam. Cont myofascial release, scar mobilizaion and  MLD to the trunk and LUE  continue to help with garments and pump as needed       Patient will benefit from skilled therapeutic intervention in order to improve the following deficits and impairments:  Decreased skin integrity, Decreased range of motion, Decreased scar mobility, Decreased activity tolerance, Decreased knowledge of use of DME, Decreased strength, Impaired UE functional use, Pain, Increased edema  Visit Diagnosis: Abnormal posture  Stiffness of left shoulder, not elsewhere classified  Postmastectomy lymphedema  Acute pain of left shoulder     Problem List Patient  Active Problem List   Diagnosis Date Noted  . S/P mastectomy, left 08/30/2018  . 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 Bronchiectasis ([place]) 07/21/2018  . Genetic testing 05/03/2018  .  Port-A-Cath in place 04/07/2018  . Malignant neoplasm of upper-outer quadrant of left breast in female, estrogen receptor negative ([place]) 03/21/2018  . Migraine 11/21/2015  . Fibroids 08/22/2015   Donato Heinz. Owens Shark PT  Norwood Levo 06/22/2019, 4:53 PM  [place], [place], [address] Phone: [phone]   Fax:  [phone]  Name: Rhianne Fradette MRN: [id number] Date of Birth: [birth year]

## 2019-06-28 ENCOUNTER — Encounter: Payer: Self-pay | Admitting: Rehabilitation

## 2019-06-28 ENCOUNTER — Other Ambulatory Visit: Payer: Self-pay

## 2019-06-28 ENCOUNTER — Ambulatory Visit: Payer: BC Managed Care – PPO | Admitting: Rehabilitation

## 2019-06-28 DIAGNOSIS — R293 Abnormal posture: Secondary | ICD-10-CM | POA: Diagnosis not present

## 2019-06-28 DIAGNOSIS — M25612 Stiffness of left shoulder, not elsewhere classified: Secondary | ICD-10-CM

## 2019-06-28 DIAGNOSIS — C50412 Malignant neoplasm of upper-outer quadrant of left female breast: Secondary | ICD-10-CM

## 2019-06-28 DIAGNOSIS — I972 Postmastectomy lymphedema syndrome: Secondary | ICD-10-CM

## 2019-06-28 DIAGNOSIS — I890 Lymphedema, not elsewhere classified: Secondary | ICD-10-CM

## 2019-06-28 DIAGNOSIS — M25512 Pain in left shoulder: Secondary | ICD-10-CM

## 2019-06-28 NOTE — Therapy (Signed)
[facility] [address] [place], [place], [address] Phone: [phone]   Fax:  [phone]  Physical Therapy Treatment  Patient Details  Name: Paula Berry MRN: [id number] Date of Birth: 08/25/1970 Referring Provider (PT): Dr. Magrinat   Encounter Date: 06/28/2019  PT End of Session - 06/28/19 1200    Visit Number  14    Number of Visits  19    Date for PT Re-Evaluation  07/19/19    PT Start Time  0800   833 treatment   PT Stop Time  0855    PT Time Calculation (min)  55 min    Activity Tolerance  Patient tolerated treatment well    Behavior During Therapy  [facility] for tasks assessed/performed       Past Medical History:  Diagnosis Date  . Anemia    had iron infusion 08/12/15  . Asthma   . Breast cancer ([facility]) 03/2018   Left Breast Cancer  . Cancer ([facility])    Breast cancer   . Genital herpes   . GERD (gastroesophageal reflux disease)   . Headache    migraines 2-3 times monthly  . Pneumonia    hospitalizied for pneumonia as a child    Past Surgical History:  Procedure Laterality Date  . ABDOMINAL HYSTERECTOMY Bilateral 08/22/2015   Procedure: HYSTERECTOMY ABDOMINAL, BILATERAL SALPINGECTOMY;  Surgeon: James Tomblin, MD;  Location: [facility];  Service: Gynecology;  Laterality: Bilateral;  . MASTECTOMY Left 07/2018  . MASTECTOMY WITH AXILLARY LYMPH NODE DISSECTION Left 08/30/2018   Procedure: Left mastectomy with axillary lymph node dissection;  Surgeon: Wakefield, Matthew, MD;  Location: [facility];  Service: General;  Laterality: Left;  . MYOMECTOMY ABDOMINAL APPROACH    . PORTACATH PLACEMENT N/A 03/30/2018   Procedure: INSERTION PORT-A-CATH WITH [place];  Surgeon: Wakefield, Matthew, MD;  [facility];  Service: General;  Laterality: N/A;    There were no vitals filed for this visit.  Subjective Assessment - 06/28/19 1157    Subjective  I am doing fine    Pertinent History  S/p Lt  modified radical mastectomy on 08/30/18 with Dr. Wakefield.  SLNB x 12 lymph nodes all negative. Chemo and radiation completed. History of migraines.    Currently in Pain?  No/denies                       [facility] Adult PT Treatment/Exercise - 06/28/19 0001      Manual Therapy   Manual therapy comments  first 33minutes spent with flexitouch rep [name] to set up the machine and modifiy the axilla for more pressure here    Myofascial Release  to anterior and lateral chest around scars, Upper trap, latissimus and in axilla in neutral and with arm overehead for more stretch    Passive ROM  In Supine to Lt shoulder into flexion and abduction to pts tolerance                  PT Long Term Goals - 06/28/19 1203      PT LONG TERM GOAL #1   Title  Pt will obtain appropriate compression for the left UE with instruction on use    Baseline  has vest ordered    Status  Partially Met      PT LONG TERM GOAL #2   Title  Pt will increase Lt shoulder horizontal abduction to at least 29 deg              Plan - 06/28/19 1201    Clinical Impression Statement  Session spent with [name] from Flexitouch for appropriate compression pump training and set up with all questions answered and pt feeling more comfortable with the device.  The last portion of treatment spent performing STM for the left upper quadrant.  Some increased puffiness around the clavicle today but elminated with STM.    PT Frequency  1x / week    PT Treatment/Interventions  ADLs/Self Care Home Management;Manual techniques;Therapeutic exercise;Taping;Therapeutic activities;Patient/family education;Manual lymph drainage;Passive range of motion    PT Next Visit Plan  how was pump?  check ROM /goals , focus treatment on mobilizaion of scar and reduction of fullness at axilla, preventing lymhedema in her arm. continue  assess effect of foam. Cont myofascial release, scar mobilizaion and  MLD to the trunk and LUE  continue to  help with garments and pump as needed    Consulted and Agree with Plan of Care  Patient       Patient will benefit from skilled therapeutic intervention in order to improve the following deficits and impairments:     Visit Diagnosis: Abnormal posture  Stiffness of left shoulder, not elsewhere classified  Postmastectomy lymphedema  Acute pain of left shoulder  Lymphedema  Malignant neoplasm of upper-outer quadrant of left breast in female, estrogen receptor negative ([facility])     Problem List Patient Active Problem List   Diagnosis Date Noted  . S/P mastectomy, left 08/30/2018  . 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 Bronchiectasis ([facility]) 07/21/2018  . Genetic testing 05/03/2018  . Port-A-Cath in place 04/07/2018  . Malignant neoplasm of upper-outer quadrant of left breast in female, estrogen receptor negative ([facility]) 03/21/2018  . Migraine 11/21/2015  . Fibroids 08/22/2015    Tevis, Kara R 06/28/2019, 12:04 PM  [facility] [address] , [place], [address] Phone: [phone]   Fax:  [phone]  Name: Shaquoya Lorene Couzens MRN: [id number] Date of Birth: 05/12/1971   

## 2019-07-04 ENCOUNTER — Ambulatory Visit: Payer: BC Managed Care – PPO | Attending: Oncology | Admitting: Physical Therapy

## 2019-07-04 ENCOUNTER — Encounter: Payer: Self-pay | Admitting: Physical Therapy

## 2019-07-04 ENCOUNTER — Other Ambulatory Visit: Payer: Self-pay

## 2019-07-04 DIAGNOSIS — I972 Postmastectomy lymphedema syndrome: Secondary | ICD-10-CM | POA: Diagnosis present

## 2019-07-04 DIAGNOSIS — M25512 Pain in left shoulder: Secondary | ICD-10-CM | POA: Diagnosis present

## 2019-07-04 DIAGNOSIS — R293 Abnormal posture: Secondary | ICD-10-CM | POA: Diagnosis present

## 2019-07-04 DIAGNOSIS — M25612 Stiffness of left shoulder, not elsewhere classified: Secondary | ICD-10-CM

## 2019-07-04 DIAGNOSIS — I890 Lymphedema, not elsewhere classified: Secondary | ICD-10-CM

## 2019-07-04 NOTE — Therapy (Signed)
[place], [place], [address] Phone: [phone]   Fax:  [phone]  Physical Therapy Treatment  Patient Details  Name: Paula Berry MRN: [id number] Date of Birth: 10-Feb-1971 Referring Provider (PT): Dr. Jana Hakim   Encounter Date: 07/04/2019  PT End of Session - 07/04/19 1656    Visit Number  15    Number of Visits  19    Date for PT Re-Evaluation  07/19/19    PT Start Time  1600    PT Stop Time  1645    PT Time Calculation (min)  45 min    Activity Tolerance  Patient tolerated treatment well    Behavior During Therapy  [facility] for tasks assessed/performed       Past Medical History:  Diagnosis Date  . Anemia    had iron infusion 08/12/15  . Asthma   . Breast cancer ([place]) 03/2018   Left Breast Cancer  . Cancer [facility])    Breast cancer   . Genital herpes   . GERD (gastroesophageal reflux disease)   . Headache    migraines 2-3 times monthly  . Pneumonia    hospitalizied for pneumonia as a child    Past Surgical History:  Procedure Laterality Date  . ABDOMINAL HYSTERECTOMY Bilateral 08/22/2015   Procedure: HYSTERECTOMY ABDOMINAL, BILATERAL SALPINGECTOMY;  Surgeon: Everlene Farrier, MD;  Location: [facility];  Service: Gynecology;  Laterality: Bilateral;  . MASTECTOMY Left 07/2018  . MASTECTOMY WITH AXILLARY LYMPH NODE DISSECTION Left 08/30/2018   Procedure: Left mastectomy with axillary lymph node dissection;  Surgeon: Rolm Bookbinder, MD;  Location: [facility];  Service: General;  Laterality: Left;  . MYOMECTOMY ABDOMINAL APPROACH    . PORTACATH PLACEMENT N/A 03/30/2018   Procedure: INSERTION PORT-A-CATH WITH [place];  Surgeon: Rolm Bookbinder, MD;  Location: [facility];  Service: General;  Laterality: N/A;    There were no vitals filed for this visit.  Subjective Assessment - 07/04/19 1602    Subjective  Pt feels better about using the Flexitouch and has been using it  every day.  She feels that she feels a lttle lighter.  She got the a Lelan Pons but it is only a vest and does not provide the compression in the axilla where she needs it. She forgot to bring it in today    Pertinent History  S/p Lt modified radical mastectomy on 08/30/18 with Dr. [name].  SLNB x 12 lymph nodes all negative. Chemo and radiation completed. History of migraines.    Patient Stated Goals  improve the left side    Currently in Pain?  Yes    Pain Score  2     Pain Location  Axilla                       [facility] Adult PT Treatment/Exercise - 07/04/19 0001      Manual Therapy   Myofascial Release  to anterior and lateral chest around scars with extra work on lifting skin around scar.     Manual Lymphatic Drainage (MLD)  In supine: Bil axillary nodes, Lt inguinal nodes, establish anterior inter-axillary anastomosis, established L axillo-inguinal anastomosis, Then focus on the left anterior and lateral chest and then performing all steps on the left arm.  Then reversing all steps.  Also in right sidelying posterior interaxillary work.                   PT Long Term  Goals - 06/28/19 1203      PT LONG TERM GOAL #1   Title  Pt will obtain appropriate compression for the left UE with instruction on use    Baseline  has vest ordered    Status  Partially Met      PT LONG TERM GOAL #2   Title  Pt will increase Lt shoulder horizontal abduction to at least 29 deg            Plan - 07/04/19 1656    Clinical Impression Statement  Pt reports she feels better after session with less pain and more movment.  Her anterior chest scar has more tissue excursion and the fullness under this scar is less congested today.  She still has congestion in her axilla with very dark, black tissue over the full area still from radiation. Sent an email to [facility] to see if she can get a sleeve put on her vest to provide compression to her axilla and posterior shoulder     Comorbidities  node removal, radiation    Examination-Participation Restrictions  Cleaning;Community Activity;Yard Work    PT Frequency  1x / week    PT Duration  8 weeks    PT Treatment/Interventions  ADLs/Self Care Home Management;Manual techniques;Therapeutic exercise;Taping;Therapeutic activities;Patient/family education;Manual lymph drainage;Passive range of motion    PT Next Visit Plan  check ROM /goals , focus treatment on mobilizaion of scar and reduction of fullness at axilla, preventing lymhedema in her arm. continue  assess effect of foam. Cont myofascial release, scar mobilizaion and  MLD to the trunk and LUE  continue to help with garments and pump as needed       Patient will benefit from skilled therapeutic intervention in order to improve the following deficits and impairments:  Decreased skin integrity, Decreased range of motion, Decreased scar mobility, Decreased activity tolerance, Decreased knowledge of use of DME, Decreased strength, Impaired UE functional use, Pain, Increased edema  Visit Diagnosis: Abnormal posture  Stiffness of left shoulder, not elsewhere classified  Postmastectomy lymphedema  Acute pain of left shoulder  Lymphedema     Problem List Patient Active Problem List   Diagnosis Date Noted  . S/P mastectomy, left 08/30/2018  . 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 Bronchiectasis ([place]) 07/21/2018  . Genetic testing 05/03/2018  . Port-A-Cath in place 04/07/2018  . Malignant neoplasm of upper-outer quadrant of left breast in female, estrogen receptor negative ([place]) 03/21/2018  . Migraine 11/21/2015  . Fibroids 08/22/2015   Donato Heinz. Owens Shark PT  [place] 07/04/2019, [place], [place], [address] Phone: [phone]   Fax:  [phone]  Name: Hildy Nicholl MRN: [id number] Date of Birth: 1971-01-16

## 2019-07-10 ENCOUNTER — Ambulatory Visit: Payer: BC Managed Care – PPO

## 2019-07-18 ENCOUNTER — Other Ambulatory Visit: Payer: Self-pay

## 2019-07-18 ENCOUNTER — Ambulatory Visit: Payer: BC Managed Care – PPO | Admitting: Physical Therapy

## 2019-07-18 ENCOUNTER — Encounter: Payer: Self-pay | Admitting: Physical Therapy

## 2019-07-18 DIAGNOSIS — M25512 Pain in left shoulder: Secondary | ICD-10-CM

## 2019-07-18 DIAGNOSIS — R293 Abnormal posture: Secondary | ICD-10-CM

## 2019-07-18 DIAGNOSIS — M25612 Stiffness of left shoulder, not elsewhere classified: Secondary | ICD-10-CM

## 2019-07-18 DIAGNOSIS — I890 Lymphedema, not elsewhere classified: Secondary | ICD-10-CM

## 2019-07-18 DIAGNOSIS — I972 Postmastectomy lymphedema syndrome: Secondary | ICD-10-CM

## 2019-07-18 NOTE — Therapy (Addendum)
[place], [place], [address] Phone: [phone]   Fax:  [phone]  Physical Therapy Treatment  Patient Details  Name: Paula Berry MRN: [id number] Date of Birth: 1970-08-25 Referring Provider (PT): Dr. Jana Hakim   Encounter Date: 07/18/2019  PT End of Session - 07/18/19 1654    Visit Number  16    Number of Visits  19    Date for PT Re-Evaluation  07/19/19    PT Start Time  3007    PT Stop Time  1645    PT Time Calculation (min)  40 min    Activity Tolerance  Patient tolerated treatment well    Behavior During Therapy  [facility] for tasks assessed/performed       Past Medical History:  Diagnosis Date  . Anemia    had iron infusion 08/12/15  . Asthma   . Breast cancer ([place]) 03/2018   Left Breast Cancer  . Cancer [facility])    Breast cancer   . Genital herpes   . GERD (gastroesophageal reflux disease)   . Headache    migraines 2-3 times monthly  . Pneumonia    hospitalizied for pneumonia as a child    Past Surgical History:  Procedure Laterality Date  . ABDOMINAL HYSTERECTOMY Bilateral 08/22/2015   Procedure: HYSTERECTOMY ABDOMINAL, BILATERAL SALPINGECTOMY;  Surgeon: Everlene Farrier, MD;  Location: [facility];  Service: Gynecology;  Laterality: Bilateral;  . MASTECTOMY Left 07/2018  . MASTECTOMY WITH AXILLARY LYMPH NODE DISSECTION Left 08/30/2018   Procedure: Left mastectomy with axillary lymph node dissection;  Surgeon: Rolm Bookbinder, MD;  Location: [facility];  Service: General;  Laterality: Left;  . MYOMECTOMY ABDOMINAL APPROACH    . PORTACATH PLACEMENT N/A 03/30/2018   Procedure: INSERTION PORT-A-CATH WITH [place];  Surgeon: Rolm Bookbinder, MD;  Location: [facility];  Service: General;  Laterality: N/A;    There were no vitals filed for this visit.  Subjective Assessment - 07/18/19 1650    Subjective  Pt is using her Felxitouch daily and feels better after she uses it.   She feels less congestion in her chest. She brings in her Tribute vest today    Pertinent History  S/p Lt modified radical mastectomy on 08/30/18 with Dr. [name].  SLNB x 12 lymph nodes all negative. Chemo and radiation completed. History of migraines.    Patient Stated Goals  improve the left side, on 07/28/2019, pt feels this is achieved    Currently in Pain?  No/denies         [facility] PT Assessment - 07/18/19 0001      Palpation   Palpation comment  good skin exursion around the scar on chest.  pt still with fullness in axilla         LYMPHEDEMA/ONCOLOGY QUESTIONNAIRE - 07/18/19 1625      Left Upper Extremity Lymphedema   15 cm Proximal to Olecranon Process  36.8 cm    10 cm Proximal to Olecranon Process  34.7 cm    Olecranon Process  26.6 cm    15 cm Proximal to Ulnar Styloid Process  25.9 cm    10 cm Proximal to Ulnar Styloid Process  21.6 cm    Just Proximal to Ulnar Styloid Process  16.2 cm    Across Hand at [employer]  18.2 cm    At [place] of 2nd Digit  5.8 cm  [place] Adult PT Treatment/Exercise - 07/18/19 0001      Manual Therapy   Edema Management  pt tried Tribute vest on with and without prosthesis.  The armholes are too big and do not provide the compression where she needs it.  However, with the elastic pulled up and the vest folded over, there is potential for her to get the compression where she needs it.  The vest needs to be send back to have this alteration made. contacted [facility] to have this adjustment made                            PT Long Term Goals - 07/18/19 1611      PT LONG TERM GOAL #1   Title  Pt will obtain appropriate compression for the left UE with instruction on use    Baseline  vest arrived, but needs to be modified as it does not fit right    Time  6    Period  Weeks    Status  Partially Met      PT LONG TERM GOAL #2   Title  Pt will increase Lt shoulder horizontal abduction to at least 29  deg    Baseline  16 on 04/06/19.90degrees on 05/18/2019    Status  Achieved      PT LONG TERM GOAL #3   Title  Pt will report only intermittent tightness in the left upper quadrant    Baseline  has not had tightness in about 2 weeks    Status  Achieved      PT LONG TERM GOAL #4   Title  Pt will be ind with final HEP    Status  Achieved      PT LONG TERM GOAL #5   Title  Pt will report she is confident in knowing how to manage her lymphedema    Status  Achieved      PT LONG TERM GOAL #6   Title  Pt will report that she has 50% more movement at left chest scar    Status  Achieved            Plan - 07/18/19 1655    Clinical Impression Statement  Pt has made improvements wtih less tightness in chest and axilla.  Circumference measurement of arm are stable.  She is using the Flexitouch for self care and is in process of getting her compression vest fit properly to address axillary lymphedema.  She will continue on her own at home with flexitouch and exercise and will call for appt when she received modified vest for another check of fit prior to final discharge    Personal Factors and Comorbidities  Comorbidity 2    Comorbidities  node removal, radiation    Examination-Activity Limitations  Lift;Carry;Reach Overhead    Stability/Clinical Decision Making  Evolving/Moderate complexity    Rehab Potential  Excellent    PT Frequency  1x / week    PT Duration  8 weeks    PT Treatment/Interventions  ADLs/Self Care Home Management;Manual techniques;Therapeutic exercise;Taping;Therapeutic activities;Patient/family education;Manual lymph drainage;Passive range of motion    PT Next Visit Plan  check fit of tribute, discharge if all ok    PT Home Exercise Plan  Access Code: [facility]       Patient will benefit from skilled therapeutic intervention in order to improve the following deficits and impairments:  Decreased skin integrity, Decreased range of motion, Decreased scar  mobility,  Decreased activity tolerance, Decreased knowledge of use of DME, Decreased strength, Impaired UE functional use, Pain, Increased edema  Visit Diagnosis: Abnormal posture  Stiffness of left shoulder, not elsewhere classified  Postmastectomy lymphedema  Acute pain of left shoulder  Lymphedema     Problem List Patient Active Problem List   Diagnosis Date Noted  . S/P mastectomy, left 08/30/2018  . 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 Bronchiectasis ([place]) 07/21/2018  . Genetic testing 05/03/2018  . Port-A-Cath in place 04/07/2018  . Malignant neoplasm of upper-outer quadrant of left breast in female, estrogen receptor negative ([place]) 03/21/2018  . Migraine 11/21/2015  . Fibroids 08/22/2015   Donato Heinz. Owens Shark PT  [place] [facility]o 07/18/2019, 5:00 PM  [place], [place], [address] Phone: [phone]   Fax:  [phone]  Name: Paula Berry MRN: [id number] Date of Birth: [birth year]  PHYSICAL THERAPY DISCHARGE SUMMARY  Visits from Start of Care: 16  Current functional level related to goals / functional outcomes: unknown   Remaining deficits:unknown  Education / Equipment: Lymphedema management  Plan: Patient agrees to discharge.  Patient goals were not met. Patient is being discharged due to not returning since the last visit.  ?????    Donato Heinz. Owens Shark, PT

## 2019-07-22 IMAGING — DX DG CHEST 2V
2 series · 2 of 2 positions shown · non-contrast
Comparison: None.

CLINICAL DATA: Cough and shortness of breath. The patient is being
treated with chemotherapy for a left breast cancer.

EXAM:
CHEST - 2 VIEW

[chest pa]
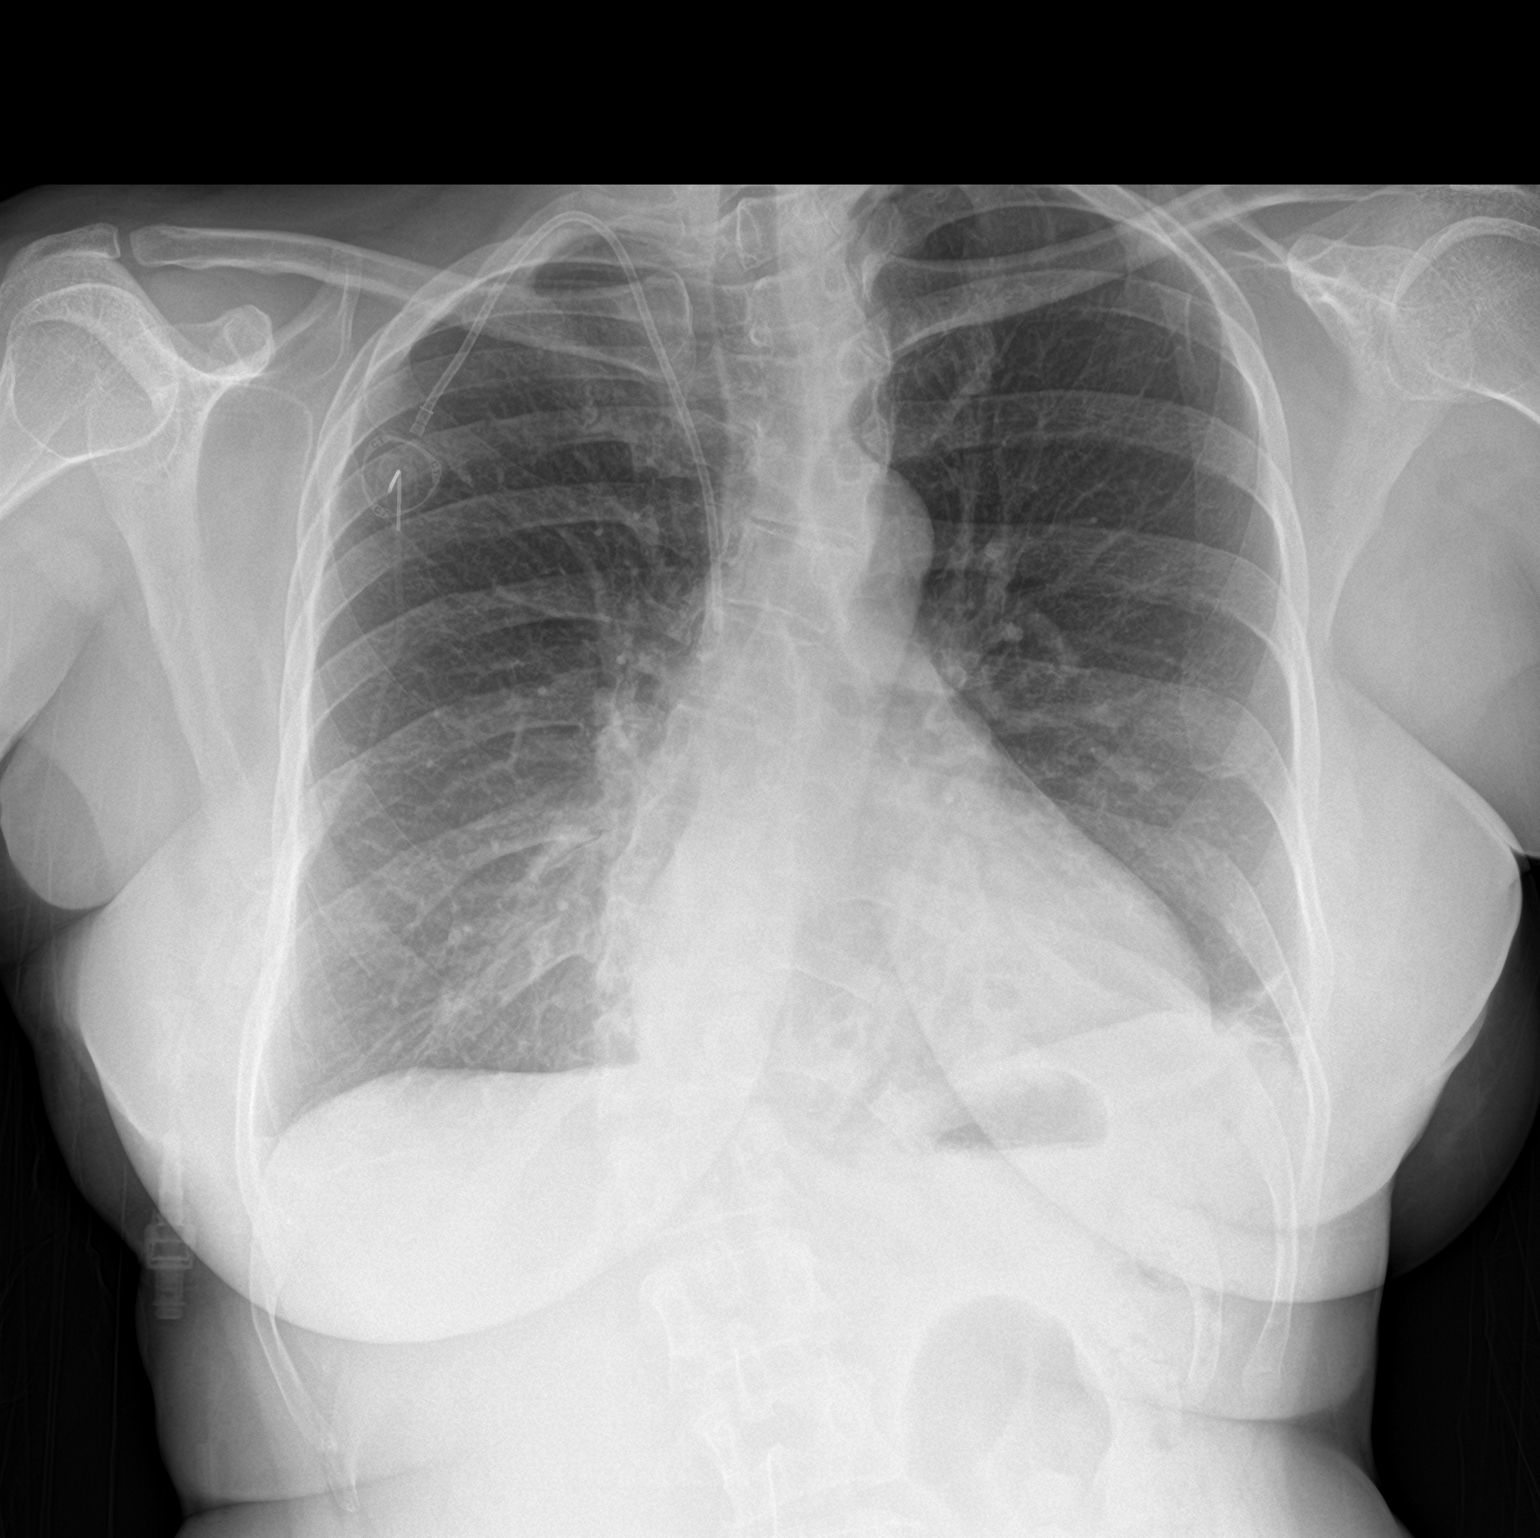

[chest lat]
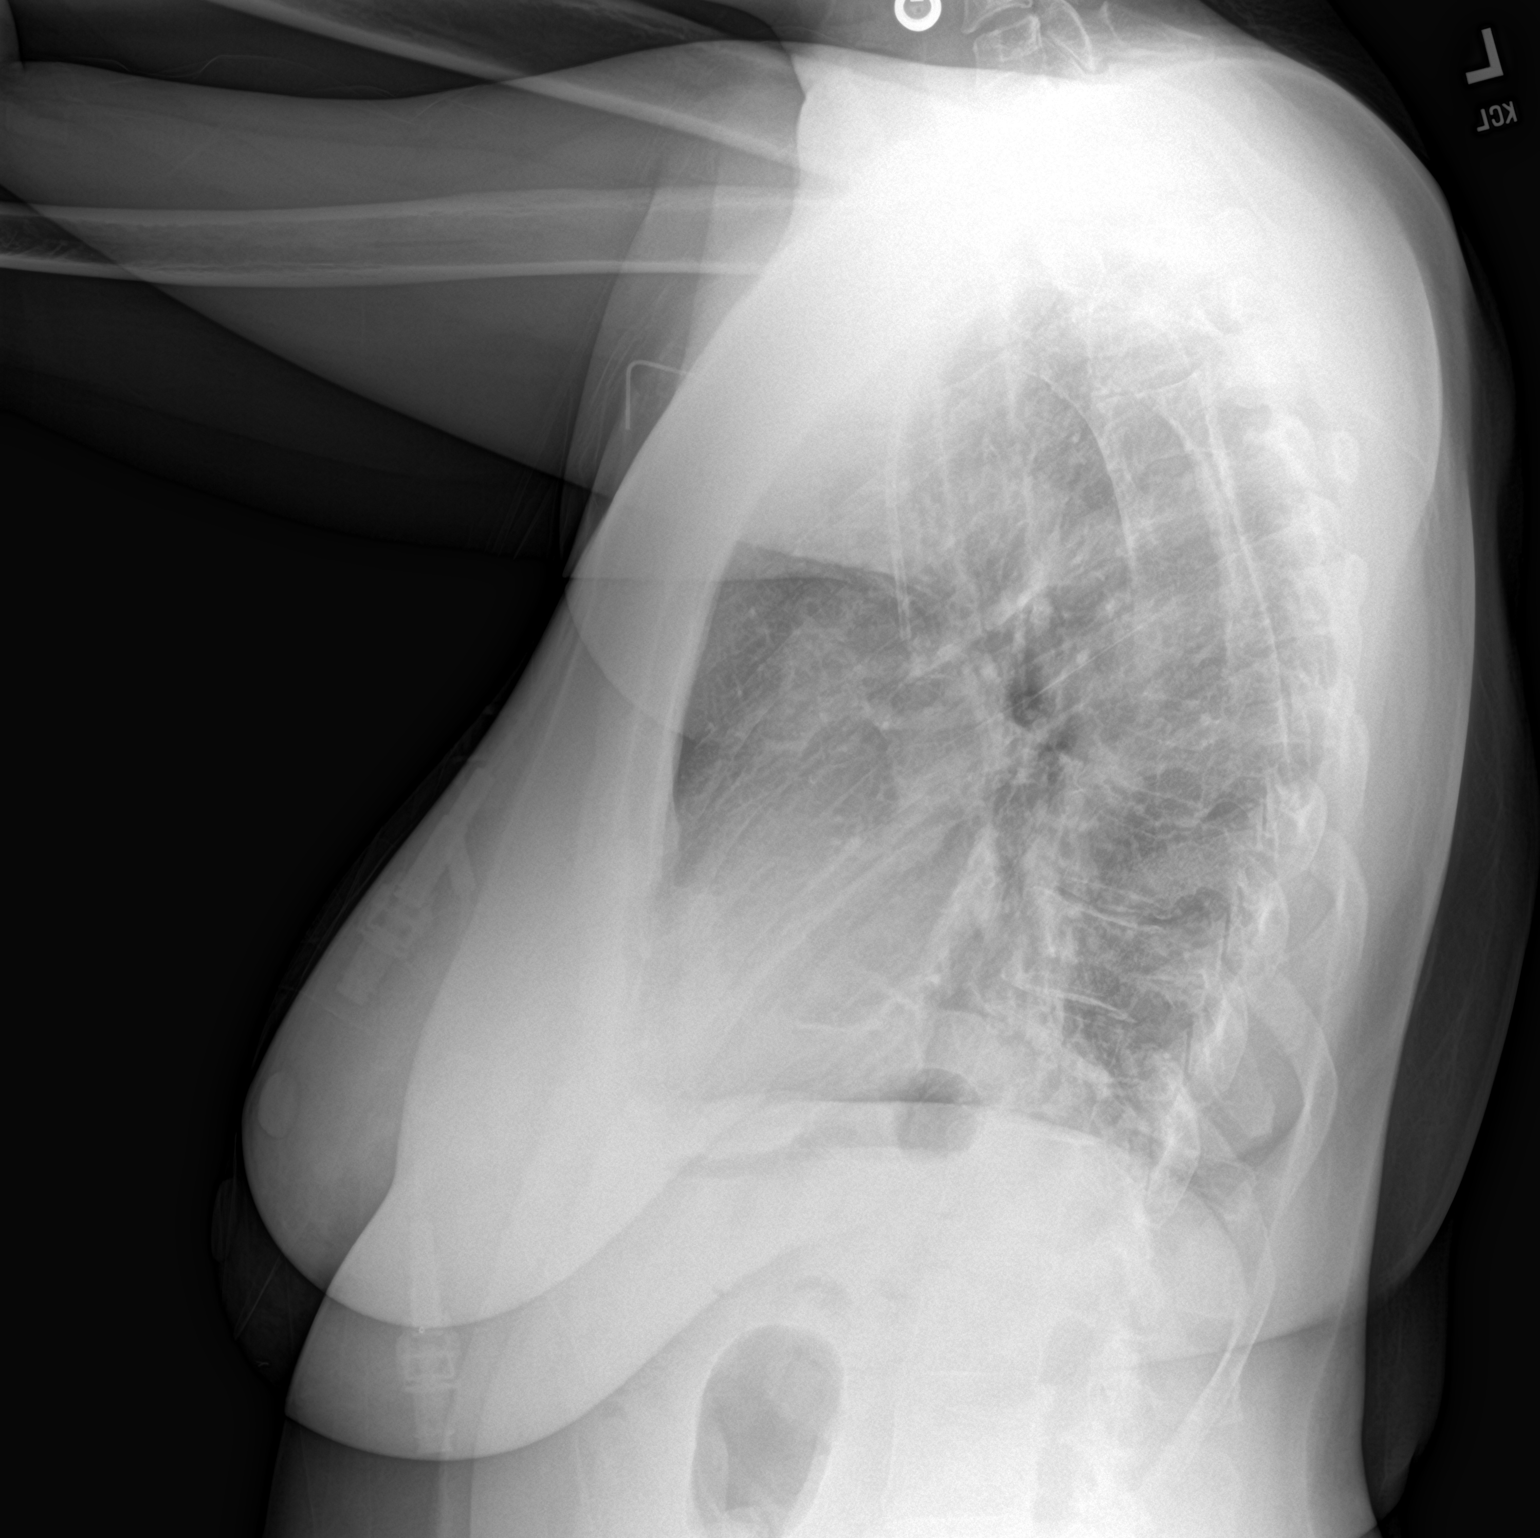

[2 of 2 positions shown; findings below may reference images not displayed]

FINDINGS: Right-sided injectable port in stable position.

Cardiomediastinal silhouette is normal. Mediastinal contours appear
intact.

There is no evidence of acute focal airspace consolidation, pleural
effusion or pneumothorax. Left lower lobe varicoid bronchiectasis
with peribronchial thickening and subpleural scarring is stable from
the prior CT.

Osseous structures are without acute abnormality. Soft tissues are
grossly normal.
IMPRESSION: No active cardiopulmonary disease.

Chronic varicoid left lower lobe bronchiectasis and peribronchial
thickening/scarring.

## 2019-07-25 ENCOUNTER — Encounter: Payer: Self-pay | Admitting: Adult Health

## 2019-07-25 ENCOUNTER — Inpatient Hospital Stay: Payer: BC Managed Care – PPO | Attending: Adult Health | Admitting: Adult Health

## 2019-07-25 ENCOUNTER — Other Ambulatory Visit: Payer: Self-pay

## 2019-07-25 VITALS — BP 100/68 | HR 97 | Temp 97.8°F | Resp 18 | Ht 70.0 in | Wt 240.2 lb

## 2019-07-25 DIAGNOSIS — Z9079 Acquired absence of other genital organ(s): Secondary | ICD-10-CM | POA: Insufficient documentation

## 2019-07-25 DIAGNOSIS — Z923 Personal history of irradiation: Secondary | ICD-10-CM | POA: Diagnosis not present

## 2019-07-25 DIAGNOSIS — Z79899 Other long term (current) drug therapy: Secondary | ICD-10-CM | POA: Insufficient documentation

## 2019-07-25 DIAGNOSIS — Z90710 Acquired absence of both cervix and uterus: Secondary | ICD-10-CM | POA: Diagnosis not present

## 2019-07-25 DIAGNOSIS — Z9221 Personal history of antineoplastic chemotherapy: Secondary | ICD-10-CM | POA: Diagnosis not present

## 2019-07-25 DIAGNOSIS — Z7951 Long term (current) use of inhaled steroids: Secondary | ICD-10-CM | POA: Insufficient documentation

## 2019-07-25 DIAGNOSIS — Z833 Family history of diabetes mellitus: Secondary | ICD-10-CM | POA: Insufficient documentation

## 2019-07-25 DIAGNOSIS — Z171 Estrogen receptor negative status [ER-]: Secondary | ICD-10-CM | POA: Insufficient documentation

## 2019-07-25 DIAGNOSIS — Z9012 Acquired absence of left breast and nipple: Secondary | ICD-10-CM | POA: Diagnosis not present

## 2019-07-25 DIAGNOSIS — J45909 Unspecified asthma, uncomplicated: Secondary | ICD-10-CM | POA: Insufficient documentation

## 2019-07-25 DIAGNOSIS — Z853 Personal history of malignant neoplasm of breast: Secondary | ICD-10-CM | POA: Insufficient documentation

## 2019-07-25 DIAGNOSIS — Z90722 Acquired absence of ovaries, bilateral: Secondary | ICD-10-CM | POA: Insufficient documentation

## 2019-07-25 DIAGNOSIS — Z808 Family history of malignant neoplasm of other organs or systems: Secondary | ICD-10-CM | POA: Diagnosis not present

## 2019-07-25 DIAGNOSIS — Z8249 Family history of ischemic heart disease and other diseases of the circulatory system: Secondary | ICD-10-CM | POA: Insufficient documentation

## 2019-07-25 DIAGNOSIS — C50412 Malignant neoplasm of upper-outer quadrant of left female breast: Secondary | ICD-10-CM | POA: Diagnosis not present

## 2019-07-25 DIAGNOSIS — Z801 Family history of malignant neoplasm of trachea, bronchus and lung: Secondary | ICD-10-CM | POA: Diagnosis not present

## 2019-07-25 NOTE — Progress Notes (Signed)
SURVIVORSHIP VISIT:    BRIEF ONCOLOGIC HISTORY:  Oncology History  Malignant neoplasm of upper-outer quadrant of left breast in female, estrogen receptor negative ([place])  03/21/2018 Initial Diagnosis   Malignant neoplasm of upper-outer quadrant of left breast in female, estrogen receptor negative ([place])   04/07/2018 - 08/10/2018 Chemotherapy   DOXOrubicin (ADRIAMYCIN) chemo injection 130 mg, 60 mg/m2 = 130 mg, Intravenous,  Once, 4 of 4 cycles. Administration: 130 mg (04/07/2018), 130 mg (04/21/2018), 130 mg (05/05/2018), 130 mg (05/19/2018)  palonosetron (ALOXI) injection 0.25 mg, 0.25 mg, Intravenous,  Once, 14 of 16 cycles. Administration: 0.25 mg (04/07/2018), 0.25 mg (06/02/2018), 0.25 mg (04/21/2018), 0.25 mg (05/05/2018), 0.25 mg (05/19/2018), 0.25 mg (06/09/2018), 0.25 mg (06/16/2018), 0.25 mg (06/23/2018), 0.25 mg (06/30/2018), 0.25 mg (07/07/2018), 0.25 mg (07/14/2018), 0.25 mg (07/21/2018), 0.25 mg (07/28/2018), 0.25 mg (08/04/2018)  pegfilgrastim-cbqv (UDENYCA) injection 6 mg, 6 mg, Subcutaneous, Once, 4 of 4 cycles. Administration: 6 mg (04/09/2018), 6 mg (04/23/2018), 6 mg (05/07/2018), 6 mg (05/21/2018)  CARBOplatin (PARAPLATIN) 300 mg in sodium chloride 0.9 % 250 mL chemo infusion, 300 mg (100 % of original dose 300 mg), Intravenous,  Once, 10 of 12 cycles. Dose modification:   (original dose 300 mg, Cycle 5). Administration: 300 mg (06/02/2018), 300 mg (06/09/2018), 300 mg (06/16/2018), 300 mg (06/23/2018), 300 mg (06/30/2018), 300 mg (07/07/2018), 300 mg (07/14/2018), 300 mg (07/21/2018), 300 mg (07/28/2018), 300 mg (08/04/2018)  cyclophosphamide (CYTOXAN) 1,300 mg in sodium chloride 0.9 % 250 mL chemo infusion, 600 mg/m2 = 1,300 mg, Intravenous,  Once, 4 of 4 cycles. Administration: 1,300 mg (04/07/2018), 1,300 mg (04/21/2018), 1,300 mg (05/05/2018), 1,300 mg (05/19/2018)  PACLitaxel (TAXOL) 174 mg in sodium chloride 0.9 % 250 mL chemo infusion (</= 95m/m2), 80 mg/m2 = 174 mg, Intravenous,  Once, 10 of 12 cycles.  Administration: 174 mg (06/02/2018), 174 mg (06/09/2018), 174 mg (06/16/2018), 174 mg (06/23/2018), 174 mg (06/30/2018), 174 mg (07/07/2018), 174 mg (07/14/2018), 174 mg (07/21/2018), 174 mg (07/28/2018), 174 mg (08/04/2018)  fosaprepitant (EMEND) 150 mg  dexamethasone (DECADRON) 12 mg in sodium chloride 0.9 % 145 mL IVPB, , Intravenous,  Once, 14 of 16 cycles. Administration:  (04/07/2018),  (06/02/2018),  (04/21/2018),  (05/05/2018),  (05/19/2018),  (06/09/2018),  (06/16/2018),  (06/23/2018),  (06/30/2018),  (07/07/2018),  (07/14/2018),  (07/21/2018),  (07/28/2018),  (08/04/2018)    04/27/2018 Genetic Testing   Negative genetic testing on the multi-cancer panel.  The Multi-Gene Panel offered by [facility] includes sequencing and/or deletion duplication testing of the following 84 genes: AIP, ALK, APC, ATM, AXIN2,BAP1,  BARD1, BLM, BMPR1A, BRCA1, BRCA2, BRIP1, CASR, CDC73, CDH1, CDK4, CDKN1B, CDKN1C, CDKN2A (p14ARF), CDKN2A (p16INK4a), CEBPA, CHEK2, CTNNA1, DICER1, DIS3L2, EGFR (c.2369C>T, p.Thr790Met variant only), EPCAM (Deletion/duplication testing only), FH, FLCN, GATA2, GPC3, GREM1 (Promoter region deletion/duplication testing only), HOXB13 (c.251G>A, p.Gly84Glu), HRAS, KIT, MAX, MEN1, MET, MITF (c.952G>A, p.Glu318Lys variant only), MLH1, MSH2, MSH3, MSH6, MUTYH, NBN, NF1, NF2, NTHL1, PALB2, PDGFRA, PHOX2B, PMS2, POLD1, POLE, POT1, PRKAR1A, PTCH1, PTEN, RAD50, RAD51C, RAD51D, RB1, RECQL4, RET, RUNX1, SDHAF2, SDHA (sequence changes only), SDHB, SDHC, SDHD, SMAD4, SMARCA4, SMARCB1, SMARCE1, STK11, SUFU, TERC, TERT, TMEM127, TP53, TSC1, TSC2, VHL, WRN and WT1.  The report date is April 27, 2018.   08/24/2018 Cancer Staging   Staging form: Breast, AJCC 8th Edition - Pathologic: No Stage Recommended (ypT1a, pN0, cM0, G2, ER-, PR-, HER2-) - Signed by SEppie Gibson MD on 09/28/2018   08/30/2018 Surgery   Left mastectomy (Paula Berry [phone] revealed residual IDC, grade 2, negative margins. 12 axillary lymph nodes were  negative  for carcinoma. Triple negative, MIB-1 of 30%.   10/26/2018 - 11/22/2018 Radiation Therapy   The patient initially received a dose of 50.4 Gy in 28 fractions to the breast using whole-breast tangent fields. This was delivered using a 3-D conformal technique. The pt received a boost delivering an additional 10 Gy in 5 fractions using a electron boost with 49mV electrons. The total dose was 60.4 Gy.     INTERVAL HISTORY:  Paula Berry review her survivorship care plan detailing her treatment course for breast cancer, as well as monitoring long-term side effects of that treatment, education regarding health maintenance, screening, and overall wellness and health promotion.     Overall, Ms. MCheyenne Bordeauxreports feeling quite well.  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  She is following pandemic precautions.  She notes she has gained weight and is starting a healthy diet and exercise.  She notes a small new rash on her right upper chest wall.  These do not itch and recently appeared.  MEmellyhas developed lymphedema and has been following with PT about this.  She is using a flexitouch and is being fitted for a brace.    REVIEW OF SYSTEMS:  Review of Systems  Constitutional: Positive for fatigue. Negative for appetite change, chills, fever and unexpected weight change.  HENT:   Negative for hearing loss, lump/mass and trouble swallowing.   Eyes: Negative for eye problems and icterus.  Respiratory: Negative for chest tightness, cough and shortness of breath.   Cardiovascular: Negative for chest pain, leg swelling and palpitations.  Gastrointestinal: Negative for abdominal distention, abdominal pain, constipation, diarrhea, nausea and vomiting.  Endocrine: Negative for hot flashes.  Genitourinary: Negative for difficulty urinating.   Musculoskeletal: Negative for arthralgias.  Skin: Negative for itching and rash.  Neurological: Negative  for dizziness, extremity weakness, headaches and numbness.  Hematological: Negative for adenopathy. Does not bruise/bleed easily.  Psychiatric/Behavioral: Negative for depression. The patient is not nervous/anxious.    Breast: Denies any new nodularity, masses, tenderness, nipple changes, or nipple discharge.      ONCOLOGY TREATMENT TEAM:  1. Surgeon:  [facility]ne [facility]at [facility] 2. Medical Oncologist: Dr. MJana Hakim 3. Radiation Oncologist: Dr. SIsidore Moos   PAST MEDICAL/SURGICAL HISTORY:  Past Medical History:  Diagnosis Date  . Anemia    had iron infusion 08/12/15  . Asthma   . Breast cancer ([place] 03[place]   Left Breast Cancer  . Cancer ([facility]    Breast cancer   . Genital herpes   . GERD (gastroesophageal reflux disease)   . Headache    migraines 2-3 times monthly  . Pneumonia    hospitalizied for pneumonia as a child   Past Surgical History:  Procedure Laterality Date  . ABDOMINAL HYSTERECTOMY Bilateral 08/22/2015   Procedure: HYSTERECTOMY ABDOMINAL, BILATERAL SALPINGECTOMY;  Surgeon: JEverlene Farrier MD;  [facility];  Service: Gynecology;  Laterality: Bilateral;  . MASTECTOMY Left 07/2018  . MASTECTOMY WITH AXILLARY LYMPH NODE DISSECTION Left 08/30/2018   Procedure: Left mastectomy with axillary lymph node dissection;  Surgeon: WRolm Bookbinder MD;  [place] [facility]  Service: General;  Laterality: Left;  . MYOMECTOMY ABDOMINAL APPROACH    . PORTACATH PLACEMENT N/A 03/30/2018   Procedure: INSERTION PORT-A-CATH WITH [place]  Surgeon: WRolm Bookbinder MD;  [place] [facility]  Service: General;  Laterality: N/A;     ALLERGIES:  Allergies  Allergen Reactions  .  Neosporin [Neomycin-Bacitracin Zn-Polymyx] Other (See Comments)    Decreased wound healing with blisters     CURRENT MEDICATIONS:  Outpatient Encounter Medications as of 07/25/2019  Medication Sig Note  . acetaminophen (TYLENOL) 500 MG tablet Take 1,000 mg by mouth every 8  (eight) hours as needed for moderate pain.   Marland Kitchen acyclovir ointment (ZOVIRAX) 5 % Apply 1 application topically 4 (four) times daily as needed (outbreaks).    Marland Kitchen albuterol (PROVENTIL HFA;VENTOLIN HFA) 108 (90 Base) MCG/ACT inhaler Inhale 2 puffs into the lungs every 6 (six) hours as needed for wheezing or shortness of breath.    . gabapentin (NEURONTIN) 300 MG capsule Take 1 capsule (300 mg total) by mouth at bedtime.   . methocarbamol (ROBAXIN) 500 MG tablet Take 1 tablet (500 mg total) by mouth 3 (three) times daily.   . montelukast (SINGULAIR) 10 MG tablet Take 10 mg by mouth every evening.   . mupirocin ointment (BACTROBAN) 2 % Apply to peeling skin BID-TID PRN   . oxyCODONE (OXY IR/ROXICODONE) 5 MG immediate release tablet Take 1 tablet (5 mg total) by mouth every 4 (four) hours as needed for moderate pain.   . pantoprazole (PROTONIX) 40 MG tablet Take 40 mg by mouth every evening.   . SYMBICORT 160-4.5 MCG/ACT inhaler INL 2 PFS PO BID   . valACYclovir (VALTREX) 500 MG tablet Take 500 mg by mouth See admin instructions. Take 500 mg twice daily for 3 days at onset of outbreak then take 500 mg once daily until clear 08/23/2018: As needed  . [DISCONTINUED] prochlorperazine (COMPAZINE) 10 MG tablet Take 1 tablet (10 mg total) by mouth every 6 (six) hours as needed (Nausea or vomiting).    No facility-administered encounter medications on file as of 07/25/2019.     ONCOLOGIC FAMILY HISTORY:  Family History  Problem Relation Age of Onset  . Hypertension Other   . Diabetes Cousin        mat first cousin  . Cancer Other   . Dementia Maternal Grandfather   . Lung cancer Paternal Grandmother        d. 37-60  . Brain cancer Paternal Aunt        d. 68, father's mat 1/2 sister  . Diabetes Maternal Grandmother   . Other Paternal Grandfather        d. WWII  . Aneurysm Maternal Uncle        brain  . Migraines Neg Hx      GENETIC COUNSELING/TESTING: See above  SOCIAL HISTORY:  Social History    Socioeconomic History  . Marital status: Married    Spouse name: Edd Arbour  . Number of children: 0  . Years of education: 31  . Highest education level: Not on file  Occupational History  . Occupation: [employer]  Tobacco Use  . Smoking status: Never Smoker  . Smokeless tobacco: Never Used  Substance and Sexual Activity  . Alcohol use: Yes    Alcohol/week: 1.0 standard drinks    Types: 1 Glasses of wine per week    Comment: daily  . Drug use: No  . Sexual activity: Not on file  Other Topics Concern  . Not on file  Social History Narrative   Lives with spouse   Caffeine use: 1 cup coffee/day (<5 days per week)   No soda often, unless she has a headache (2x/month)   Tea- rarely    Social Determinants of Health   Financial Resource Strain:   . Difficulty of  Paying Living Expenses: Not on file  Food Insecurity:   . Worried About Charity fundraiser in the Last Year: Not on file  . Ran Out of Food in the Last Year: Not on file  Transportation Needs: No Transportation Needs  . Lack of Transportation (Medical): No  . Lack of Transportation (Non-Medical): No  Physical Activity:   . Days of Exercise per Week: Not on file  . Minutes of Exercise per Session: Not on file  Stress:   . Feeling of Stress : Not on file  Social Connections:   . Frequency of Communication with Friends and Family: Not on file  . Frequency of Social Gatherings with Friends and Family: Not on file  . Attends Religious Services: Not on file  . Active Member of Clubs or Organizations: Not on file  . Attends Archivist Meetings: Not on file  . Marital Status: Not on file  Intimate Partner Violence: Unknown  . Fear of Current or Ex-Partner: No  . Emotionally Abused: No  . Physically Abused: Not on file  . Sexually Abused: No     OBSERVATIONS/OBJECTIVE:  BP 100/68 (BP Location: Right Arm, Patient Position: Sitting)   Pulse 97   Temp 97.8 F (36.6 C) (Temporal)   Resp 18    Ht 5' 10"  (1.778 m)   Wt 240 lb 3.2 oz (109 kg)   SpO2 99%   BMI 34.47 kg/m  GENERAL: Patient is a well appearing female in no acute distress HEENT:  Sclerae anicteric.  Mask in place. Neck is supple.  NODES:  No cervical, supraclavicular, or axillary lymphadenopathy palpated.  BREAST EXAM:  Left breast s/p mastectomy and radiation, no sign of local recurrence, right breast benign LUNGS:  Clear to auscultation bilaterally.  No wheezes or rhonchi. HEART:  Regular rate and rhythm. No murmur appreciated. ABDOMEN:  Soft, nontender.  Positive, normoactive bowel sounds. No organomegaly palpated. MSK:  No focal spinal tenderness to palpation. Full range of motion bilaterally in the upper extremities. EXTREMITIES:  No peripheral edema.   SKIN:  Clear with no obvious rashes or skin changes. No nail dyscrasia. NEURO:  Nonfocal. Well oriented.  Appropriate affect.   LABORATORY DATA:  None for this visit.  DIAGNOSTIC IMAGING:  None for this visit.      ASSESSMENT AND PLAN:  Paula Berry is a pleasant 49 y.o. 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  She presents to the Survivorship Clinic for our initial meeting and routine follow-up post-completion of treatment for breast cancer.    1. Stage IIIB left breast cancer:  Paula Berry is continuing to recover from definitive treatment for breast cancer. She will follow-up with her medical oncologist, Dr. Jana Hakim in 08/2019 with history and physical exam per surveillance protocol.  Her mammogram is due 03/2020.  Her breast density is category C. Today, a comprehensive survivorship care plan and treatment summary was reviewed with the patient today detailing her breast cancer diagnosis, treatment course, potential late/long-term effects of treatment, appropriate follow-up care with recommendations for the  future, and patient education resources.  A copy of this summary, along with a letter will be sent to the patient's primary care provider via mail/fax/In Basket message after today's visit.    2. Abnormal chest CT: Repeating per radiology recommendations 08/31/2019.  3. Bone health:   She was given education on specific activities to promote bone health.  4. Cancer screening:  Due to Paula Berry's history and her age, she should receive screening for skin cancers, colon cancer, and gynecologic cancers.  The information and recommendations are listed on the patient's comprehensive care plan/treatment summary and were reviewed in detail with the patient.    5. Health maintenance and wellness promotion: Paula Berry was encouraged to consume 5-7 servings of fruits and vegetables per day. We reviewed the "Nutrition Rainbow" handout, as well as the handout "Take Control of Your Health and Reduce Your Cancer Risk" from the [place].  She was also encouraged to engage in moderate to vigorous exercise for 30 minutes per day most days of the week. We discussed the [employer] fitness program, which is designed for cancer survivors to help them become more physically fit after cancer treatments.  She was instructed to limit her alcohol consumption and continue to abstain from tobacco use.  6.  Support services/counseling: It is not uncommon for this period of the patient's cancer care trajectory to be one of many emotions and stressors.  We discussed how this can be increasingly difficult during the times of quarantine and social distancing due to the COVID-19 pandemic.   She was given information regarding our available services and encouraged to contact me with any questions or for help enrolling in any of our support group/programs.    Follow up instructions:    -Return to cancer center 08/2019 for f/u with Dr. Jana Hakim -Mammogram due in 03/2020 -CT chest in 08/2019 -Follow up with  Dr. Donne Berry 12/2019 -Follow up with me in 05/2020 -She is welcome to return back to the Survivorship Clinic at any time; no additional follow-up needed at this time.  -Consider referral back to survivorship as a long-term survivor for continued surveillance  The patient was provided an opportunity to ask questions and all were answered. The patient agreed with the plan and demonstrated an understanding of the instructions.   Total encounter time: 45 minutes*  Wilber Bihari, NP 07/25/19 5:04 PM Medical Oncology and Hematology [facility] [place], [place] [address] Tel. [phone]    Fax. [phone]  *Total Encounter Time as defined by the Centers for Medicare and Medicaid Services includes, in addition to the face-to-face time of a patient visit (documented in the note above) non-face-to-face time: obtaining and reviewing outside history, ordering and reviewing medications, tests or procedures, care coordination (communications with other health care professionals or caregivers) and documentation in the medical record.

## 2019-07-27 ENCOUNTER — Telehealth: Payer: Self-pay | Admitting: Adult Health

## 2019-07-27 NOTE — Telephone Encounter (Signed)
I could not reach patient regarding schedule mailbox was full 

## 2019-08-04 ENCOUNTER — Encounter: Payer: BC Managed Care – PPO | Admitting: Rehabilitation

## 2019-09-26 ENCOUNTER — Other Ambulatory Visit: Payer: Self-pay

## 2019-09-26 ENCOUNTER — Encounter (HOSPITAL_COMMUNITY): Payer: Self-pay

## 2019-09-26 ENCOUNTER — Telehealth: Payer: Self-pay | Admitting: *Deleted

## 2019-09-26 ENCOUNTER — Ambulatory Visit (HOSPITAL_COMMUNITY)
Admission: RE | Admit: 2019-09-26 | Discharge: 2019-09-26 | Disposition: A | Discharge status: Home / Self Care | Payer: BC Managed Care – PPO | Source: Ambulatory Visit | Attending: Adult Health | Admitting: Adult Health

## 2019-09-26 DIAGNOSIS — Z171 Estrogen receptor negative status [ER-]: Secondary | ICD-10-CM | POA: Diagnosis present

## 2019-09-26 DIAGNOSIS — C50412 Malignant neoplasm of upper-outer quadrant of left female breast: Secondary | ICD-10-CM | POA: Diagnosis not present

## 2019-09-26 MED ORDER — IOHEXOL 300 MG/ML  SOLN
75.0000 mL | Freq: Once | INTRAMUSCULAR | Status: AC | PRN
  Administered 2019-09-26: 09:00:00 75 mL via INTRAVENOUS

## 2019-09-26 MED ORDER — SODIUM CHLORIDE (PF) 0.9 % IJ SOLN
INTRAMUSCULAR | Status: AC
  Filled 2019-09-26: qty 50

## 2019-09-26 NOTE — Telephone Encounter (Signed)
LM with note below 

## 2019-09-26 NOTE — Telephone Encounter (Signed)
-----   Message from Gardenia Phlegm, NP sent at 09/26/2019  1:12 PM EDT ----- Please let patient know that her CT chest looks good.  She has expected radiation changes in her lung, but no cancer!  Thanks, Mendel Ryder ----- Message ----- From: Buel Ream, Rad Results In Sent: 09/26/2019  12:27 PM EDT To: Gardenia Phlegm, NP

## 2019-09-28 ENCOUNTER — Inpatient Hospital Stay: Payer: BC Managed Care – PPO | Admitting: Oncology

## 2019-09-28 ENCOUNTER — Inpatient Hospital Stay: Payer: BC Managed Care – PPO

## 2019-10-04 ENCOUNTER — Inpatient Hospital Stay: Payer: BC Managed Care – PPO | Attending: Adult Health

## 2019-10-04 ENCOUNTER — Other Ambulatory Visit: Payer: Self-pay

## 2019-10-04 ENCOUNTER — Inpatient Hospital Stay: Payer: BC Managed Care – PPO | Admitting: Oncology

## 2019-10-04 VITALS — BP 113/74 | HR 90 | Temp 98.2°F | Resp 18 | Ht 70.0 in | Wt 248.5 lb

## 2019-10-04 DIAGNOSIS — J45909 Unspecified asthma, uncomplicated: Secondary | ICD-10-CM | POA: Diagnosis not present

## 2019-10-04 DIAGNOSIS — Z79899 Other long term (current) drug therapy: Secondary | ICD-10-CM | POA: Insufficient documentation

## 2019-10-04 DIAGNOSIS — Z801 Family history of malignant neoplasm of trachea, bronchus and lung: Secondary | ICD-10-CM | POA: Insufficient documentation

## 2019-10-04 DIAGNOSIS — Z9221 Personal history of antineoplastic chemotherapy: Secondary | ICD-10-CM | POA: Insufficient documentation

## 2019-10-04 DIAGNOSIS — Z171 Estrogen receptor negative status [ER-]: Secondary | ICD-10-CM | POA: Insufficient documentation

## 2019-10-04 DIAGNOSIS — Z923 Personal history of irradiation: Secondary | ICD-10-CM | POA: Insufficient documentation

## 2019-10-04 DIAGNOSIS — Z9012 Acquired absence of left breast and nipple: Secondary | ICD-10-CM | POA: Diagnosis not present

## 2019-10-04 DIAGNOSIS — C50412 Malignant neoplasm of upper-outer quadrant of left female breast: Secondary | ICD-10-CM | POA: Diagnosis not present

## 2019-10-04 DIAGNOSIS — Z853 Personal history of malignant neoplasm of breast: Secondary | ICD-10-CM | POA: Insufficient documentation

## 2019-10-04 DIAGNOSIS — Z833 Family history of diabetes mellitus: Secondary | ICD-10-CM | POA: Insufficient documentation

## 2019-10-04 DIAGNOSIS — Z808 Family history of malignant neoplasm of other organs or systems: Secondary | ICD-10-CM | POA: Diagnosis not present

## 2019-10-04 DIAGNOSIS — Z7951 Long term (current) use of inhaled steroids: Secondary | ICD-10-CM | POA: Diagnosis not present

## 2019-10-04 DIAGNOSIS — Z90710 Acquired absence of both cervix and uterus: Secondary | ICD-10-CM | POA: Insufficient documentation

## 2019-10-04 DIAGNOSIS — Z9079 Acquired absence of other genital organ(s): Secondary | ICD-10-CM | POA: Insufficient documentation

## 2019-10-04 DIAGNOSIS — Z8249 Family history of ischemic heart disease and other diseases of the circulatory system: Secondary | ICD-10-CM | POA: Diagnosis not present

## 2019-10-04 LAB — COMPREHENSIVE METABOLIC PANEL
ALT: 9 U/L (ref 0–44)
AST: 13 U/L — ABNORMAL LOW (ref 15–41)
Albumin: 3.9 g/dL (ref 3.5–5.0)
Alkaline Phosphatase: 93 U/L (ref 38–126)
Anion gap: 11 (ref 5–15)
BUN: 11 mg/dL (ref 6–20)
CO2: 26 mmol/L (ref 22–32)
Calcium: 9.2 mg/dL (ref 8.9–10.3)
Chloride: 105 mmol/L (ref 98–111)
Creatinine, Ser: 0.78 mg/dL (ref 0.44–1.00)
GFR calc Af Amer: >60 mL/min (ref >60)
GFR calc non Af Amer: >60 mL/min (ref >60)
Glucose, Bld: 91 mg/dL (ref 70–99)
Potassium: 4.2 mmol/L (ref 3.5–5.1)
Sodium: 142 mmol/L (ref 135–145)
Total Bilirubin: 0.9 mg/dL (ref 0.3–1.2)
Total Protein: 7.3 g/dL (ref 6.5–8.1)

## 2019-10-04 LAB — CBC WITH DIFFERENTIAL/PLATELET
Abs Immature Granulocytes: 0.01 10*3/uL (ref 0.00–0.07)
Basophils Absolute: 0 10*3/uL (ref 0.0–0.1)
Basophils Relative: 0 %
Eosinophils Absolute: 0.1 10*3/uL (ref 0.0–0.5)
Eosinophils Relative: 1 %
HCT: 44.3 % (ref 36.0–46.0)
Hemoglobin: 14.4 g/dL (ref 12.0–15.0)
Immature Granulocytes: 0 %
Lymphocytes Relative: 35 %
Lymphs Abs: 2.2 10*3/uL (ref 0.7–4.0)
MCH: 31 pg (ref 26.0–34.0)
MCHC: 32.5 g/dL (ref 30.0–36.0)
MCV: 95.3 fL (ref 80.0–100.0)
Monocytes Absolute: 0.7 10*3/uL (ref 0.1–1.0)
Monocytes Relative: 11 %
Neutro Abs: 3.3 10*3/uL (ref 1.7–7.7)
Neutrophils Relative %: 53 %
Platelets: 218 10*3/uL (ref 150–400)
RBC: 4.65 MIL/uL (ref 3.87–5.11)
RDW: 12.2 % (ref 11.5–15.5)
WBC: 6.3 10*3/uL (ref 4.0–10.5)
nRBC: 0 % (ref 0.0–0.2)

## 2019-10-04 NOTE — Progress Notes (Signed)
[place]  Telephone:([phone]) [phone] Fax:(336) [phone]    ID: Paula Berry DOB: [birth year]  MR#: [id number]  JYN#:[id number]  Patient Care Team: Vernie Shanks, MD as PCP - General (Family Medicine) Rolm Bookbinder, MD as Consulting Physician (General Surgery) Dejion Grillo, Virgie Dad, MD as Consulting Physician (Oncology) Eppie Gibson, MD as Attending Physician (Radiation Oncology) Everlene Farrier, MD as Consulting Physician (Obstetrics and Gynecology) Everlean Alstrom, MD as Attending Physician (Radiology) OTHER MD:   CHIEF COMPLAINT: Triple negative breast cancer (s/p left mastectomy)  CURRENT TREATMENT: Observation   INTERVAL HISTORY: Paula Berry returns today for follow-up of her triple negative breast cancer. She is now under observation.  Since her last visit, she underwent chest CT on 09/26/2019, which showed: no evidence of metastatic disease in the chest; expected interval development of radiation fibrosis.   REVIEW OF SYSTEMS: Paula Berry and her husband both have had the 2 doses of the [place] vaccine.  They tolerated it well.  They are just beginning to open up their social life slightly.  Paula Berry is having some hot flashes.  She has discomfort in the left shoulder left axilla left chest wall area.  She is doing some stretching exercises which helps.  She walks about a mile about 3 times a week she says.  She also does a little bit of yoga.   HISTORY OF CURRENT ILLNESS: From the original intake note:  The patient had a left mammogram with tomography and ultrasonography 02/10/Berry at the [facility] after screening recall for a possible left breast mass.  This found the breast density to be category C.  There was a small lobulated mass in the lower outer aspect of the left breast, which was not palpable.  Ultrasound showed a small lobulated cyst in that area measuring 0.6 cm.  This was felt to be benign.  In July 2019 she had screening  mammography at Dr. Clementeen Hoof office.  I do not have that report but according to the patient it was unremarkable.  In October 2019 however the patient developed left axillary swelling and tenderness and was again set up for left mammography with tomography and left breast ultrasonography at the [facility], 03/11/2018.  Breast density was category C.  Mammography showed numerous markedly enlarged left axillary lymph nodes.  There was periareolar skin thickening.  There was an area of ill-defined distortion in the upper outer left breast and on physical exam there was a firm lump superior and lateral to the left nipple, with swelling of the left axilla.  Ultrasound of the left breast and both upper quadrants found an ill-defined irregular hypoechoic area measuring at least 4.2 cm.  There also multiple scattered cysts.  Ultrasound of the left axilla showed at least 6 abnormal lymph nodes.  On 03/16/2018 the patient underwent right mammography with tomography and right breast ultrasonography at the [facility].  Again breast density was category C.  There were multiple circumscribed equal density masses in the upper outer quadrant of the right breast which appeared stable.  Ultrasound showed diffuse fibrocystic changes.  An additional hypoechoic mass was noted at the 9 o'clock position 3 cm from the nipple measuring 0.8 cm, with no associated vascularity.  A complex solid and cystic mass was noted superficially at the 6 o'clock position 3 cm from the nipple measuring 1.6 cm.  Evaluation of the right axilla was negative.   On 03/18/2018 the patient underwent biopsy of the 2 suspicious areas in the right breast, and they both  showed only fibrocystic changes with some chronic inflammation (SAA [phone]).  Biopsy of the left breast at 1:00 and 1 of the left axillary lymph nodes on 03/16/2018 found an invasive ductal carcinoma, E-cadherin positive, grade 3, estrogen and progesterone receptor negative, HER-2  negative by immunohistochemistry (1+), with an MIB-1 of 15%.  The patient's subsequent history is as detailed below.   PAST MEDICAL HISTORY: Past Medical History:  Diagnosis Date  . Anemia    had iron infusion 08/12/15  . Asthma   . Breast cancer ([facility]) 03/2018   Left Breast Cancer  . Cancer [facility])    Breast cancer   . Genital herpes   . GERD (gastroesophageal reflux disease)   . Headache    migraines 2-3 times monthly  . Pneumonia    hospitalizied for pneumonia as a child    PAST SURGICAL HISTORY: Past Surgical History:  Procedure Laterality Date  . ABDOMINAL HYSTERECTOMY Bilateral 3/23/Berry   Procedure: HYSTERECTOMY ABDOMINAL, BILATERAL SALPINGECTOMY;  Surgeon: Everlene Farrier, MD;  Location: [facility];  Service: Gynecology;  Laterality: Bilateral;  . MASTECTOMY Left 07/2018  . MASTECTOMY WITH AXILLARY LYMPH NODE DISSECTION Left 08/30/2018   Procedure: Left mastectomy with axillary lymph node dissection;  Surgeon: Rolm Bookbinder, MD;  Location: [facility];  Service: General;  Laterality: Left;  . MYOMECTOMY ABDOMINAL APPROACH    . PORTACATH PLACEMENT N/A 03/30/2018   Procedure: INSERTION PORT-A-CATH WITH [place];  Surgeon: Rolm Bookbinder, MD;  Location: [facility];  Service: General;  Laterality: N/A;    FAMILY HISTORY: Family History  Problem Relation Age of Onset  . Hypertension Other   . Diabetes Cousin        mat first cousin  . Cancer Other   . Dementia Maternal Grandfather   . Lung cancer Paternal Grandmother        d. 81-60  . Brain cancer Paternal Aunt        d. 33, father's mat 1/2 sister  . Diabetes Maternal Grandmother   . Other Paternal Grandfather        d. WWII  . Aneurysm Maternal Uncle        brain  . Migraines Neg Hx    The patient's parents are in their early 62s as of October 2019.  A paternal aunt had brain cancer at age 27.  A paternal grandmother had lung cancer at age 53.  There is no breast or ovarian cancer history in the family.   However note that the patient has little information on her mother side of the family.   GYNECOLOGIC HISTORY:  No LMP recorded. Patient has had a hysterectomy. 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  Her husband [name]. works for the [employer].  He is a former patient here, with stage IV colon cancer.  They live alone, with no pets.     ADVANCED DIRECTIVES: Not in place   HEALTH MAINTENANCE: Social History   Tobacco Use  . Smoking status: Never Smoker  . Smokeless tobacco: Never Used  Substance Use Topics  . Alcohol use: Yes    Alcohol/week: 1.0 standard drinks    Types: 1 Glasses of wine per week    Comment: daily  . Drug use:  No     Colonoscopy: Never  PAP: Status post hysterectomy  Bone density: Never   Allergies  Allergen Reactions  . Neosporin [Neomycin-Bacitracin Zn-Polymyx] Other (See Comments)    Decreased wound healing with blisters    Current Outpatient Medications  Medication Sig Dispense Refill  . acetaminophen (TYLENOL) 500 MG tablet Take 1,000 mg by mouth every 8 (eight) hours as needed for moderate pain.    Marland Kitchen acyclovir ointment (ZOVIRAX) 5 % Apply 1 application topically 4 (four) times daily as needed (outbreaks).   2  . albuterol (PROVENTIL HFA;VENTOLIN HFA) 108 (90 Base) MCG/ACT inhaler Inhale 2 puffs into the lungs every 6 (six) hours as needed for wheezing or shortness of breath.     . gabapentin (NEURONTIN) 300 MG capsule Take 1 capsule (300 mg total) by mouth at bedtime. 90 capsule 4  . methocarbamol (ROBAXIN) 500 MG tablet Take 1 tablet (500 mg total) by mouth 3 (three) times daily. 20 tablet 1  . montelukast (SINGULAIR) 10 MG tablet Take 10 mg by mouth every evening.      . mupirocin ointment (BACTROBAN) 2 % Apply to peeling skin BID-TID PRN 22 g 0  . oxyCODONE (OXY IR/ROXICODONE) 5 MG immediate release tablet Take 1 tablet (5 mg total) by mouth every 4 (four) hours as needed for moderate pain. 12 tablet 0  . pantoprazole (PROTONIX) 40 MG tablet Take 40 mg by mouth every evening.    . SYMBICORT 160-4.5 MCG/ACT inhaler INL 2 PFS PO BID  3  . valACYclovir (VALTREX) 500 MG tablet Take 500 mg by mouth See admin instructions. Take 500 mg twice daily for 3 days at onset of outbreak then take 500 mg once daily until clear     No current facility-administered medications for this visit.    OBJECTIVE: African-American woman who appears younger than stated age 61:   10/04/19 1526  BP: 113/74  Pulse: 90  Resp: 18  Temp: 98.2 F (36.8 C)  SpO2: 98%     Body mass index is 35.66 kg/m.   Wt Readings from Last 3 Encounters:  10/04/19 248 lb 8 oz (112.7 kg)  07/25/19 240 lb 3.2 oz (109 kg)  03/23/19 230 lb 12.8 oz (104.7 kg)  ECOG FS: 1 - Symptomatic but completely ambulatory  Sclerae unicteric, EOMs intact Wearing a mask No cervical or supraclavicular adenopathy Lungs no rales or rhonchi Heart regular rate and rhythm Abd soft, nontender, positive bowel sounds MSK no focal spinal tenderness, no upper extremity lymphedema Neuro: nonfocal, well oriented, appropriate affect Breasts: The right breast is unremarkable.  Left breast is status post mastectomy and radiation.  There is no evidence of local recurrence.  Both axillae are benign.   LAB RESULTS:  CMP     Component Value Date/Time   NA 141 10/07/2018 1007   K 4.3 10/07/2018 1007   CL 104 10/07/2018 1007   CO2 29 10/07/2018 1007   GLUCOSE 90 10/07/2018 1007   BUN 11 10/07/2018 1007   CREATININE 0.74 10/07/2018 1007   CREATININE 0.67 08/11/2018 0900   CALCIUM 9.7 10/07/2018 1007   PROT 7.3 10/07/2018 1007   ALBUMIN 4.0 10/07/2018 1007   AST 11 (L) 10/07/2018 1007   AST 13 (L) 08/11/2018 0900    ALT 9 10/07/2018 1007   ALT 13 08/11/2018 0900   ALKPHOS 67 10/07/2018 1007   BILITOT 0.8 10/07/2018 1007   BILITOT 0.7 08/11/2018 0900   GFRNONAA >60 10/07/2018 1007   GFRNONAA >60 08/11/2018 0900  GFRAA >60 10/07/2018 1007   GFRAA >60 08/11/2018 0900    No results found for: Ronnald Ramp, A1GS, A2GS, BETS, BETA2SER, GAMS, MSPIKE, SPEI  No results found for: Nils Pyle, [facility]  Lab Results  Component Value Date   WBC 6.3 10/04/2019   NEUTROABS 3.3 10/04/2019   HGB 14.4 10/04/2019   HCT 44.3 10/04/2019   MCV 95.3 10/04/2019   PLT 218 10/04/2019      Chemistry      Component Value Date/Time   NA 141 10/07/2018 1007   K 4.3 10/07/2018 1007   CL 104 10/07/2018 1007   CO2 29 10/07/2018 1007   BUN 11 10/07/2018 1007   CREATININE 0.74 10/07/2018 1007   CREATININE 0.67 08/11/2018 0900      Component Value Date/Time   CALCIUM 9.7 10/07/2018 1007   ALKPHOS 67 10/07/2018 1007   AST 11 (L) 10/07/2018 1007   AST 13 (L) 08/11/2018 0900   ALT 9 10/07/2018 1007   ALT 13 08/11/2018 0900   BILITOT 0.8 10/07/2018 1007   BILITOT 0.7 08/11/2018 0900       No results found for: LABCA2  No components found for: KGYJEH631  No results for input(s): INR in the last 168 hours.  No results found for: LABCA2  No results found for: CAN199  No results found for: SHF026  No results found for: VZC588  Lab Results  Component Value Date   CA2729 213.7 (H) 04/07/2018    No components found for: HGQUANT  No results found for: CEA1 / No results found for: CEA1   No results found for: AFPTUMOR  No results found for: CHROMOGRNA  No results found for: HGBA, HGBA2QUANT, HGBFQUANT, HGBSQUAN (Hemoglobinopathy evaluation)   No results found for: LDH  No results found for: IRON, TIBC, IRONPCTSAT (Iron and TIBC)  No results found for: FERRITIN  Urinalysis No results found for: COLORURINE, APPEARANCEUR, LABSPEC, PHURINE, GLUCOSEU, HGBUR,  BILIRUBINUR, KETONESUR, PROTEINUR, UROBILINOGEN, NITRITE, LEUKOCYTESUR   STUDIES: CT Chest W Contrast  Result Date: 09/26/2019 CLINICAL DATA:  Breast cancer staging, status post left mastectomy, chemotherapy and radiation EXAM: CT CHEST WITH CONTRAST TECHNIQUE: Multidetector CT imaging of the chest was performed during intravenous contrast administration. CONTRAST:  21m OMNIPAQUE IOHEXOL 300 MG/ML  SOLN COMPARISON:  01/20/2019, 04/05/2018 FINDINGS: Cardiovascular: No significant vascular findings. Normal heart size. No pericardial effusion. Mediastinum/Nodes: No enlarged mediastinal, hilar, or axillary lymph nodes. Soft tissue stranding in the anterior mediastinum, which may reflect thymic remnant versus radiation change. Thyroid gland, trachea, and esophagus demonstrate no significant findings. Lungs/Pleura: Interval resolution of previously seen subpleural heterogeneous airspace opacities of the anterior left upper lobe with development of minimal fibrotic interstitial opacity. Unchanged bronchiectasis, scarring, and volume loss of the left lower lobe. No pleural effusion or pneumothorax. Upper Abdomen: No acute abnormality. Musculoskeletal: Status post left mastectomy. Surgical clips in the left axilla. S shaped scoliosis of the thoracolumbar spine. IMPRESSION: 1. Expected interval development of radiation fibrosis of the anterior left upper lobe with resolution of previously seen heterogeneous airspace opacities. 2.  No evidence of metastatic disease in the chest. 3. Underlying post infectious or inflammatory bronchiectasis, scarring, and volume loss of the left lower lobe. 4.  Status post left mastectomy. Electronically Signed   By: AEddie CandleM.D.   On: 09/26/2019 12:25    ELIGIBLE FOR AVAILABLE RESEARCH PROTOCOL:  no   ASSESSMENT: 49y.o. [place] woman status post left breast overlapping site biopsy and left axillary lymph node biopsy 03/16/2018 for  a clinical T2 N3, stage IIIC invasive  ductal carcinoma, triple negative, with an MIB-1 of 15%   (1) Staging studies  (a) CT chest on 04/05/2018: left axillary and chronic left retropectoral adenopathy, supraclavicular adenopathy, left breast skin thickening and mass, 49m left upper lobe pulmonary nodule (repeat ct chest in 3-6 months recommended), bronchiectasis in left lower lobe.  (b) CT neck on 04/05/2018: left supraclavicular adenopathy, ? Abnormality in left frontal lobe of brain  (c) Bone scan on 04/05/2018: no findings suggestive of metastatic disease to bone  (d) MRI brain 04/07/2018: negative  (2) neoadjuvant chemotherapy consisting of doxorubicin and cyclophosphamide in dose dense fashion x4 started 04/07/2018, completed 05/19/2018,  followed by weekly carboplatin and paclitaxel x10 started 06/02/2018, last dose 08/04/2018 (stopped two cycles early due to peripheral neuropathy)  (3) genetics testing 11/18: no deleterious mutations.  Genes tested include: AIP, ALK, APC, ATM, AXIN2,BAP1,  BARD1, BLM, BMPR1A, BRCA1, BRCA2, BRIP1, CASR, CDC73, CDH1, CDK4, CDKN1B, CDKN1C, CDKN2A (p14ARF), CDKN2A (p16INK4a), CEBPA, CHEK2, CTNNA1, DICER1, DIS3L2, EGFR (c.2369C>T, p.Thr790Met variant only), EPCAM (Deletion/duplication testing only), FH, FLCN, GATA2, GPC3, GREM1 (Promoter region deletion/duplication testing only), HOXB13 (c.251G>A, p.Gly84Glu), HRAS, KIT, MAX, MEN1, MET, MITF (c.952G>A, p.Glu318Lys variant only), MLH1, MSH2, MSH3, MSH6, MUTYH, NBN, NF1, NF2, NTHL1, PALB2, PDGFRA, PHOX2B, PMS2, POLD1, POLE, POT1, PRKAR1A, PTCH1, PTEN, RAD50, RAD51C, RAD51D, RB1, RECQL4, RET, RUNX1, SDHAF2, SDHA (sequence changes only), SDHB, SDHC, SDHD, SMAD4, SMARCA4, SMARCB1, SMARCE1, STK11, SUFU, TERC, TERT, TMEM127, TP53, TSC1, TSC2, VHL, WRN and WT1.  (4) left mastectomy and axillary lymph node dissection 08/30/2018 showed a residual ypT1a ypN0 invasive ductal carcinoma, grade 2, with negative margins.  (a) 12 axillary nodes were removed  (b) repeat  prognostic profile again triple negative, with an MIB-1 of 30%  (5) adjuvant radiation completed 11/22/2018  (6) Caris panel 10/31/2018 found insufficient tissue for PDL 1 testing, MSI was stable, mismatch repair indeterminate, no PI K3 mutation, androgen receptor insufficient tumor   PLAN: MEudeliais now a little over a year out from definitive surgery for her breast cancer with no evidence of disease recurrence.  This is favorable.  The chest CT scan is very reassuring as well.  We discussed the left chest wall area which is always going to want to be tight.  She is continuing to do her stretching exercises which is exactly the right thing to do.  She knows she should be doing a little bit more in terms of fitness and is planning to walk a little farther and a little bit more frequently.  We discussed the chronic changes that occur with the trauma she has received from surgery and radiation as well as the development of life-changing disease.   I do not see how she could be doing better than she is.  She is going to see me again in 6 months and then again 6 months after that.  From that point we will start seeing her on a yearly basis  She knows to call for any other issues that may develop before then  Total encounter time 30 minutes.*  Crescencio Jozwiak, GVirgie Dad MD  10/04/19 3:37 PM Medical On[facility] [place] [address]Tel. [phone]   Fax. [phone]  I, KWilburn Mylar am acting as scribe for Dr. GVirgie Dad Amie Cowens.  I, GLurline DelMD, have reviewed the above documentation for accuracy and completeness, and I agree with the above.   *Total Encounter Time as defined  by the Centers for Medicare and Medicaid Services includes, in addition to the face-to-face time of a patient visit (documented in the note above) non-face-to-face time: obtaining and reviewing outside history, ordering and reviewing  medications, tests or procedures, care coordination (communications with other health care professionals or caregivers) and documentation in the medical record.

## 2019-10-05 ENCOUNTER — Telehealth: Payer: Self-pay | Admitting: Oncology

## 2019-10-05 NOTE — Telephone Encounter (Signed)
Scheduled appts per 5/5 los. Left voicemail with appt date and time.

## 2019-12-21 ENCOUNTER — Telehealth: Payer: Self-pay | Admitting: Adult Health

## 2019-12-21 NOTE — Telephone Encounter (Signed)
Rescheduled appointments per 7/22 provider message. Left message on patient voicemail with updated appointment date and time.  

## 2020-02-04 IMAGING — CT CT CHEST WITH CONTRAST
2 of 3 series · 15 of 36 positions shown, 18 images · IV contrast (omnipaque)
Comparison: Chest CT 04/05/2018

CLINICAL DATA: History of breast cancer. Follow-up exam status post
left mastectomy.

EXAM:
CT CHEST WITH CONTRAST
TECHNIQUE: Multidetector CT imaging of the chest was performed during
intravenous contrast administration.
CONTRAST:  75mL OMNIPAQUE IOHEXOL 300 MG/ML  SOLN

[Series 3: axial st · axial · 0.61mm/px · z∈[-371,-121]mm · 12 of 147 slices shown, 15 images]
[im 11/147  mediastinal]
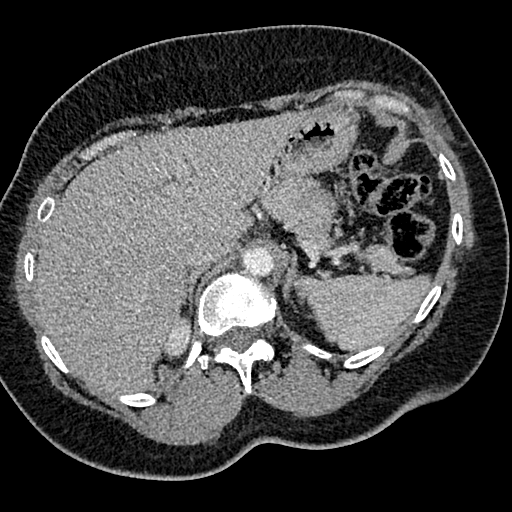
[im 11/147  lung]
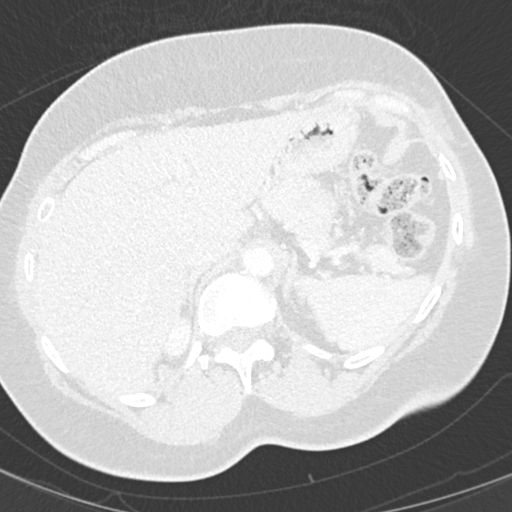
[im 22/147  lung]
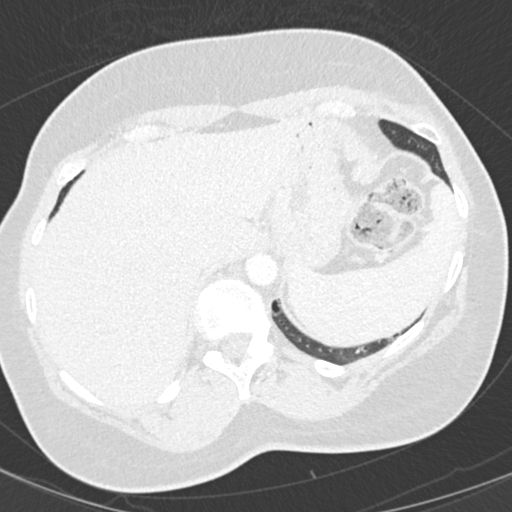
[im 33/147  lung]
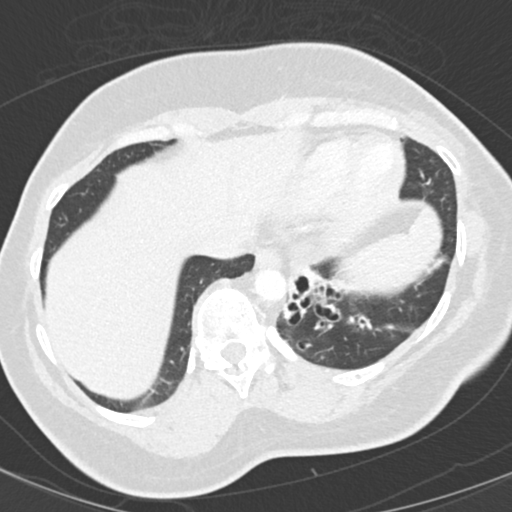
[im 44/147  lung]
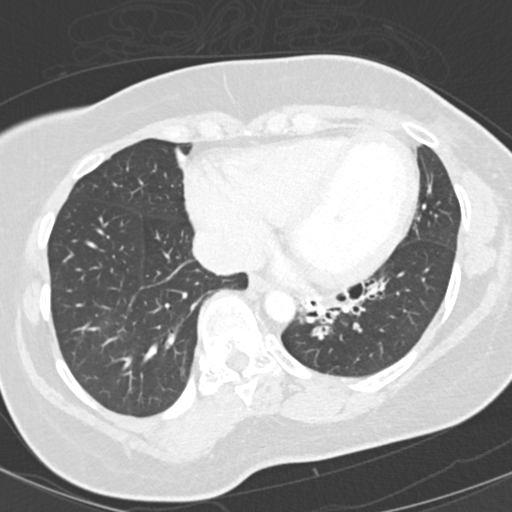
[im 55/147  mediastinal]
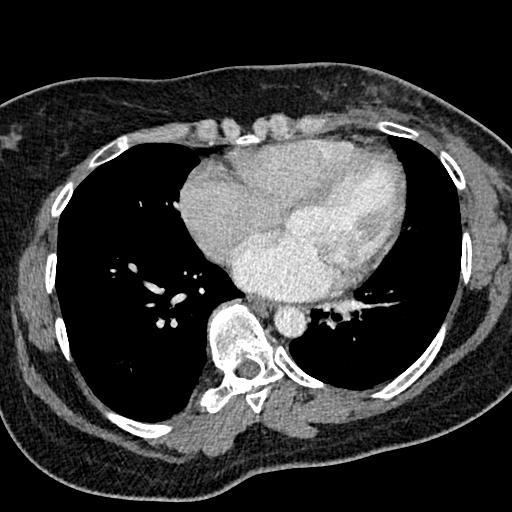
[im 55/147  lung]
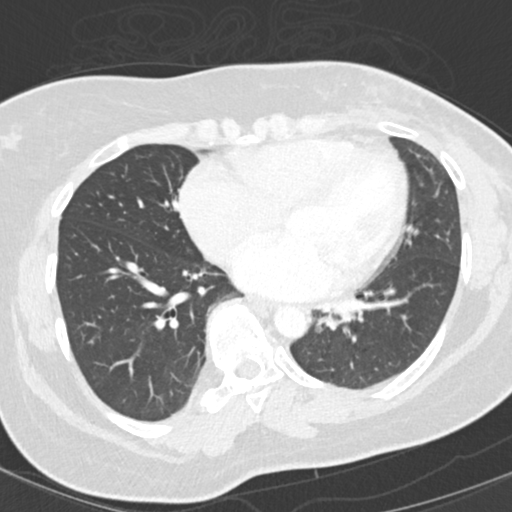
[im 65/147  lung]
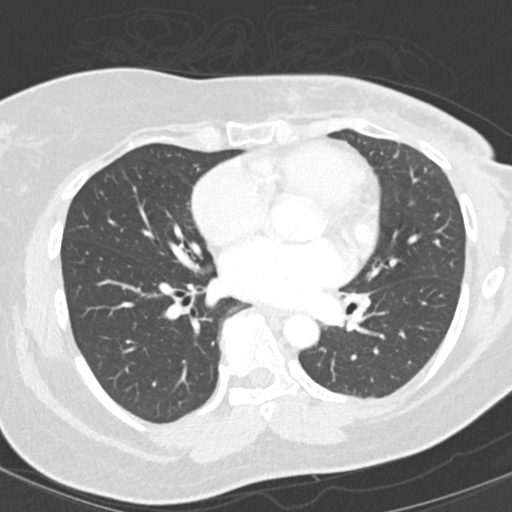
[im 82/147  lung]
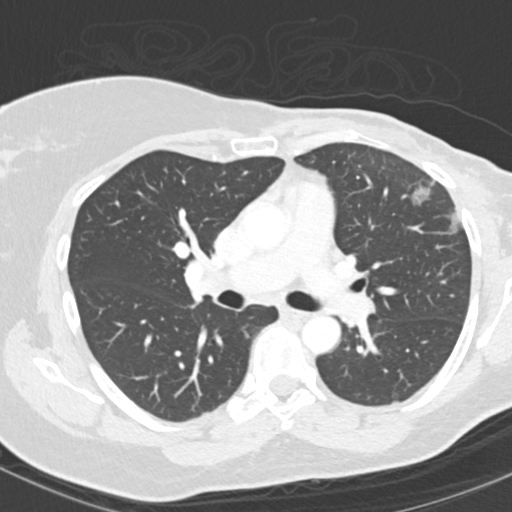
[im 92/147  lung]
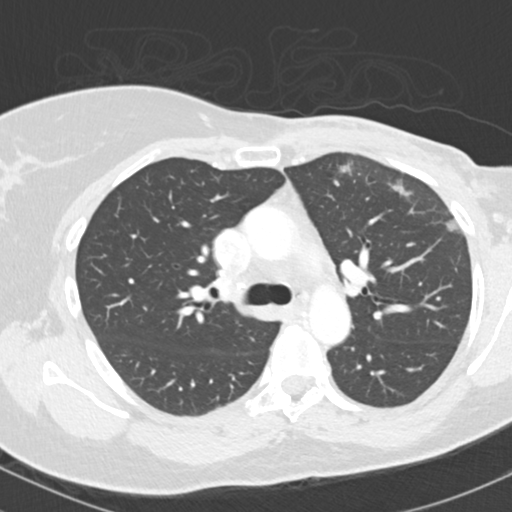
[im 103/147  mediastinal]
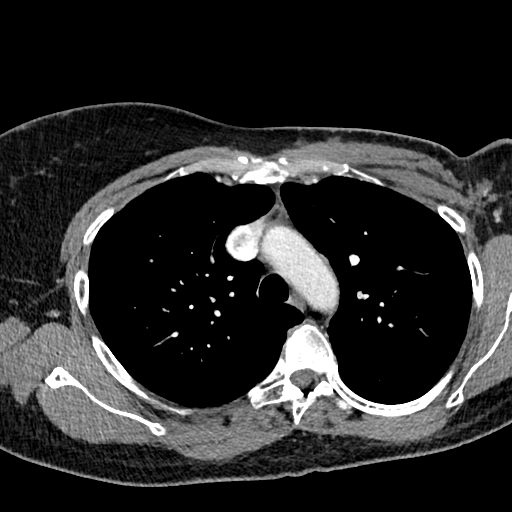
[im 103/147  lung]
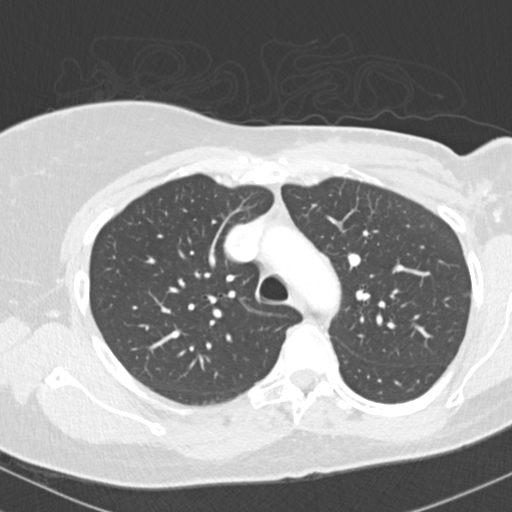
[im 114/147  lung]
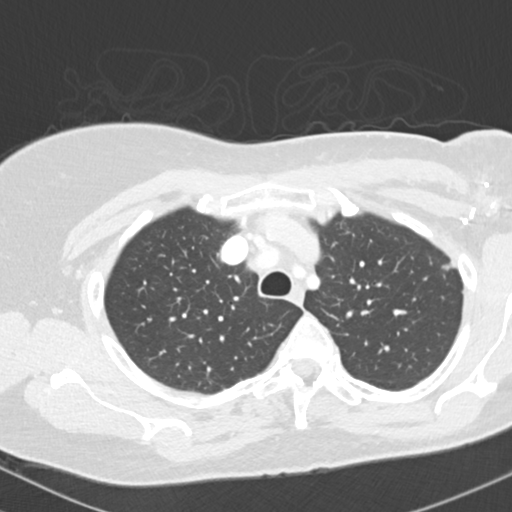
[im 125/147  lung]
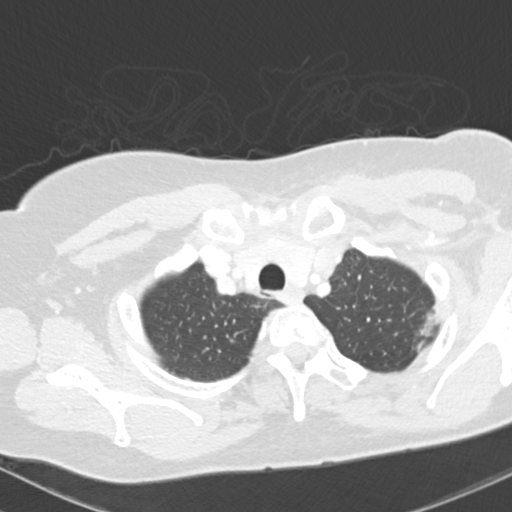
[im 136/147  lung]
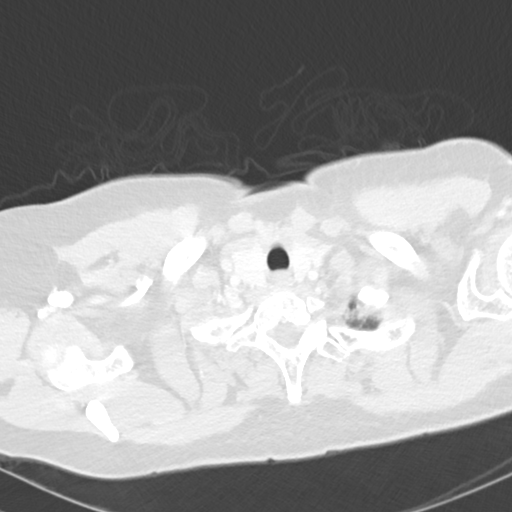

[Series 6: coronal · coronal · 0.56mm/px · 3 of 103 slices shown]
[im 21/103  lung]
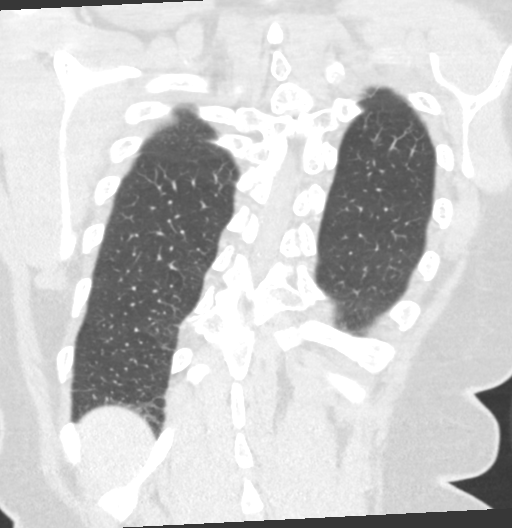
[im 41/103  lung]
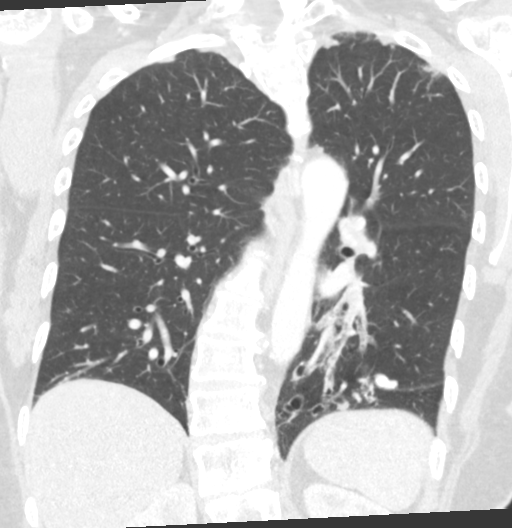
[im 62/103  lung]
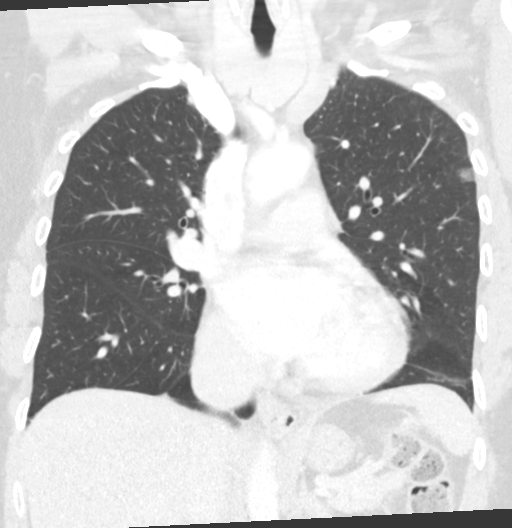

[15 of 36 positions shown; findings below may reference images not displayed]

FINDINGS: Cardiovascular: Heart size upper limits of normal. Trace fluid
superior pericardial recess. Thoracic aortic vascular
calcifications.

Mediastinum/Nodes: Postsurgical changes left axilla. No right
axillary lymphadenopathy. No mediastinal or hilar lymphadenopathy.
There is an 8 mm left inferior hilar node (image 78; series 3).
Small hiatal hernia.

Lungs/Pleura: Central airways are patent. Redemonstrated left lower
lobe bronchiectasis with areas of scattered mucoid impaction.
Sharply marginated ground-glass opacities within the peripheral left
upper lobe, new from prior (image 59; series 5). Similar-appearing 4
mm subpleural left upper lobe nodule (image 44; series 5). No
pleural effusion or pneumothorax.

Upper Abdomen: No acute process.

Musculoskeletal: Thoracic spine degenerative changes. No aggressive
or acute appearing osseous lesions.
IMPRESSION: Interval left mastectomy and left axillary lymph node resection.

Interval development of sharply marginated predominately
ground-glass opacities within the peripheral left upper lobe which
may represent postradiation changes.

Stable 4 mm subpleural left upper lobe nodule.

Recommend attention to the pulmonary findings on short-term
follow-up chest CT.

## 2020-03-26 ENCOUNTER — Other Ambulatory Visit: Payer: Self-pay | Admitting: Oncology

## 2020-03-26 ENCOUNTER — Telehealth: Payer: Self-pay

## 2020-03-26 DIAGNOSIS — Z1231 Encounter for screening mammogram for malignant neoplasm of breast: Secondary | ICD-10-CM

## 2020-03-26 NOTE — Telephone Encounter (Signed)
Pt called & LVM c/o (L) chest discomfort; breast and asks if she has a MM scheduled anytime soon. This LPN attempted to return call, no answer, LVM for pt to return call.

## 2020-03-27 ENCOUNTER — Telehealth: Payer: Self-pay | Admitting: *Deleted

## 2020-03-27 NOTE — Telephone Encounter (Signed)
Dr Jana Hakim would like patient to see Sandi Mealy to assess the area.  Pt will come Thursday at 2:00

## 2020-03-27 NOTE — Telephone Encounter (Signed)
Pt reports increased pain in her left chest. "there is a difference in the tightness, doesn't feel normal- more tight. It hurts to cough or laugh".  Also wants to know if we can get her into GI sooner than 12/1 for mammogram.

## 2020-03-28 ENCOUNTER — Telehealth: Payer: Self-pay | Admitting: Emergency Medicine

## 2020-03-28 ENCOUNTER — Inpatient Hospital Stay: Payer: BC Managed Care – PPO | Admitting: Medical

## 2020-03-28 NOTE — Telephone Encounter (Signed)
Called pt about missed appt today for [facility] regarding recent increase in chest pain.  Pt thought appt was tomorrow at 2 pm instead of today.  Moved appt to 10/29 at 2 pm per pt request.

## 2020-03-29 ENCOUNTER — Telehealth: Payer: Self-pay | Admitting: Emergency Medicine

## 2020-03-29 ENCOUNTER — Inpatient Hospital Stay: Payer: BC Managed Care – PPO | Admitting: Medical

## 2020-03-29 NOTE — Telephone Encounter (Signed)
Pt cancelling [facility] appt today, states she will f/u with Dr. Jana Hakim at her next office visit regarding her chest pain which has improved with rest/stretching.

## 2020-04-07 NOTE — Progress Notes (Signed)
[place]  [phone] [phone]    ID: Paula Berry DOB: Jul 20, 1970  MR#: [id number]  RFF#:[id number]  Patient Care Team: Vernie Shanks, MD as PCP - General (Family Medicine) Rolm Bookbinder, MD as Consulting Physician (General Surgery) Leo Weyandt, Virgie Dad, MD as Consulting Physician (Oncology) Eppie Gibson, MD as Attending Physician (Radiation Oncology) Everlene Farrier, MD as Consulting Physician (Obstetrics and Gynecology) Everlean Alstrom, MD as Attending Physician (Radiology) OTHER MD:   CHIEF COMPLAINT: Triple negative breast cancer (s/p left mastectomy)  CURRENT TREATMENT: Observation   INTERVAL HISTORY: Paula Berry returns today for follow-up of her triple negative breast cancer. She is now under observation.  Since her last visit, she has not undergone any additional studies. She is scheduled for right screening mammogram on 05/01/2020.  She is also scheduled for mammary reduction on 05/17/2020 under Dr. Iran Planas.  She has decided that she does not want reconstruction on the left side.  She is concerned about the need for multiple surgeries and delays and possible complications.   REVIEW OF SYSTEMS: Paula Berry exercises by walking usually up to an hour at a time usually 2-3 times a week.  She also has an exercise bike although she is not being getting on it recently.  She finds work interesting and not very stressful.  Overall things are "good".  A detailed review of systems was otherwise noncontributory   COVID 19 VACCINATION STATUS: fully vaccinated [employer]) with booster early October 2021  HISTORY OF CURRENT ILLNESS: From the original intake note:  The patient had a left mammogram with tomography and ultrasonography 07/12/2015 at the [facility] after screening recall for a possible left breast mass.  This found the breast density to be category C.  There was a small lobulated mass in the lower outer aspect of the  left breast, which was not palpable.  Ultrasound showed a small lobulated cyst in that area measuring 0.6 cm.  This was felt to be benign.  In July 2019 she had screening mammography at Dr. Clementeen Hoof office.  I do not have that report but according to the patient it was unremarkable.  In October 2019 however the patient developed left axillary swelling and tenderness and was again set up for left mammography with tomography and left breast ultrasonography at the [facility], 03/11/2018.  Breast density was category C.  Mammography showed numerous markedly enlarged left axillary lymph nodes.  There was periareolar skin thickening.  There was an area of ill-defined distortion in the upper outer left breast and on physical exam there was a firm lump superior and lateral to the left nipple, with swelling of the left axilla.  Ultrasound of the left breast and both upper quadrants found an ill-defined irregular hypoechoic area measuring at least 4.2 cm.  There also multiple scattered cysts.  Ultrasound of the left axilla showed at least 6 abnormal lymph nodes.  On 03/16/2018 the patient underwent right mammography with tomography and right breast ultrasonography at the [facility].  Again breast density was category C.  There were multiple circumscribed equal density masses in the upper outer quadrant of the right breast which appeared stable.  Ultrasound showed diffuse fibrocystic changes.  An additional hypoechoic mass was noted at the 9 o'clock position 3 cm from the nipple measuring 0.8 cm, with no associated vascularity.  A complex solid and cystic mass was noted superficially at the 6 o'clock position 3 cm from the nipple measuring 1.6 cm.  Evaluation of the right axilla  was negative.   On 03/18/2018 the patient underwent biopsy of the 2 suspicious areas in the right breast, and they both showed only fibrocystic changes with some chronic inflammation ([id number]).  Biopsy of the left breast at 1:00 and  1 of the left axillary lymph nodes on 03/16/2018 found an invasive ductal carcinoma, E-cadherin positive, grade 3, estrogen and progesterone receptor negative, HER-2 negative by immunohistochemistry (1+), with an MIB-1 of 15%.  The patient's subsequent history is as detailed below.   PAST MEDICAL HISTORY: Past Medical History:  Diagnosis Date  . Anemia    had iron infusion 08/12/15  . Asthma   . Breast cancer ([place]) 03/2018   Left Breast Cancer  . Cancer [facility])    Breast cancer   . Genital herpes   . GERD (gastroesophageal reflux disease)   . Headache    migraines 2-3 times monthly  . Pneumonia    hospitalizied for pneumonia as a child    PAST SURGICAL HISTORY: Past Surgical History:  Procedure Laterality Date  . ABDOMINAL HYSTERECTOMY Bilateral 08/22/2015   Procedure: HYSTERECTOMY ABDOMINAL, BILATERAL SALPINGECTOMY;  Surgeon: Everlene Farrier, MD;  Location: [facility];  Service: Gynecology;  Laterality: Bilateral;  . MASTECTOMY Left 07/2018  . MASTECTOMY WITH AXILLARY LYMPH NODE DISSECTION Left 08/30/2018   Procedure: Left mastectomy with axillary lymph node dissection;  Surgeon: Rolm Bookbinder, MD;  Location: [facility];  Service: General;  Laterality: Left;  . MYOMECTOMY ABDOMINAL APPROACH    . PORTACATH PLACEMENT N/A 03/30/2018   Procedure: INSERTION PORT-A-CATH WITH [place];  Surgeon: Rolm Bookbinder, MD;  Location: [facility];  Service: General;  Laterality: N/A;    FAMILY HISTORY: Family History  Problem Relation Age of Onset  . Hypertension Other   . Diabetes Cousin        mat first cousin  . Cancer Other   . Dementia Maternal Grandfather   . Lung cancer Paternal Grandmother        d. 26-60  . Brain cancer Paternal Aunt        d. 69, father's mat 1/2 sister  . Diabetes Maternal Grandmother   . Other Paternal Grandfather        d. WWII  . Aneurysm Maternal Uncle        brain  . Migraines Neg Hx    The patient's parents are in their early 40s as of  October 2019.  A paternal aunt had brain cancer at age 19.  A paternal grandmother had lung cancer at age 43.  There is no breast or ovarian cancer history in the family.  However note that the patient has little information on her mother side of the family.   GYNECOLOGIC HISTORY:  No LMP recorded. Patient has had a hysterectomy. 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  She is independent and "owns a group".  Her husband Paula Berry. works for the [employer].  He is a former patient here, with stage IV colon cancer.  They live alone, with no pets.     ADVANCED DIRECTIVES: Not in place   HEALTH MAINTENANCE: Social History   Tobacco Use  . Smoking status: Never Smoker  . Smokeless tobacco: Never Used  Vaping Use  . Vaping Use: Never used  Substance Use Topics  .  Alcohol use: Yes    Alcohol/week: 1.0 standard drink    Types: 1 Glasses of wine per week    Comment: daily  . Drug use: No     Colonoscopy: Never  PAP: Status post hysterectomy  Bone density: Never   Allergies  Allergen Reactions  . Neosporin [Neomycin-Bacitracin Zn-Polymyx] Other (See Comments)    Decreased wound healing with blisters    Current Outpatient Medications  Medication Sig Dispense Refill  . acetaminophen (TYLENOL) 500 MG tablet Take 1,000 mg by mouth every 8 (eight) hours as needed for moderate pain.    Marland Kitchen acyclovir ointment (ZOVIRAX) 5 % Apply 1 application topically 4 (four) times daily as needed (outbreaks).   2  . albuterol (PROVENTIL HFA;VENTOLIN HFA) 108 (90 Base) MCG/ACT inhaler Inhale 2 puffs into the lungs every 6 (six) hours as needed for wheezing or shortness of breath.     . gabapentin (NEURONTIN) 300 MG capsule Take 1 capsule (300 mg total) by mouth at  bedtime. 90 capsule 4  . methocarbamol (ROBAXIN) 500 MG tablet Take 1 tablet (500 mg total) by mouth 3 (three) times daily. 20 tablet 1  . montelukast (SINGULAIR) 10 MG tablet Take 10 mg by mouth every evening.    . mupirocin ointment (BACTROBAN) 2 % Apply to peeling skin BID-TID PRN 22 g 0  . oxyCODONE (OXY IR/ROXICODONE) 5 MG immediate release tablet Take 1 tablet (5 mg total) by mouth every 4 (four) hours as needed for moderate pain. 12 tablet 0  . pantoprazole (PROTONIX) 40 MG tablet Take 40 mg by mouth every evening.    . SYMBICORT 160-4.5 MCG/ACT inhaler INL 2 PFS PO BID  3  . valACYclovir (VALTREX) 500 MG tablet Take 500 mg by mouth See admin instructions. Take 500 mg twice daily for 3 days at onset of outbreak then take 500 mg once daily until clear     No current facility-administered medications for this visit.    OBJECTIVE: African-American woman who appears younger than stated age 31:   04/08/20 1133  BP: 105/82  Pulse: 89  Resp: 18  Temp: 97.7 F (36.5 C)  SpO2: 97%     Body mass index is 35.24 kg/m.   Wt Readings from Last 3 Encounters:  04/08/20 245 lb 9.6 oz (111.4 kg)  10/04/19 248 lb 8 oz (112.7 kg)  07/25/19 240 lb 3.2 oz (109 kg)  ECOG FS: 1 - Symptomatic but completely ambulatory  Sclerae unicteric, EOMs intact Wearing a mask No cervical or supraclavicular adenopathy Lungs no rales or rhonchi Heart regular rate and rhythm Abd soft, nontender, positive bowel sounds MSK no focal spinal tenderness, no left upper extremity lymphedema Neuro: nonfocal, well oriented, appropriate affect Breasts: The right breast is benign.  The left breast has undergone mastectomy followed by radiation.  There is no evidence of local recurrence.   LAB RESULTS:  CMP     Component Value Date/Time   NA 142 04/08/2020 1118   K 3.8 04/08/2020 1118   CL 106 04/08/2020 1118   CO2 29 04/08/2020 1118   GLUCOSE 85 04/08/2020 1118   BUN 10 04/08/2020 1118   CREATININE 0.76  04/08/2020 1118   CREATININE 0.67 08/11/2018 0900   CALCIUM 9.3 04/08/2020 1118   PROT 7.1 04/08/2020 1118   ALBUMIN 3.9 04/08/2020 1118   AST 12 (L) 04/08/2020 1118   AST 13 (L) 08/11/2018 0900   ALT 9 04/08/2020 1118   ALT 13 08/11/2018 0900   ALKPHOS 85 04/08/2020  1118   BILITOT 1.3 (H) 04/08/2020 1118   BILITOT 0.7 08/11/2018 0900   GFRNONAA >60 04/08/2020 1118   GFRNONAA >60 08/11/2018 0900   GFRAA >60 10/04/2019 1508   GFRAA >60 08/11/2018 0900    No results found for: TOTALPROTELP, ALBUMINELP, A1GS, A2GS, BETS, BETA2SER, GAMS, MSPIKE, SPEI  No results found for: Paula Berry, [facility]  Lab Results  Component Value Date   WBC 5.5 04/08/2020   NEUTROABS 2.6 04/08/2020   HGB 14.5 04/08/2020   HCT 44.1 04/08/2020   MCV 94.2 04/08/2020   PLT 193 04/08/2020      Chemistry      Component Value Date/Time   NA 142 04/08/2020 1118   K 3.8 04/08/2020 1118   CL 106 04/08/2020 1118   CO2 29 04/08/2020 1118   BUN 10 04/08/2020 1118   CREATININE 0.76 04/08/2020 1118   CREATININE 0.67 08/11/2018 0900      Component Value Date/Time   CALCIUM 9.3 04/08/2020 1118   ALKPHOS 85 04/08/2020 1118   AST 12 (L) 04/08/2020 1118   AST 13 (L) 08/11/2018 0900   ALT 9 04/08/2020 1118   ALT 13 08/11/2018 0900   BILITOT 1.3 (H) 04/08/2020 1118   BILITOT 0.7 08/11/2018 0900       No results found for: LABCA2  No components found for: EZMOQH476  No results for input(s): INR in the last 168 hours.  No results found for: LABCA2  No results found for: LYY503  No results found for: TWS568  No results found for: LEX517  Lab Results  Component Value Date   CA2729 213.7 (H) 04/07/2018    No components found for: HGQUANT  No results found for: CEA1 / No results found for: CEA1   No results found for: AFPTUMOR  No results found for: CHROMOGRNA  No results found for: HGBA, HGBA2QUANT, HGBFQUANT, HGBSQUAN (Hemoglobinopathy evaluation)   No results found  for: LDH  No results found for: IRON, TIBC, IRONPCTSAT (Iron and TIBC)  No results found for: FERRITIN  Urinalysis No results found for: COLORURINE, APPEARANCEUR, LABSPEC, PHURINE, GLUCOSEU, HGBUR, BILIRUBINUR, KETONESUR, PROTEINUR, UROBILINOGEN, NITRITE, LEUKOCYTESUR   STUDIES: No results found.   ELIGIBLE FOR AVAILABLE RESEARCH PROTOCOL:  no   ASSESSMENT: 49 y.o. [place] woman status post left breast overlapping site biopsy and left axillary lymph node biopsy 03/16/2018 for a clinical T2 N3, stage IIIC invasive ductal carcinoma, triple negative, with an MIB-1 of 15%   (1) Staging studies  (a) CT chest on 04/05/2018: left axillary and chronic left retropectoral adenopathy, supraclavicular adenopathy, left breast skin thickening and mass, 31mm left upper lobe pulmonary nodule (repeat ct chest in 3-6 months recommended), bronchiectasis in left lower lobe.  (b) CT neck on 04/05/2018: left supraclavicular adenopathy, ? Abnormality in left frontal lobe of brain  (c) Bone scan on 04/05/2018: no findings suggestive of metastatic disease to bone  (d) MRI brain 04/07/2018: negative  (2) neoadjuvant chemotherapy consisting of doxorubicin and cyclophosphamide in dose dense fashion x4 started 04/07/2018, completed 05/19/2018,  followed by weekly carboplatin and paclitaxel x10 started 06/02/2018, last dose 08/04/2018 (stopped two cycles early due to peripheral neuropathy)  (3) genetics testing 11/18: no deleterious mutations.  Genes tested include: AIP, ALK, APC, ATM, AXIN2,BAP1,  BARD1, BLM, BMPR1A, BRCA1, BRCA2, BRIP1, CASR, CDC73, CDH1, CDK4, CDKN1B, CDKN1C, CDKN2A (p14ARF), CDKN2A (p16INK4a), CEBPA, CHEK2, CTNNA1, DICER1, DIS3L2, EGFR (c.2369C>T, p.Thr790Met variant only), EPCAM (Deletion/duplication testing only), FH, FLCN, GATA2, GPC3, GREM1 (Promoter region deletion/duplication testing only), HOXB13 (c.251G>A,  p.Gly84Glu), HRAS, KIT, MAX, MEN1, MET, MITF (c.952G>A, p.Glu318Lys variant only), MLH1,  MSH2, MSH3, MSH6, MUTYH, NBN, NF1, NF2, NTHL1, PALB2, PDGFRA, PHOX2B, PMS2, POLD1, POLE, POT1, PRKAR1A, PTCH1, PTEN, RAD50, RAD51C, RAD51D, RB1, RECQL4, RET, RUNX1, SDHAF2, SDHA (sequence changes only), SDHB, SDHC, SDHD, SMAD4, SMARCA4, SMARCB1, SMARCE1, STK11, SUFU, TERC, TERT, TMEM127, TP53, TSC1, TSC2, VHL, WRN and WT1.  (4) left mastectomy and axillary lymph node dissection 08/30/2018 showed a residual ypT1a ypN0 invasive ductal carcinoma, grade 2, with negative margins.  (a) 12 axillary nodes were removed  (b) repeat prognostic profile again triple negative, with an MIB-1 of 30%  (5) adjuvant radiation completed 11/22/2018  (6) [facility]is panel 10/31/2018 found insufficient tissue for PDL 1 testing, MSI was stable, mismatch repair indeterminate, no PI K3 mutation, androgen receptor insufficient tumor   PLAN: [place] is now a year and a half out from definitive surgery for her breast cancer with no evidence of disease recurrence.  This is favorable.  We discussed the fact that scars will continue to "close" and that she needs to do routine stretching exercises if she wants to maintain full range of motion of the left upper extremity.  She will see Dr. [name] shortly after her 2-year anniversary which will be March 2022.  I will see her late September 2022 and we will continue to "tack team her" that way until she completes her 5 years of follow-up  I commended her exercise program and suggested she could increase it slightly.  She knows to call for any other issue that may develop before the next visit  Total encounter time 25 minutes.*   Coren Sagan, Virgie Dad, MD  04/08/20 12:05 PM Medical Oncology and Hematology [facility] [place], [place] [address] Tel. [phone]    Fax. [phone]   I, Wilburn Mylar, am acting as scribe for Dr. Virgie Dad. Kavonte Bearse.  I, Lurline Del MD, have reviewed the above documentation for accuracy and completeness,  and I agree with the above.   *Total Encounter Time as defined by the Centers for Medicare and Medicaid Services includes, in addition to the face-to-face time of a patient visit (documented in the note above) non-face-to-face time: obtaining and reviewing outside history, ordering and reviewing medications, tests or procedures, care coordination (communications with other health care professionals or caregivers) and documentation in the medical record.

## 2020-04-08 ENCOUNTER — Inpatient Hospital Stay: Payer: BC Managed Care – PPO

## 2020-04-08 ENCOUNTER — Other Ambulatory Visit: Payer: Self-pay

## 2020-04-08 ENCOUNTER — Inpatient Hospital Stay: Payer: BC Managed Care – PPO | Attending: Oncology | Admitting: Oncology

## 2020-04-08 VITALS — BP 105/82 | HR 89 | Temp 97.7°F | Resp 18 | Ht 70.0 in | Wt 245.6 lb

## 2020-04-08 DIAGNOSIS — D649 Anemia, unspecified: Secondary | ICD-10-CM | POA: Insufficient documentation

## 2020-04-08 DIAGNOSIS — Z923 Personal history of irradiation: Secondary | ICD-10-CM | POA: Insufficient documentation

## 2020-04-08 DIAGNOSIS — Z9012 Acquired absence of left breast and nipple: Secondary | ICD-10-CM | POA: Diagnosis not present

## 2020-04-08 DIAGNOSIS — Z8249 Family history of ischemic heart disease and other diseases of the circulatory system: Secondary | ICD-10-CM | POA: Insufficient documentation

## 2020-04-08 DIAGNOSIS — Z9221 Personal history of antineoplastic chemotherapy: Secondary | ICD-10-CM | POA: Diagnosis not present

## 2020-04-08 DIAGNOSIS — K219 Gastro-esophageal reflux disease without esophagitis: Secondary | ICD-10-CM | POA: Diagnosis not present

## 2020-04-08 DIAGNOSIS — Z171 Estrogen receptor negative status [ER-]: Secondary | ICD-10-CM | POA: Insufficient documentation

## 2020-04-08 DIAGNOSIS — Z853 Personal history of malignant neoplasm of breast: Secondary | ICD-10-CM | POA: Insufficient documentation

## 2020-04-08 DIAGNOSIS — C50412 Malignant neoplasm of upper-outer quadrant of left female breast: Secondary | ICD-10-CM | POA: Diagnosis not present

## 2020-04-08 DIAGNOSIS — Z79899 Other long term (current) drug therapy: Secondary | ICD-10-CM | POA: Diagnosis not present

## 2020-04-08 DIAGNOSIS — Z90710 Acquired absence of both cervix and uterus: Secondary | ICD-10-CM | POA: Diagnosis not present

## 2020-04-08 DIAGNOSIS — Z801 Family history of malignant neoplasm of trachea, bronchus and lung: Secondary | ICD-10-CM | POA: Diagnosis not present

## 2020-04-08 DIAGNOSIS — Z90722 Acquired absence of ovaries, bilateral: Secondary | ICD-10-CM | POA: Diagnosis not present

## 2020-04-08 DIAGNOSIS — J45909 Unspecified asthma, uncomplicated: Secondary | ICD-10-CM | POA: Diagnosis not present

## 2020-04-08 DIAGNOSIS — Z808 Family history of malignant neoplasm of other organs or systems: Secondary | ICD-10-CM | POA: Insufficient documentation

## 2020-04-08 DIAGNOSIS — Z7951 Long term (current) use of inhaled steroids: Secondary | ICD-10-CM | POA: Diagnosis not present

## 2020-04-08 DIAGNOSIS — Z9079 Acquired absence of other genital organ(s): Secondary | ICD-10-CM | POA: Diagnosis not present

## 2020-04-08 DIAGNOSIS — G629 Polyneuropathy, unspecified: Secondary | ICD-10-CM | POA: Diagnosis not present

## 2020-04-08 DIAGNOSIS — Z833 Family history of diabetes mellitus: Secondary | ICD-10-CM | POA: Insufficient documentation

## 2020-04-08 LAB — CBC WITH DIFFERENTIAL/PLATELET
Abs Immature Granulocytes: 0.01 10*3/uL (ref 0.00–0.07)
Basophils Absolute: 0 10*3/uL (ref 0.0–0.1)
Basophils Relative: 1 %
Eosinophils Absolute: 0.1 10*3/uL (ref 0.0–0.5)
Eosinophils Relative: 2 %
HCT: 44.1 % (ref 36.0–46.0)
Hemoglobin: 14.5 g/dL (ref 12.0–15.0)
Immature Granulocytes: 0 %
Lymphocytes Relative: 42 %
Lymphs Abs: 2.3 10*3/uL (ref 0.7–4.0)
MCH: 31 pg (ref 26.0–34.0)
MCHC: 32.9 g/dL (ref 30.0–36.0)
MCV: 94.2 fL (ref 80.0–100.0)
Monocytes Absolute: 0.4 10*3/uL (ref 0.1–1.0)
Monocytes Relative: 8 %
Neutro Abs: 2.6 10*3/uL (ref 1.7–7.7)
Neutrophils Relative %: 47 %
Platelets: 193 10*3/uL (ref 150–400)
RBC: 4.68 MIL/uL (ref 3.87–5.11)
RDW: 12.2 % (ref 11.5–15.5)
WBC: 5.5 10*3/uL (ref 4.0–10.5)
nRBC: 0 % (ref 0.0–0.2)

## 2020-04-08 LAB — COMPREHENSIVE METABOLIC PANEL
ALT: 9 U/L (ref 0–44)
AST: 12 U/L — ABNORMAL LOW (ref 15–41)
Albumin: 3.9 g/dL (ref 3.5–5.0)
Alkaline Phosphatase: 85 U/L (ref 38–126)
Anion gap: 7 (ref 5–15)
BUN: 10 mg/dL (ref 6–20)
CO2: 29 mmol/L (ref 22–32)
Calcium: 9.3 mg/dL (ref 8.9–10.3)
Chloride: 106 mmol/L (ref 98–111)
Creatinine, Ser: 0.76 mg/dL (ref 0.44–1.00)
GFR, Estimated: >60 mL/min (ref >60)
Glucose, Bld: 85 mg/dL (ref 70–99)
Potassium: 3.8 mmol/L (ref 3.5–5.1)
Sodium: 142 mmol/L (ref 135–145)
Total Bilirubin: 1.3 mg/dL — ABNORMAL HIGH (ref 0.3–1.2)
Total Protein: 7.1 g/dL (ref 6.5–8.1)

## 2020-04-10 ENCOUNTER — Telehealth: Payer: Self-pay | Admitting: Oncology

## 2020-04-10 NOTE — Telephone Encounter (Signed)
Scheduled appointments per 11/8 los. Spoke to patient who is aware of appointment date and time.  

## 2020-05-01 ENCOUNTER — Other Ambulatory Visit: Payer: Self-pay

## 2020-05-01 ENCOUNTER — Ambulatory Visit
Admission: RE | Admit: 2020-05-01 | Discharge: 2020-05-01 | Disposition: A | Discharge status: Home / Self Care | Payer: BC Managed Care – PPO | Source: Ambulatory Visit | Attending: Oncology | Admitting: Oncology

## 2020-05-01 DIAGNOSIS — Z1231 Encounter for screening mammogram for malignant neoplasm of breast: Secondary | ICD-10-CM

## 2020-05-13 ENCOUNTER — Other Ambulatory Visit: Payer: BC Managed Care – PPO

## 2020-05-13 ENCOUNTER — Ambulatory Visit: Payer: BC Managed Care – PPO | Admitting: Adult Health

## 2020-05-14 ENCOUNTER — Other Ambulatory Visit (HOSPITAL_COMMUNITY): Payer: BC Managed Care – PPO

## 2020-05-17 ENCOUNTER — Encounter (HOSPITAL_BASED_OUTPATIENT_CLINIC_OR_DEPARTMENT_OTHER): Admission: RE | Payer: Self-pay | Source: Home / Self Care

## 2020-05-17 ENCOUNTER — Ambulatory Visit (HOSPITAL_BASED_OUTPATIENT_CLINIC_OR_DEPARTMENT_OTHER): Admission: RE | Admit: 2020-05-17 | Payer: BC Managed Care – PPO | Source: Home / Self Care | Admitting: Plastic Surgery

## 2020-05-17 SURGERY — MAMMOPLASTY, REDUCTION
Anesthesia: General | Site: Breast | Laterality: Right

## 2020-05-26 NOTE — Progress Notes (Signed)
Patient did not show for appointment today.

## 2020-05-27 ENCOUNTER — Inpatient Hospital Stay (HOSPITAL_BASED_OUTPATIENT_CLINIC_OR_DEPARTMENT_OTHER): Payer: BC Managed Care – PPO | Admitting: Adult Health

## 2020-05-27 ENCOUNTER — Inpatient Hospital Stay: Payer: BC Managed Care – PPO | Attending: Oncology

## 2020-05-27 DIAGNOSIS — C50412 Malignant neoplasm of upper-outer quadrant of left female breast: Secondary | ICD-10-CM

## 2020-05-27 DIAGNOSIS — Z171 Estrogen receptor negative status [ER-]: Secondary | ICD-10-CM

## 2020-08-27 ENCOUNTER — Telehealth: Payer: Self-pay

## 2020-08-27 ENCOUNTER — Telehealth: Payer: Self-pay | Admitting: Adult Health

## 2020-08-27 NOTE — Telephone Encounter (Signed)
Scheduled appt per 3/29 sch msg. Pt aware.  

## 2020-08-27 NOTE — Telephone Encounter (Signed)
She called and left a message. She missed appt with Wilber Bihari, NP and would like to reschedule. Scheduling message sent for lab and appt with L. Causey, NP.

## 2020-09-04 ENCOUNTER — Telehealth: Payer: Self-pay | Admitting: Adult Health

## 2020-09-04 NOTE — Telephone Encounter (Signed)
Pt called to r/a appt per 4/6 sch msg. Called pt, no answer. Left msg for pt to call back to r/s.

## 2020-09-05 ENCOUNTER — Inpatient Hospital Stay: Payer: BC Managed Care – PPO | Attending: Adult Health | Admitting: Adult Health

## 2020-09-05 ENCOUNTER — Inpatient Hospital Stay: Payer: BC Managed Care – PPO

## 2020-09-05 DIAGNOSIS — Z5329 Procedure and treatment not carried out because of patient's decision for other reasons: Secondary | ICD-10-CM

## 2020-09-06 ENCOUNTER — Telehealth: Payer: Self-pay | Admitting: Adult Health

## 2020-09-06 NOTE — Progress Notes (Signed)
Patient did not show for her appointment.  No show letter mailed.

## 2020-09-06 NOTE — Telephone Encounter (Signed)
Rescheduled missed appointment per 4/7 los. Patient is aware.

## 2020-10-03 ENCOUNTER — Inpatient Hospital Stay: Payer: BC Managed Care – PPO

## 2020-10-03 ENCOUNTER — Other Ambulatory Visit: Payer: Self-pay

## 2020-10-03 ENCOUNTER — Inpatient Hospital Stay: Payer: BC Managed Care – PPO | Attending: Adult Health | Admitting: Adult Health

## 2020-10-03 ENCOUNTER — Encounter: Payer: Self-pay | Admitting: Adult Health

## 2020-10-03 VITALS — BP 109/70 | HR 79 | Temp 97.7°F | Resp 18 | Ht 70.0 in | Wt 242.2 lb

## 2020-10-03 DIAGNOSIS — Z90710 Acquired absence of both cervix and uterus: Secondary | ICD-10-CM | POA: Insufficient documentation

## 2020-10-03 DIAGNOSIS — Z9012 Acquired absence of left breast and nipple: Secondary | ICD-10-CM | POA: Insufficient documentation

## 2020-10-03 DIAGNOSIS — Z9221 Personal history of antineoplastic chemotherapy: Secondary | ICD-10-CM | POA: Insufficient documentation

## 2020-10-03 DIAGNOSIS — Z853 Personal history of malignant neoplasm of breast: Secondary | ICD-10-CM | POA: Diagnosis present

## 2020-10-03 DIAGNOSIS — Z171 Estrogen receptor negative status [ER-]: Secondary | ICD-10-CM | POA: Diagnosis not present

## 2020-10-03 DIAGNOSIS — C50412 Malignant neoplasm of upper-outer quadrant of left female breast: Secondary | ICD-10-CM

## 2020-10-03 DIAGNOSIS — Z9079 Acquired absence of other genital organ(s): Secondary | ICD-10-CM | POA: Insufficient documentation

## 2020-10-03 DIAGNOSIS — Z79899 Other long term (current) drug therapy: Secondary | ICD-10-CM | POA: Diagnosis not present

## 2020-10-03 DIAGNOSIS — Z7951 Long term (current) use of inhaled steroids: Secondary | ICD-10-CM | POA: Insufficient documentation

## 2020-10-03 DIAGNOSIS — Z923 Personal history of irradiation: Secondary | ICD-10-CM | POA: Insufficient documentation

## 2020-10-03 LAB — CBC WITH DIFFERENTIAL/PLATELET
Abs Immature Granulocytes: 0.01 10*3/uL (ref 0.00–0.07)
Basophils Absolute: 0 10*3/uL (ref 0.0–0.1)
Basophils Relative: 1 %
Eosinophils Absolute: 0.1 10*3/uL (ref 0.0–0.5)
Eosinophils Relative: 2 %
HCT: 43.8 % (ref 36.0–46.0)
Hemoglobin: 14.3 g/dL (ref 12.0–15.0)
Immature Granulocytes: 0 %
Lymphocytes Relative: 42 %
Lymphs Abs: 1.8 10*3/uL (ref 0.7–4.0)
MCH: 31.2 pg (ref 26.0–34.0)
MCHC: 32.6 g/dL (ref 30.0–36.0)
MCV: 95.6 fL (ref 80.0–100.0)
Monocytes Absolute: 0.4 10*3/uL (ref 0.1–1.0)
Monocytes Relative: 10 %
Neutro Abs: 1.9 10*3/uL (ref 1.7–7.7)
Neutrophils Relative %: 45 %
Platelets: 164 10*3/uL (ref 150–400)
RBC: 4.58 MIL/uL (ref 3.87–5.11)
RDW: 12.4 % (ref 11.5–15.5)
WBC: 4.2 10*3/uL (ref 4.0–10.5)
nRBC: 0 % (ref 0.0–0.2)

## 2020-10-03 LAB — COMPREHENSIVE METABOLIC PANEL
ALT: 8 U/L (ref 0–44)
AST: 13 U/L — ABNORMAL LOW (ref 15–41)
Albumin: 3.7 g/dL (ref 3.5–5.0)
Alkaline Phosphatase: 72 U/L (ref 38–126)
Anion gap: 7 (ref 5–15)
BUN: 10 mg/dL (ref 6–20)
CO2: 28 mmol/L (ref 22–32)
Calcium: 9.1 mg/dL (ref 8.9–10.3)
Chloride: 107 mmol/L (ref 98–111)
Creatinine, Ser: 0.74 mg/dL (ref 0.44–1.00)
GFR, Estimated: >60 mL/min (ref >60)
Glucose, Bld: 92 mg/dL (ref 70–99)
Potassium: 3.9 mmol/L (ref 3.5–5.1)
Sodium: 142 mmol/L (ref 135–145)
Total Bilirubin: 1.1 mg/dL (ref 0.3–1.2)
Total Protein: 6.7 g/dL (ref 6.5–8.1)

## 2020-10-03 NOTE — Progress Notes (Signed)
[place]  [phone] [phone]    ID: Paula Berry DOB: Mar 17, 1971  MR#: [id number]  GYF#:[id number]  Patient Care Team: Vernie Shanks, MD as PCP - General (Family Medicine) Rolm Bookbinder, MD as Consulting Physician (General Surgery) Magrinat, Virgie Dad, MD as Consulting Physician (Oncology) Eppie Gibson, MD as Attending Physician (Radiation Oncology) Everlene Farrier, MD as Consulting Physician (Obstetrics and Gynecology) Everlean Alstrom, MD as Attending Physician (Radiology) Irene Limbo, MD as Consulting Physician (Plastic Surgery) OTHER MD:   CHIEF COMPLAINT: Triple negative breast cancer (s/p left mastectomy)  CURRENT TREATMENT: Observation   INTERVAL HISTORY: [name] returns today for follow-up of her triple negative breast cancer. She is now under observation.  She underwent  right screening mammogram on 05/01/2020. That showed no evidence of malignancy and breast density category B.  Her most recent CT chest was about a year ago and showed no evidence of malignancy, but did show some radiation changes in the lung.  She continues to have some nagging discomfort in her left chest wall, however this is not significantly worse than prior.    REVIEW OF SYSTEMS: Forrestine exercises with walking and using 5 and 8 pound wieghts for upper body toning.  She is overall feeling well and would like a script for new bras and breast prostheses today.  She denies any other new issues and a detailed ROS was otherwise non contributory.     COVID 19 VACCINATION STATUS: fully vaccinated [employer]) with booster early October 2021  HISTORY OF CURRENT ILLNESS: From the original intake note:  The patient had a left mammogram with tomography and ultrasonography 07/12/2015 at the [facility] after screening recall for a possible left breast mass.  This found the breast density to be category C.  There was a small lobulated mass in  the lower outer aspect of the left breast, which was not palpable.  Ultrasound showed a small lobulated cyst in that area measuring 0.6 cm.  This was felt to be benign.  In July 2019 she had screening mammography at Dr. Clementeen Hoof office.  I do not have that report but according to the patient it was unremarkable.  In October 2019 however the patient developed left axillary swelling and tenderness and was again set up for left mammography with tomography and left breast ultrasonography at the [facility], 03/11/2018.  Breast density was category C.  Mammography showed numerous markedly enlarged left axillary lymph nodes.  There was periareolar skin thickening.  There was an area of ill-defined distortion in the upper outer left breast and on physical exam there was a firm lump superior and lateral to the left nipple, with swelling of the left axilla.  Ultrasound of the left breast and both upper quadrants found an ill-defined irregular hypoechoic area measuring at least 4.2 cm.  There also multiple scattered cysts.  Ultrasound of the left axilla showed at least 6 abnormal lymph nodes.  On 03/16/2018 the patient underwent right mammography with tomography and right breast ultrasonography at the [facility].  Again breast density was category C.  There were multiple circumscribed equal density masses in the upper outer quadrant of the right breast which appeared stable.  Ultrasound showed diffuse fibrocystic changes.  An additional hypoechoic mass was noted at the 9 o'clock position 3 cm from the nipple measuring 0.8 cm, with no associated vascularity.  A complex solid and cystic mass was noted superficially at the 6 o'clock position 3 cm from the nipple measuring 1.6  cm.  Evaluation of the right axilla was negative.   On 03/18/2018 the patient underwent biopsy of the 2 suspicious areas in the right breast, and they both showed only fibrocystic changes with some chronic inflammation ([id number]).  Biopsy  of the left breast at 1:00 and 1 of the left axillary lymph nodes on 03/16/2018 found an invasive ductal carcinoma, E-cadherin positive, grade 3, estrogen and progesterone receptor negative, HER-2 negative by immunohistochemistry (1+), with an MIB-1 of 15%.  The patient's subsequent history is as detailed below.   PAST MEDICAL HISTORY: Past Medical History:  Diagnosis Date  . Anemia    had iron infusion 08/12/15  . Asthma   . Breast cancer ([place]) 03/2018   Left Breast Cancer  . Cancer [facility])    Breast cancer   . Genital herpes   . GERD (gastroesophageal reflux disease)   . Headache    migraines 2-3 times monthly  . Pneumonia    hospitalizied for pneumonia as a child    PAST SURGICAL HISTORY: Past Surgical History:  Procedure Laterality Date  . ABDOMINAL HYSTERECTOMY Bilateral 08/22/2015   Procedure: HYSTERECTOMY ABDOMINAL, BILATERAL SALPINGECTOMY;  Surgeon: Everlene Farrier, MD;  Location: [facility];  Service: Gynecology;  Laterality: Bilateral;  . MASTECTOMY Left 07/2018  . MASTECTOMY WITH AXILLARY LYMPH NODE DISSECTION Left 08/30/2018   Procedure: Left mastectomy with axillary lymph node dissection;  Surgeon: Rolm Bookbinder, MD;  Location: [facility];  Service: General;  Laterality: Left;  . MYOMECTOMY ABDOMINAL APPROACH    . PORTACATH PLACEMENT N/A 03/30/2018   Procedure: INSERTION PORT-A-CATH WITH [place];  Surgeon: Rolm Bookbinder, MD;  Location: [facility];  Service: General;  Laterality: N/A;    FAMILY HISTORY: Family History  Problem Relation Age of Onset  . Hypertension Other   . Diabetes Cousin        mat first cousin  . Cancer Other   . Dementia Maternal Grandfather   . Lung cancer Paternal Grandmother        d. 49-60  . Brain cancer Paternal Aunt        d. 49, father's mat 1/2 sister  . Diabetes Maternal Grandmother   . Other Paternal Grandfather        d. WWII  . Aneurysm Maternal Uncle        brain  . Migraines Neg Hx    The patient's parents are  in their early 77s as of October 2019.  A paternal aunt had brain cancer at age 49.  A paternal grandmother had lung cancer at age 49.  There is no breast or ovarian cancer history in the family.  However note that the patient has little information on her mother side of the family.   GYNECOLOGIC HISTORY:  No LMP recorded. Patient has had a hysterectomy. 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  Her husband [name]. works for the [employer].  He is a former patient here, with stage IV colon cancer.  They live alone, with no pets.     ADVANCED DIRECTIVES: Not in place   HEALTH MAINTENANCE: Social History   Tobacco Use  . Smoking status: Never Smoker  . Smokeless tobacco: Never Used  Vaping Use  . Vaping  Use: Never used  Substance Use Topics  . Alcohol use: Yes    Alcohol/week: 1.0 standard drink    Types: 1 Glasses of wine per week    Comment: daily  . Drug use: No     Colonoscopy: Never  PAP: Status post hysterectomy  Bone density: Never   Allergies  Allergen Reactions  . Neosporin [Neomycin-Bacitracin Zn-Polymyx] Other (See Comments)    Decreased wound healing with blisters    Current Outpatient Medications  Medication Sig Dispense Refill  . acetaminophen (TYLENOL) 500 MG tablet Take 1,000 mg by mouth every 8 (eight) hours as needed for moderate pain.    Marland Kitchen acyclovir ointment (ZOVIRAX) 5 % Apply 1 application topically 4 (four) times daily as needed (outbreaks).   2  . albuterol (PROVENTIL HFA;VENTOLIN HFA) 108 (90 Base) MCG/ACT inhaler Inhale 2 puffs into the lungs every 6 (six) hours as needed for wheezing or shortness of breath.     . gabapentin (NEURONTIN) 300 MG capsule Take 1 capsule (300  mg total) by mouth at bedtime. 90 capsule 4  . montelukast (SINGULAIR) 10 MG tablet Take 10 mg by mouth every evening.    . mupirocin ointment (BACTROBAN) 2 % Apply to peeling skin BID-TID PRN 22 g 0  . pantoprazole (PROTONIX) 40 MG tablet Take 40 mg by mouth every evening.    . SYMBICORT 160-4.5 MCG/ACT inhaler INL 2 PFS PO BID  3  . valACYclovir (VALTREX) 500 MG tablet Take 500 mg by mouth See admin instructions. Take 500 mg twice daily for 3 days at onset of outbreak then take 500 mg once daily until clear     No current facility-administered medications for this visit.    OBJECTIVE: African-American woman who appears younger than stated age 37:   10/03/20 0846  BP: 109/70  Pulse: 79  Resp: 18  Temp: 97.7 F (36.5 C)  SpO2: 98%     Body mass index is 34.75 kg/m.   Wt Readings from Last 3 Encounters:  10/03/20 242 lb 3.2 oz (109.9 kg)  04/08/20 245 lb 9.6 oz (111.4 kg)  10/04/19 248 lb 8 oz (112.7 kg)  ECOG FS: 1 - Symptomatic but completely ambulatory GENERAL: Patient is a well appearing female in no acute distress HEENT:  Sclerae anicteric.  Mask in place. Neck is supple.  NODES:  No cervical, supraclavicular, or axillary lymphadenopathy palpated.  BREAST EXAM:  Left breast s/p mastectomy and radiation, no sign of local recurrence, right breast benign LUNGS:  Clear to auscultation bilaterally.  No wheezes or rhonchi. HEART:  Regular rate and rhythm. No murmur appreciated. ABDOMEN:  Soft, nontender.  Positive, normoactive bowel sounds. No organomegaly palpated. MSK:  No focal spinal tenderness to palpation. Full range of motion bilaterally in the upper extremities. EXTREMITIES:  No peripheral edema.   SKIN:  Clear with no obvious rashes or skin changes. No nail dyscrasia. NEURO:  Nonfocal. Well oriented.  Appropriate affect.     LAB RESULTS:  CMP     Component Value Date/Time   NA 142 10/03/2020 0817   K 3.9 10/03/2020 0817   CL 107 10/03/2020 0817   CO2 28  10/03/2020 0817   GLUCOSE 92 10/03/2020 0817   BUN 10 10/03/2020 0817   CREATININE 0.74 10/03/2020 0817   CREATININE 0.67 08/11/2018 0900   CALCIUM 9.1 10/03/2020 0817   PROT 6.7 10/03/2020 0817   ALBUMIN 3.7 10/03/2020 0817   AST 13 (L) 10/03/2020 0817   AST 13 (L) 08/11/2018 0900  ALT 8 10/03/2020 0817   ALT 13 08/11/2018 0900   ALKPHOS 72 10/03/2020 0817   BILITOT 1.1 10/03/2020 0817   BILITOT 0.7 08/11/2018 0900   GFRNONAA >60 10/03/2020 0817   GFRNONAA >60 08/11/2018 0900   GFRAA >60 10/04/2019 1508   GFRAA >60 08/11/2018 0900    No results found for: Ronnald Ramp, A1GS, A2GS, BETS, BETA2SER, GAMS, MSPIKE, SPEI  No results found for: Nils Pyle, [facility]  Lab Results  Component Value Date   WBC 4.2 10/03/2020   NEUTROABS 1.9 10/03/2020   HGB 14.3 10/03/2020   HCT 43.8 10/03/2020   MCV 95.6 10/03/2020   PLT 164 10/03/2020      Chemistry      Component Value Date/Time   NA 142 10/03/2020 0817   K 3.9 10/03/2020 0817   CL 107 10/03/2020 0817   CO2 28 10/03/2020 0817   BUN 10 10/03/2020 0817   CREATININE 0.74 10/03/2020 0817   CREATININE 0.67 08/11/2018 0900      Component Value Date/Time   CALCIUM 9.1 10/03/2020 0817   ALKPHOS 72 10/03/2020 0817   AST 13 (L) 10/03/2020 0817   AST 13 (L) 08/11/2018 0900   ALT 8 10/03/2020 0817   ALT 13 08/11/2018 0900   BILITOT 1.1 10/03/2020 0817   BILITOT 0.7 08/11/2018 0900       No results found for: LABCA2  No components found for: KZSWFU932  No results for input(s): INR in the last 168 hours.  No results found for: LABCA2  No results found for: TFT732  No results found for: KGU542  No results found for: HCW237  Lab Results  Component Value Date   CA2729 213.7 (H) 04/07/2018    No components found for: HGQUANT  No results found for: CEA1 / No results found for: CEA1   No results found for: AFPTUMOR  No results found for: CHROMOGRNA  No results found for: HGBA,  HGBA2QUANT, HGBFQUANT, HGBSQUAN (Hemoglobinopathy evaluation)   No results found for: LDH  No results found for: IRON, TIBC, IRONPCTSAT (Iron and TIBC)  No results found for: FERRITIN  Urinalysis No results found for: COLORURINE, APPEARANCEUR, LABSPEC, PHURINE, GLUCOSEU, HGBUR, BILIRUBINUR, KETONESUR, PROTEINUR, UROBILINOGEN, NITRITE, LEUKOCYTESUR   STUDIES: No results found.   ELIGIBLE FOR AVAILABLE RESEARCH PROTOCOL:  no   ASSESSMENT: 50 y.o. [place] woman status post left breast overlapping site biopsy and left axillary lymph node biopsy 03/16/2018 for a clinical T2 N3, stage IIIC invasive ductal carcinoma, triple negative, with an MIB-1 of 15%   (1) Staging studies  (a) CT chest on 04/05/2018: left axillary and chronic left retropectoral adenopathy, supraclavicular adenopathy, left breast skin thickening and mass, 67m left upper lobe pulmonary nodule (repeat ct chest in 3-6 months recommended), bronchiectasis in left lower lobe.  (b) CT neck on 04/05/2018: left supraclavicular adenopathy, ? Abnormality in left frontal lobe of brain  (c) Bone scan on 04/05/2018: no findings suggestive of metastatic disease to bone  (d) MRI brain 04/07/2018: negative  (2) neoadjuvant chemotherapy consisting of doxorubicin and cyclophosphamide in dose dense fashion x4 started 04/07/2018, completed 05/19/2018,  followed by weekly carboplatin and paclitaxel x10 started 06/02/2018, last dose 08/04/2018 (stopped two cycles early due to peripheral neuropathy)  (3) genetics testing 11/18: no deleterious mutations.  Genes tested include: AIP, ALK, APC, ATM, AXIN2,BAP1,  BARD1, BLM, BMPR1A, BRCA1, BRCA2, BRIP1, CASR, CDC73, CDH1, CDK4, CDKN1B, CDKN1C, CDKN2A (p14ARF), CDKN2A (p16INK4a), CEBPA, CHEK2, CTNNA1, DICER1, DIS3L2, EGFR (c.2369C>T, p.Thr790Met variant only), EPCAM (Deletion/duplication testing  only), FH, FLCN, GATA2, GPC3, GREM1 (Promoter region deletion/duplication testing only), HOXB13 (c.251G>A,  p.Gly84Glu), HRAS, KIT, MAX, MEN1, MET, MITF (c.952G>A, p.Glu318Lys variant only), MLH1, MSH2, MSH3, MSH6, MUTYH, NBN, NF1, NF2, NTHL1, PALB2, PDGFRA, PHOX2B, PMS2, POLD1, POLE, POT1, PRKAR1A, PTCH1, PTEN, RAD50, RAD51C, RAD51D, RB1, RECQL4, RET, RUNX1, SDHAF2, SDHA (sequence changes only), SDHB, SDHC, SDHD, SMAD4, SMARCA4, SMARCB1, SMARCE1, STK11, SUFU, TERC, TERT, TMEM127, TP53, TSC1, TSC2, VHL, WRN and WT1.  (4) left mastectomy and axillary lymph node dissection 08/30/2018 showed a residual ypT1a ypN0 invasive ductal carcinoma, grade 2, with negative margins.  (a) 12 axillary nodes were removed  (b) repeat prognostic profile again triple negative, with an MIB-1 of 30%  (5) adjuvant radiation completed 11/22/2018  (6) Caris panel 10/31/2018 found insufficient tissue for PDL 1 testing, MSI was stable, mismatch repair indeterminate, no PI K3 mutation, androgen receptor insufficient tumor   PLAN: Raphaela is doing well today and is clinically and radiographically without any sign of breast cancer recurrence.  Her labs are normal and I reviewed those with her in detail.  She doesn't need a CT chest at this point, however we discussed that we will get that, or any other imaging should she have any new or persistent changes or concerns.  She understands this, and knows to call if she develops any concerns.  We discussed her hydration today, eating plenty of fruits and vegetables, and I reviewed some suggestions for helping with her brittle nails such as a vitamins, well balanced diet, and nailtrition.    She was recommended to continue with her exercise and f/u with her PCP for health maintenance.  She has not yet undergone colonoscopy and I referred her for one today.    She will return in 01/2021 to see Dr. Jana Hakim and in 07/2021 to see me.  She knows to call for any questions that may arise between now and her next appointment.  We are happy to see her sooner if needed.   Total encounter time 44  minutes.* spent in face to face visit time, reviewing orders and imaging, labs, and in documentation.  Wilber Bihari, NP 10/03/20 9:24 AM Medical Oncology and Hematology [facility] [place], [place] [address] Tel. [phone]    Fax. [phone]   *Total Encounter Time as defined by the Centers for Medicare and Medicaid Services includes, in addition to the face-to-face time of a patient visit (documented in the note above) non-face-to-face time: obtaining and reviewing outside history, ordering and reviewing medications, tests or procedures, care coordination (communications with other health care professionals or caregivers) and documentation in the medical record.

## 2020-10-07 ENCOUNTER — Telehealth: Payer: Self-pay | Admitting: Adult Health

## 2020-10-07 NOTE — Telephone Encounter (Signed)
Per 5/9 los, Patient aware 

## 2020-12-26 ENCOUNTER — Encounter: Payer: Self-pay | Admitting: Oncology

## 2021-01-08 NOTE — Progress Notes (Deleted)
NEW PATIENT  Date of Service/Encounter:  01/08/21  Consult requested by: Vernie Shanks, MD    Subjective:   Paula Berry is a 49 y.o. female presenting today for evaluation of No chief complaint on file.   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  Paula Berry was referred by Vernie Shanks, MD.     Paula Berry is a 50 y.o. female presenting for {Blank single:19197::"a food challenge","a drug challenge","skin testing","a sick visit","an evaluation of ***","a follow up visit"}.   ***  ***Otherwise, there is no history of other atopic diseases, including {Blank multiple:19196:o:"asthma","food allergies","drug allergies","environmental allergies","stinging insect allergies","eczema","urticaria","contact dermatitis"}. There is no significant infectious history. ***Vaccinations are up to date.    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 If you have any questions, ask your nurse or doctor.          acetaminophen 500 MG tablet Commonly known as: TYLENOL Take 1,000 mg by mouth every 8 (eight) hours as needed for moderate pain.   acyclovir ointment 5 % Commonly known as: ZOVIRAX Apply 1 application topically 4 (four) times daily as needed (outbreaks).   albuterol 108 (90 Base) MCG/ACT inhaler Commonly known as: VENTOLIN HFA Inhale 2 puffs into the lungs every 6 (six) hours as needed for wheezing or shortness of breath.   gabapentin 300 MG capsule Commonly known as: NEURONTIN Take 1 capsule (300 mg total) by mouth at bedtime.   montelukast 10 MG tablet Commonly known as: SINGULAIR Take 10 mg by mouth every evening.   mupirocin ointment 2 % Commonly known as: Bactroban Apply to peeling skin BID-TID PRN   pantoprazole 40 MG tablet Commonly known as: PROTONIX Take 40 mg by mouth every evening.   Symbicort 160-4.5 MCG/ACT inhaler Generic drug: budesonide-formoterol INL 2 PFS PO BID   valACYclovir 500 MG tablet Commonly known as: VALTREX Take 500 mg by mouth See admin instructions. Take 500 mg twice daily for 3 days at onset of outbreak then take 500 mg once daily until clear        Birth History: {Blank single:19197::"non-contributory","born premature and spent time in the NICU","born at term without complications"}  Developmental History: Paula Berry has met all milestones on time. She has required no {Blank multiple:19196:a:"speech therapy","occupational therapy","physical therapy"}. ***non-contributory  Past Surgical History: Past Surgical History:  Procedure Laterality Date   ABDOMINAL HYSTERECTOMY Bilateral 08/22/2015  Procedure: HYSTERECTOMY ABDOMINAL, BILATERAL SALPINGECTOMY;  Surgeon: Everlene Farrier, MD;  Location: [facility];  Service: Gynecology;  Laterality: Bilateral;   MASTECTOMY Left 07/2018   MASTECTOMY WITH AXILLARY  LYMPH NODE DISSECTION Left 08/30/2018   Procedure: Left mastectomy with axillary lymph node dissection;  Surgeon: Rolm Bookbinder, MD;  Location: [facility];  Service: General;  Laterality: Left;   MYOMECTOMY ABDOMINAL APPROACH     PORTACATH PLACEMENT N/A 03/30/2018   Procedure: INSERTION PORT-A-CATH WITH [place];  Surgeon: Rolm Bookbinder, MD;  Location: [facility];  Service: General;  Laterality: N/A;     Family History: Family History  Problem Relation Age of Onset   Hypertension Other    Diabetes Cousin        mat first cousin   Cancer Other    Dementia Maternal Grandfather    Lung cancer Paternal Grandmother        d. 53-60   Brain cancer Paternal Aunt        d. 86, father's mat 1/2 sister   Diabetes Maternal Grandmother    Other Paternal Grandfather        d. WWII   Aneurysm Maternal Uncle        brain   Migraines Neg Hx      Social History: Paula Berry lives at home with ***.    ROS:  Negative except as per HPI.  Objective:   There were no vitals taken for this visit. There is no height or weight on file to calculate BMI.  Physical Exam:  General Appearance:  Alert, cooperative, no distress, appears stated age  Head:  Normocephalic, without obvious abnormality, atraumatic  Eyes:  Conjunctiva clear, EOM's intact  Nose: Nares normal  Throat: Lips, tongue normal; teeth and gums normal  Neck: Supple, symmetrical  Lungs:   Respirations unlabored, no coughing  Heart:  Appears well perfused  Extremities: No edema  Skin: Skin color, texture, turgor normal, no rashes or lesions on visualized portions of skin  Neurologic: No gross deficits    Diagnostic studies: {Blank single:19197::"none","deferred due to recent antihistamine use","labs sent instead"," "}  Spirometry: {Blank single:19197::"results normal (FEV1: ***%, FVC: ***%, FEV1/FVC: ***%)","results abnormal (FEV1: ***%, FVC: ***%, FEV1/FVC: ***%)"}.    {Blank single:19197::"Spirometry consistent  with mild obstructive disease","Spirometry consistent with moderate obstructive disease","Spirometry consistent with severe obstructive disease","Spirometry consistent with possible restrictive disease","Spirometry consistent with mixed obstructive and restrictive disease","Spirometry uninterpretable due to technique","Spirometry consistent with normal pattern"}. {Blank single:19197::"Albuterol/Atrovent nebulizer","Xopenex/Atrovent nebulizer","Albuterol nebulizer","Albuterol four puffs via MDI","Xopenex four puffs via MDI"} treatment given in clinic with {Blank single:19197::"significant improvement in FEV1 per ATS criteria","significant improvement in FVC per ATS criteria","significant improvement in FEV1 and FVC per ATS criteria","improvement in FEV1, but not significant per ATS criteria","improvement in FVC, but not significant per ATS criteria","improvement in FEV1 and FVC, but not significant per ATS criteria","no improvement"}.  Allergy Studies: {Blank single:19197::"none"," "}    {Blank single:19197::"Allergy testing results were read and interpreted by myself, documented by clinical staff."," "}  Assessment:   No diagnosis found.  Plan/Recommendations:    There are no Patient Instructions on file for this visit.   {Blank single:19197::"This note in its entirety was forwarded to the Provider who requested this consultation."}   Sigurd Sos, MD [facility]

## 2021-01-13 ENCOUNTER — Ambulatory Visit: Payer: BC Managed Care – PPO | Admitting: Internal Medicine

## 2021-01-13 ENCOUNTER — Other Ambulatory Visit: Payer: Self-pay

## 2021-01-13 ENCOUNTER — Encounter: Payer: Self-pay | Admitting: Internal Medicine

## 2021-01-13 VITALS — BP 110/76 | HR 97 | Temp 98.1°F | Resp 16 | Ht 69.0 in | Wt 239.0 lb

## 2021-01-13 DIAGNOSIS — J310 Chronic rhinitis: Secondary | ICD-10-CM | POA: Diagnosis not present

## 2021-01-13 DIAGNOSIS — L501 Idiopathic urticaria: Secondary | ICD-10-CM | POA: Diagnosis not present

## 2021-01-13 DIAGNOSIS — J4540 Moderate persistent asthma, uncomplicated: Secondary | ICD-10-CM | POA: Diagnosis not present

## 2021-01-13 MED ORDER — LORATADINE 10 MG PO TABS: ORAL_TABLET | ORAL | 5 refills | Status: DC

## 2021-01-13 NOTE — Patient Instructions (Addendum)
Chronic Idiopathic Urticaria: - this type of hives is typically not caused by environmental, food, or drug allergies -- no allergy testing is recommended or available - no avoidance of any specific foods/soaps/etc is recommended - this type of hives can last for months to years (~3-5 years on average) - our goal is to help decrease symptoms by at least 80% and to provide improvement in quality of life; we may not be able to achieve complete resolution of hives, but will work with you to get the level of control desired - approximately 50% of patients with chronic hives can have some associated swelling of the face/lips/eyelids (this is not a cause for alarm and does not progress onto systemic allergic reactions) - start claritin (loratadine) '10mg'$  twice daily - if hives are uncontrolled, increase claritin (loratadine) to '20mg'$  twice daily - can use one tablet hydroxyzine nightly as needed for itching/restlessness - continue cimetidine as prescribed  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: - Your breathing test today looked consistent with asthma with mild obstruction - Continue Symbicort 160 mcg 2 puffs twice a day with a spacer; THIS SHOULD BE USED EVERY DAY - Rinse mouth out after use - Use Albuterol (Proair/Ventolin) 2 puffs every 4-6 hours as needed for chest tightness, wheezing, or coughing - Use Albuterol (Proair/Ventolin) 2 puffs 15 minutes prior to exercise if you have symptoms with activity  Continue Singulair 10 mg nightly as prescribed - Use a spacer with all inhalers - please keep track of how often you are needing rescue inhaler Albuterol (Proair/Ventolin) as this will help guide future management - Asthma is not controlled if:  - Symptoms are occurring >2 times a week OR  - >2 times a month nighttime awakenings  - Please call the clinic to schedule a follow up if these symptoms arise - continue protonix as prescribed - for symptoms provoked by  strong smells (perfumes/chemicals) try exercises below, if no improvement after 5 minutes, use albuterol - may consider an injectable asthma medication if we are unable to control asthma symptoms with the above- would consider Dupixent (dupilumab)  Chronic Rhinitis: - allergy testing today deferred-please stop antihistamines (hydroxyzine and claritin) 3 days before your follow-up - Continue Pataday (Olopatadine) 1 drop in each eye daily as needed for itchy/watery eyes.  - Continue Singulair (Montelukast) '10mg'$  daily (49yo+), '5mg'$  daily (6 and up), '4mg'$  daily (1-5yo) - Continue over the counter antihistamine.  Your options include Zyrtec (Cetirizine) '10mg'$ , Claritin (Loratadine) '10mg'$ , Allegra (Fexofenadine) '180mg'$ , or Xyzal (Levocetirinze) '5mg'$  daily. - if the above is not enough,- Consider starting Flonase (fluticasone) 2 sprays in each nostril daily.  You will only get the full benefit of this medication by using it every day.  ------------------------------------------------------------------------------------------ Vocal Cord Dysfunction (VCD) /Inspiratory Laryngeal Obstruction (ILO)/ Exercise- Induced Laryngeal Obstruction Exercises (EILO)  Practice these techniques several times a day, so that when you need them, they will be automatic, and will work right away (or very fast) to open up the spasming (closed) vocal cords.  PREPARATION: ? Loosen clothing at waist, so nothing is tight or snug. Open top buttons of pants or skirt, unzip pant or skirt zippers, pull shirt out over pants or skirt. This prevents pressure on the stomach, which can cause stomach reflux (backup of corrosive liquid) into the esophagus. Gastric reflux is a frequent cause of VCD attacks. ? Sit in a comfortable chair. When you need to use these techniques, you may be standing, lying, or sitting-BUT first try to  learn them while sitting because they are easier to learn in this position. ? Keep water handy. Take sips, so your  mouth and throat will not dry out. Swallow carefully, to avoid choking. ? Put your right (or left) thumb on your belly button, with the rest of your fingers below the thumb, so you are GENTLY touching your belly (lower abdomen). ? Doing this will focus your attention on your lower abdominal muscles. ? Relax your chest. Relax your shoulders. Relax your neck. Relax your throat. This helps you to try to use ONLY your belly (lower abdomen) muscles. Consciously try NOT to use chest muscles, etc. Chest muscle use seems to irritate vocal cords while "belly" muscles tend to relax them.  BREATHE OUT (EXHALE) FIRST WITH slightly PURSED LIPS: ? Since most people cannot inhale, (breathe in), during VCD attacks, please start by breathing out (exhaling) in the special way described below, and this will usually open up the vocal cords, immediately. ? Start, by breathing out (exhaling) with lips "pursed" as if you are trying to gently blow out a candle. ? This is similar to whistling, but your lips will not be as "puckered' as when whistling and your lips will not be pushed forward, like when whistling. If you look in a mirror, you would see a mostly horizontal line of space between the lips, while you exhale the 'ffffffffffffffffffff'. ? This may be silent, or may sound like a gentle wind or breeze, like 'fffffffffffffff' and your lips should be symmetrical, around your teeth. You lower lip will not be touching your upper teeth, like when someone says the name FFFFFFFRank. This is one continuous flow of exhaled air, either silent, or making a slightly windy sound. ? Feel your hand on your belly (lower abdomen) come IN, a little bit toward your back, as you are 'working' only your lower belly muscles. ? This abdominal exhaled 'fffffffffffffffff' alone often stops VCD attacks very quickly.  ? Some prefer to try a variation of exhaling 'ffffffffffff'. Try exhaling quickly, 'ffff', 'fffff', 'ffff'--all part of one  exhalation. Do not inhale in between quick "fff's. This helps some people more quickly open up the spasming vocal cords. This is like blowing out a slightly stubborn candle, exhaling against a tiny bit of resistance (like trying to blow out a trick birthday candle). ? If any of this helps, try to slow down the exhalations, and gently exhale 'ffffffffffffffffff'. ? Some people prefer to exhale gently 'ssssssssssssssss' or 'shhhhhhhhhhhhh' rather than 'fffffffffffffff', etc. Choose what works best in your case, or what is most comfortable for you. ? You may need to repeat exhaling 'ffffffffffffff' (or 'ffff', 'ffff', 'ffff'), etc, several times to relax the spasming vocal cords. Repeating this often stops a VCD attack so that you can inhale (breathe in) again.

## 2021-01-13 NOTE — Progress Notes (Signed)
NEW PATIENT Date of Service/Encounter:  01/13/21 Referring provider: Vernie Shanks, MD Primary care provider: Vernie Shanks, MD  Subjective:   Chief Complaint  Patient presents with   Urticaria   Paula Berry is a 50 y.o. female with a PMHx of triple negative breast cancer status post left mastectomy presenting today for evaluation of hives. History obtained from: chart review and patient.   Hives: Started about 1.5 months ago.  She did start nitroglycerin for achilles tendonitis on her ankles about 1.5 months ago.  A few days following this, broke out into hives.  She then discontinued this medication, but hives continued.  They were occurring daily until she started antihistamines, now occurring less frequently (once per week) affecting her arms, thighs, under her breasts and her trunk.   Did associate swelling of her lips and mouth on July 18th (one time occurrence).  Did not have shrimp this day.   Taking hydroxyzine 1 tablet at bedtime.  Cimetidine one in the morning. She was prescribed this as well as Epi per her dermatologist.  She has had one course of steroids for hives. Her PCP felt shrimp might be a trigger because once she ate it, she felt it tasted funny.  However, had shrimp a week later without "funny taste" or hives/swelling. She had not had any shrimp during the hives/swelling episodes.  No obvious triggers.  She did change her body wash to [place] over one month ago, but now back to her hypoallergenic products despite persistent hives.  Asthma: - Current regiment: Symbicort 160 mcg, albuterol, Singulair, managed by PCP. Only using Symbicort in block therapy during flares.  Using rescue inhaler 1-3 times per week. Exercises (aerobic exercises 2-3 times per week, and this is a trigger), strong perfumes/chemicals will affect her.  Trouble breathing in, chest tightness, + wheezing.  Hard for her to recover from colds.  At age 49 yo, hospitalized for pneumonia.   + 2 walking pneumonias in her lifetime, yearly episodes of bronchitis 1-2 x per year, OCS at least once per year, last year required two rounds of OCS.  Recent AEC 100.   History of radiation therapy with known bronchiectasis on CT imaging.  Other allergy screening: Rhino conjunctivitis:  yes-uses Pataday for itchy/watery eyes, rhinorrhea, and PND year long with flares in Winter and Spring, uses Claritin 10 mg daily on top of Singulair Food allergy: no Medication allergy:  yes-Neosporin-wounds take longer to heal when using, blisters Hymenoptera allergy: no Eczema:no  Review of previous imaging and labs:  Last CT on 09/26/19: Expected interval development of radiation fibrosis of the anterior left upper lobe with resolution of previously seen heterogeneous airspace opacities. 2.  No evidence of metastatic disease in the chest. 3. Underlying post infectious or inflammatory bronchiectasis, scarring, and volume loss of the left lower lobe. CBCd on 10/03/20-AEC 100  Past Medical History: Past Medical History:  Diagnosis Date   Anemia    had iron infusion 08/12/15   Asthma    Breast cancer ([place]) 03/2018   Left Breast Cancer   Cancer ([place])    Breast cancer    Genital herpes    GERD (gastroesophageal reflux disease)    Headache    migraines 2-3 times monthly   Pneumonia    hospitalizied for pneumonia as a child   Medication List:  Current Outpatient Medications  Medication Sig Dispense Refill   acetaminophen (TYLENOL) 500 MG tablet Take 1,000 mg by mouth every 8 (eight) hours as  needed for moderate pain.     albuterol (PROVENTIL HFA;VENTOLIN HFA) 108 (90 Base) MCG/ACT inhaler Inhale 2 puffs into the lungs every 6 (six) hours as needed for wheezing or shortness of breath.      cimetidine (TAGAMET) 400 MG tablet Take 400 mg by mouth 3 (three) times daily.     EPINEPHrine 0.3 mg/0.3 mL IJ SOAJ injection Inject 0.3 mg into the muscle once.     hydrOXYzine (VISTARIL) 25 MG capsule Take  25-50 mg by mouth at bedtime.     loratadine (CLARITIN) 10 MG tablet Take 1 tablet twice daily for hives. Can increase to maximum dose of 2 tablets twice daily if needed. Do not combine with other antihistamines. 30 tablet 5   montelukast (SINGULAIR) 10 MG tablet Take 10 mg by mouth every evening.     pantoprazole (PROTONIX) 40 MG tablet Take 40 mg by mouth every evening.     SYMBICORT 160-4.5 MCG/ACT inhaler INL 2 PFS PO BID  3   valACYclovir (VALTREX) 500 MG tablet Take 500 mg by mouth See admin instructions. Take 500 mg twice daily for 3 days at onset of outbreak then take 500 mg once daily until clear     No current facility-administered medications for this visit.   Known Allergies:  Allergies  Allergen Reactions   Neosporin [Neomycin-Bacitracin Zn-Polymyx] Other (See Comments)    Decreased wound healing with blisters   Past Surgical History: Past Surgical History:  Procedure Laterality Date   ABDOMINAL HYSTERECTOMY Bilateral 08/22/2015   Procedure: HYSTERECTOMY ABDOMINAL, BILATERAL SALPINGECTOMY;  Surgeon: Everlene Farrier, MD;  Location: [facility];  Service: Gynecology;  Laterality: Bilateral;   MASTECTOMY Left 07/2018   MASTECTOMY WITH AXILLARY LYMPH NODE DISSECTION Left 08/30/2018   Procedure: Left mastectomy with axillary lymph node dissection;  Surgeon: Rolm Bookbinder, MD;  Location: [facility];  Service: General;  Laterality: Left;   MYOMECTOMY ABDOMINAL APPROACH     PORTACATH PLACEMENT N/A 03/30/2018   Procedure: INSERTION PORT-A-CATH WITH [place];  Surgeon: Rolm Bookbinder, MD;  Location: [facility];  Service: General;  Laterality: N/A;   Family History: Family History  Problem Relation Age of Onset   Hypertension Other    Diabetes Cousin        mat first cousin   Cancer Other    Dementia Maternal Grandfather    Lung cancer Paternal Grandmother        d. 56-60   Brain cancer Paternal Aunt        d. 30, father's mat 1/2 sister   Diabetes Maternal Grandmother     Other Paternal Grandfather        d. 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   ROS:  All other systems negative except as noted per HPI.  Objective:  Blood pressure 110/76, pulse 97, temperature 98.1 F (36.7 C), temperature source Temporal, resp. rate 16, height '5\' 9"'$  (1.753 m), weight 239 lb (108.4 kg), SpO2 97 %. Body mass index is 35.29 kg/m. Physical Exam:  General Appearance:  Alert, cooperative, no distress, appears stated age  Head:  Normocephalic, without obvious abnormality, atraumatic  Eyes:  Conjunctiva  clear, EOM's intact  Nose: Nares normal, hypertrophic turbinates, no visible anterior polyps, normal mucosa, nasal ring on left nare  Throat: Lips, tongue normal; teeth and gums normal, normal posterior pharynx  Neck: Supple, symmetrical  Lungs:   Clear to auscultation bilaterally, prolonged expiratory phase, no wheezing, rales, or crackles, respirations unlabored, no coughing  Heart:  Regular Rate and rhythm, no murmurs, appears well perfused  Extremities: No edema  Skin: Skin color, texture, turgor normal, no rashes or lesions on visualized portions of skin  Neurologic: No gross deficits   Diagnostics: Spirometry:  Tracings reviewed. Her effort: Good reproducible efforts. FVC: 2.49L FEV1: 2.05L, 73% predicted FEV1/FVC ratio: 100% Interpretation: Spirometry consistent with mixed obstructive and restrictive disease.  Please see scanned spirometry results for details.  Scooping on flow/volume loops. History of bronchiectasis from radiation therapy following breast cancer.  Skin Testing: Deferred due to recent antihistamines use.  Assessment:  Severe persistent  asthma without complication - Plan: Spirometry with Graph  Chronic idiopathic urticaria  Chronic rhinitis Plan/Recommendations:   Patient Instructions  Chronic Idiopathic Urticaria: - this type of hives is typically not caused by environmental, food, or drug allergies -- no allergy testing is recommended or available - no avoidance of any specific foods/soaps/etc is recommended - this type of hives can last for months to years (~3-5 years on average) - our goal is to help decrease symptoms by at least 80% and to provide improvement in quality of life; we may not be able to achieve complete resolution of hives, but will work with you to get the level of control desired - approximately 50% of patients with chronic hives can have some associated swelling of the face/lips/eyelids (this is not a cause for alarm and does not progress onto systemic allergic reactions) - start claritin (loratadine) '10mg'$  twice daily - if hives are uncontrolled, increase claritin (loratadine) to '20mg'$  twice daily - can use one tablet hydroxyzine nightly as needed for itching/restlessness - continue cimetidine as prescribed  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: - Your breathing test today looked consistent with asthma with mild obstruction - Continue Symbicort 160 mcg 2 puffs twice a day with a spacer; THIS SHOULD BE USED EVERY DAY - Rinse mouth out after use - Use Albuterol (Proair/Ventolin) 2 puffs every 4-6 hours as needed for chest tightness, wheezing, or coughing - Use Albuterol (Proair/Ventolin) 2 puffs 15 minutes prior to exercise if you have symptoms with activity  Continue Singulair 10 mg nightly as prescribed - Use a spacer with all inhalers - please keep track of how often you are needing rescue inhaler Albuterol (Proair/Ventolin) as this will help guide future management - Asthma is not controlled if:  - Symptoms are occurring >2 times a week OR  - >2 times a month  nighttime awakenings  - Please call the clinic to schedule a follow up if these symptoms arise - continue protonix as prescribed - for symptoms provoked by strong smells (perfumes/chemicals) try exercises below, if no improvement after 5 minutes, use albuterol - may consider an injectable asthma medication if we are unable to control asthma symptoms with the above- would consider Dupixent (dupilumab)  Chronic Rhinitis: - allergy testing today deferred-please stop antihistamines (hydroxyzine and claritin) 3 days before your follow-up - Continue Pataday (Olopatadine) 1 drop in each eye daily as needed for itchy/watery eyes.  - Continue Singulair (Montelukast) '10mg'$  daily (49yo+), '5mg'$  daily (6 and up), '4mg'$  daily (1-5yo) - Continue over the counter antihistamine.  Your  options include Zyrtec (Cetirizine) '10mg'$ , Claritin (Loratadine) '10mg'$ , Allegra (Fexofenadine) '180mg'$ , or Xyzal (Levocetirinze) '5mg'$  daily. - if the above is not enough,- Consider starting Flonase (fluticasone) 2 sprays in each nostril daily.  You will only get the full benefit of this medication by using it every day.  Return in about 6 weeks (around 02/24/2021).  This note in its entirety was forwarded to the Provider who requested this consultation.  Thank you for your kind referral. I appreciate the opportunity to take part in Kineta's care. Please do not hesitate to contact me with questions.  Sincerely,  Sigurd Sos, MD [facility] [place]

## 2021-02-21 NOTE — Patient Instructions (Addendum)
Chronic Idiopathic Urticaria: - allergy testing today was positive to grasses, trees, molds, dust mites and cat - our goal is to help decrease symptoms by at least 80% and to provide improvement in quality of life; we may not be able to achieve complete resolution of hives, but will work with you to get the level of control desired - approximately 50% of patients with chronic hives can have some associated swelling of the face/lips/eyelids (this is not a cause for alarm and does not progress onto systemic allergic reactions) - continue claritin (loratadine) 10mg  twice daily - if hives are uncontrolled, increase claritin (loratadine) to 20mg  twice daily - can use one tablet hydroxyzine nightly as needed for itching/restlessness (in place of one of your claritin) - continue cimetidine as prescribed  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: - Your breathing test today continues to show a mixed defect (obstructive and restrictive) - Continue Symbicort 160 mcg 2 puffs twice a day with a spacer; THIS SHOULD BE USED EVERY DAY - Rinse mouth out after use - Use Albuterol (Proair/Ventolin) 2 puffs every 4-6 hours as needed for chest tightness, wheezing, or coughing  Continue Singulair 10 mg nightly as prescribed - Use a spacer with all inhalers - Asthma is not controlled if:  - Symptoms are occurring >2 times a week OR  - >2 times a month nighttime awakenings  - Please call the clinic to schedule a follow up if these symptoms arise - continue protonix as prescribed - for symptoms provoked by strong smells (perfumes/chemicals) try exercises below, if no improvement after 5 minutes, use albuterol - may consider an injectable asthma medication if we are unable to control asthma symptoms with the above- would consider Xolair (omalizumab) or Dupixent (dupilumab)  Chronic Rhinitis-seasonal and perennial allergic rhinitis: - allergy testing as above (avoidance measures below) - Continue Pataday  (Olopatadine) 1 drop in each eye daily as needed for itchy/watery eyes.  - Continue Singulair (Montelukast) 10mg  daily  - Continue Claritin as above - if the above is not enough,- Consider starting Flonase (fluticasone) 2 sprays in each nostril daily.  You will only get the full benefit of this medication by using it every day.  ----------------------------------------------------------------------------------------- Vocal Cord Dysfunction (VCD) /Inspiratory Laryngeal Obstruction (ILO)/ Exercise- Induced Laryngeal Obstruction Exercises (EILO)  Practice these techniques several times a day, so that when you need them, they will be automatic, and will work right away (or very fast) to open up the spasming (closed) vocal cords.  PREPARATION: ? Loosen clothing at waist, so nothing is tight or snug. Open top buttons of pants or skirt, unzip pant or skirt zippers, pull shirt out over pants or skirt. This prevents pressure on the stomach, which can cause stomach reflux (backup of corrosive liquid) into the esophagus. Gastric reflux is a frequent cause of VCD attacks. ? Sit in a comfortable chair. When you need to use these techniques, you may be standing, lying, or sitting-BUT first try to learn them while sitting because they are easier to learn in this position. ? Keep water handy. Take sips, so your mouth and throat will not dry out. Swallow carefully, to avoid choking. ? Put your right (or left) thumb on your belly button, with the rest of your fingers below the thumb, so you are GENTLY touching your belly (lower abdomen). ? Doing this will focus your attention on your lower abdominal muscles. ? Relax your chest. Relax your shoulders. Relax your neck. Relax your throat. This helps you to  try to use ONLY your belly (lower abdomen) muscles. Consciously try NOT to use chest muscles, etc. Chest muscle use seems to irritate vocal cords while "belly" muscles tend to relax them.  BREATHE OUT  (EXHALE) FIRST WITH slightly PURSED LIPS: ? Since most people cannot inhale, (breathe in), during VCD attacks, please start by breathing out (exhaling) in the special way described below, and this will usually open up the vocal cords, immediately. ? Start, by breathing out (exhaling) with lips "pursed" as if you are trying to gently blow out a candle. ? This is similar to whistling, but your lips will not be as "puckered' as when whistling and your lips will not be pushed forward, like when whistling. If you look in a mirror, you would see a mostly horizontal line of space between the lips, while you exhale the 'ffffffffffffffffffff'. ? This may be silent, or may sound like a gentle wind or breeze, like 'fffffffffffffff' and your lips should be symmetrical, around your teeth. You lower lip will not be touching your upper teeth, like when someone says the name FFFFFFFRank. This is one continuous flow of exhaled air, either silent, or making a slightly windy sound. ? Feel your hand on your belly (lower abdomen) come IN, a little bit toward your back, as you are 'working' only your lower belly muscles. ? This abdominal exhaled 'fffffffffffffffff' alone often stops VCD attacks very quickly.  ? Some prefer to try a variation of exhaling 'ffffffffffff'. Try exhaling quickly, 'ffff', 'fffff', 'ffff'--all part of one exhalation. Do not inhale in between quick "fff's. This helps some people more quickly open up the spasming vocal cords. This is like blowing out a slightly stubborn candle, exhaling against a tiny bit of resistance (like trying to blow out a trick birthday candle). ? If any of this helps, try to slow down the exhalations, and gently exhale 'ffffffffffffffffff'. ? Some people prefer to exhale gently 'ssssssssssssssss' or 'shhhhhhhhhhhhh' rather than 'fffffffffffffff', etc. Choose what works best in your case, or what is most comfortable for you. ? You may need to repeat exhaling  'ffffffffffffff' (or 'ffff', 'ffff', 'ffff'), etc, several times to relax the spasming vocal cords. Repeating this often stops a VCD attack so that you can inhale (breathe in) again.  DUST MITE AVOIDANCE MEASURES:  There are three main measures that need and can be taken to avoid house dust mites:  Reduce accumulation of dust in general -reduce furniture, clothing, carpeting, books, stuffed animals, especially in bedroom  Separate yourself from the dust -use pillow and mattress encasements (can be found at stores such as [employer] or online) -avoid direct exposure to air condition flow -use a HEPA filter device, especially in the bedroom; you can also use a HEPA filter vacuum cleaner -wipe dust with a moist towel instead of a dry towel or broom when cleaning  Decrease mites and/or their secretions -wash clothing and linen and stuffed animals at highest temperature possible, at least every 2 weeks -stuffed animals can also be placed in a bag and put in a freezer overnight  Despite the above measures, it is impossible to eliminate dust mites or their allergen completely from your home.  With the above measures the burden of mites in your home can be diminished, with the goal of minimizing your allergic symptoms.  Success will be reached only when implementing and using all means together.  Reducing Pollen Exposure  The American Academy of Allergy, Asthma and Immunology suggests the following steps  to reduce your exposure to pollen during allergy seasons.    Do not hang sheets or clothing out to dry; pollen may collect on these items. Do not mow lawns or spend time around freshly cut grass; mowing stirs up pollen. Keep windows closed at night.  Keep car windows closed while driving. Minimize morning activities outdoors, a time when pollen counts are usually at their highest. Stay indoors as much as possible when pollen counts or humidity is high and on windy days when pollen  tends to remain in the air longer. Use air conditioning when possible.  Many air conditioners have filters that trap the pollen spores. Use a HEPA room air filter to remove pollen form the indoor air you breathe.  Control of Mold Allergen   Mold and fungi can grow on a variety of surfaces provided certain temperature and moisture conditions exist.  Outdoor molds grow on plants, decaying vegetation and soil.  The major outdoor mold, Alternaria and Cladosporium, are found in very high numbers during hot and dry conditions.  Generally, a late Summer - Fall peak is seen for common outdoor fungal spores.  Rain will temporarily lower outdoor mold spore count, but counts rise rapidly when the rainy period ends.  The most important indoor molds are Aspergillus and Penicillium.  Dark, humid and poorly ventilated basements are ideal sites for mold growth.  The next most common sites of mold growth are the bathroom and the kitchen.  Outdoor (Seasonal) Mold Control  Positive outdoor molds via skin testing: Bipolaris (Helminthsporium), Drechslera (Curvalaria), Mucor, and Epicoccum  Use air conditioning and keep windows closed Avoid exposure to decaying vegetation. Avoid leaf raking. Avoid grain handling. Consider wearing a face mask if working in moldy areas.    Indoor (Perennial) Mold Control   Positive indoor molds via skin testing: Fusarium, Aureobasidium (Pullulara), Rhizopus, Candida, and Trichophyton  Maintain humidity below 50%. Clean washable surfaces with 5% bleach solution. Remove sources e.g. contaminated carpets.

## 2021-02-21 NOTE — Progress Notes (Signed)
FOLLOW UP Date of Service/Encounter:  02/24/21   Subjective:  Paula Berry (DOB: 1970-08-01) is a 50 y.o. female who returns to the [facility] on 02/24/2021 in re-evaluation of the following: chronic hives, severe persistent asthma, chronic rhinitis History obtained from: chart review and patient.  For Review, LV was on 01/13/21  with Dr.Deke Tilghman seen for chronic hives (started on claritin 10 mg BID and plan to increase 20 mg BID, continues on cimetidine and hydroxyzine nightly). Moderate persistent asthma: History of bronchiectasis from radiation therapy following triple negative breast cancer s/p left mastectomy Arlyce Harman at her visit showed likely mixed defect with normal ratio, 73% FEV1.  Continued on Symbicort 160, 2 puffs BID, albuterol PRN, montelukast nightly. Chronic rhinitis-allergy testing deferred at last visit (on AH). Continued on Pataday, Singulair with plan to stat Flonase 2 sprays daily. -------------------  Today she presents for follow-up. - Chronic hives: She has had two flare-ups around her mouth.  Used a lip balm and her mouth got puffy.  A few nights ago, had some pistachios and her mouth got puffy.  She did have a rash on her breast bones and this lasted 3 to 4 days.  She is taking Claritin 1-2 in AM and 1 hydroxyzine or Claritin at night.  Her symptoms are less frequent, but not completely controlled. - Chronic rhinitis: She is using Pataday and Singulair for nasal symptoms only. - Moderate Persistent Asthma (with mixed defect): breathing is about the same as last time.  She is mostly consistent with Symbicort day and night.  Does use albuterol with strong smells and it helps.  Did not try VCD exercises.  She is not taking her Singulair.  She feels like her breathing is better when she takes it.  Allergies as of 02/24/2021       Reactions   Neosporin [neomycin-bacitracin Zn-polymyx] Other (See Comments)   Decreased wound healing with blisters         Medication List        Accurate as of February 24, 2021  5:16 PM. If you have any questions, ask your nurse or doctor.          acetaminophen 500 MG tablet Commonly known as: TYLENOL Take 1,000 mg by mouth every 8 (eight) hours as needed for moderate pain.   albuterol 108 (90 Base) MCG/ACT inhaler Commonly known as: VENTOLIN HFA Inhale 2 puffs into the lungs every 6 (six) hours as needed for wheezing or shortness of breath.   cimetidine 400 MG tablet Commonly known as: TAGAMET Take 400 mg by mouth 3 (three) times daily.   EPINEPHrine 0.3 mg/0.3 mL Soaj injection Commonly known as: EPI-PEN Inject 0.3 mg into the muscle once.   hydrOXYzine 25 MG capsule Commonly known as: VISTARIL Take 25-50 mg by mouth at bedtime.   loratadine 10 MG tablet Commonly known as: Claritin Take 1 tablet twice daily for hives. Can increase to maximum dose of 2 tablets twice daily if needed. Do not combine with other antihistamines.   montelukast 10 MG tablet Commonly known as: SINGULAIR Take 10 mg by mouth every evening.   pantoprazole 40 MG tablet Commonly known as: PROTONIX Take 40 mg by mouth every evening.   Symbicort 160-4.5 MCG/ACT inhaler Generic drug: budesonide-formoterol INL 2 PFS PO BID   valACYclovir 500 MG tablet Commonly known as: VALTREX Take 500 mg by mouth See admin instructions. Take 500 mg twice daily for 3 days at onset of outbreak then take 500  mg once daily until clear       Past Medical History:  Diagnosis Date   Anemia    had iron infusion 08/12/15   Asthma    Breast cancer ([place]) 03/2018   Left Breast Cancer   Cancer ([place])    Breast cancer    Genital herpes    GERD (gastroesophageal reflux disease)    Headache    migraines 2-3 times monthly   Pneumonia    hospitalizied for pneumonia as a child   Past Surgical History:  Procedure Laterality Date   ABDOMINAL HYSTERECTOMY Bilateral 08/22/2015   Procedure: HYSTERECTOMY ABDOMINAL, BILATERAL  SALPINGECTOMY;  Surgeon: Everlene Farrier, MD;  Location: [facility];  Service: Gynecology;  Laterality: Bilateral;   MASTECTOMY Left 07/2018   MASTECTOMY WITH AXILLARY LYMPH NODE DISSECTION Left 08/30/2018   Procedure: Left mastectomy with axillary lymph node dissection;  Surgeon: Rolm Bookbinder, MD;  Location: [facility];  Service: General;  Laterality: Left;   MYOMECTOMY ABDOMINAL APPROACH     PORTACATH PLACEMENT N/A 03/30/2018   Procedure: INSERTION PORT-A-CATH WITH [place];  Surgeon: Rolm Bookbinder, MD;  Location: [facility];  Service: General;  Laterality: N/A;   Otherwise, there have been no changes to her past medical history, surgical history, family history, or social history.  ROS: All others negative except as noted per HPI.   Objective:  BP 114/74 (BP Location: Right Arm, Patient Position: Sitting, Cuff Size: Normal)   Pulse 88   Temp 97.7 F (36.5 C) (Temporal)   Resp 12   SpO2 97%  There is no height or weight on file to calculate BMI. Physical Exam: General Appearance:  Alert, cooperative, no distress, appears stated age  Head:  Normocephalic, without obvious abnormality, atraumatic  Eyes:  Conjunctiva clear, EOM's intact  Nose: Nares normal, hypertrophic turbinates, nasal ring on left side, normal mucosa  Throat: Lips, tongue normal; teeth and gums normal, normal posterior oropharynx  Neck: Supple, symmetrical  Lungs:   CTAB, no wheezing, Respirations unlabored, no coughing  Heart:  RRR, no murmur, Appears well perfused  Extremities: No edema  Skin: Skin color, texture, turgor normal, no rashes or lesions on visualized portions of skin  Neurologic: No gross deficits   Spirometry:  Tracings reviewed. Her effort: Good reproducible efforts. FVC: 2.23L FEV1: 1.97L, 70% predicted FEV1/FVC ratio: 107% Interpretation: Spirometry consistent with mixed obstructive and restrictive disease.  Please see scanned spirometry results for details.  Skin Testing:  Environmental allergy panel and select foods. Adequate positive and negative controls. Results discussed with patient/family.  Airborne Adult Perc - 02/24/21 1619     Time Antigen Placed 1619    Allergen Manufacturer Lavella Hammock    Location Back    Number of Test 59    1. Control-Buffer 50% Glycerol Negative    2. Control-Histamine 1 mg/ml 3+    3. Albumin saline Negative    4. [place] 3+    5. [place] Negative    6. [place] Negative    7. [place] Blue Negative    8. Meadow Fescue 3+    9. Perennial Rye 3+    10. Sweet Vernal 3+    11. Timothy 3+    12. Cocklebur Negative    13. Burweed Marshelder Negative    14. Ragweed, short Negative    15. Ragweed, Giant Negative    16. Plantain,  English Negative    17. Lamb's Quarters Negative    18. Sheep Sorrell Negative    19. Rough Pigweed Negative  Norman, Rough Negative    21. Mugwort, Common Negative    22. Ash mix Negative    23. Birch mix Negative    24. Beech American Negative    25. Box, Elder Negative    26. Cedar, red Negative    27. Cottonwood, [place] Negative    28. Elm mix Negative    29. Hickory 4+    30. Maple mix 2+    31. Oak, [place] mix 3+    32. Pecan Pollen Negative    33. Pine mix Negative    34. Sycamore Eastern 3+    35. Lowry, Black Pollen 3+    36. Alternaria alternata Negative    37. Cladosporium Herbarum 3+    38. Aspergillus mix Negative    39. Penicillium mix Negative    40. Bipolaris sorokiniana (Helminthosporium) 4+    41. Drechslera spicifera (Curvularia) 3+    42. Mucor plumbeus 3+    43. Fusarium moniliforme 3+    44. Aureobasidium pullulans (pullulara) 3+    45. Rhizopus oryzae 3+    46. Botrytis cinera Negative    47. Epicoccum nigrum 3+    48. Phoma betae Negative    49. Candida Albicans Negative    50. Trichophyton mentagrophytes 4+    51. Mite, D Farinae  5,000 AU/ml 3+    52. Mite, D Pteronyssinus  5,000 AU/ml 3+    53. Cat Hair 10,000 BAU/ml 2+    54.  Dog Epithelia  Negative    55. Mixed Feathers Negative    56. Horse Epithelia Negative    57. Cockroach, German Negative    58. Mouse Negative    59. Tobacco Leaf Negative             Food Adult Perc - 02/24/21 1700     Time Antigen Placed 1619    Allergen Manufacturer Lavella Hammock    Location Back    Number of allergen test 29    1. Peanut Negative    2. Soybean Negative    3. Wheat Negative    4. Sesame Negative    5. Milk, cow Negative    6. Egg White, Chicken Negative    7. Casein Negative    8. Shellfish Mix Negative    9. Fish Mix Negative    10. Cashew Negative    11. Pecan Food Negative    12. [place] Negative    13. Almond Negative    14. Hazelnut Negative    15. [place] nut Negative    16. Coconut Negative    17. Pistachio Negative    18. Catfish Negative    19. Bass Negative    20. Trout Negative    21. Tuna Negative    22. Salmon Negative    23. Flounder Negative    24. Codfish Negative    25. Shrimp Negative    26. Crab Negative    27. Lobster Negative    28. Oyster Negative    29. Scallops Negative             Allergy testing results were read and interpreted by myself, documented by clinical staff.  Assessment:  Chronic idiopathic urticaria - partially controlled, but improved - food allergy testing negative - environmental panel positive for multiple allergens, will trial allergen avoidance as below but suspect idiopathic phenotype Chronic rhinitis - determined allergic (perennial and seasonal) today based on testing - continue pataday eye drops and singulair - can add  INCS or AH if needed Moderate persistent asthma without complication - mixed restrictive defect - restart singulair - consider full PFTs Plan/Recommendations:   Patient Instructions  Chronic Idiopathic Urticaria: - allergy testing today was positive to grasses, trees, molds, dust mites and cat  - allergen avoidance as below - continue claritin (loratadine) 10mg  twice daily - if  hives are uncontrolled, increase claritin (loratadine) to 20mg  twice daily - can use one tablet hydroxyzine nightly as needed for itching/restlessness (in place of one of your claritin) - continue cimetidine as prescribed - once hives controlled for at least 3 months, okay to start coming off some of your medications (start with cimetidine, then claritin) - if remain uncontrolled, will consider Xolair (omalizumab)-paperwork provided today  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: - Your breathing test today continues to show a mixed defect (obstructive and restrictive) - Continue Symbicort 160 mcg 2 puffs twice a day with a spacer; THIS SHOULD BE USED EVERY DAY - Rinse mouth out after use - Use Albuterol (Proair/Ventolin) 2 puffs every 4-6 hours as needed for chest tightness, wheezing, or coughing  Continue Singulair 10 mg nightly as prescribed - Use a spacer with all inhalers - Asthma is not controlled if:  - Symptoms are occurring >2 times a week OR  - >2 times a month nighttime awakenings  - Please call the clinic to schedule a follow up if these symptoms arise - continue protonix as prescribed - for symptoms provoked by strong smells (perfumes/chemicals) try exercises below, if no improvement after 5 minutes, use albuterol - may consider an injectable asthma medication if we are unable to control asthma symptoms with the above- would consider Dupixent (dupilumab) as well as full PFTs  Chronic Rhinitis: - Continue Pataday (Olopatadine) 1 drop in each eye daily as needed for itchy/watery eyes.  - Continue Singulair (Montelukast) 10mg  daily  - Continue Claritin as above - if the above is not enough,- Consider starting Flonase (fluticasone) 2 sprays in each nostril daily.  You will only get the full benefit of this medication by using it every day.  Follow-up in 3 months.  Sigurd Sos, MD  [facility]

## 2021-02-23 NOTE — Progress Notes (Signed)
[facility]  [phone] [phone]    ID: Paula Berry DOB: [birth year]  MR#: [id number]  OEH#:[id number]  Patient Care Team: Vernie Shanks, MD as PCP - General (Family Medicine) Rolm Bookbinder, MD as Consulting Physician (General Surgery) Yovana Scogin, Virgie Dad, MD as Consulting Physician (Oncology) Eppie Gibson, MD as Attending Physician (Radiation Oncology) Everlene Farrier, MD as Consulting Physician (Obstetrics and Gynecology) Everlean Alstrom, MD as Attending Physician (Radiology) Irene Limbo, MD as Consulting Physician (Plastic Surgery) OTHER MD:   CHIEF COMPLAINT: Triple negative breast cancer (s/p left mastectomy)  CURRENT TREATMENT: Observation   INTERVAL HISTORY: [name] was scheduled today for follow-up of her triple negative breast cancer. However she did not show.  Since her last visit, she has not undergone any additional studies. Her most recent right mammogram was on 05/01/2020.   REVIEW OF SYSTEMS: [place]    COVID 23 VACCINATION STATUS: fully vaccinated [employer]) with booster early October 2021   HISTORY OF CURRENT ILLNESS: From the original intake note:  The patient had a left mammogram with tomography and ultrasonography 07/12/2015 at the [facility] after screening recall for a possible left breast mass.  This found the breast density to be category C.  There was a small lobulated mass in the lower outer aspect of the left breast, which was not palpable.  Ultrasound showed a small lobulated cyst in that area measuring 0.6 cm.  This was felt to be benign.  In July 2019 she had screening mammography at Dr. Clementeen Hoof office.  I do not have that report but according to the patient it was unremarkable.  In October 2019 however the patient developed left axillary swelling and tenderness and was again set up for left mammography with tomography and left breast ultrasonography at the [facility],  03/11/2018.  Breast density was category C.  Mammography showed numerous markedly enlarged left axillary lymph nodes.  There was periareolar skin thickening.  There was an area of ill-defined distortion in the upper outer left breast and on physical exam there was a firm lump superior and lateral to the left nipple, with swelling of the left axilla.  Ultrasound of the left breast and both upper quadrants found an ill-defined irregular hypoechoic area measuring at least 4.2 cm.  There also multiple scattered cysts.  Ultrasound of the left axilla showed at least 6 abnormal lymph nodes.  On 03/16/2018 the patient underwent right mammography with tomography and right breast ultrasonography at the [facility].  Again breast density was category C.  There were multiple circumscribed equal density masses in the upper outer quadrant of the right breast which appeared stable.  Ultrasound showed diffuse fibrocystic changes.  An additional hypoechoic mass was noted at the 9 o'clock position 3 cm from the nipple measuring 0.8 cm, with no associated vascularity.  A complex solid and cystic mass was noted superficially at the 6 o'clock position 3 cm from the nipple measuring 1.6 cm.  Evaluation of the right axilla was negative.   On 03/18/2018 the patient underwent biopsy of the 2 suspicious areas in the right breast, and they both showed only fibrocystic changes with some chronic inflammation ([id number]).  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  The patient's subsequent history is as detailed below.   PAST MEDICAL  HISTORY: Past Medical History:  Diagnosis Date   Anemia    had iron infusion 08/12/15   Asthma    Breast cancer ([place]) 03/2018   Left Breast Cancer   Cancer ([place])    Breast  cancer    Genital herpes    GERD (gastroesophageal reflux disease)    Headache    migraines 2-3 times monthly   Pneumonia    hospitalizied for pneumonia as a child    PAST SURGICAL HISTORY: Past Surgical History:  Procedure Laterality Date   ABDOMINAL HYSTERECTOMY Bilateral 08/22/2015   Procedure: HYSTERECTOMY ABDOMINAL, BILATERAL SALPINGECTOMY;  Surgeon: Everlene Farrier, MD;  Location: [facility];  Service: Gynecology;  Laterality: Bilateral;   MASTECTOMY Left 07/2018   MASTECTOMY WITH AXILLARY LYMPH NODE DISSECTION Left 08/30/2018   Procedure: Left mastectomy with axillary lymph node dissection;  Surgeon: Rolm Bookbinder, MD;  Location: [facility];  Service: General;  Laterality: Left;   MYOMECTOMY ABDOMINAL APPROACH     PORTACATH PLACEMENT N/A 03/30/2018   Procedure: INSERTION PORT-A-CATH WITH [place];  Surgeon: Rolm Bookbinder, MD;  Location: [facility];  Service: General;  Laterality: N/A;    FAMILY HISTORY: Family History  Problem Relation Age of Onset   Hypertension Other    Diabetes Cousin        mat first cousin   Cancer Other    Dementia Maternal Grandfather    Lung cancer Paternal Grandmother        d. 80-60   Brain cancer Paternal Aunt        d. 52, father's mat 1/2 sister   Diabetes Maternal Grandmother    Other Paternal Grandfather        d. WWII   Aneurysm Maternal Uncle        brain   Migraines Neg Hx   The patient's parents are in their early 86s as of October 2019.  A paternal aunt had brain cancer at age 51.  A paternal grandmother had lung cancer at age 66.  There is no breast or ovarian cancer history in the family.  However note that the patient has little information on her mother side of the family.   GYNECOLOGIC HISTORY:  No LMP recorded. Patient has had a hysterectomy. 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  Her husband [name]. works for the [employer].  He is a former patient here, with stage IV colon cancer.  They live alone, with no pets.     ADVANCED DIRECTIVES: Not in place   HEALTH MAINTENANCE: Social History   Tobacco Use   Smoking status: Never   Smokeless tobacco: Never  Vaping Use   Vaping Use: Never used  Substance Use Topics   Alcohol use: Yes    Alcohol/week: 1.0 standard drink    Types: 1 Glasses of wine per week    Comment: daily   Drug use: No     Colonoscopy: Never  PAP: Status post hysterectomy  Bone density: Never   Allergies  Allergen Reactions   Neosporin [Neomycin-Bacitracin Zn-Polymyx] Other (See Comments)    Decreased wound healing with blisters    Current Outpatient Medications  Medication Sig Dispense Refill   acetaminophen (TYLENOL) 500 MG tablet Take 1,000 mg  by mouth every 8 (eight) hours as needed for moderate pain. (Patient not taking: Reported on 02/24/2021)     albuterol (PROVENTIL HFA;VENTOLIN HFA) 108 (90 Base) MCG/ACT inhaler Inhale 2 puffs into the lungs every 6 (six) hours as needed for wheezing or shortness of breath.      cimetidine (TAGAMET) 400 MG tablet Take 400 mg by mouth 3 (three) times daily.     EPINEPHrine 0.3 mg/0.3 mL IJ SOAJ injection Inject 0.3 mg into the muscle once.     hydrOXYzine (VISTARIL) 25 MG capsule Take 25-50 mg by mouth at bedtime.     loratadine (CLARITIN) 10 MG tablet Take 1 tablet twice daily for hives. Can increase to maximum dose of 2 tablets twice daily if needed. Do not combine with other antihistamines. 30 tablet 5   montelukast (SINGULAIR) 10 MG tablet Take 10 mg by mouth every evening.     pantoprazole (PROTONIX) 40 MG tablet Take 40 mg by mouth every evening.     SYMBICORT 160-4.5 MCG/ACT inhaler INL 2 PFS PO BID  3   valACYclovir (VALTREX) 500 MG  tablet Take 500 mg by mouth See admin instructions. Take 500 mg twice daily for 3 days at onset of outbreak then take 500 mg once daily until clear     No current facility-administered medications for this visit.    OBJECTIVE: African-American woman who appears younger than stated age There were no vitals filed for this visit.    There is no height or weight on file to calculate BMI.   Wt Readings from Last 3 Encounters:  01/13/21 239 lb (108.4 kg)  10/03/20 242 lb 3.2 oz (109.9 kg)  04/08/20 245 lb 9.6 oz (111.4 kg)  ECOG FS: 1 - Symptomatic but completely ambulatory   LAB RESULTS:  CMP     Component Value Date/Time   NA 142 10/03/2020 0817   K 3.9 10/03/2020 0817   CL 107 10/03/2020 0817   CO2 28 10/03/2020 0817   GLUCOSE 92 10/03/2020 0817   BUN 10 10/03/2020 0817   CREATININE 0.74 10/03/2020 0817   CREATININE 0.67 08/11/2018 0900   CALCIUM 9.1 10/03/2020 0817   PROT 6.7 10/03/2020 0817   ALBUMIN 3.7 10/03/2020 0817   AST 13 (L) 10/03/2020 0817   AST 13 (L) 08/11/2018 0900   ALT 8 10/03/2020 0817   ALT 13 08/11/2018 0900   ALKPHOS 72 10/03/2020 0817   BILITOT 1.1 10/03/2020 0817   BILITOT 0.7 08/11/2018 0900   GFRNONAA >60 10/03/2020 0817   GFRNONAA >60 08/11/2018 0900   GFRAA >60 10/04/2019 1508   GFRAA >60 08/11/2018 0900    No results found for: Ronnald Ramp, A1GS, A2GS, BETS, BETA2SER, GAMS, MSPIKE, SPEI  No results found for: Nils Pyle, [facility]  Lab Results  Component Value Date   WBC 4.2 10/03/2020   NEUTROABS 1.9 10/03/2020   HGB 14.3 10/03/2020   HCT 43.8 10/03/2020   MCV 95.6 10/03/2020   PLT 164 10/03/2020      Chemistry      Component Value Date/Time   NA 142 10/03/2020 0817   K 3.9 10/03/2020 0817   CL 107 10/03/2020 0817   CO2 28 10/03/2020 0817   BUN 10 10/03/2020 0817   CREATININE 0.74 10/03/2020 0817   CREATININE 0.67 08/11/2018 0900      Component Value Date/Time   CALCIUM 9.1 10/03/2020 0817    ALKPHOS 72 10/03/2020 0817   AST 13 (L) 10/03/2020 0817   AST 13 (L)  08/11/2018 0900   ALT 8 10/03/2020 0817   ALT 13 08/11/2018 0900   BILITOT 1.1 10/03/2020 0817   BILITOT 0.7 08/11/2018 0900       No results found for: LABCA2  No components found for: [id number]  No results for input(s): INR in the last 168 hours.  No results found for: LABCA2  No results found for: LYY503  No results found for: TWS568  No results found for: LEX517  Lab Results  Component Value Date   CA2729 213.7 (H) 04/07/2018    No components found for: HGQUANT  No results found for: CEA1 / No results found for: CEA1   No results found for: AFPTUMOR  No results found for: CHROMOGRNA  No results found for: HGBA, HGBA2QUANT, HGBFQUANT, HGBSQUAN (Hemoglobinopathy evaluation)   No results found for: LDH  No results found for: IRON, TIBC, IRONPCTSAT (Iron and TIBC)  No results found for: FERRITIN  Urinalysis No results found for: COLORURINE, APPEARANCEUR, LABSPEC, PHURINE, GLUCOSEU, HGBUR, BILIRUBINUR, KETONESUR, PROTEINUR, UROBILINOGEN, NITRITE, LEUKOCYTESUR   STUDIES: No results found.   ELIGIBLE FOR AVAILABLE RESEARCH PROTOCOL:  no   ASSESSMENT: 50 y.o. [place] woman status post left breast overlapping site biopsy and left axillary lymph node biopsy 03/16/2018 for a clinical T2 N3, stage IIIC invasive ductal carcinoma, triple negative, with an MIB-1 of 15%   (1) Staging studies  (a) CT chest on 04/05/2018: left axillary and chronic left retropectoral adenopathy, supraclavicular adenopathy, left breast skin thickening and mass, 39m left upper lobe pulmonary nodule (repeat ct chest in 3-6 months recommended), bronchiectasis in left lower lobe.  (b) CT neck on 04/05/2018: left supraclavicular adenopathy, ? Abnormality in left frontal lobe of brain  (c) Bone scan on 04/05/2018: no findings suggestive of metastatic disease to bone  (d) MRI brain 04/07/2018: negative  (2) neoadjuvant  chemotherapy consisting of doxorubicin and cyclophosphamide in dose dense fashion x4 started 04/07/2018, completed 05/19/2018,  followed by weekly carboplatin and paclitaxel x10 started 06/02/2018, last dose 08/04/2018 (stopped two cycles early due to peripheral neuropathy)  (3) genetics testing 11/18: no deleterious mutations.  Genes tested include: AIP, ALK, APC, ATM, AXIN2,BAP1,  BARD1, BLM, BMPR1A, BRCA1, BRCA2, BRIP1, CASR, CDC73, CDH1, CDK4, CDKN1B, CDKN1C, CDKN2A (p14ARF), CDKN2A (p16INK4a), CEBPA, CHEK2, CTNNA1, DICER1, DIS3L2, EGFR (c.2369C>T, p.Thr790Met variant only), EPCAM (Deletion/duplication testing only), FH, FLCN, GATA2, GPC3, GREM1 (Promoter region deletion/duplication testing only), HOXB13 (c.251G>A, p.Gly84Glu), HRAS, KIT, MAX, MEN1, MET, MITF (c.952G>A, p.Glu318Lys variant only), MLH1, MSH2, MSH3, MSH6, MUTYH, NBN, NF1, NF2, NTHL1, PALB2, PDGFRA, PHOX2B, PMS2, POLD1, POLE, POT1, PRKAR1A, PTCH1, PTEN, RAD50, RAD51C, RAD51D, RB1, RECQL4, RET, RUNX1, SDHAF2, SDHA (sequence changes only), SDHB, SDHC, SDHD, SMAD4, SMARCA4, SMARCB1, SMARCE1, STK11, SUFU, TERC, TERT, TMEM127, TP53, TSC1, TSC2, VHL, WRN and WT1.  (4) left mastectomy and axillary lymph node dissection 08/30/2018 showed a residual ypT1a ypN0 invasive ductal carcinoma, grade 2, with negative margins.  (a) 12 axillary nodes were removed  (b) repeat prognostic profile again triple negative, with an MIB-1 of 30%  (5) adjuvant radiation completed 11/22/2018  (6) Caris panel 10/31/2018 found insufficient tissue for PDL 1 testing, MSI was stable, mismatch repair indeterminate, no PI K3 mutation, androgen receptor insufficient tumor   PLAN: MCalindid not show for her appointment on 02/24/2021.  She also did not show for her appointment 09/05/2020.  A follow-up letter has been sent   GSarajane JewsC. Jhoselin Crume, MD 02/24/21 4:35 PM Medical Oncology and Hematology [facility]  [place] Tel.  [phone]    Fax. [phone]   I, Wilburn Mylar, am acting as scribe for Dr. Virgie Dad. Saloma Cadena.  I, Lurline Del MD, have reviewed the above documentation for accuracy and completeness, and I agree with the above.    *Total Encounter Time as defined by the Centers for Medicare and Medicaid Services includes, in addition to the face-to-face time of a patient visit (documented in the note above) non-face-to-face time: obtaining and reviewing outside history, ordering and reviewing medications, tests or procedures, care coordination (communications with other health care professionals or caregivers) and documentation in the medical record.

## 2021-02-24 ENCOUNTER — Encounter: Payer: Self-pay | Admitting: Oncology

## 2021-02-24 ENCOUNTER — Ambulatory Visit: Payer: BC Managed Care – PPO | Admitting: Internal Medicine

## 2021-02-24 ENCOUNTER — Encounter: Payer: Self-pay | Admitting: Internal Medicine

## 2021-02-24 ENCOUNTER — Inpatient Hospital Stay (HOSPITAL_BASED_OUTPATIENT_CLINIC_OR_DEPARTMENT_OTHER): Payer: BC Managed Care – PPO | Admitting: Oncology

## 2021-02-24 ENCOUNTER — Other Ambulatory Visit: Payer: Self-pay

## 2021-02-24 ENCOUNTER — Inpatient Hospital Stay: Payer: BC Managed Care – PPO | Attending: Oncology

## 2021-02-24 VITALS — BP 114/74 | HR 88 | Temp 97.7°F | Resp 12

## 2021-02-24 DIAGNOSIS — L501 Idiopathic urticaria: Secondary | ICD-10-CM | POA: Diagnosis not present

## 2021-02-24 DIAGNOSIS — J3089 Other allergic rhinitis: Secondary | ICD-10-CM

## 2021-02-24 DIAGNOSIS — J302 Other seasonal allergic rhinitis: Secondary | ICD-10-CM | POA: Diagnosis not present

## 2021-02-24 DIAGNOSIS — C50412 Malignant neoplasm of upper-outer quadrant of left female breast: Secondary | ICD-10-CM

## 2021-02-24 DIAGNOSIS — J4540 Moderate persistent asthma, uncomplicated: Secondary | ICD-10-CM | POA: Diagnosis not present

## 2021-02-24 DIAGNOSIS — J310 Chronic rhinitis: Secondary | ICD-10-CM | POA: Diagnosis not present

## 2021-02-24 DIAGNOSIS — Z171 Estrogen receptor negative status [ER-]: Secondary | ICD-10-CM

## 2021-02-24 MED ORDER — MONTELUKAST SODIUM 10 MG PO TABS: 10.0000 mg | ORAL_TABLET | Freq: Every evening | ORAL | 5 refills | Status: DC

## 2021-02-24 MED ORDER — LORATADINE 10 MG PO TABS: ORAL_TABLET | ORAL | 5 refills | Status: DC

## 2021-04-10 ENCOUNTER — Other Ambulatory Visit: Payer: Self-pay | Admitting: Obstetrics and Gynecology

## 2021-04-10 DIAGNOSIS — R928 Other abnormal and inconclusive findings on diagnostic imaging of breast: Secondary | ICD-10-CM

## 2021-04-22 ENCOUNTER — Telehealth: Payer: Self-pay | Admitting: Oncology

## 2021-04-22 NOTE — Telephone Encounter (Signed)
Scheduled per sch msg. Called and left msg  

## 2021-04-25 ENCOUNTER — Telehealth: Payer: Self-pay | Admitting: Adult Health

## 2021-04-25 ENCOUNTER — Other Ambulatory Visit: Payer: Self-pay | Admitting: *Deleted

## 2021-04-25 DIAGNOSIS — C50412 Malignant neoplasm of upper-outer quadrant of left female breast: Secondary | ICD-10-CM

## 2021-04-25 DIAGNOSIS — Z171 Estrogen receptor negative status [ER-]: Secondary | ICD-10-CM

## 2021-04-25 NOTE — Telephone Encounter (Signed)
Scheduled per sch msg. Called and spoke with patient. Confirmed appt  

## 2021-04-28 ENCOUNTER — Inpatient Hospital Stay: Payer: BC Managed Care – PPO | Admitting: Adult Health

## 2021-04-28 ENCOUNTER — Inpatient Hospital Stay: Payer: BC Managed Care – PPO

## 2021-05-07 ENCOUNTER — Other Ambulatory Visit: Payer: Self-pay

## 2021-05-07 ENCOUNTER — Telehealth: Payer: Self-pay | Admitting: Adult Health

## 2021-05-07 ENCOUNTER — Inpatient Hospital Stay: Payer: BC Managed Care – PPO | Attending: Oncology

## 2021-05-07 ENCOUNTER — Encounter: Payer: Self-pay | Admitting: Adult Health

## 2021-05-07 ENCOUNTER — Inpatient Hospital Stay: Payer: BC Managed Care – PPO | Admitting: Adult Health

## 2021-05-07 VITALS — BP 119/74 | HR 100 | Temp 97.8°F | Resp 19 | Ht 69.0 in | Wt 233.8 lb

## 2021-05-07 DIAGNOSIS — Z801 Family history of malignant neoplasm of trachea, bronchus and lung: Secondary | ICD-10-CM | POA: Diagnosis not present

## 2021-05-07 DIAGNOSIS — C50412 Malignant neoplasm of upper-outer quadrant of left female breast: Secondary | ICD-10-CM

## 2021-05-07 DIAGNOSIS — Z90710 Acquired absence of both cervix and uterus: Secondary | ICD-10-CM | POA: Diagnosis not present

## 2021-05-07 DIAGNOSIS — C50812 Malignant neoplasm of overlapping sites of left female breast: Secondary | ICD-10-CM | POA: Diagnosis present

## 2021-05-07 DIAGNOSIS — Z171 Estrogen receptor negative status [ER-]: Secondary | ICD-10-CM | POA: Insufficient documentation

## 2021-05-07 DIAGNOSIS — Z808 Family history of malignant neoplasm of other organs or systems: Secondary | ICD-10-CM | POA: Diagnosis not present

## 2021-05-07 LAB — CMP (CANCER CENTER ONLY)
ALT: 11 U/L (ref 0–44)
AST: 14 U/L — ABNORMAL LOW (ref 15–41)
Albumin: 4.1 g/dL (ref 3.5–5.0)
Alkaline Phosphatase: 68 U/L (ref 38–126)
Anion gap: 13 (ref 5–15)
BUN: 14 mg/dL (ref 6–20)
CO2: 22 mmol/L (ref 22–32)
Calcium: 9.5 mg/dL (ref 8.9–10.3)
Chloride: 110 mmol/L (ref 98–111)
Creatinine: 0.89 mg/dL (ref 0.44–1.00)
GFR, Estimated: >60 mL/min (ref >60)
Glucose, Bld: 88 mg/dL (ref 70–99)
Potassium: 4.7 mmol/L (ref 3.5–5.1)
Sodium: 145 mmol/L (ref 135–145)
Total Bilirubin: 1.2 mg/dL (ref 0.3–1.2)
Total Protein: 7.3 g/dL (ref 6.5–8.1)

## 2021-05-07 LAB — CBC WITH DIFFERENTIAL (CANCER CENTER ONLY)
Abs Immature Granulocytes: 0.01 10*3/uL (ref 0.00–0.07)
Basophils Absolute: 0 10*3/uL (ref 0.0–0.1)
Basophils Relative: 1 %
Eosinophils Absolute: 0 10*3/uL (ref 0.0–0.5)
Eosinophils Relative: 1 %
HCT: 44.8 % (ref 36.0–46.0)
Hemoglobin: 14.4 g/dL (ref 12.0–15.0)
Immature Granulocytes: 0 %
Lymphocytes Relative: 34 %
Lymphs Abs: 1.8 10*3/uL (ref 0.7–4.0)
MCH: 31 pg (ref 26.0–34.0)
MCHC: 32.1 g/dL (ref 30.0–36.0)
MCV: 96.6 fL (ref 80.0–100.0)
Monocytes Absolute: 0.5 10*3/uL (ref 0.1–1.0)
Monocytes Relative: 9 %
Neutro Abs: 3.1 10*3/uL (ref 1.7–7.7)
Neutrophils Relative %: 55 %
Platelet Count: 218 10*3/uL (ref 150–400)
RBC: 4.64 MIL/uL (ref 3.87–5.11)
RDW: 12.6 % (ref 11.5–15.5)
WBC Count: 5.4 10*3/uL (ref 4.0–10.5)
nRBC: 0 % (ref 0.0–0.2)

## 2021-05-07 NOTE — Telephone Encounter (Signed)
Scheduled appointment per 12/7 los. Patient is aware. 

## 2021-05-07 NOTE — Progress Notes (Signed)
[place]  Telephone:(336) [phone] Fax:(336) [phone]    ID: Paula Berry DOB: 1970-07-13  MR#: [id number]  JYN#:[id number]  Patient Care Team: Vernie Shanks, MD as PCP - General (Family Medicine) Rolm Bookbinder, MD as Consulting Physician (General Surgery) Magrinat, Virgie Dad, MD as Consulting Physician (Oncology) Eppie Gibson, MD as Attending Physician (Radiation Oncology) Everlene Farrier, MD as Consulting Physician (Obstetrics and Gynecology) Everlean Alstrom, MD as Attending Physician (Radiology) Irene Limbo, MD as Consulting Physician (Plastic Surgery) OTHER MD:   CHIEF COMPLAINT: Triple negative breast cancer (s/p left mastectomy)  CURRENT TREATMENT: Observation  INTERVAL HISTORY: Paula Berry is here today for continued observation of her history of triple negative breast cancer.    Her most recent mammogram of her right breast was completed in December 2021 which was normal and showed breast density category B.  She is scheduled tomorrow for a diagnostic right breast mammogram and ultrasound tomorrow.  Paula Berry has reviewed her screening mammogram with her gynecologist and subsequently was recommended to have a diagnostic mammogram and ultrasound at the breast center.  Please note this is the breast that did not have cancer, her left breast had a triple negative cancer and she is status post left mastectomy.  Of note she did undergo CT chest on September 26, 2019 which showed radiation fibrosis in the left upper lobe which was expected.  No evidence of metastatic disease in the chest.  Underlying postinfectious bronchiectasis, and surgical changes from her left mastectomy.Paula Berry  REVIEW OF SYSTEMS: Review of Systems  Constitutional:  Negative for appetite change, chills, fatigue, fever and unexpected weight change.  HENT:   Negative for hearing loss, lump/mass and trouble swallowing.   Eyes:  Negative for eye problems and icterus.  Respiratory:   Negative for chest tightness, cough and shortness of breath.   Cardiovascular:  Negative for chest pain, leg swelling and palpitations.  Gastrointestinal:  Negative for abdominal distention, abdominal pain, constipation, diarrhea, nausea and vomiting.  Endocrine: Negative for hot flashes.  Genitourinary:  Negative for difficulty urinating.   Musculoskeletal:  Negative for arthralgias.  Skin:  Negative for itching and rash.  Neurological:  Negative for dizziness, extremity weakness, headaches and numbness.  Hematological:  Negative for adenopathy. Does not bruise/bleed easily.  Psychiatric/Behavioral:  Negative for depression. The patient is not nervous/anxious.    COVID 19 VACCINATION STATUS: fully vaccinated [employer]) with booster early October 2021   HISTORY OF CURRENT ILLNESS: From the original intake note:  The patient had a left mammogram with tomography and ultrasonography 07/12/2015 at the [facility] after screening recall for a possible left breast mass.  This found the breast density to be category C.  There was a small lobulated mass in the lower outer aspect of the left breast, which was not palpable.  Ultrasound showed a small lobulated cyst in that area measuring 0.6 cm.  This was felt to be benign.  In July 2019 she had screening mammography at Dr. Clementeen Hoof office.  I do not have that report but according to the patient it was unremarkable.  In October 2019 however the patient developed left axillary swelling and tenderness and was again set up for left mammography with tomography and left breast ultrasonography at the [facility], 03/11/2018.  Breast density was category C.  Mammography showed numerous markedly enlarged left axillary lymph nodes.  There was periareolar skin thickening.  There was an area of ill-defined distortion in the upper outer left breast and on physical exam there was  a firm lump superior and lateral to the left nipple, with swelling of the left axilla.   Ultrasound of the left breast and both upper quadrants found an ill-defined irregular hypoechoic area measuring at least 4.2 cm.  There also multiple scattered cysts.  Ultrasound of the left axilla showed at least 6 abnormal lymph nodes.  On 03/16/2018 the patient underwent right mammography with tomography and right breast ultrasonography at the [facility].  Again breast density was category C.  There were multiple circumscribed equal density masses in the upper outer quadrant of the right breast which appeared stable.  Ultrasound showed diffuse fibrocystic changes.  An additional hypoechoic mass was noted at the 9 o'clock position 3 cm from the nipple measuring 0.8 cm, with no associated vascularity.  A complex solid and cystic mass was noted superficially at the 6 o'clock position 3 cm from the nipple measuring 1.6 cm.  Evaluation of the right axilla was negative.   On 03/18/2018 the patient underwent biopsy of the 2 suspicious areas in the right breast, and they both showed only fibrocystic changes with some chronic inflammation ([id number]).  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  The patient's subsequent history is as detailed below.   PAST MEDICAL HISTORY: Past Medical History:  Diagnosis Date   Anemia    had iron infusion 08/12/15   Asthma    Breast cancer ([place]) 03/2018   Left Breast Cancer   Cancer ([place])    Breast cancer    Genital herpes    GERD (gastroesophageal reflux disease)    Headache    migraines 2-3 times monthly   Pneumonia    hospitalizied for pneumonia as a child    PAST SURGICAL HISTORY: Past Surgical History:  Procedure Laterality Date   ABDOMINAL HYSTERECTOMY Bilateral 08/22/2015   Procedure: HYSTERECTOMY ABDOMINAL, BILATERAL  SALPINGECTOMY;  Surgeon: Everlene Farrier, MD;  Location: [facility];  Service: Gynecology;  Laterality: Bilateral;   MASTECTOMY Left 07/2018   MASTECTOMY WITH AXILLARY LYMPH NODE DISSECTION Left 08/30/2018   Procedure: Left mastectomy with axillary lymph node dissection;  Surgeon: Rolm Bookbinder, MD;  Location: [facility];  Service: General;  Laterality: Left;   MYOMECTOMY ABDOMINAL APPROACH     PORTACATH PLACEMENT N/A 03/30/2018   Procedure: INSERTION PORT-A-CATH WITH [place];  Surgeon: Rolm Bookbinder, MD;  Location: [facility];  Service: General;  Laterality: N/A;    FAMILY HISTORY: Family History  Problem Relation Age of Onset   Hypertension Other    Diabetes Cousin        mat first cousin   Cancer Other    Dementia Maternal Grandfather    Lung cancer Paternal Grandmother        d. 7-60   Brain cancer Paternal Aunt        d. 48, father's mat 1/2 sister   Diabetes Maternal Grandmother    Other Paternal Grandfather        d. WWII   Aneurysm Maternal Uncle        brain   Migraines Neg Hx   The patient's parents are in their early 41s as of October 2019.  A paternal aunt had brain cancer at age 20.  A paternal grandmother had lung cancer at age 1.  There is no breast or ovarian cancer  history in the family.  However note that the patient has little information on her mother side of the family.   GYNECOLOGIC HISTORY:  No LMP recorded. Patient has had a hysterectomy. 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  Her husband [name]. works for the [employer].  He is a former patient here, with stage IV colon cancer.  They live alone, with no pets.     ADVANCED DIRECTIVES: Not in place   HEALTH  MAINTENANCE: Social History   Tobacco Use   Smoking status: Never   Smokeless tobacco: Never  Vaping Use   Vaping Use: Never used  Substance Use Topics   Alcohol use: Yes    Alcohol/week: 1.0 standard drink    Types: 1 Glasses of wine per week    Comment: daily   Drug use: No     Colonoscopy: Never  PAP: Status post hysterectomy  Bone density: Never   Allergies  Allergen Reactions   Neosporin [Neomycin-Bacitracin Zn-Polymyx] Other (See Comments)    Decreased wound healing with blisters    Current Outpatient Medications  Medication Sig Dispense Refill   acetaminophen (TYLENOL) 500 MG tablet Take 1,000 mg by mouth every 8 (eight) hours as needed for moderate pain. (Patient not taking: Reported on 02/24/2021)     albuterol (PROVENTIL HFA;VENTOLIN HFA) 108 (90 Base) MCG/ACT inhaler Inhale 2 puffs into the lungs every 6 (six) hours as needed for wheezing or shortness of breath.      cimetidine (TAGAMET) 400 MG tablet Take 400 mg by mouth 3 (three) times daily.     EPINEPHrine 0.3 mg/0.3 mL IJ SOAJ injection Inject 0.3 mg into the muscle once.     hydrOXYzine (VISTARIL) 25 MG capsule Take 25-50 mg by mouth at bedtime.     loratadine (CLARITIN) 10 MG tablet Take 1 tablet twice daily for hives. Can increase to maximum dose of 2 tablets twice daily if needed. 120 tablet 5   montelukast (SINGULAIR) 10 MG tablet Take 1 tablet (10 mg total) by mouth every evening. 30 tablet 5   pantoprazole (PROTONIX) 40 MG tablet Take 40 mg by mouth every evening.     SYMBICORT 160-4.5 MCG/ACT inhaler INL 2 PFS PO BID  3   valACYclovir (VALTREX) 500 MG tablet Take 500 mg by mouth See admin instructions. Take 500 mg twice daily for 3 days at onset of outbreak then take 500 mg once daily until clear     No current facility-administered medications for this visit.    OBJECTIVE:  Vitals:   05/07/21 1034  BP: 119/74  Pulse: 100  Resp: 19  Temp: 97.8 F (36.6 C)  SpO2: 100%      Body mass index is  34.53 kg/m.   Wt Readings from Last 3 Encounters:  05/07/21 233 lb 12.8 oz (106.1 kg)  01/13/21 239 lb (108.4 kg)  10/03/20 242 lb 3.2 oz (109.9 kg)  ECOG FS: 1 - Symptomatic but completely ambulatory GENERAL: Patient is a well appearing female in no acute distress HEENT:  Sclerae anicteric.  Oropharynx clear and moist. No ulcerations or evidence of oropharyngeal candidiasis. Neck is supple.  NODES:  No cervical, supraclavicular, or axillary lymphadenopathy palpated.  BREAST EXAM: Left breast is  status postmastectomy and radiation no sign of local recurrence right breast is benign LUNGS:  Clear to auscultation bilaterally.  No wheezes or rhonchi. HEART:  Regular rate and rhythm. No murmur appreciated. ABDOMEN:  Soft, nontender.  Positive, normoactive bowel sounds. No organomegaly palpated. MSK:  No focal spinal tenderness to palpation. Full range of motion bilaterally in the upper extremities. EXTREMITIES:  No peripheral edema.   SKIN:  Clear with no obvious rashes or skin changes. No nail dyscrasia. NEURO:  Nonfocal. Well oriented.  Appropriate affect.   LAB RESULTS:  CMP     Component Value Date/Time   NA 142 10/03/2020 0817   K 3.9 10/03/2020 0817   CL 107 10/03/2020 0817   CO2 28 10/03/2020 0817   GLUCOSE 92 10/03/2020 0817   BUN 10 10/03/2020 0817   CREATININE 0.74 10/03/2020 0817   CREATININE 0.67 08/11/2018 0900   CALCIUM 9.1 10/03/2020 0817   PROT 6.7 10/03/2020 0817   ALBUMIN 3.7 10/03/2020 0817   AST 13 (L) 10/03/2020 0817   AST 13 (L) 08/11/2018 0900   ALT 8 10/03/2020 0817   ALT 13 08/11/2018 0900   ALKPHOS 72 10/03/2020 0817   BILITOT 1.1 10/03/2020 0817   BILITOT 0.7 08/11/2018 0900   GFRNONAA >60 10/03/2020 0817   GFRNONAA >60 08/11/2018 0900   GFRAA >60 10/04/2019 1508   GFRAA >60 08/11/2018 0900    No results found for: Ronnald Ramp, A1GS, A2GS, BETS, BETA2SER, GAMS, MSPIKE, SPEI  No results found for: Nils Pyle,  [facility]  Lab Results  Component Value Date   WBC 5.4 05/07/2021   NEUTROABS 3.1 05/07/2021   HGB 14.4 05/07/2021   HCT 44.8 05/07/2021   MCV 96.6 05/07/2021   PLT 218 05/07/2021      Chemistry      Component Value Date/Time   NA 142 10/03/2020 0817   K 3.9 10/03/2020 0817   CL 107 10/03/2020 0817   CO2 28 10/03/2020 0817   BUN 10 10/03/2020 0817   CREATININE 0.74 10/03/2020 0817   CREATININE 0.67 08/11/2018 0900      Component Value Date/Time   CALCIUM 9.1 10/03/2020 0817   ALKPHOS 72 10/03/2020 0817   AST 13 (L) 10/03/2020 0817   AST 13 (L) 08/11/2018 0900   ALT 8 10/03/2020 0817   ALT 13 08/11/2018 0900   BILITOT 1.1 10/03/2020 0817   BILITOT 0.7 08/11/2018 0900       No results found for: LABCA2  No components found for: [id number]  No results for input(s): INR in the last 168 hours.  No results found for: LABCA2  No results found for: KGY185  No results found for: UDJ497  No results found for: WYO378  Lab Results  Component Value Date   CA2729 213.7 (H) 04/07/2018    No components found for: HGQUANT  No results found for: CEA1 / No results found for: CEA1   No results found for: AFPTUMOR  No results found for: CHROMOGRNA  No results found for: HGBA, HGBA2QUANT, HGBFQUANT, HGBSQUAN (Hemoglobinopathy evaluation)   No results found for: LDH  No results found for: IRON, TIBC, IRONPCTSAT (Iron and TIBC)  No results found for: FERRITIN  Urinalysis No results found for: COLORURINE, APPEARANCEUR, LABSPEC, PHURINE, GLUCOSEU, HGBUR, BILIRUBINUR, KETONESUR, PROTEINUR, UROBILINOGEN, NITRITE, LEUKOCYTESUR   STUDIES: No results found.   ELIGIBLE FOR AVAILABLE RESEARCH PROTOCOL:  no   ASSESSMENT: 50 y.o. [place] woman status post left breast overlapping site biopsy and left axillary lymph node biopsy  03/16/2018 for a clinical T2 N3, stage IIIC invasive ductal carcinoma, triple negative, with an MIB-1 of 15%   (1) Staging studies  (a)  CT chest on 04/05/2018: left axillary and chronic left retropectoral adenopathy, supraclavicular adenopathy, left breast skin thickening and mass, 71m left upper lobe pulmonary nodule (repeat ct chest in 3-6 months recommended), bronchiectasis in left lower lobe.  (b) CT neck on 04/05/2018: left supraclavicular adenopathy, ? Abnormality in left frontal lobe of brain  (c) Bone scan on 04/05/2018: no findings suggestive of metastatic disease to bone  (d) MRI brain 04/07/2018: negative  (2) neoadjuvant chemotherapy consisting of doxorubicin and cyclophosphamide in dose dense fashion x4 started 04/07/2018, completed 05/19/2018,  followed by weekly carboplatin and paclitaxel x10 started 06/02/2018, last dose 08/04/2018 (stopped two cycles early due to peripheral neuropathy)  (3) genetics testing 11/18: no deleterious mutations.  Genes tested include: AIP, ALK, APC, ATM, AXIN2,BAP1,  BARD1, BLM, BMPR1A, BRCA1, BRCA2, BRIP1, CASR, CDC73, CDH1, CDK4, CDKN1B, CDKN1C, CDKN2A (p14ARF), CDKN2A (p16INK4a), CEBPA, CHEK2, CTNNA1, DICER1, DIS3L2, EGFR (c.2369C>T, p.Thr790Met variant only), EPCAM (Deletion/duplication testing only), FH, FLCN, GATA2, GPC3, GREM1 (Promoter region deletion/duplication testing only), HOXB13 (c.251G>A, p.Gly84Glu), HRAS, KIT, MAX, MEN1, MET, MITF (c.952G>A, p.Glu318Lys variant only), MLH1, MSH2, MSH3, MSH6, MUTYH, NBN, NF1, NF2, NTHL1, PALB2, PDGFRA, PHOX2B, PMS2, POLD1, POLE, POT1, PRKAR1A, PTCH1, PTEN, RAD50, RAD51C, RAD51D, RB1, RECQL4, RET, RUNX1, SDHAF2, SDHA (sequence changes only), SDHB, SDHC, SDHD, SMAD4, SMARCA4, SMARCB1, SMARCE1, STK11, SUFU, TERC, TERT, TMEM127, TP53, TSC1, TSC2, VHL, WRN and WT1.  (4) left mastectomy and axillary lymph node dissection 08/30/2018 showed a residual ypT1a ypN0 invasive ductal carcinoma, grade 2, with negative margins.  (a) 12 axillary nodes were removed  (b) repeat prognostic profile again triple negative, with an MIB-1 of 30%  (5) adjuvant radiation  completed 11/22/2018  (6) Caris panel 10/31/2018 found insufficient tissue for PDL 1 testing, MSI was stable, mismatch repair indeterminate, no PI K3 mutation, androgen receptor insufficient tumor   PLAN: MElyshiais here today for follow-up and observation of her left-sided triple negative breast cancer.  She completed treatment in June 2020.  She has no clinical signs of breast cancer recurrence.  She is continue to receive right breast mammograms.  Most recently she did this at her gynecologist office instead of the breast center.  She has been recommended for diagnostic mammogram and ultrasound which is scheduled for tomorrow.  She will call me when she knows the results of these, and we can discuss further if she needs any imaging.  We did talk in detail about indications for imaging, and at this point in the absence of symptoms I do not recommend imaging.  However, if she does begin to develop any symptoms she knows to call uKoreaso we can see her and evaluate at that time if imaging is necessary.  MShyanaunderstands this.  She also understands that Dr. MJana Hakimis retiring and is going to see him today at the end of the visit.  She has requested a follow-up with Dr. GPayton Mccallum  I have put in for follow-up in 6 months time.  She knows to call for any questions that may arise between now and her next appointment.  We are happy to see her sooner if needed.  Total encounter time: 30 minutes in face-to-face visit time, chart review, lab review, order entry, care coordination, and documentation of the encounter.   LWilber Bihari NP 05/07/21 11:25 AM [facility]  [place], [place] [address] Tel. [phone]    Fax. [phone]      *Total Encounter Time as defined by the Centers for Medicare and Medicaid Services includes, in addition to the face-to-face time of a patient visit (documented in the note above) non-face-to-face time:  obtaining and reviewing outside history, ordering and reviewing medications, tests or procedures, care coordination (communications with other health care professionals or caregivers) and documentation in the medical record.

## 2021-05-08 ENCOUNTER — Ambulatory Visit
Admission: RE | Admit: 2021-05-08 | Discharge: 2021-05-08 | Disposition: A | Discharge status: Home / Self Care | Payer: BC Managed Care – PPO | Source: Ambulatory Visit | Attending: Obstetrics and Gynecology | Admitting: Obstetrics and Gynecology

## 2021-05-08 ENCOUNTER — Ambulatory Visit: Payer: BC Managed Care – PPO

## 2021-05-08 DIAGNOSIS — R928 Other abnormal and inconclusive findings on diagnostic imaging of breast: Secondary | ICD-10-CM

## 2021-05-08 HISTORY — DX: Personal history of antineoplastic chemotherapy: Z92.21

## 2021-05-08 HISTORY — DX: Personal history of irradiation: Z92.3

## 2021-07-24 ENCOUNTER — Telehealth: Payer: Self-pay | Admitting: Adult Health

## 2021-07-24 NOTE — Telephone Encounter (Signed)
Called patient to update her on the changes made to her upcoming appointment. Left message. Patient will be mailed an updated calendar.

## 2021-07-30 ENCOUNTER — Other Ambulatory Visit: Payer: Self-pay | Admitting: Internal Medicine

## 2021-07-30 DIAGNOSIS — L501 Idiopathic urticaria: Secondary | ICD-10-CM

## 2021-07-30 DIAGNOSIS — J4540 Moderate persistent asthma, uncomplicated: Secondary | ICD-10-CM

## 2021-07-30 DIAGNOSIS — J310 Chronic rhinitis: Secondary | ICD-10-CM

## 2021-08-07 ENCOUNTER — Ambulatory Visit: Payer: BC Managed Care – PPO | Admitting: Adult Health

## 2021-08-07 ENCOUNTER — Other Ambulatory Visit: Payer: BC Managed Care – PPO

## 2021-08-19 ENCOUNTER — Other Ambulatory Visit: Payer: Self-pay | Admitting: *Deleted

## 2021-08-19 DIAGNOSIS — C50412 Malignant neoplasm of upper-outer quadrant of left female breast: Secondary | ICD-10-CM

## 2021-08-21 ENCOUNTER — Encounter: Payer: Self-pay | Admitting: Adult Health

## 2021-08-21 ENCOUNTER — Inpatient Hospital Stay: Payer: BC Managed Care – PPO | Admitting: Adult Health

## 2021-08-21 ENCOUNTER — Other Ambulatory Visit: Payer: Self-pay

## 2021-08-21 ENCOUNTER — Inpatient Hospital Stay: Payer: BC Managed Care – PPO | Attending: Hematology and Oncology

## 2021-08-21 VITALS — BP 134/77 | HR 76 | Temp 97.5°F | Resp 16 | Ht 69.0 in | Wt 235.0 lb

## 2021-08-21 DIAGNOSIS — Z808 Family history of malignant neoplasm of other organs or systems: Secondary | ICD-10-CM | POA: Insufficient documentation

## 2021-08-21 DIAGNOSIS — Z171 Estrogen receptor negative status [ER-]: Secondary | ICD-10-CM

## 2021-08-21 DIAGNOSIS — Z853 Personal history of malignant neoplasm of breast: Secondary | ICD-10-CM | POA: Insufficient documentation

## 2021-08-21 DIAGNOSIS — E669 Obesity, unspecified: Secondary | ICD-10-CM | POA: Insufficient documentation

## 2021-08-21 DIAGNOSIS — C50412 Malignant neoplasm of upper-outer quadrant of left female breast: Secondary | ICD-10-CM

## 2021-08-21 DIAGNOSIS — Z90710 Acquired absence of both cervix and uterus: Secondary | ICD-10-CM | POA: Diagnosis not present

## 2021-08-21 DIAGNOSIS — K219 Gastro-esophageal reflux disease without esophagitis: Secondary | ICD-10-CM | POA: Insufficient documentation

## 2021-08-21 DIAGNOSIS — Z801 Family history of malignant neoplasm of trachea, bronchus and lung: Secondary | ICD-10-CM | POA: Diagnosis not present

## 2021-08-21 LAB — CMP (CANCER CENTER ONLY)
ALT: 11 U/L (ref 0–44)
AST: 13 U/L — ABNORMAL LOW (ref 15–41)
Albumin: 4.2 g/dL (ref 3.5–5.0)
Alkaline Phosphatase: 73 U/L (ref 38–126)
Anion gap: 7 (ref 5–15)
BUN: 17 mg/dL (ref 6–20)
CO2: 25 mmol/L (ref 22–32)
Calcium: 9.6 mg/dL (ref 8.9–10.3)
Chloride: 108 mmol/L (ref 98–111)
Creatinine: 0.64 mg/dL (ref 0.44–1.00)
GFR, Estimated: >60 mL/min (ref >60)
Glucose, Bld: 115 mg/dL — ABNORMAL HIGH (ref 70–99)
Potassium: 3.7 mmol/L (ref 3.5–5.1)
Sodium: 140 mmol/L (ref 135–145)
Total Bilirubin: 1 mg/dL (ref 0.3–1.2)
Total Protein: 6.8 g/dL (ref 6.5–8.1)

## 2021-08-21 LAB — CBC WITH DIFFERENTIAL (CANCER CENTER ONLY)
Abs Immature Granulocytes: 0.01 10*3/uL (ref 0.00–0.07)
Basophils Absolute: 0 10*3/uL (ref 0.0–0.1)
Basophils Relative: 0 %
Eosinophils Absolute: 0.1 10*3/uL (ref 0.0–0.5)
Eosinophils Relative: 2 %
HCT: 42.6 % (ref 36.0–46.0)
Hemoglobin: 14.1 g/dL (ref 12.0–15.0)
Immature Granulocytes: 0 %
Lymphocytes Relative: 37 %
Lymphs Abs: 2.3 10*3/uL (ref 0.7–4.0)
MCH: 31.3 pg (ref 26.0–34.0)
MCHC: 33.1 g/dL (ref 30.0–36.0)
MCV: 94.7 fL (ref 80.0–100.0)
Monocytes Absolute: 0.4 10*3/uL (ref 0.1–1.0)
Monocytes Relative: 7 %
Neutro Abs: 3.4 10*3/uL (ref 1.7–7.7)
Neutrophils Relative %: 54 %
Platelet Count: 200 10*3/uL (ref 150–400)
RBC: 4.5 MIL/uL (ref 3.87–5.11)
RDW: 12.4 % (ref 11.5–15.5)
WBC Count: 6.2 10*3/uL (ref 4.0–10.5)
nRBC: 0 % (ref 0.0–0.2)

## 2021-08-21 NOTE — Progress Notes (Signed)
[facility] up: ?  ? ?Paula Berry, Paula Berry ?[place] [address] ? ? ?DIAGNOSIS:  Cancer Staging  ?Malignant neoplasm of upper-outer quadrant of left breast in female, estrogen receptor negative ([place]) ?Staging form: Breast, AJCC 8th Edition ?- Clinical stage from 03/23/2018: Stage IIIB (cT2, cN1, cM0, G3, ER-, PR-, HER2-) - Unsigned ?Histologic grading system: 3 grade system ?Laterality: Left ?Staged by: Pathologist and managing physician ?Stage used in treatment planning: Yes ?National guidelines used in treatment planning: Yes ?Type of national guideline used in treatment planning: NCCN ?- Pathologic: No Stage Recommended (ypT1a, pN0, cM0, G2, ER-, PR-, HER2-) - Signed by Paula Berry, Paula Berry on 09/28/2018 ?Stage prefix: Post-therapy ?Neoadjuvant therapy: Yes ?Histologic grading system: 3 grade system ? ? ?SUMMARY OF ONCOLOGIC HISTORY: ?Oncology History  ?Malignant neoplasm of upper-outer quadrant of left breast in female, estrogen receptor negative ([place])  ?03/21/2018 Initial Diagnosis  ? Malignant neoplasm of upper-outer quadrant of left breast in female, estrogen receptor negative ([place]) ?  ?04/07/2018 - 08/10/2018 Chemotherapy  ? DOXOrubicin (ADRIAMYCIN) chemo injection 130 mg, 60 mg/m2 = 130 mg, Intravenous,  Once, 4 of 4 cycles. Administration: 130 mg (04/07/2018), 130 mg (04/21/2018), 130 mg (05/05/2018), 130 mg (05/19/2018) ? ?palonosetron (ALOXI) injection 0.25 mg, 0.25 mg, Intravenous,  Once, 14 of 16 cycles. Administration: 0.25 mg (04/07/2018), 0.25 mg (06/02/2018), 0.25 mg (04/21/2018), 0.25 mg (05/05/2018), 0.25 mg (05/19/2018), 0.25 mg (06/09/2018), 0.25 mg (06/16/2018), 0.25 mg (06/23/2018), 0.25 mg (06/30/2018), 0.25 mg (07/07/2018), 0.25 mg (07/14/2018), 0.25 mg (07/21/2018), 0.25 mg (07/28/2018), 0.25 mg (08/04/2018) ? ?pegfilgrastim-cbqv (UDENYCA) injection 6 mg, 6 mg, Subcutaneous, Once, 4 of 4 cycles. Administration: 6 mg (04/09/2018), 6 mg (04/23/2018), 6 mg (05/07/2018), 6  mg (05/21/2018) ? ?CARBOplatin (PARAPLATIN) 300 mg in sodium chloride 0.9 % 250 mL chemo infusion, 300 mg (100 % of original dose 300 mg), Intravenous,  Once, 10 of 12 cycles. Dose modification:   (original dose 300 mg, Cycle 5). Administration: 300 mg (06/02/2018), 300 mg (06/09/2018), 300 mg (06/16/2018), 300 mg (06/23/2018), 300 mg (06/30/2018), 300 mg (07/07/2018), 300 mg (07/14/2018), 300 mg (07/21/2018), 300 mg (07/28/2018), 300 mg (08/04/2018) ? ?cyclophosphamide (CYTOXAN) 1,300 mg in sodium chloride 0.9 % 250 mL chemo infusion, 600 mg/m2 = 1,300 mg, Intravenous,  Once, 4 of 4 cycles. Administration: 1,300 mg (04/07/2018), 1,300 mg (04/21/2018), 1,300 mg (05/05/2018), 1,300 mg (05/19/2018) ? ?PACLitaxel (TAXOL) 174 mg in sodium chloride 0.9 % 250 mL chemo infusion (</= 49m/m2), 80 mg/m2 = 174 mg, Intravenous,  Once, 10 of 12 cycles. Administration: 174 mg (06/02/2018), 174 mg (06/09/2018), 174 mg (06/16/2018), 174 mg (06/23/2018), 174 mg (06/30/2018), 174 mg (07/07/2018), 174 mg (07/14/2018), 174 mg (07/21/2018), 174 mg (07/28/2018), 174 mg (08/04/2018) ? ?fosaprepitant (EMEND) 150 mg ? ?dexamethasone (DECADRON) 12 mg in sodium chloride 0.9 % 145 mL IVPB, , Intravenous,  Once, 14 of 16 cycles. Administration:  (04/07/2018),  (06/02/2018),  (04/21/2018),  (05/05/2018),  (05/19/2018),  (06/09/2018),  (06/16/2018),  (06/23/2018),  (06/30/2018),  (07/07/2018),  (07/14/2018),  (07/21/2018),  (07/28/2018),  (08/04/2018) ? ?  ?04/27/2018 Genetic Testing  ? Negative genetic testing on the multi-cancer panel.  The Multi-Gene Panel offered by [facility] includes sequencing and/or deletion duplication testing of the following 84 genes: AIP, ALK, APC, ATM, AXIN2,BAP1,  BARD1, BLM, BMPR1A, BRCA1, BRCA2, BRIP1, CASR, CDC73, CDH1, CDK4, CDKN1B, CDKN1C, CDKN2A (p14ARF), CDKN2A (p16INK4a), CEBPA, CHEK2, CTNNA1, DICER1, DIS3L2, EGFR (c.2369C>T, p.Thr790Met variant only), EPCAM (Deletion/duplication testing only), FH, FLCN, GATA2, GPC3, GREM1 (Promoter region  deletion/duplication testing only), HOXB13 (c.251G>A, p.Gly84Glu), HRAS, KIT, MAX, MEN1, MET, MITF (c.952G>A, p.Glu318Lys variant only), MLH1, MSH2, MSH3, MSH6, MUTYH, NBN, NF1, NF2, NTHL1, PALB2, PDGFRA, PHOX2B, PMS2, POLD1, POLE, POT1, PRKAR1A, PTCH1, PTEN, RAD50, RAD51C, RAD51D, RB1, RECQL4, RET, RUNX1, SDHAF2, SDHA (sequence changes only), SDHB, SDHC, SDHD, SMAD4, SMARCA4, SMARCB1, SMARCE1, STK11, SUFU, TERC, TERT, TMEM127, TP53, TSC1, TSC2, VHL, WRN and WT1.  The report date is April 27, 2018. ?  ?08/24/2018 Cancer Staging  ? Staging form: Breast, AJCC 8th Edition ?- Pathologic: No Stage Recommended (ypT1a, pN0, cM0, G2, ER-, PR-, HER2-) - Signed by Paula Berry, Paula Berry on 09/28/2018 ? ?  ?08/30/2018 Surgery  ? Left mastectomy Paula Berry) [phone]) revealed residual IDC, grade 2, negative margins. 12 axillary lymph nodes were negative for carcinoma. Triple negative, MIB-1 of 30%. ?  ?10/26/2018 - 11/22/2018 Radiation Therapy  ? The patient initially received a dose of 50.4 Gy in 28 fractions to the breast using whole-breast tangent fields. This was delivered using a 3-D conformal technique. The pt received a boost delivering an additional 10 Gy in 5 fractions using a electron boost with 59mV electrons. The total dose was 60.4 Gy. ?  ? ? ?CURRENT THERAPY: Observation ? ?INTERVAL HISTORY: ?Paula Mcfadden89y.o. female returns for evaluation of her history of left breast cancer.  She is doing well.  Her most recent right breast screening mammogram was completed on 05/08/2021 and was normal, with breast density C.   ? ?MDerekahas had a good year.  She has no concerns related to her breasts.  She notes she needs to increase her exercise and activity level.  She underwent allergy testing and learned that she is allergic to cats and had developed hives related to a coworker bringing increased amounts of cat dander in from her home.  Once MEricbegan to avoid this coworker at work her allergies  improved.   ? ?Patient Active Problem List  ? Diagnosis Date Noted  ? Obesity 08/21/2021  ? Gastroesophageal reflux disease without esophagitis 08/21/2021  ? Seasonal and perennial allergic rhinitis 02/24/2021  ? Chronic idiopathic urticaria 02/24/2021  ? Moderate persistent asthma without complication 007/05/1974 ? S/P mastectomy, left 08/30/2018  ? 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 Bronchiectasis ([place] 07/21/2018  ? Genetic testing 05/03/2018  ? Port-A-Cath in place 04/07/2018  ? Malignant neoplasm of upper-outer quadrant of left breast in female, estrogen receptor negative ([place] 03/21/2018  ? Migraine 11/21/2015  ? Fibroids 08/22/2015  ? ? ?is allergic to neosporin [neomycin-bacitracin zn-polymyx], bacitracin-polymyxin b, cat hair extract, and other. ? ?MEDICAL HISTORY: ?Past Medical History:  ?Diagnosis Date  ? Anemia   ? had iron infusion 08/12/15  ? Asthma   ? Breast cancer ([facility] 03/2018  ? Left Breast Cancer  ? Cancer [facility]   ? Breast cancer   ? Genital herpes   ? GERD (gastroesophageal reflux disease)   ? Headache   ? migraines 2-3 times monthly  ? Personal history of chemotherapy   ? Personal history of radiation therapy   ? Pneumonia   ? hospitalizied for pneumonia as a child  ? ? ?SURGICAL HISTORY: ?Past Surgical History:  ?Procedure Laterality Date  ? ABDOMINAL HYSTERECTOMY Bilateral 08/22/2015  ? Procedure: HYSTERECTOMY ABDOMINAL, BILATERAL SALPINGECTOMY;  Surgeon: JEverlene Farrier Paula Berry;  [facility];  Service: Gynecology;  Laterality: Bilateral;  ? MASTECTOMY Left 07/2018  ? MASTECTOMY WITH AXILLARY LYMPH NODE DISSECTION Left 08/30/2018  ? Procedure: Left mastectomy with axillary lymph node dissection;  Surgeon:  Rolm Bookbinder, Paula Berry;  Location: [facility];  Service: General;  Laterality: Left;  ? MYOMECTOMY ABDOMINAL APPROACH    ? PORTACATH PLACEMENT N/A 03/30/2018  ? Procedure: INSERTION PORT-A-CATH WITH [place];  Surgeon: Rolm Bookbinder, Paula Berry;  Location: [facility];  Service: General;   Laterality: N/A;  ? ? ?SOCIAL HISTORY: ?Social History  ? ?Socioeconomic History  ? Marital status: Married  ?  Spouse name: Edd Arbour  ? Number of children: 0  ? Years of education: 67  ? Highest education level: Not on file  ?Mariane Masters

## 2021-08-22 ENCOUNTER — Telehealth: Payer: Self-pay | Admitting: Adult Health

## 2021-08-22 NOTE — Telephone Encounter (Signed)
Scheduled appointment per 3/23 los. Unable to leave a voicemail due to mailbox being full. Patient will be mailed an updated calendar. ?

## 2021-08-24 NOTE — Assessment & Plan Note (Addendum)
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   ? ?1. 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  I recommended she continue with annual right breast mammograms which will be due again in 05/2022. ? ?2. Health maintneance: I recommended that she continue with healthy diet and exercise.  I recommended continued f/u with her PCP and that she stay up to date with GYN and colon cancer screening.   ? ?Paula Berry will return in 01/2022 for f/u with Dr. Lindi Adie with labs prior and I will see her back in 07/2021.  ?

## 2021-09-22 ENCOUNTER — Other Ambulatory Visit: Payer: Self-pay | Admitting: *Deleted

## 2021-09-22 DIAGNOSIS — Z171 Estrogen receptor negative status [ER-]: Secondary | ICD-10-CM

## 2021-09-22 NOTE — Progress Notes (Signed)
Received call from pt with complaint of left arm/breast discomfort and swelling x2 days.  Pt states symptoms are similar to previous lymphedema flares. Pt denies recent injury or trauma.  Pt also denies redness or warmth to extremity. Per MD verbal orders received for pt to f/u with [facility].  Referral placed.  ?

## 2021-09-22 NOTE — Therapy (Incomplete)
?OUTPATIENT PHYSICAL THERAPY ONCOLOGY EVALUATION ? ?Patient Name: Paula Berry ?MRN: [id number] ?DOB:04-09-1971, 51 y.o., female ?Today's Date: 09/22/2021 ? ? ? ?Past Medical History:  ?Diagnosis Date  ? Anemia   ? had iron infusion 08/12/15  ? Asthma   ? Breast cancer ([place]) 03/2018  ? Left Breast Cancer  ? Cancer [facility])   ? Breast cancer   ? Genital herpes   ? GERD (gastroesophageal reflux disease)   ? Headache   ? migraines 2-3 times monthly  ? Personal history of chemotherapy   ? Personal history of radiation therapy   ? Pneumonia   ? hospitalizied for pneumonia as a child  ? ?Past Surgical History:  ?Procedure Laterality Date  ? ABDOMINAL HYSTERECTOMY Bilateral 08/22/2015  ? Procedure: HYSTERECTOMY ABDOMINAL, BILATERAL SALPINGECTOMY;  Surgeon: Everlene Farrier, MD;  Location: [facility];  Service: Gynecology;  Laterality: Bilateral;  ? MASTECTOMY Left 07/2018  ? MASTECTOMY WITH AXILLARY LYMPH NODE DISSECTION Left 08/30/2018  ? Procedure: Left mastectomy with axillary lymph node dissection;  Surgeon: Rolm Bookbinder, MD;  Location: [place];  Service: General;  Laterality: Left;  ? MYOMECTOMY ABDOMINAL APPROACH    ? PORTACATH PLACEMENT N/A 03/30/2018  ? Procedure: INSERTION PORT-A-CATH WITH [place];  Surgeon: Rolm Bookbinder, MD;  Location: [place];  Service: General;  Laterality: N/A;  ? ?Patient Active Problem List  ? Diagnosis Date Noted  ? Obesity 08/21/2021  ? Gastroesophageal reflux disease without esophagitis 08/21/2021  ? Seasonal and perennial allergic rhinitis 02/24/2021  ? Chronic idiopathic urticaria 02/24/2021  ? Moderate persistent asthma without complication 40/98/1191  ? S/P mastectomy, left 08/30/2018  ? 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 Bronchiectasis ([place]) 07/21/2018  ? Genetic testing 05/03/2018  ? Port-A-Cath in place 04/07/2018  ? Malignant neoplasm of upper-outer quadrant of left breast in female, estrogen receptor negative ([place]) 03/21/2018  ? Migraine 11/21/2015  ? Fibroids  08/22/2015  ? ? ?PCP: Vernie Shanks, MD ? ?REFERRING PROVIDER: Nicholas Lose, MD ? ?REFERRING DIAG: Lt UE lymphedema ? ?THERAPY DIAG:  ?No diagnosis found. ? ?ONSET DATE: 08/30/18 ? ?SUBJECTIVE                                                                                                                                                                                          ? ?SUBJECTIVE STATEMENT: ? ?PERTINENT HISTORY:  ?S/p Lt modified radical mastectomy on 08/30/18 with Dr. [name].  SLNB x 12 lymph nodes all negative. Chemo and radiation completed. History of migraines. Has flexitouch, tribute vest? ? ?PAIN:  ?Are you having pain? {yes/no:20286} ?NPRS scale: ***/10 ?Pain location: *** ?Pain orientation: {Pain Orientation:25161}  ?PAIN  TYPE: {type:313116} ?Pain description: {PAIN DESCRIPTION:21022940}  ?Aggravating factors: *** ?Relieving factors: *** ? ?PRECAUTIONS: {Therapy precautions:24002} ? ?WEIGHT BEARING RESTRICTIONS {Yes ***/No:24003} ? ?FALLS:  ?Has patient fallen in last 6 months? {fallsyesno:27318} ? ?LIVING ENVIRONMENT: ?Lives with: {[facility] lives with:25569::"lives with their family"} ?Lives in: {Lives in:25570} ?Stairs: {opstairs:27293} ?Has following equipment at home: {Assistive devices:23999} ? ?OCCUPATION: *** ? ?LEISURE: *** ? ?HAND DOMINANCE : {RIGHT/LEFT:21944}  ? ?PRIOR LEVEL OF FUNCTION: {PLOF:24004} ? ?PATIENT GOALS *** ? ? ?OBJECTIVE ? ?COGNITION: ? Overall cognitive status: {cognition:24006}  ? ?PALPATION: *** ? ?OBSERVATIONS / OTHER ASSESSMENTS: *** ? ?SENSATION: ? Light touch: {intact/deficits:24005} ? Stereognosis: {intact/deficits:24005} ? Hot/Cold: {intact/deficits:24005} ? Proprioception: {intact/deficits:24005} ? ?POSTURE: *** ? ?UPPER EXTREMITY AROM/PROM: ? ?A/PROM RIGHT  09/22/2021 ?  ?Shoulder extension   ?Shoulder flexion   ?Shoulder abduction   ?Shoulder internal rotation   ?Shoulder external rotation   ?  (Blank rows = not tested) ? ?A/PROM LEFT  09/22/2021  ?Shoulder  extension   ?Shoulder flexion   ?Shoulder abduction   ?Shoulder internal rotation   ?Shoulder external rotation   ?  (Blank rows = not tested) ? ? ?CERVICAL AROM: ?All within normal limits:  ? ? Percent limited  ?Flexion   ?Extension   ?Right lateral flexion   ?Left lateral flexion   ?Right rotation   ?Left rotation   ? ? ? ?UPPER EXTREMITY STRENGTH: *** ? ? ?LE AROM/PROM: ? ?A/PROM Right ?09/22/2021  ?Hip flexion   ?Hip extension   ?Hip abduction   ?Hip adduction   ?Hip internal rotation   ?Hip external rotation   ?Knee flexion   ?Knee extension   ?Ankle dorsiflexion   ?Ankle plantarflexion   ?Ankle inversion   ?Ankle eversion   ? (Blank rows = not tested) ? ?A/PROM LEFT ?09/22/2021  ?Hip flexion   ?Hip extension   ?Hip abduction   ?Hip adduction   ?Hip internal rotation   ?Hip external rotation   ?Knee flexion   ?Knee extension   ?Ankle dorsiflexion   ?Ankle plantarflexion   ?Ankle inversion   ?Ankle eversion   ? (Blank rows = not tested) ? ?LE MMT: *** ? ?LYMPHEDEMA ASSESSMENTS:  ? ?SURGERY TYPE/DATE: *** ? ?NUMBER OF LYMPH NODES REMOVED: *** ? ?CHEMOTHERAPY: *** ? ?RADIATION:*** ? ?HORMONE TREATMENT: *** ? ?INFECTIONS: *** ? ?LYMPHEDEMA ASSESSMENTS:  ? ?[place] RIGHT  09/22/2021  ?10 cm proximal to olecranon process   ?Olecranon process   ?10 cm proximal to ulnar styloid process   ?Just proximal to ulnar styloid process   ?Across hand at thumb web space   ?At base of 2nd digit   ?(Blank rows = not tested) ? ?[place] LEFT  09/22/2021  ?10 cm proximal to olecranon process   ?Olecranon process   ?10 cm proximal to ulnar styloid process   ?Just proximal to ulnar styloid process   ?Across hand at thumb web space   ?At base of 2nd digit   ?(Blank rows = not tested) ? ? ? ?LE LANDMARK RIGHT ?09/22/2021  ?At groin   ?30 cm proximal to suprapatella   ?20 cm proximal to suprapatella   ?10 cm proximal to suprapatella   ?At [place] / popliteal crease   ?30 cm proximal to floor at lateral plantar foot   ?20 cm proximal to  floor at lateral plantar foot   ?10 cm proximal to floor at lateral plantar foot   ?Circumference of ankle/heel   ?5 cm proximal to 1st MTP joint   ?Across MTP  joint   ?Around proximal great toe   ?(Blank rows = not tested) ? ?LE [place] LEFT ?09/22/2021  ?At groin   ?30 cm proximal to suprapatella   ?20 cm proximal to suprapatella   ?10 cm proximal to suprapatella   ?At [place] / popliteal crease   ?30 cm proximal to floor at lateral plantar foot   ?20 cm proximal to floor at lateral plantar foot   ?10 cm proximal to floor at lateral plantar foot   ?Circumference of ankle/heel   ?5 cm proximal to 1st MTP joint   ?Across MTP joint   ?Around proximal great toe   ?(Blank rows = not tested) ? ?FUNCTIONAL TESTS:  ?{Functional tests:24029} ? ?GAIT: ?Distance walked: *** ?Assistive device utilized: {Assistive devices:23999} ?Level of assistance: {Levels of assistance:24026} ?Comments: *** ? ? ?L-DEX LYMPHEDEMA SCREENING: ? ?The patient was assessed using the L-Dex machine today to produce a lymphedema index baseline score. The patient will be reassessed on a regular basis (typically every 3 months) to obtain new L-Dex scores. If the score is > 6.5 points away from his/her baseline score indicating onset of subclinical lymphedema, it will be recommended to wear a compression garment for 4 weeks, 12 hours per day and then be reassessed. If the score continues to be > 6.5 points from baseline at reassessment, we will initiate lymphedema treatment. Assessing in this manner has a 95% rate of preventing clinically significant lymphedema. ? ? ? ? ?QUICK DASH SURVEY: *** ? ? ?TODAY'S TREATMENT  ?*** ? ?PATIENT EDUCATION:  ?Education details: *** ?Person educated: {Person educated:25204} ?Education method: {Education Method:25205} ?Education comprehension: {Education Comprehension:25206} ? ? ?HOME EXERCISE PROGRAM: ?*** ? ?ASSESSMENT: ? ?CLINICAL IMPRESSION: ?Patient is a *** y.o. *** who was seen today for physical therapy  evaluation and treatment for ***.  ? ? ?OBJECTIVE IMPAIRMENTS {opptimpairments:25111}.  ? ?ACTIVITY LIMITATIONS {activity limitations:25113}.  ? ?PERSONAL FACTORS {Personal factors:25162} are also affecting pat

## 2021-09-23 ENCOUNTER — Ambulatory Visit: Payer: BC Managed Care – PPO | Admitting: Rehabilitation

## 2021-09-23 ENCOUNTER — Telehealth: Payer: Self-pay

## 2021-09-23 NOTE — Telephone Encounter (Signed)
Called and spoke with pt, she is declining tx at [facility] location for her breast lymphedema due to a personal conflict with staff there. I informed pt that we would be happy to send a referral elsewhere if she can identify a location that is in network for her insurance.  I advised pt to call her insurance company to determine best location for her, pt verbalized frustration and understanding.  Pt knows to report back to [place] so that we can complete referral process  ?

## 2021-11-03 ENCOUNTER — Telehealth: Payer: Self-pay | Admitting: Hematology and Oncology

## 2021-11-03 NOTE — Telephone Encounter (Signed)
Called per 6/1 inbasket, left msg

## 2021-11-04 ENCOUNTER — Ambulatory Visit: Payer: BC Managed Care – PPO | Admitting: Hematology and Oncology

## 2022-01-25 ENCOUNTER — Other Ambulatory Visit: Payer: Self-pay | Admitting: Internal Medicine

## 2022-01-25 DIAGNOSIS — J4540 Moderate persistent asthma, uncomplicated: Secondary | ICD-10-CM

## 2022-01-25 DIAGNOSIS — J310 Chronic rhinitis: Secondary | ICD-10-CM

## 2022-01-25 DIAGNOSIS — L501 Idiopathic urticaria: Secondary | ICD-10-CM

## 2022-01-26 NOTE — Telephone Encounter (Signed)
Please provide one refill and set her up for follow-up.

## 2022-02-05 NOTE — Progress Notes (Signed)
Patient Care Team: Vernie Shanks, MD (Inactive) as PCP - General (Family Medicine) Rolm Bookbinder, MD as Consulting Physician (General Surgery) Eppie Gibson, MD as Attending Physician (Radiation Oncology) Everlene Farrier, MD as Consulting Physician (Obstetrics and Gynecology) Everlean Alstrom, MD as Attending Physician (Radiology) Nicholas Lose, MD as Consulting Physician (Hematology and Oncology)  DIAGNOSIS: No diagnosis found.  SUMMARY OF ONCOLOGIC HISTORY: Oncology History  Malignant neoplasm of upper-outer quadrant of left breast in female, estrogen receptor negative ([place])  03/21/2018 Initial Diagnosis   Malignant neoplasm of upper-outer quadrant of left breast in female, estrogen receptor negative ([place])   04/07/2018 - 08/10/2018 Chemotherapy   DOXOrubicin (ADRIAMYCIN) chemo injection 130 mg, 60 mg/m2 = 130 mg, Intravenous,  Once, 4 of 4 cycles. Administration: 130 mg (04/07/2018), 130 mg (04/21/2018), 130 mg (05/05/2018), 130 mg (05/19/2018)  palonosetron (ALOXI) injection 0.25 mg, 0.25 mg, Intravenous,  Once, 14 of 16 cycles. Administration: 0.25 mg (04/07/2018), 0.25 mg (06/02/2018), 0.25 mg (04/21/2018), 0.25 mg (05/05/2018), 0.25 mg (05/19/2018), 0.25 mg (06/09/2018), 0.25 mg (06/16/2018), 0.25 mg (06/23/2018), 0.25 mg (06/30/2018), 0.25 mg (07/07/2018), 0.25 mg (07/14/2018), 0.25 mg (07/21/2018), 0.25 mg (07/28/2018), 0.25 mg (08/04/2018)  pegfilgrastim-cbqv (UDENYCA) injection 6 mg, 6 mg, Subcutaneous, Once, 4 of 4 cycles. Administration: 6 mg (04/09/2018), 6 mg (04/23/2018), 6 mg (05/07/2018), 6 mg (05/21/2018)  CARBOplatin (PARAPLATIN) 300 mg in sodium chloride 0.9 % 250 mL chemo infusion, 300 mg (100 % of original dose 300 mg), Intravenous,  Once, 10 of 12 cycles. Dose modification:   (original dose 300 mg, Cycle 5). Administration: 300 mg (06/02/2018), 300 mg (06/09/2018), 300 mg (06/16/2018), 300 mg (06/23/2018), 300 mg (06/30/2018), 300 mg (07/07/2018), 300 mg (07/14/2018), 300 mg (07/21/2018), 300  mg (07/28/2018), 300 mg (08/04/2018)  cyclophosphamide (CYTOXAN) 1,300 mg in sodium chloride 0.9 % 250 mL chemo infusion, 600 mg/m2 = 1,300 mg, Intravenous,  Once, 4 of 4 cycles. Administration: 1,300 mg (04/07/2018), 1,300 mg (04/21/2018), 1,300 mg (05/05/2018), 1,300 mg (05/19/2018)  PACLitaxel (TAXOL) 174 mg in sodium chloride 0.9 % 250 mL chemo infusion (</= 36m/m2), 80 mg/m2 = 174 mg, Intravenous,  Once, 10 of 12 cycles. Administration: 174 mg (06/02/2018), 174 mg (06/09/2018), 174 mg (06/16/2018), 174 mg (06/23/2018), 174 mg (06/30/2018), 174 mg (07/07/2018), 174 mg (07/14/2018), 174 mg (07/21/2018), 174 mg (07/28/2018), 174 mg (08/04/2018)  fosaprepitant (EMEND) 150 mg  dexamethasone (DECADRON) 12 mg in sodium chloride 0.9 % 145 mL IVPB, , Intravenous,  Once, 14 of 16 cycles. Administration:  (04/07/2018),  (06/02/2018),  (04/21/2018),  (05/05/2018),  (05/19/2018),  (06/09/2018),  (06/16/2018),  (06/23/2018),  (06/30/2018),  (07/07/2018),  (07/14/2018),  (07/21/2018),  (07/28/2018),  (08/04/2018)    04/27/2018 Genetic Testing   Negative genetic testing on the multi-cancer panel.  The Multi-Gene Panel offered by [facility] includes sequencing and/or deletion duplication testing of the following 84 genes: AIP, ALK, APC, ATM, AXIN2,BAP1,  BARD1, BLM, BMPR1A, BRCA1, BRCA2, BRIP1, CASR, CDC73, CDH1, CDK4, CDKN1B, CDKN1C, CDKN2A (p14ARF), CDKN2A (p16INK4a), CEBPA, CHEK2, CTNNA1, DICER1, DIS3L2, EGFR (c.2369C>T, p.Thr790Met variant only), EPCAM (Deletion/duplication testing only), FH, FLCN, GATA2, GPC3, GREM1 (Promoter region deletion/duplication testing only), HOXB13 (c.251G>A, p.Gly84Glu), HRAS, KIT, MAX, MEN1, MET, MITF (c.952G>A, p.Glu318Lys variant only), MLH1, MSH2, MSH3, MSH6, MUTYH, NBN, NF1, NF2, NTHL1, PALB2, PDGFRA, PHOX2B, PMS2, POLD1, POLE, POT1, PRKAR1A, PTCH1, PTEN, RAD50, RAD51C, RAD51D, RB1, RECQL4, RET, RUNX1, SDHAF2, SDHA (sequence changes only), SDHB, SDHC, SDHD, SMAD4, SMARCA4, SMARCB1, SMARCE1, STK11, SUFU, TERC, TERT,  TMEM127, TP53, TSC1, TSC2, VHL, WRN and WT1.  The report date is  April 27, 2018.   08/24/2018 Cancer Staging   Staging form: Breast, AJCC 8th Edition - Pathologic: No Stage Recommended (ypT1a, pN0, cM0, G2, ER-, PR-, HER2-) - Signed by Eppie Gibson, MD on 09/28/2018   08/30/2018 Surgery   Left mastectomy [name]) [phone]) revealed residual IDC, grade 2, negative margins. 12 axillary lymph nodes were negative for carcinoma. Triple negative, MIB-1 of 30%.   10/26/2018 - 11/22/2018 Radiation Therapy   The patient initially received a dose of 50.4 Gy in 28 fractions to the breast using whole-breast tangent fields. This was delivered using a 3-D conformal technique. The pt received a boost delivering an additional 10 Gy in 5 fractions using a electron boost with 42mV electrons. The total dose was 60.4 Gy.     CHIEF COMPLIANT: Follow-up breast cancer observation  INTERVAL HISTORY: Paula Tinneris a 51y.o with the above mention. She presents to the clinic today for a follow-up.    ALLERGIES:  is allergic to neosporin [neomycin-bacitracin zn-polymyx], bacitracin-polymyxin b, cat hair extract, and other.  MEDICATIONS:  Current Outpatient Medications  Medication Sig Dispense Refill   acetaminophen (TYLENOL) 500 MG tablet Take 1,000 mg by mouth every 8 (eight) hours as needed for moderate pain. (Patient not taking: Reported on 02/24/2021)     albuterol (PROVENTIL HFA;VENTOLIN HFA) 108 (90 Base) MCG/ACT inhaler Inhale 2 puffs into the lungs every 6 (six) hours as needed for wheezing or shortness of breath.      cimetidine (TAGAMET) 400 MG tablet Take 400 mg by mouth 3 (three) times daily.     EPINEPHrine 0.3 mg/0.3 mL IJ SOAJ injection Inject 0.3 mg into the muscle once.     hydrOXYzine (VISTARIL) 25 MG capsule Take 25-50 mg by mouth at bedtime.     loratadine (CLARITIN) 10 MG tablet Take 1 tablet twice daily for hives. Can increase to maximum dose of 2 tablets twice daily if  needed. 120 tablet 5   meloxicam (MOBIC) 15 MG tablet Take 15 mg by mouth daily as needed.     montelukast (SINGULAIR) 10 MG tablet TAKE 1 TABLET(10 MG) BY MOUTH EVERY EVENING 30 tablet 5   pantoprazole (PROTONIX) 40 MG tablet Take 40 mg by mouth every evening.     SYMBICORT 160-4.5 MCG/ACT inhaler INL 2 PFS PO BID  3   valACYclovir (VALTREX) 500 MG tablet Take 500 mg by mouth See admin instructions. Take 500 mg twice daily for 3 days at onset of outbreak then take 500 mg once daily until clear     No current facility-administered medications for this visit.    PHYSICAL EXAMINATION: ECOG PERFORMANCE STATUS: {CHL ONC ECOG [phone]}  There were no vitals filed for this visit. There were no vitals filed for this visit.  BREAST:*** No palpable masses or nodules in either right or left breasts. No palpable axillary supraclavicular or infraclavicular adenopathy no breast tenderness or nipple discharge. (exam performed in the presence of a chaperone)  LABORATORY DATA:  I have reviewed the data as listed    Latest Ref Rng & Units 08/21/2021    1:43 PM 05/07/2021   10:23 AM 10/03/2020    8:17 AM  CMP  Glucose 70 - 99 mg/dL 115  88  92   BUN 6 - 20 mg/dL _0 Creatinine 0.44 - 1.00 mg/dL 0.64  0.89  0.74   Sodium 135 - 145 mmol/L 140  145  142   Potassium 3.5 - 5.1 mmol/L 3.7  4.7  3.9   Chloride 98 - 111 mmol/L 108  110  107   CO2 22 - 32 mmol/L _0 Calcium 8.9 - 10.3 mg/dL 9.6  9.5  9.1   Total Protein 6.5 - 8.1 g/dL 6.8  7.3  6.7   Total Bilirubin 0.3 - 1.2 mg/dL 1.0  1.2  1.1   Alkaline Phos 38 - 126 U/L 73  68  72   AST 15 - 41 U/L _1 ALT 0 - 44 U/L _2 Lab Results  Component Value Date   WBC 6.2 08/21/2021   HGB 14.1 08/21/2021   HCT 42.6 08/21/2021   MCV 94.7 08/21/2021   PLT 200 08/21/2021   NEUTROABS 3.4 08/21/2021    ASSESSMENT & PLAN:  No problem-specific Assessment & Plan notes found for this encounter.    No orders of  the defined types were placed in this encounter.  The patient has a good understanding of the overall plan. she agrees with it. she will call with any problems that may develop before the next visit here. Total time spent: 30 mins including face to face time and time spent for planning, charting and co-ordination of care   Suzzette Righter, [place] 02/05/22    I Gardiner Coins am scribing for Dr. Lindi Adie  ***

## 2022-02-09 ENCOUNTER — Inpatient Hospital Stay: Payer: BC Managed Care – PPO

## 2022-02-09 ENCOUNTER — Other Ambulatory Visit: Payer: Self-pay

## 2022-02-09 ENCOUNTER — Inpatient Hospital Stay: Payer: BC Managed Care – PPO | Attending: Hematology and Oncology | Admitting: Hematology and Oncology

## 2022-02-09 DIAGNOSIS — Z9012 Acquired absence of left breast and nipple: Secondary | ICD-10-CM | POA: Diagnosis not present

## 2022-02-09 DIAGNOSIS — Z171 Estrogen receptor negative status [ER-]: Secondary | ICD-10-CM | POA: Diagnosis not present

## 2022-02-09 DIAGNOSIS — C50412 Malignant neoplasm of upper-outer quadrant of left female breast: Secondary | ICD-10-CM

## 2022-02-09 DIAGNOSIS — Z853 Personal history of malignant neoplasm of breast: Secondary | ICD-10-CM | POA: Diagnosis present

## 2022-02-09 LAB — CMP (CANCER CENTER ONLY)
ALT: 12 U/L (ref 0–44)
AST: 17 U/L (ref 15–41)
Albumin: 4.2 g/dL (ref 3.5–5.0)
Alkaline Phosphatase: 67 U/L (ref 38–126)
Anion gap: 3 — ABNORMAL LOW (ref 5–15)
BUN: 11 mg/dL (ref 6–20)
CO2: 30 mmol/L (ref 22–32)
Calcium: 9.2 mg/dL (ref 8.9–10.3)
Chloride: 106 mmol/L (ref 98–111)
Creatinine: 0.7 mg/dL (ref 0.44–1.00)
GFR, Estimated: >60 mL/min (ref >60)
Glucose, Bld: 114 mg/dL — ABNORMAL HIGH (ref 70–99)
Potassium: 3.7 mmol/L (ref 3.5–5.1)
Sodium: 139 mmol/L (ref 135–145)
Total Bilirubin: 1.1 mg/dL (ref 0.3–1.2)
Total Protein: 6.9 g/dL (ref 6.5–8.1)

## 2022-02-09 LAB — CBC WITH DIFFERENTIAL (CANCER CENTER ONLY)
Abs Immature Granulocytes: 0.01 10*3/uL (ref 0.00–0.07)
Basophils Absolute: 0 10*3/uL (ref 0.0–0.1)
Basophils Relative: 1 %
Eosinophils Absolute: 0.1 10*3/uL (ref 0.0–0.5)
Eosinophils Relative: 1 %
HCT: 43 % (ref 36.0–46.0)
Hemoglobin: 14.3 g/dL (ref 12.0–15.0)
Immature Granulocytes: 0 %
Lymphocytes Relative: 45 %
Lymphs Abs: 2.9 10*3/uL (ref 0.7–4.0)
MCH: 32.1 pg (ref 26.0–34.0)
MCHC: 33.3 g/dL (ref 30.0–36.0)
MCV: 96.4 fL (ref 80.0–100.0)
Monocytes Absolute: 0.5 10*3/uL (ref 0.1–1.0)
Monocytes Relative: 7 %
Neutro Abs: 2.9 10*3/uL (ref 1.7–7.7)
Neutrophils Relative %: 46 %
Platelet Count: 248 10*3/uL (ref 150–400)
RBC: 4.46 MIL/uL (ref 3.87–5.11)
RDW: 12.3 % (ref 11.5–15.5)
WBC Count: 6.4 10*3/uL (ref 4.0–10.5)
nRBC: 0 % (ref 0.0–0.2)

## 2022-02-09 NOTE — Assessment & Plan Note (Signed)
03/16/2018 for a clinical T2 N3, stage IIIC invasive ductal carcinoma, triple negative, with an MIB-1 of 15% Neoadjuvant chemotherapy consisting of doxorubicin and cyclophosphamide in dose dense fashion x4 started 04/07/2018, completed 05/19/2018,  followed by weekly carboplatin and paclitaxel x10 started 06/02/2018, last dose 08/04/2018 (stopped two cycles early due to peripheral neuropathy) 08/30/2018: Left mastectomy: T1 a N0 grade 2 IDC with negative margins 12 axillary lymph nodes negative, triple negative Ki-67 30% 11/22/2018: Completed adjuvant radiation 10/31/2018: Insufficient tissue for [facility] testing  Breast cancer surveillance: 1.  Breast exam 02/09/2022: Benign 2. mammogram: Right breast: 05/08/2021: Benign breast density category C  Return to clinic in 1 year for follow-up

## 2022-02-10 ENCOUNTER — Telehealth: Payer: Self-pay | Admitting: Hematology and Oncology

## 2022-02-10 NOTE — Telephone Encounter (Signed)
Scheduled appointment per 9/11 los. Left voicemail.

## 2022-04-06 ENCOUNTER — Other Ambulatory Visit: Payer: Self-pay | Admitting: Internal Medicine

## 2022-04-06 DIAGNOSIS — L501 Idiopathic urticaria: Secondary | ICD-10-CM

## 2022-04-06 DIAGNOSIS — J310 Chronic rhinitis: Secondary | ICD-10-CM

## 2022-06-17 ENCOUNTER — Encounter: Payer: Self-pay | Admitting: General Surgery

## 2022-07-07 ENCOUNTER — Other Ambulatory Visit: Payer: Self-pay | Admitting: Obstetrics and Gynecology

## 2022-07-07 DIAGNOSIS — R928 Other abnormal and inconclusive findings on diagnostic imaging of breast: Secondary | ICD-10-CM

## 2022-07-14 ENCOUNTER — Ambulatory Visit
Admission: RE | Admit: 2022-07-14 | Discharge: 2022-07-14 | Disposition: A | Discharge status: Home / Self Care | Payer: BC Managed Care – PPO | Source: Ambulatory Visit | Attending: Obstetrics and Gynecology | Admitting: Obstetrics and Gynecology

## 2022-07-14 ENCOUNTER — Other Ambulatory Visit: Payer: Self-pay | Admitting: Obstetrics and Gynecology

## 2022-07-14 DIAGNOSIS — N6310 Unspecified lump in the right breast, unspecified quadrant: Secondary | ICD-10-CM

## 2022-07-14 DIAGNOSIS — R928 Other abnormal and inconclusive findings on diagnostic imaging of breast: Secondary | ICD-10-CM

## 2022-07-22 ENCOUNTER — Ambulatory Visit
Admission: RE | Admit: 2022-07-22 | Discharge: 2022-07-22 | Disposition: A | Discharge status: Home / Self Care | Payer: BC Managed Care – PPO | Source: Ambulatory Visit | Attending: Obstetrics and Gynecology | Admitting: Obstetrics and Gynecology

## 2022-07-22 ENCOUNTER — Other Ambulatory Visit: Payer: Self-pay | Admitting: Obstetrics and Gynecology

## 2022-07-22 DIAGNOSIS — N6310 Unspecified lump in the right breast, unspecified quadrant: Secondary | ICD-10-CM

## 2022-07-22 HISTORY — PX: BREAST BIOPSY: SHX20

## 2022-08-10 ENCOUNTER — Telehealth: Payer: Self-pay

## 2022-08-10 ENCOUNTER — Inpatient Hospital Stay: Payer: BC Managed Care – PPO | Attending: Adult Health | Admitting: Adult Health

## 2022-08-10 DIAGNOSIS — Z808 Family history of malignant neoplasm of other organs or systems: Secondary | ICD-10-CM | POA: Insufficient documentation

## 2022-08-10 DIAGNOSIS — M545 Low back pain, unspecified: Secondary | ICD-10-CM | POA: Insufficient documentation

## 2022-08-10 DIAGNOSIS — Z91199 Patient's noncompliance with other medical treatment and regimen due to unspecified reason: Secondary | ICD-10-CM

## 2022-08-10 DIAGNOSIS — Z171 Estrogen receptor negative status [ER-]: Secondary | ICD-10-CM

## 2022-08-10 DIAGNOSIS — Z853 Personal history of malignant neoplasm of breast: Secondary | ICD-10-CM | POA: Insufficient documentation

## 2022-08-10 DIAGNOSIS — Z9012 Acquired absence of left breast and nipple: Secondary | ICD-10-CM | POA: Insufficient documentation

## 2022-08-10 DIAGNOSIS — Z801 Family history of malignant neoplasm of trachea, bronchus and lung: Secondary | ICD-10-CM | POA: Insufficient documentation

## 2022-08-10 DIAGNOSIS — C50412 Malignant neoplasm of upper-outer quadrant of left female breast: Secondary | ICD-10-CM

## 2022-08-10 NOTE — Telephone Encounter (Signed)
Called patient and LVM regarding no show to appointment that is scheduled with Wilber Bihari NP at 2:45pm today. Gave callback # [phone].

## 2022-08-10 NOTE — Progress Notes (Signed)
Paula Berry did not show up for her appointment today.  No show letter was mailed and sent to the patient via [place].  She was also called by nursing.  Wilber Bihari, NP 08/10/22 3:10 PM Medical Oncology and Hematology [facility] [place],  [address] Tel. [phone]    Fax. [phone]

## 2022-08-18 ENCOUNTER — Telehealth: Payer: Self-pay | Admitting: Adult Health

## 2022-08-18 NOTE — Telephone Encounter (Signed)
Per 3/19 IB reached out to patient to schedule; patient aware of date and time of appointment.

## 2022-08-24 ENCOUNTER — Inpatient Hospital Stay: Payer: BC Managed Care – PPO | Admitting: Adult Health

## 2022-08-24 ENCOUNTER — Encounter: Payer: Self-pay | Admitting: Adult Health

## 2022-08-24 VITALS — BP 115/75 | HR 77 | Temp 97.4°F | Resp 18 | Ht 69.0 in | Wt 225.4 lb

## 2022-08-24 DIAGNOSIS — M5441 Lumbago with sciatica, right side: Secondary | ICD-10-CM | POA: Diagnosis not present

## 2022-08-24 DIAGNOSIS — Z9012 Acquired absence of left breast and nipple: Secondary | ICD-10-CM | POA: Diagnosis not present

## 2022-08-24 DIAGNOSIS — Z171 Estrogen receptor negative status [ER-]: Secondary | ICD-10-CM | POA: Diagnosis not present

## 2022-08-24 DIAGNOSIS — Z853 Personal history of malignant neoplasm of breast: Secondary | ICD-10-CM | POA: Diagnosis not present

## 2022-08-24 DIAGNOSIS — Z801 Family history of malignant neoplasm of trachea, bronchus and lung: Secondary | ICD-10-CM | POA: Diagnosis not present

## 2022-08-24 DIAGNOSIS — Z808 Family history of malignant neoplasm of other organs or systems: Secondary | ICD-10-CM | POA: Diagnosis not present

## 2022-08-24 DIAGNOSIS — C50412 Malignant neoplasm of upper-outer quadrant of left female breast: Secondary | ICD-10-CM | POA: Diagnosis not present

## 2022-08-24 DIAGNOSIS — M545 Low back pain, unspecified: Secondary | ICD-10-CM | POA: Diagnosis not present

## 2022-08-24 NOTE — Assessment & Plan Note (Signed)
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    1. 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  Repeat mammogram due in February 2025.  2.  Lower back pain: Will obtain plain films today to further evaluate.  I recommended yoga to help with strengthening and stretching of her lower back.  We reviewed health maintenance and she will return in 6 months for follow-up with Dr. Lindi Adie in 1 year for follow-up with me.

## 2022-08-24 NOTE — Progress Notes (Signed)
[facility] Follow up:    Vernie Shanks, MD (Inactive) No address on file   DIAGNOSIS:  Cancer Staging  Malignant neoplasm of upper-outer quadrant of left breast in female, estrogen receptor negative ([place]) Staging form: Breast, AJCC 8th Edition - Clinical stage from 03/23/2018: Stage IIIB (cT2, cN1, cM0, G3, ER-, PR-, HER2-) - Unsigned Histologic grading system: 3 grade system Laterality: Left Staged by: Pathologist and managing physician Stage used in treatment planning: Yes National guidelines used in treatment planning: Yes Type of national guideline used in treatment planning: NCCN - Pathologic: No Stage Recommended (ypT1a, pN0, cM0, G2, ER-, PR-, HER2-) - Signed by Eppie Gibson, MD on 09/28/2018 Stage prefix: Post-therapy Neoadjuvant therapy: Yes Histologic grading system: 3 grade system   SUMMARY OF ONCOLOGIC HISTORY: Oncology History  Malignant neoplasm of upper-outer quadrant of left breast in female, estrogen receptor negative ([place])  03/21/2018 Initial Diagnosis   Malignant neoplasm of upper-outer quadrant of left breast in female, estrogen receptor negative ([place])   04/07/2018 - 08/10/2018 Chemotherapy   DOXOrubicin (ADRIAMYCIN) chemo injection 130 mg, 60 mg/m2 = 130 mg, Intravenous,  Once, 4 of 4 cycles. Administration: 130 mg (04/07/2018), 130 mg (04/21/2018), 130 mg (05/05/2018), 130 mg (05/19/2018)  palonosetron (ALOXI) injection 0.25 mg, 0.25 mg, Intravenous,  Once, 14 of 16 cycles. Administration: 0.25 mg (04/07/2018), 0.25 mg (06/02/2018), 0.25 mg (04/21/2018), 0.25 mg (05/05/2018), 0.25 mg (05/19/2018), 0.25 mg (06/09/2018), 0.25 mg (06/16/2018), 0.25 mg (06/23/2018), 0.25 mg (06/30/2018), 0.25 mg (07/07/2018), 0.25 mg (07/14/2018), 0.25 mg (07/21/2018), 0.25 mg (07/28/2018), 0.25 mg (08/04/2018)  pegfilgrastim-cbqv (UDENYCA) injection 6 mg, 6 mg, Subcutaneous, Once, 4 of 4 cycles. Administration: 6 mg (04/09/2018), 6 mg (04/23/2018), 6 mg (05/07/2018), 6 mg  (05/21/2018)  CARBOplatin (PARAPLATIN) 300 mg in sodium chloride 0.9 % 250 mL chemo infusion, 300 mg (100 % of original dose 300 mg), Intravenous,  Once, 10 of 12 cycles. Dose modification:   (original dose 300 mg, Cycle 5). Administration: 300 mg (06/02/2018), 300 mg (06/09/2018), 300 mg (06/16/2018), 300 mg (06/23/2018), 300 mg (06/30/2018), 300 mg (07/07/2018), 300 mg (07/14/2018), 300 mg (07/21/2018), 300 mg (07/28/2018), 300 mg (08/04/2018)  cyclophosphamide (CYTOXAN) 1,300 mg in sodium chloride 0.9 % 250 mL chemo infusion, 600 mg/m2 = 1,300 mg, Intravenous,  Once, 4 of 4 cycles. Administration: 1,300 mg (04/07/2018), 1,300 mg (04/21/2018), 1,300 mg (05/05/2018), 1,300 mg (05/19/2018)  PACLitaxel (TAXOL) 174 mg in sodium chloride 0.9 % 250 mL chemo infusion (</= 80mg /m2), 80 mg/m2 = 174 mg, Intravenous,  Once, 10 of 12 cycles. Administration: 174 mg (06/02/2018), 174 mg (06/09/2018), 174 mg (06/16/2018), 174 mg (06/23/2018), 174 mg (06/30/2018), 174 mg (07/07/2018), 174 mg (07/14/2018), 174 mg (07/21/2018), 174 mg (07/28/2018), 174 mg (08/04/2018)  fosaprepitant (EMEND) 150 mg  dexamethasone (DECADRON) 12 mg in sodium chloride 0.9 % 145 mL IVPB, , Intravenous,  Once, 14 of 16 cycles. Administration:  (04/07/2018),  (06/02/2018),  (04/21/2018),  (05/05/2018),  (05/19/2018),  (06/09/2018),  (06/16/2018),  (06/23/2018),  (06/30/2018),  (07/07/2018),  (07/14/2018),  (07/21/2018),  (07/28/2018),  (08/04/2018)    04/27/2018 Genetic Testing   Negative genetic testing on the multi-cancer panel.  The Multi-Gene Panel offered by [facility] includes sequencing and/or deletion duplication testing of the following 84 genes: AIP, ALK, APC, ATM, AXIN2,BAP1,  BARD1, BLM, BMPR1A, BRCA1, BRCA2, BRIP1, CASR, CDC73, CDH1, CDK4, CDKN1B, CDKN1C, CDKN2A (p14ARF), CDKN2A (p16INK4a), CEBPA, CHEK2, CTNNA1, DICER1, DIS3L2, EGFR (c.2369C>T, p.Thr790Met variant only), EPCAM (Deletion/duplication testing only), FH, FLCN, GATA2, GPC3, GREM1 (Promoter region  deletion/duplication  testing only), HOXB13 (c.251G>A, p.Gly84Glu), HRAS, KIT, MAX, MEN1, MET, MITF (c.952G>A, p.Glu318Lys variant only), MLH1, MSH2, MSH3, MSH6, MUTYH, NBN, NF1, NF2, NTHL1, PALB2, PDGFRA, PHOX2B, PMS2, POLD1, POLE, POT1, PRKAR1A, PTCH1, PTEN, RAD50, RAD51C, RAD51D, RB1, RECQL4, RET, RUNX1, SDHAF2, SDHA (sequence changes only), SDHB, SDHC, SDHD, SMAD4, SMARCA4, SMARCB1, SMARCE1, STK11, SUFU, TERC, TERT, TMEM127, TP53, TSC1, TSC2, VHL, WRN and WT1.  The report date is April 27, 2018.   08/24/2018 Cancer Staging   Staging form: Breast, AJCC 8th Edition - Pathologic: No Stage Recommended (ypT1a, pN0, cM0, G2, ER-, PR-, HER2-) - Signed by Eppie Gibson, MD on 09/28/2018   08/30/2018 Surgery   Left mastectomy [name]) [id number]) revealed residual IDC, grade 2, negative margins. 12 axillary lymph nodes were negative for carcinoma. Triple negative, MIB-1 of 30%.   10/26/2018 - 11/22/2018 Radiation Therapy   The patient initially received a dose of 50.4 Gy in 28 fractions to the breast using whole-breast tangent fields. This was delivered using a 3-D conformal technique. The pt received a boost delivering an additional 10 Gy in 5 fractions using a electron boost with 81meV electrons. The total dose was 60.4 Gy.     CURRENT THERAPY: observation  INTERVAL HISTORY: Paula Berry 52 y.o. 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  She underwent right breast mammogram that demonstrated a 0.8 cm breast mass likely complicated cyst and ultrasound-guided breast aspiration and biopsy was recommended on July 14, 2022.  This occurred on July 28, 2022 and demonstrated fibrocystic change with usual ductal hyperplasia and was negative for carcinoma.  Repeat mammogram was recommended in February 2025.  Martene is doing moderately well today.  She is noted over the past 2 to 3 months that she has had increased lower back pain that  radiates down her right leg and sometimes into her groin.  She is unsure what this could be related to but notes that her weight has fluctuated and she continues to work on weight lifting and exercise.   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  MEDICAL HISTORY: Past Medical History:  Diagnosis Date   Anemia    had iron infusion 08/12/15   Asthma    Breast cancer ([place]) 03/2018   Left Breast Cancer   Cancer ([place])    Breast cancer    Genital herpes    GERD (gastroesophageal reflux disease)    Headache    migraines 2-3 times monthly   Personal history of chemotherapy    Personal history of radiation therapy    Pneumonia    hospitalizied for pneumonia as a child    SURGICAL HISTORY: Past Surgical History:  Procedure Laterality Date   ABDOMINAL HYSTERECTOMY Bilateral 08/22/2015   Procedure: HYSTERECTOMY ABDOMINAL, BILATERAL SALPINGECTOMY;  Surgeon: Everlene Farrier, MD;  Location: [facility];  Service: Gynecology;  Laterality: Bilateral;   BREAST BIOPSY Right 07/22/2022   [place] RT BREAST BX  W LOC DEV 1ST LESION IMG BX SPEC US GUIDE 07/22/2022 [facility] MAMMOGRAPHY   MASTECTOMY Left 07/2018   MASTECTOMY WITH AXILLARY LYMPH NODE DISSECTION Left 08/30/2018   Procedure: Left mastectomy with axillary lymph node dissection;  Surgeon: Rolm Bookbinder,  MD;  Location: [facility];  Service: General;  Laterality: Left;   MYOMECTOMY ABDOMINAL APPROACH     PORTACATH PLACEMENT N/A 03/30/2018   Procedure: INSERTION PORT-A-CATH WITH [place];  Surgeon: Rolm Bookbinder, MD;  Location: [facility];  Service: General;  Laterality: N/A;    SOCIAL HISTORY: Social History   Socioeconomic History   Marital status: Married    Spouse name: Ronnie   Number of children: 0   Years of education: 20   Highest education level: Not on file  Occupational History   Occupation: [employer]  Tobacco Use   Smoking status: Never   Smokeless tobacco: Never  Vaping Use   Vaping Use: Never used  Substance and Sexual Activity   Alcohol use: Yes    Alcohol/week: 1.0 standard drink of alcohol    Types: 1 Glasses of wine per week    Comment: daily   Drug use: No   Sexual activity: Not on file  Other Topics Concern   Not on file  Social History Narrative   Lives with spouse   Caffeine use: 1 cup coffee/day (<5 days per week)   No soda often, unless she has a headache (2x/month)   Tea- rarely    Social Determinants of Health   Financial Resource Strain: Not on file  Food Insecurity: Not on file  Transportation Needs: No Transportation Needs (09/27/2018)   PRAPARE - Hydrologist (Medical): No    Lack of Transportation (Non-Medical): No  Physical Activity: Not on file  Stress: Not on file  Social Connections: Not on file  Intimate Partner Violence: Unknown (09/27/2018)   Humiliation, Afraid, Rape, and Kick questionnaire    Fear of Current or Ex-Partner: No    Emotionally Abused: No    Physically Abused: Not on file    Sexually Abused: No    FAMILY HISTORY: Family History  Problem Relation Age of Onset   Hypertension Other    Diabetes Cousin        mat first cousin   Cancer Other    Dementia Maternal Grandfather    Lung cancer Paternal Grandmother        d. 59-60   Brain cancer Paternal Aunt         d. 32, father's mat 1/2 sister   Diabetes Maternal Grandmother    Other Paternal Grandfather        d. WWII   Aneurysm Maternal Uncle        brain   Migraines Neg Hx     Review of Systems  Constitutional:  Negative for appetite change, chills, fatigue, fever and unexpected weight change.  HENT:   Negative for hearing loss, lump/mass and trouble swallowing.   Eyes:  Negative for eye problems and icterus.  Respiratory:  Negative for chest tightness, cough and shortness of breath.   Cardiovascular:  Negative for chest pain, leg swelling and palpitations.  Gastrointestinal:  Negative for abdominal distention, abdominal pain, constipation, diarrhea, nausea and vomiting.  Endocrine: Negative for hot flashes.  Genitourinary:  Negative for difficulty urinating.   Musculoskeletal:  Positive for back pain. Negative for arthralgias.  Skin:  Negative for itching and rash.  Neurological:  Negative for dizziness, extremity weakness,  headaches and numbness.  Hematological:  Negative for adenopathy. Does not bruise/bleed easily.  Psychiatric/Behavioral:  Negative for depression. The patient is not nervous/anxious.       PHYSICAL EXAMINATION  ECOG PERFORMANCE STATUS: 1 - Symptomatic but completely ambulatory  Vitals:   08/24/22 0926  BP: 115/75  Pulse: 77  Resp: 18  Temp: (!) 97.4 F (36.3 C)  SpO2: 100%    Physical Exam Constitutional:      General: She is not in acute distress.    Appearance: Normal appearance. She is not toxic-appearing.  HENT:     Head: Normocephalic and atraumatic.  Eyes:     General: No scleral icterus. Cardiovascular:     Rate and Rhythm: Normal rate and regular rhythm.     Pulses: Normal pulses.     Heart sounds: Normal heart sounds.  Pulmonary:     Effort: Pulmonary effort is normal.     Breath sounds: Normal breath sounds.  Chest:     Comments: Right breast is benign left breast status postmastectomy and radiation no sign of local  recurrence. Abdominal:     General: Abdomen is flat. Bowel sounds are normal. There is no distension.     Palpations: Abdomen is soft.     Tenderness: There is no abdominal tenderness.  Musculoskeletal:        General: No swelling.     Cervical back: Neck supple.  Lymphadenopathy:     Cervical: No cervical adenopathy.  Skin:    General: Skin is warm and dry.     Findings: No rash.  Neurological:     General: No focal deficit present.     Mental Status: She is alert.  Psychiatric:        Mood and Affect: Mood normal.        Behavior: Behavior normal.     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    1. 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  Repeat mammogram due in February 2025.  2.  Lower back pain: Will obtain plain films today to further evaluate.  I recommended yoga to help with strengthening and stretching of her lower back.  We reviewed health maintenance and she will return in 6 months for follow-up with Dr. Lindi Adie in 1 year for follow-up with me.   All questions were answered. The patient knows to call the clinic with any problems, questions or concerns. We can certainly see the patient much sooner if necessary.  Total encounter time:30 minutes*in face-to-face visit time, chart review, lab review, care coordination, order entry, and documentation of the encounter time.    Wilber Bihari, NP 08/24/22 9:55 AM Medical Oncology and Hematology [facility] [place], [place] [address] Tel. [phone]    Fax. [phone]  *Total Encounter  Time as defined by the Centers for Medicare and Medicaid Services includes, in addition to the face-to-face time of a patient visit (documented in the note above) non-face-to-face time: obtaining and reviewing outside history, ordering and reviewing medications, tests or procedures, care coordination (communications with other health care professionals or caregivers) and documentation in the medical  record.

## 2022-08-25 ENCOUNTER — Telehealth: Payer: Self-pay | Admitting: Adult Health

## 2022-08-25 NOTE — Telephone Encounter (Signed)
Per 3/25 LOS reached out to patient to schedule; left voicemail.

## 2022-10-07 ENCOUNTER — Other Ambulatory Visit: Payer: Self-pay | Admitting: Internal Medicine

## 2022-10-07 DIAGNOSIS — J310 Chronic rhinitis: Secondary | ICD-10-CM

## 2022-10-07 DIAGNOSIS — L501 Idiopathic urticaria: Secondary | ICD-10-CM

## 2022-10-13 ENCOUNTER — Ambulatory Visit: Payer: BC Managed Care – PPO | Admitting: Family Medicine

## 2022-10-13 ENCOUNTER — Encounter: Payer: Self-pay | Admitting: Family Medicine

## 2022-10-13 VITALS — BP 106/70 | HR 88 | Temp 97.7°F | Resp 12

## 2022-10-13 DIAGNOSIS — J302 Other seasonal allergic rhinitis: Secondary | ICD-10-CM | POA: Diagnosis not present

## 2022-10-13 DIAGNOSIS — H1013 Acute atopic conjunctivitis, bilateral: Secondary | ICD-10-CM | POA: Diagnosis not present

## 2022-10-13 DIAGNOSIS — J4540 Moderate persistent asthma, uncomplicated: Secondary | ICD-10-CM

## 2022-10-13 DIAGNOSIS — L501 Idiopathic urticaria: Secondary | ICD-10-CM

## 2022-10-13 DIAGNOSIS — H6993 Unspecified Eustachian tube disorder, bilateral: Secondary | ICD-10-CM

## 2022-10-13 DIAGNOSIS — J310 Chronic rhinitis: Secondary | ICD-10-CM | POA: Insufficient documentation

## 2022-10-13 DIAGNOSIS — J3089 Other allergic rhinitis: Secondary | ICD-10-CM

## 2022-10-13 DIAGNOSIS — K219 Gastro-esophageal reflux disease without esophagitis: Secondary | ICD-10-CM

## 2022-10-13 DIAGNOSIS — H1010 Acute atopic conjunctivitis, unspecified eye: Secondary | ICD-10-CM

## 2022-10-13 MED ORDER — ALBUTEROL SULFATE HFA 108 (90 BASE) MCG/ACT IN AERS: 2.0000 | INHALATION_SPRAY | Freq: Four times a day (QID) | RESPIRATORY_TRACT | 2 refills | Status: AC | PRN

## 2022-10-13 MED ORDER — MONTELUKAST SODIUM 10 MG PO TABS: ORAL_TABLET | ORAL | 5 refills | Status: AC

## 2022-10-13 MED ORDER — PANTOPRAZOLE SODIUM 40 MG PO TBEC: 40.0000 mg | DELAYED_RELEASE_TABLET | Freq: Every evening | ORAL | 5 refills | Status: AC

## 2022-10-13 MED ORDER — BUDESONIDE-FORMOTEROL FUMARATE 160-4.5 MCG/ACT IN AERO: 2.0000 | INHALATION_SPRAY | Freq: Two times a day (BID) | RESPIRATORY_TRACT | 5 refills | Status: AC

## 2022-10-13 NOTE — Patient Instructions (Addendum)
Asthma/vocal cord dysfunction Continue montelukast 10 mg once a day to prevent cough or wheeze For now and for asthma flare, Increase Symbicort 160 to 2 puffs twice a day for 2 weeks or until you are cough and wheeze free Continue albuterol 2 puffs once every 4 hours as needed for cough or wheeze You may use albuterol 2 puffs 5 to 15 minutes before activity to decrease cough or wheeze  Allergic rhinitis Continue allergen avoidance measures directed toward pollen, mold, dust mite, and cat as listed below Hold Claritin 10 mg for about one week, then continue Claritin 10 mg once a day as needed for runny nose or itch. Remember to rotate to a different antihistamine about every 3 months. Some examples of over the counter antihistamines include Zyrtec (cetirizine), Xyzal (levocetirizine), Allegra (fexofenadine), and Claritin (loratidine).  Begin Flonase 2 sprays in each nostril once a day as needed for runny nose.  In the right nostril, point the applicator out toward the right ear. In the left nostril, point the applicator out toward the left ear Begin saline nasal rinses as needed for nasal symptoms. Use this before any medicated nasal sprays for best result For thick post nasal drainage, begin Mucinex [phone] mg twice a day and increase fluid intake as tolerated Consider allergen immunotherapy if your symptoms are not well-controlled with the treatment plan as listed above  Allergic conjunctivitis Some over the counter eye drops include Pataday one drop in each eye once a day as needed for red, itchy eyes OR Zaditor one drop in each eye twice a day as needed for red itchy eyes. Continue to use a lubricating eye drop as needed. Use this before the olopatadine to help wash away pollens. Remember to wash your eyelashes frequently as they can be a great place for pollens to hide You may keep your eye drops in the fridge for added relief of itch Avoid eye drops that say red eye relief as they may  contain medications that dry out your eyes.   Chronic urticaria Continue an antihistamine once a day as needed for hive control. You nay take an additional antihistamine once a day as needed for breakthrough symptoms.   Reflux Continue dietary and lifestyle modifications as listed below Continue pantoprazole as previously prescribed to control reflux. Take this medication 30 minutes before a meal for best results.   Call the clinic if this treatment plan is not working well for you.  Follow up in 2 months or sooner if needed.  Reducing Pollen Exposure The American Academy of Allergy, Asthma and Immunology suggests the following steps to reduce your exposure to pollen during allergy seasons. Do not hang sheets or clothing out to dry; pollen may collect on these items. Do not mow lawns or spend time around freshly cut grass; mowing stirs up pollen. Keep windows closed at night.  Keep car windows closed while driving. Minimize morning activities outdoors, a time when pollen counts are usually at their highest. Stay indoors as much as possible when pollen counts or humidity is high and on windy days when pollen tends to remain in the air longer. Use air conditioning when possible.  Many air conditioners have filters that trap the pollen spores. Use a HEPA room air filter to remove pollen form the indoor air you breathe.  Control of Mold Allergen Mold and fungi can grow on a variety of surfaces provided certain temperature and moisture conditions exist.  Outdoor molds grow on plants, decaying vegetation and soil.  The major outdoor mold, Alternaria and Cladosporium, are found in very high numbers during hot and dry conditions.  Generally, a late Summer - Fall peak is seen for common outdoor fungal spores.  Rain will temporarily lower outdoor mold spore count, but counts rise rapidly when the rainy period ends.  The most important indoor molds are Aspergillus and Penicillium.  Dark, humid and  poorly ventilated basements are ideal sites for mold growth.  The next most common sites of mold growth are the bathroom and the kitchen.  Outdoor [employer] Use air conditioning and keep windows closed Avoid exposure to decaying vegetation. Avoid leaf raking. Avoid grain handling. Consider wearing a face mask if working in moldy areas.  Indoor Mold Control Maintain humidity below 50%. Clean washable surfaces with 5% bleach solution. Remove sources e.g. Contaminated carpets.   Control of Dust Mite Allergen Dust mites play a major role in allergic asthma and rhinitis. They occur in environments with high humidity wherever human skin is found. Dust mites absorb humidity from the atmosphere (ie, they do not drink) and feed on organic matter (including shed human and animal skin). Dust mites are a microscopic type of insect that you cannot see with the naked eye. High levels of dust mites have been detected from mattresses, pillows, carpets, upholstered furniture, bed covers, clothes, soft toys and any woven material. The principal allergen of the dust mite is found in its feces. A gram of dust may contain 1,000 mites and 250,000 fecal particles. Mite antigen is easily measured in the air during house cleaning activities. Dust mites do not bite and do not cause harm to humans, other than by triggering allergies/asthma.  Ways to decrease your exposure to dust mites in your home:  1. Encase mattresses, box springs and pillows with a mite-impermeable barrier or cover  2. Wash sheets, blankets and drapes weekly in hot water (130 F) with detergent and dry them in a dryer on the hot setting.  3. Have the room cleaned frequently with a vacuum cleaner and a damp dust-mop. For carpeting or rugs, vacuuming with a vacuum cleaner equipped with a high-efficiency particulate air (HEPA) filter. The dust mite allergic individual should not be in a room which is being cleaned and should wait 1 hour after  cleaning before going into the room.  4. Do not sleep on upholstered furniture (eg, couches).  5. If possible removing carpeting, upholstered furniture and drapery from the home is ideal. Horizontal blinds should be eliminated in the rooms where the person spends the most time (bedroom, study, television room). Washable vinyl, roller-type shades are optimal.  6. Remove all non-washable stuffed toys from the bedroom. Wash stuffed toys weekly like sheets and blankets above.  7. Reduce indoor humidity to less than 50%. Inexpensive humidity monitors can be purchased at most hardware stores. Do not use a humidifier as can make the problem worse and are not recommended.  Control of Dog or Cat Allergen Avoidance is the best way to manage a dog or cat allergy. If you have a dog or cat and are allergic to dog or cats, consider removing the dog or cat from the home. If you have a dog or cat but don't want to find it a new home, or if your family wants a pet even though someone in the household is allergic, here are some strategies that may help keep symptoms at bay:  Keep the pet out of your bedroom and restrict it to only a few  rooms. Be advised that keeping the dog or cat in only one room will not limit the allergens to that room. Don't pet, hug or kiss the dog or cat; if you do, wash your hands with soap and water. High-efficiency particulate air (HEPA) cleaners run continuously in a bedroom or living room can reduce allergen levels over time. Regular use of a high-efficiency vacuum cleaner or a central vacuum can reduce allergen levels. Giving your dog or cat a bath at least once a week can reduce airborne allergen.

## 2022-10-13 NOTE — Progress Notes (Cosign Needed Addendum)
[address] [place] [address] Dept: [phone]  FOLLOW UP NOTE  Patient ID: Paula Berry, female    DOB: Oct 16, 1970  Age: 52 y.o. MRN: [id number] Date of Office Visit: 10/13/2022  Assessment  Chief Complaint: Allergic Rhinitis  (Constant itchy throat,eyes,ears. Feels like her right ear is swimming. Spot is right ear is constantly itchy even when her allergies are not flaring up) and Asthma (Always having a dry cough and making her voice feel hoarse.)  HPI Paula Berry is a 52 year old female who presents to the clinic for follow-up visit.  She was last seen in this clinic on 02/24/2021 by Dr. Maurine Minister for evaluation of asthma versus VCD, seasonal and perennial allergic rhinitis, allergic conjunctivitis, chronic urticaria, and possible food allergy.    At today's visit, she reports that she began to experience symptoms of allergic rhinitis that began about 2 months ago and worsened over the last several days.  Her current symptoms include clear rhinorrhea, nasal congestion, sneezing, and postnasal drainage with frequent throat clearing.  She continues Claritin 10 mg once a day and is not currently using a nasal saline rinse or nasal steroid spray.  She continues montelukast 10 mg once a day.  She reports her primary care provider has been refilling montelukast.  Her last environmental allergy skin testing was on 02/24/2021 and was positive to grass pollen, tree pollen, mold, dust mite, and cat.    Allergic conjunctivitis is reported as moderately well-controlled with red and itchy eyes for which she continues olopatadine with moderate relief of symptoms.  Asthma is reported as moderately well-controlled with cough producing clear mucus over the last 2 weeks as the main symptom.  She reports this cough occurs in the daytime and nighttime equally.  She denies shortness of breath or wheeze with activity or rest.  She has recently restarted Symbicort 160-2 puffs  once a day and has used albuterol 2-3 times a day over the last 2 weeks.  She continues montelukast 10 mg once a day.  She reports that she usually uses Symbicort 160 as needed which is once every couple of weeks with the exception of the last 2 weeks when she has used this daily.  Reflux is reported as moderately well-controlled with occasional heartburn as the main symptom.  She denies vomiting.  She continues pantoprazole or cimetidine daily.  She has not regularly taking pantoprazole before a meal. Her last food allergy testing was negative on 02/24/2021.  She reports that she has started to experience pressure, popping noises, and intermittent slightly muffled hearing.  She denies dizziness or drainage from either ear.  Her current medications are listed in the chart.  Drug Allergies:  Allergies  Allergen Reactions   Neosporin [Neomycin-Bacitracin Zn-Polymyx] Other (See Comments)    Decreased wound healing with blisters   Bacitracin-Polymyxin B Other (See Comments)   Cat Hair Extract Hives   Other Other (See Comments)    Physical Exam: BP 106/70 (BP Location: Right Arm, Patient Position: Sitting, Cuff Size: Normal)   Pulse 88   Temp 97.7 F (36.5 C) (Temporal)   Resp 12   SpO2 98%    Physical Exam Vitals reviewed.  Constitutional:      Appearance: Normal appearance.  HENT:     Head: Normocephalic and atraumatic.     Right Ear: Tympanic membrane normal.     Left Ear: Tympanic membrane normal.     Nose:     Comments: Bilateral naris edematous and  pale with thin clear nasal drainage.  Pharynx slightly erythematous with no exudate.  Bilateral ears with clear effusion.  Eyes normal. Eyes:     Conjunctiva/sclera: Conjunctivae normal.  Cardiovascular:     Rate and Rhythm: Normal rate and regular rhythm.     Heart sounds: Normal heart sounds. No murmur heard. Pulmonary:     Effort: Pulmonary effort is normal.     Breath sounds: Normal breath sounds.     Comments: Lungs clear  to auscultation Musculoskeletal:        General: Normal range of motion.     Cervical back: Normal range of motion and neck supple.  Skin:    General: Skin is warm and dry.  Neurological:     Mental Status: She is alert and oriented to person, place, and time.  Psychiatric:        Mood and Affect: Mood normal.        Behavior: Behavior normal.        Thought Content: Thought content normal.        Judgment: Judgment normal.     Diagnostics: FVC 2.36, FEV1 1.88.  Predicted FVC 3.48, predicted FEV1 2.77.  Spirometry indicates moderate restriction.  This is consistent with previous spirometry readings  Assessment and Plan: 1. Not well controlled moderate persistent asthma   2. Seasonal and perennial allergic rhinitis   3. Seasonal allergic conjunctivitis   4. Chronic idiopathic urticaria   5. Dysfunction of both eustachian tubes   6. Gastroesophageal reflux disease, unspecified whether esophagitis present     Meds ordered this encounter  Medications   pantoprazole (PROTONIX) 40 MG tablet    Sig: Take 1 tablet (40 mg total) by mouth every evening.    Dispense:  30 tablet    Refill:  5   montelukast (SINGULAIR) 10 MG tablet    Sig: TAKE 1 TABLET(10 MG) BY MOUTH EVERY EVENING    Dispense:  30 tablet    Refill:  5   albuterol (VENTOLIN HFA) 108 (90 Base) MCG/ACT inhaler    Sig: Inhale 2 puffs into the lungs every 6 (six) hours as needed for wheezing or shortness of breath.    Dispense:  1 each    Refill:  2   budesonide-formoterol (SYMBICORT) 160-4.5 MCG/ACT inhaler    Sig: Inhale 2 puffs into the lungs 2 (two) times daily.    Dispense:  1 each    Refill:  5    Patient Instructions  Asthma/vocal cord dysfunction Continue montelukast 10 mg once a day to prevent cough or wheeze For now and for asthma flare, Increase Symbicort 160 to 2 puffs twice a day for 2 weeks or until you are cough and wheeze free Continue albuterol 2 puffs once every 4 hours as needed for cough or  wheeze You may use albuterol 2 puffs 5 to 15 minutes before activity to decrease cough or wheeze  Allergic rhinitis Continue allergen avoidance measures directed toward pollen, mold, dust mite, and cat as listed below Hold Claritin 10 mg for about one week, then continue Claritin 10 mg once a day as needed for runny nose or itch. Remember to rotate to a different antihistamine about every 3 months. Some examples of over the counter antihistamines include Zyrtec (cetirizine), Xyzal (levocetirizine), Allegra (fexofenadine), and Claritin (loratidine).  Begin Flonase 2 sprays in each nostril once a day as needed for runny nose.  In the right nostril, point the applicator out toward the right ear. In the left nostril,  point the applicator out toward the left ear Begin saline nasal rinses as needed for nasal symptoms. Use this before any medicated nasal sprays for best result For thick post nasal drainage, begin Mucinex [phone] mg twice a day and increase fluid intake as tolerated Consider allergen immunotherapy if your symptoms are not well-controlled with the treatment plan as listed above  Allergic conjunctivitis Some over the counter eye drops include Pataday one drop in each eye once a day as needed for red, itchy eyes OR Zaditor one drop in each eye twice a day as needed for red itchy eyes. Continue to use a lubricating eye drop as needed. Use this before the olopatadine to help wash away pollens. Remember to wash your eyelashes frequently as they can be a great place for pollens to hide You may keep your eye drops in the fridge for added relief of itch Avoid eye drops that say red eye relief as they may contain medications that dry out your eyes.   Chronic urticaria Continue an antihistamine once a day as needed for hive control. You nay take an additional antihistamine once a day as needed for breakthrough symptoms.   Reflux Continue dietary and lifestyle modifications as listed  below Continue pantoprazole as previously prescribed to control reflux. Take this medication 30 minutes before a meal for best results.   Call the clinic if this treatment plan is not working well for you.  Follow up in 2 months or sooner if needed.   Return in about 2 months (around 12/13/2022), or if symptoms worsen or fail to improve.    Thank you for the opportunity to care for this patient.  Please do not hesitate to contact me with questions.  Thermon Leyland, FNP [facility]

## 2022-11-23 ENCOUNTER — Other Ambulatory Visit: Payer: Self-pay | Admitting: Adult Health

## 2022-11-23 ENCOUNTER — Ambulatory Visit (HOSPITAL_BASED_OUTPATIENT_CLINIC_OR_DEPARTMENT_OTHER)
Admission: RE | Admit: 2022-11-23 | Discharge: 2022-11-23 | Disposition: A | Discharge status: Home / Self Care | Payer: BC Managed Care – PPO | Source: Ambulatory Visit | Attending: Adult Health | Admitting: Adult Health

## 2022-11-23 DIAGNOSIS — Z171 Estrogen receptor negative status [ER-]: Secondary | ICD-10-CM

## 2022-11-23 DIAGNOSIS — M5441 Lumbago with sciatica, right side: Secondary | ICD-10-CM

## 2022-11-27 ENCOUNTER — Telehealth: Payer: Self-pay | Admitting: *Deleted

## 2022-11-27 NOTE — Telephone Encounter (Signed)
-----   Message from Loa Socks, NP sent at 11/27/2022  8:20 AM EDT ----- Please let patient know that lower back pain is not cancer related.  Would recommend PT or seeing sports medicine  ----- Message ----- From: Interface, Rad Results In Sent: 11/27/2022   7:47 AM EDT To: Loa Socks, NP

## 2022-11-27 NOTE — Telephone Encounter (Signed)
Per Lillard Anes, DNP, called pt with message below. Left vmail. Advised to call office if referral is needed.

## 2022-11-30 ENCOUNTER — Telehealth: Payer: Self-pay

## 2022-11-30 NOTE — Telephone Encounter (Signed)
Attempted to call pt per NP to review results, assess pain and give recommendation. LVM for call back.

## 2022-11-30 NOTE — Telephone Encounter (Signed)
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  Can refer to sports medicine if it is still bothering her ----- Message ----- From: Interface, Rad Results In Sent: 11/27/2022   5:09 PM EDT To: Loa Socks, NP

## 2022-12-02 ENCOUNTER — Telehealth: Payer: Self-pay

## 2022-12-02 NOTE — Telephone Encounter (Signed)
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  Can refer to sports medicine if it is still bothering her ----- Message ----- From: Interface, Rad Results In Sent: 11/27/2022   5:09 PM EDT To: Loa Socks, NP

## 2022-12-02 NOTE — Telephone Encounter (Signed)
Attempted to call pt once again per NP. LVM for call back.

## 2022-12-04 ENCOUNTER — Telehealth: Payer: Self-pay

## 2022-12-04 ENCOUNTER — Other Ambulatory Visit: Payer: Self-pay

## 2022-12-04 DIAGNOSIS — Z171 Estrogen receptor negative status [ER-]: Secondary | ICD-10-CM

## 2022-12-04 DIAGNOSIS — M5441 Lumbago with sciatica, right side: Secondary | ICD-10-CM

## 2022-12-04 NOTE — Telephone Encounter (Signed)
Pt returned call regarding right hip imaging results. She reports she still feels right hip pain at least twice a day which leads to numbness and tingling to her RLE. Per NP, order entered for sports medicine Dr Jean Rosenthal. Pt was provided office phone number to call and schedule.

## 2022-12-16 NOTE — Progress Notes (Signed)
Paula Berry D.[facility] [address] [place] [address] Phone: [phone]   Assessment and Plan:     1. Chronic right-sided low back pain without sciatica -Chronic with exacerbation, initial sports medicine visit - Most consistent with low back pain leading to compensatory pain and left posterior hip with underlying levoscoliosis of the lumbar spine - Recommend starting HEP and physical therapy.  Long-term goal of increasing core and low back strength to decrease flares of low back pain - Start meloxicam 15 mg daily x2 weeks.  If still having pain after 2 weeks, complete 3rd-week of meloxicam. May use remaining meloxicam as needed once daily for pain control.  Do not to use additional NSAIDs while taking meloxicam.  May use Tylenol [phone] mg 2 to 3 times a day for breakthrough pain. - Reviewed patient's x-rays including lumbar and hip x-rays which showed levoscoliosis of lumbar spine, anterolisthesis L4 on L5 and mild right hip degenerative changes   Pertinent previous records reviewed include right hip x-ray 11/23/2022, lumbar x-ray 11/19/2022,   Follow Up: 4 weeks for reevaluation.  If no improvement or worsening of symptoms, could consider advanced imaging versus OMT versus prednisone course   Subjective:   I, Paula Berry, am serving as a Neurosurgeon for Doctor Richardean Sale  Chief Complaint: low back pain   HPI:   12/17/2022 Patient is a 52 year old female complaining of low back pain. Patient states she has low back pain that radiates to the front of her hip. Pain for about 8 months . Pain when sitting , working out, driving , and pain when standing or walking. March her leg started going numb. Tylenol arthritis and aleve and that didn't help a lot . Pain doesn't stay along time it comes and goes in 5-10 minute incriminates.    Relevant Historical Information: GERD  Additional pertinent review of systems negative.   Current  Outpatient Medications:    acetaminophen (TYLENOL) 500 MG tablet, Take 1,000 mg by mouth every 8 (eight) hours as needed for moderate pain., Disp: , Rfl:    albuterol (VENTOLIN HFA) 108 (90 Base) MCG/ACT inhaler, Inhale 2 puffs into the lungs every 6 (six) hours as needed for wheezing or shortness of breath., Disp: 1 each, Rfl: 2   budesonide-formoterol (SYMBICORT) 160-4.5 MCG/ACT inhaler, Inhale 2 puffs into the lungs 2 (two) times daily., Disp: 1 each, Rfl: 5   cimetidine (TAGAMET) 400 MG tablet, Take 400 mg by mouth 3 (three) times daily., Disp: , Rfl:    EPINEPHrine 0.3 mg/0.3 mL IJ SOAJ injection, Inject 0.3 mg into the muscle once., Disp: , Rfl:    hydrOXYzine (VISTARIL) 25 MG capsule, Take 25-50 mg by mouth at bedtime., Disp: , Rfl:    loratadine (CLARITIN) 10 MG tablet, TAKE 1 TABLET BY MOUTH TWICE DAILY FOR HIVES, CAN INCREASE TO MAXIMUM DOSE OF 2 TABLETS TWICE DAILY IF NEEDED, Disp: 30 tablet, Rfl: 0   meloxicam (MOBIC) 15 MG tablet, Take 1 tablet (15 mg total) by mouth daily., Disp: 30 tablet, Rfl: 0   montelukast (SINGULAIR) 10 MG tablet, TAKE 1 TABLET(10 MG) BY MOUTH EVERY EVENING, Disp: 30 tablet, Rfl: 5   pantoprazole (PROTONIX) 40 MG tablet, Take 1 tablet (40 mg total) by mouth every evening., Disp: 30 tablet, Rfl: 5   valACYclovir (VALTREX) 500 MG tablet, Take 500 mg by mouth See admin instructions. Take 500 mg twice daily for 3 days at onset of outbreak then take 500 mg  once daily until clear, Disp: , Rfl:    Objective:     Vitals:   12/17/22 1336  BP: 120/80  Pulse: 80  SpO2: 96%  Weight: 225 lb (102.1 kg)  Height: 5\' 9"  (1.753 m)      Body mass index is 33.23 kg/m.    Physical Exam:    Gen: Appears well, nad, nontoxic and pleasant Psych: Alert and oriented, appropriate mood and affect Neuro: sensation intact, strength is 5/5 in upper and lower extremities, muscle tone wnl Skin: no susupicious lesions or rashes  Back - Normal skin,   and no deformity.  Mild  levoscoliosis of lumbar spine   tenderness to L3-L5 vertebral process palpation.   Lumbar paraspinous muscles are   tender and without spasm NTTP gluteal musculature Straight leg raise negative for radicular pain, though reproduced low back pain on right Trendelenberg negative Piriformis Test negative for radicular symptoms, though reproduced low back pain on right Negative logroll, FABER and FADIR on right   Electronically signed by:  Paula Berry D.[facility] 2:08 PM 12/17/22

## 2022-12-17 ENCOUNTER — Ambulatory Visit: Payer: BC Managed Care – PPO | Admitting: Sports Medicine

## 2022-12-17 VITALS — BP 120/80 | HR 80 | Ht 69.0 in | Wt 225.0 lb

## 2022-12-17 DIAGNOSIS — G8929 Other chronic pain: Secondary | ICD-10-CM | POA: Diagnosis not present

## 2022-12-17 DIAGNOSIS — M545 Low back pain, unspecified: Secondary | ICD-10-CM | POA: Diagnosis not present

## 2022-12-17 MED ORDER — MELOXICAM 15 MG PO TABS: 15.0000 mg | ORAL_TABLET | Freq: Every day | ORAL | 0 refills | Status: DC

## 2022-12-17 NOTE — Patient Instructions (Addendum)
Low back HEP - Start meloxicam 15 mg daily x2 weeks.  If still having pain after 2 weeks, complete 3rd-week of meloxicam. May use remaining meloxicam as needed once daily for pain control.  Do not to use additional NSAIDs while taking meloxicam.  May use Tylenol [phone] mg 2 to 3 times a day for breakthrough pain. PT referral  4 week follow up

## 2023-01-07 ENCOUNTER — Ambulatory Visit: Payer: BC Managed Care – PPO | Admitting: Physical Therapy

## 2023-01-13 ENCOUNTER — Other Ambulatory Visit: Payer: Self-pay | Admitting: Sports Medicine

## 2023-01-15 NOTE — Progress Notes (Deleted)
    Paula [facility] D.[name] [address] [place] [address] Phone: [phone]   Assessment and Plan:     There are no diagnoses linked to this encounter.  ***   Pertinent previous records reviewed include ***   Follow Up: ***     Subjective:   I, Debbe Odea, am serving as a scribe for Dr. Richardean Sale   Chief Complaint: low back pain    HPI:    12/17/2022 Patient is a 52 year old female complaining of low back pain. Patient states she has low back pain that radiates to the front of her hip. Pain for about 8 months . Pain when sitting , working out, driving , and pain when standing or walking. March her leg started going numb. Tylenol arthritis and aleve and that didn't help a lot . Pain doesn't stay along time it comes and goes in 5-10 minute incriminates.    01/21/2023 Patient states   Relevant Historical Information: GERD  Additional pertinent review of systems negative.   Current Outpatient Medications:    acetaminophen (TYLENOL) 500 MG tablet, Take 1,000 mg by mouth every 8 (eight) hours as needed for moderate pain., Disp: , Rfl:    albuterol (VENTOLIN HFA) 108 (90 Base) MCG/ACT inhaler, Inhale 2 puffs into the lungs every 6 (six) hours as needed for wheezing or shortness of breath., Disp: 1 each, Rfl: 2   budesonide-formoterol (SYMBICORT) 160-4.5 MCG/ACT inhaler, Inhale 2 puffs into the lungs 2 (two) times daily., Disp: 1 each, Rfl: 5   cimetidine (TAGAMET) 400 MG tablet, Take 400 mg by mouth 3 (three) times daily., Disp: , Rfl:    EPINEPHrine 0.3 mg/0.3 mL IJ SOAJ injection, Inject 0.3 mg into the muscle once., Disp: , Rfl:    hydrOXYzine (VISTARIL) 25 MG capsule, Take 25-50 mg by mouth at bedtime., Disp: , Rfl:    loratadine (CLARITIN) 10 MG tablet, TAKE 1 TABLET BY MOUTH TWICE DAILY FOR HIVES, CAN INCREASE TO MAXIMUM DOSE OF 2 TABLETS TWICE DAILY IF NEEDED, Disp: 30 tablet, Rfl: 0   meloxicam (MOBIC) 15 MG tablet,  Take 1 tablet (15 mg total) by mouth daily., Disp: 30 tablet, Rfl: 0   montelukast (SINGULAIR) 10 MG tablet, TAKE 1 TABLET(10 MG) BY MOUTH EVERY EVENING, Disp: 30 tablet, Rfl: 5   pantoprazole (PROTONIX) 40 MG tablet, Take 1 tablet (40 mg total) by mouth every evening., Disp: 30 tablet, Rfl: 5   valACYclovir (VALTREX) 500 MG tablet, Take 500 mg by mouth See admin instructions. Take 500 mg twice daily for 3 days at onset of outbreak then take 500 mg once daily until clear, Disp: , Rfl:    Objective:     There were no vitals filed for this visit.    There is no height or weight on file to calculate BMI.    Physical Exam:    ***   Electronically signed by:  Paula Berry D.[name] 7:41 AM 01/15/23

## 2023-01-21 ENCOUNTER — Ambulatory Visit: Payer: BC Managed Care – PPO | Admitting: Sports Medicine

## 2023-01-29 ENCOUNTER — Telehealth: Payer: Self-pay | Admitting: Hematology and Oncology

## 2023-01-29 NOTE — Telephone Encounter (Signed)
Rescheduled appointment per [facility]. Left voicemail with appointment details.

## 2023-02-10 ENCOUNTER — Ambulatory Visit: Payer: BC Managed Care – PPO | Admitting: Hematology and Oncology

## 2023-02-10 NOTE — Therapy (Addendum)
OUTPATIENT PHYSICAL THERAPY THORACOLUMBAR EVALUATION  PHYSICAL THERAPY DISCHARGE SUMMARY  Visits from Start of Care: 1  Current functional level related to goals / functional outcomes: Could not Reassess due to unplanned Discharge   Remaining deficits: Could not Reassess due to unplanned Discharge   Education / Equipment: Could not Reassess due to unplanned Discharge   Patient agrees to discharge. Patient goals were not met. Patient is being discharged due to not returning since the last visit.  10:08 AM, 05/12/23 Tereasa Coop, DPT Physical Therapy with [place]   Patient Name: Paula Berry MRN: [id number] DOB:1970-12-02, 52 y.o., female Today's Date: 02/17/2023  END OF SESSION:  PT End of Session - 02/17/23 1428     Visit Number 1    Number of Visits 12    Date for PT Re-Evaluation 05/12/23    Authorization Type BCBS    PT Start Time 1430    PT Stop Time 1515    PT Time Calculation (min) 45 min    Activity Tolerance Patient tolerated treatment well    Behavior During Therapy [facility] for tasks assessed/performed             Past Medical History:  Diagnosis Date   Anemia    had iron infusion 08/12/15   Asthma    Breast cancer (HCC) 03/2018   Left Breast Cancer   Cancer (HCC)    Breast cancer    Genital herpes    GERD (gastroesophageal reflux disease)    Headache    migraines 2-3 times monthly   Personal history of chemotherapy    Personal history of radiation therapy    Pneumonia    hospitalizied for pneumonia as a child   Past Surgical History:  Procedure Laterality Date   ABDOMINAL HYSTERECTOMY Bilateral 08/22/2015   Procedure: HYSTERECTOMY ABDOMINAL, BILATERAL SALPINGECTOMY;  Surgeon: Harold Hedge, MD;  Location: [facility];  Service: Gynecology;  Laterality: Bilateral;   BREAST BIOPSY Right 07/22/2022   [place] RT BREAST BX W LOC DEV 1ST LESION IMG BX SPEC US GUIDE 07/22/2022 [facility]   MASTECTOMY Left 07/2018   MASTECTOMY WITH  AXILLARY LYMPH NODE DISSECTION Left 08/30/2018   Procedure: Left mastectomy with axillary lymph node dissection;  Surgeon: Emelia Loron, MD;  Location: [facility];  Service: General;  Laterality: Left;   MYOMECTOMY ABDOMINAL APPROACH     PORTACATH PLACEMENT N/A 03/30/2018   Procedure: INSERTION PORT-A-CATH WITH [place];  Surgeon: Emelia Loron, MD;  Location: [facility] OR;  Service: General;  Laterality: N/A;   Patient Active Problem List   Diagnosis Date Noted   Chronic rhinitis 10/13/2022   Seasonal allergic conjunctivitis 10/13/2022   Dysfunction of both eustachian tubes 10/13/2022   Obesity 08/21/2021   Gastroesophageal reflux disease 08/21/2021   Seasonal and perennial allergic rhinitis 02/24/2021   Chronic idiopathic urticaria 02/24/2021   Moderate persistent asthma without complication 02/24/2021   S/P mastectomy, left 08/30/2018   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   Bronchiectasis (HCC) 07/21/2018   Genetic testing 05/03/2018   Malignant neoplasm of upper-outer quadrant of left breast in female, estrogen receptor negative (HCC) 03/21/2018   Migraine 11/21/2015   Fibroids 08/22/2015    PCP: Ileana Ladd, MD  REFERRING PROVIDER: Richardean Sale, DO  REFERRING DIAG: M54.50 (ICD-10-CM) - Low back pain, unspecified back pain laterality, unspecified chronicity, unspecified whether sciatica present   Rationale for Evaluation and Treatment: Rehabilitation  THERAPY DIAG:  Other low back pain  Muscle weakness (generalized)  Pain in right hip  ONSET  DATE: 1 year ago  SUBJECTIVE:                                                                                                                                                                                           SUBJECTIVE STATEMENT: States pain cam on gradual over the last year. States that she felt it coming out of the car from leaving the gym. States the pain was real bad and she could barely get out of the  car. States about 8 months ago the pain would radiate around her hip (front or back) and in her leg. States that she gets numbness in her medial lower leg. Pain is primarily in her hip. States she can stand to brush her teeth because the pain is so excruciating. States she has to off load her hip to make it feel better. Advil helps a little but not a lot.   States she stretches her hips out to the side and book stretching. States when she works out she cycles, aerobic steps and zumba and her pain limits her ability to participate in these workout classes.  States she sets a timer to move every 45 minutes if she is sitting at her desk for long periods of time and this helps a little.   PERTINENT HISTORY:  left breast cancer 2019, left masectomy 2020, hysterectomy 2017, asthma, right achille's  tendon right - controlled  PAIN:  Are you having pain? Yes: NPRS scale: 15/10 Pain location: right hip front and back Pain description: sharp, shooting,throbbing, annoyance when not throbbing Aggravating factors: standing, equal weight on both legs Relieving factors: off loading hip, sitting , meloxicam, heat  PRECAUTIONS: None  RED FLAGS: None   WEIGHT BEARING RESTRICTIONS: No  FALLS:  Has patient fallen in last 6 months? 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   PLOF: Independent  PATIENT GOALS: too have less pain    OBJECTIVE:   DIAGNOSTIC FINDINGS:  LUMBAR XRAY 11/27/22 FINDINGS: Curvature of the lumbar spine, apex to the left. Trace anterolisthesis of L4 versus L5. No other malalignment. No fractures. No significant degenerative disc disease. No other significant abnormalities.   IMPRESSION: 1. Curvature of the lumbar spine, apex to the left. 2. Trace anterolisthesis of L4 versus L5. 3. No other significant abnormalities.  R HIP XRAY 11/27/22 COMPARISON:  None Available.   FINDINGS: Pelvis is intact with normal and  symmetric sacroiliac joints.   No acute fracture or dislocation.   No aggressive osseous lesion.   Visualized sacral arcuate lines are unremarkable.   Unremarkable symphysis  pubis.   There are mild degenerative changes of bilateral hip joints without significant joint space narrowing. Osteophytosis of the superior acetabulum.   No radiopaque foreign bodies.   IMPRESSION: 1. Mild degenerative change of right hip joint.    SCREENING FOR RED FLAGS: Bowel or bladder incontinence: No Spinal tumors: No Cauda equina syndrome: No Compression fracture: No Abdominal aneurysm: No  COGNITION: Overall cognitive status: Within functional limits for tasks assessed     SENSATION: Not tested    POSTURE: rounded shoulders, increased lumbar lordosis, and flexed trunk   PALPATION: Increased resting tone in right glutes, adductors and lumbar paraspinals and tenderness to these areas  LUMBAR ROM:   AROM eval  Flexion WFL *  Extension WFL  Right lateral flexion 25% limited*  Left lateral flexion 25% limited *  Right rotation   Left rotation    (Blank rows = not tested) *pulling    LE Measurements Lower Extremity Right EVAL Left EVAL   A/PROM MMT A/PROM MMT  Hip Flexion [facility]  WFL   Hip Extension  3+  4  Hip Abduction      Hip Adduction      Hip Internal rotation 20  WFL   Hip External rotation 40  WFL   Knee Flexion      Knee Extension      Ankle Dorsiflexion      Ankle Plantarflexion      Ankle Inversion      Ankle Eversion       (Blank rows = not tested) * pain Right leg not stretching like right just muscle guarding and block feeling in hip    LUMBAR SPECIAL TESTS:  Straight leg raise test: Negative, Slump test: Negative, and leg length test right equal to longer   FUNCTIONAL TESTS:  Holds her breath with supine to sit, pain with bridges   TODAY'S TREATMENT:                                                                                                                               DATE:   02/17/2023  Therapeutic Exercise:  Aerobic: Supine: hip add iso and hip abd iso ball and belt 6 minutes total, SIJ isometrics push pull (right into extension Prone:  Seated:  Standing: Neuromuscular Re-education: Manual Therapy: Therapeutic Activity: Self Care: Trigger Point Dry Needling:  Modalities:    PATIENT EDUCATION:  Education details: on current presentation, on HEP, on clinical outcomes score and POC, on anatomy, pelvic floor dysfunction and relation to hip/back Person educated: Patient Education method: Programmer, multimedia, Demonstration, and Handouts Education comprehension: verbalized understanding   HOME EXERCISE PROGRAM: [id number]  ASSESSMENT:  CLINICAL IMPRESSION: Patient presents to therapy with complaints of right leg hip and leg pain that has been going on over the last year. Symptoms consistent with pelvic floor and hip muscle dysfunction and educated patient in findings as well as rehab moving forward. Tolerated SIJ isometrics well and will progress as  able. Patient would greatly benefit from skilled PT to improve overall function and QOL.    OBJECTIVE IMPAIRMENTS: decreased activity tolerance, difficulty walking, decreased ROM, decreased strength, increased muscle spasms, improper body mechanics, postural dysfunction, and pain.   ACTIVITY LIMITATIONS: bending, sitting, standing, sleeping, transfers, and locomotion level  PARTICIPATION LIMITATIONS: driving, community activity, and occupation  PERSONAL FACTORS: Fitness, Time since onset of injury/illness/exacerbation, and 1 comorbidity: hysterectomy  are also affecting patient's functional outcome.   REHAB POTENTIAL: Good  CLINICAL DECISION MAKING: Stable/uncomplicated  EVALUATION COMPLEXITY: Low   GOALS: Goals reviewed with patient? yes  SHORT TERM GOALS: Target date: 03/31/2023  Patient will be independent in self management strategies to improve quality of life and  functional outcomes. Baseline: New Program Goal status: INITIAL  2.  Patient will report at least 50% improvement in overall symptoms and/or function to demonstrate improved functional mobility Baseline: 0% better Goal status: INITIAL  3.  Patient will be able to perform bed mobilities without pain (bridge painful) and without holding her breath.  Baseline: painful Goal status: INITIAL     LONG TERM GOALS: Target date: 05/12/2023   Patient will report at least 75% improvement in overall symptoms and/or function to demonstrate improved functional mobility Baseline: 0% better Goal status: INITIAL  2.  Patient will be able to get out of the car after the gym without severe hip/leg pain. Baseline: unable/painful Goal status: INITIAL  3.  Patient will be able to go up and down steps without pain to improve ability to perform step classes.  Baseline: painful Goal status: INITIAL   PLAN:  PT FREQUENCY: 1-2x/week  PT DURATION: 12 weeks  PLANNED INTERVENTIONS: Therapeutic exercises, Therapeutic activity, Neuromuscular re-education, Balance training, Gait training, Patient/Family education, Self Care, Joint mobilization, Joint manipulation, Stair training, Vestibular training, Canalith repositioning, Orthotic/Fit training, Prosthetic training, DME instructions, Aquatic Therapy, Dry Needling, Electrical stimulation, Spinal manipulation, Spinal mobilization, Cryotherapy, Moist heat, Taping, Traction, Ultrasound, Ionotophoresis 4mg /ml Dexamethasone, Manual therapy, and Re-evaluation.   PLAN FOR NEXT SESSION: SIJ isometrics, STM/percussion gun, long exhale/core activation, hip ROM and muscle activation, pelvic tilts    4:04 PM, 02/17/23 Tereasa Coop, DPT Physical Therapy with [facility]

## 2023-02-10 NOTE — Progress Notes (Deleted)
    [facility]ula [facility] [address] [place] [address] Phone: [phone]   Assessment and Plan:     There are no diagnoses linked to this encounter.  ***   Pertinent previous records reviewed include ***   Follow Up: ***     Subjective:   I, Paula Berry, am serving as a Neurosurgeon for Paula Berry   Chief Complaint: low back pain    HPI:    12/17/2022 Patient is a 52 year old female complaining of low back pain. Patient states she has low back pain that radiates to the front of her hip. Pain for about 8 months . Pain when sitting , working out, driving , and pain when standing or walking. March her leg started going numb. Tylenol arthritis and aleve and that didn't help a lot . Pain doesn't stay along time it comes and goes in 5-10 minute incriminates.    02/11/2023 Patient states   Relevant Historical Information: GERD  Additional pertinent review of systems negative.   Current Outpatient Medications:    acetaminophen (TYLENOL) 500 MG tablet, Take 1,000 mg by mouth every 8 (eight) hours as needed for moderate pain., Disp: , Rfl:    albuterol (VENTOLIN HFA) 108 (90 Base) MCG/ACT inhaler, Inhale 2 puffs into the lungs every 6 (six) hours as needed for wheezing or shortness of breath., Disp: 1 each, Rfl: 2   budesonide-formoterol (SYMBICORT) 160-4.5 MCG/ACT inhaler, Inhale 2 puffs into the lungs 2 (two) times daily., Disp: 1 each, Rfl: 5   cimetidine (TAGAMET) 400 MG tablet, Take 400 mg by mouth 3 (three) times daily., Disp: , Rfl:    EPINEPHrine 0.3 mg/0.3 mL IJ SOAJ injection, Inject 0.3 mg into the muscle once., Disp: , Rfl:    hydrOXYzine (VISTARIL) 25 MG capsule, Take 25-50 mg by mouth at bedtime., Disp: , Rfl:    loratadine (CLARITIN) 10 MG tablet, TAKE 1 TABLET BY MOUTH TWICE DAILY FOR HIVES, CAN INCREASE TO MAXIMUM DOSE OF 2 TABLETS TWICE DAILY IF NEEDED, Disp: 30 tablet, Rfl: 0   meloxicam (MOBIC) 15 MG tablet,  Take 1 tablet (15 mg total) by mouth daily., Disp: 30 tablet, Rfl: 0   montelukast (SINGULAIR) 10 MG tablet, TAKE 1 TABLET(10 MG) BY MOUTH EVERY EVENING, Disp: 30 tablet, Rfl: 5   pantoprazole (PROTONIX) 40 MG tablet, Take 1 tablet (40 mg total) by mouth every evening., Disp: 30 tablet, Rfl: 5   valACYclovir (VALTREX) 500 MG tablet, Take 500 mg by mouth See admin instructions. Take 500 mg twice daily for 3 days at onset of outbreak then take 500 mg once daily until clear, Disp: , Rfl:    Objective:     There were no vitals filed for this visit.    There is no height or weight on file to calculate BMI.    Physical Exam:    ***   Electronically signed by:  Paula [facility] 7:26 AM 02/10/23

## 2023-02-11 ENCOUNTER — Telehealth: Payer: Self-pay | Admitting: Sports Medicine

## 2023-02-11 ENCOUNTER — Ambulatory Visit: Payer: BC Managed Care – PPO | Admitting: Sports Medicine

## 2023-02-11 ENCOUNTER — Other Ambulatory Visit: Payer: Self-pay | Admitting: Sports Medicine

## 2023-02-11 MED ORDER — MELOXICAM 15 MG PO TABS: 15.0000 mg | ORAL_TABLET | Freq: Every day | ORAL | 0 refills | Status: AC

## 2023-02-11 NOTE — Progress Notes (Signed)
Meloxicam refill placed

## 2023-02-11 NOTE — Telephone Encounter (Signed)
Pt notified via V/M

## 2023-02-11 NOTE — Telephone Encounter (Signed)
Pt used the med we rx'd and did home exercises. Started to feel better for a bit but has returned to original pain.  She has scheduled PT, to start 9/18. She has scheduled f/u here in early October. Would like a refill of the meloxicam.

## 2023-02-17 ENCOUNTER — Other Ambulatory Visit: Payer: Self-pay

## 2023-02-17 ENCOUNTER — Ambulatory Visit: Payer: BC Managed Care – PPO | Admitting: Physical Therapy

## 2023-02-17 ENCOUNTER — Encounter: Payer: Self-pay | Admitting: Physical Therapy

## 2023-02-17 DIAGNOSIS — M5459 Other low back pain: Secondary | ICD-10-CM

## 2023-02-17 DIAGNOSIS — M6281 Muscle weakness (generalized): Secondary | ICD-10-CM | POA: Diagnosis not present

## 2023-02-17 DIAGNOSIS — M25551 Pain in right hip: Secondary | ICD-10-CM

## 2023-02-25 ENCOUNTER — Encounter: Payer: BC Managed Care – PPO | Admitting: Physical Therapy

## 2023-02-25 NOTE — Therapy (Deleted)
OUTPATIENT PHYSICAL THERAPY THORACOLUMBAR TREATMENT   Patient Name: Paula Berry MRN: [id number] DOB:November 28, 1970, 52 y.o., female Today's Date: 02/25/2023  END OF SESSION:    Past Medical History:  Diagnosis Date   Anemia    had iron infusion 08/12/15   Asthma    Breast cancer (HCC) 03/2018   Left Breast Cancer   Cancer (HCC)    Breast cancer    Genital herpes    GERD (gastroesophageal reflux disease)    Headache    migraines 2-3 times monthly   Personal history of chemotherapy    Personal history of radiation therapy    Pneumonia    hospitalizied for pneumonia as a child   Past Surgical History:  Procedure Laterality Date   ABDOMINAL HYSTERECTOMY Bilateral 08/22/2015   Procedure: HYSTERECTOMY ABDOMINAL, BILATERAL SALPINGECTOMY;  Surgeon: Harold Hedge, MD;  Location: [facility];  Service: Gynecology;  Laterality: Bilateral;   BREAST BIOPSY Right 07/22/2022   [place] RT BREAST BX W LOC DEV 1ST LESION IMG BX SPEC US GUIDE 07/22/2022 [facility]   MASTECTOMY Left 07/2018   MASTECTOMY WITH AXILLARY LYMPH NODE DISSECTION Left 08/30/2018   Procedure: Left mastectomy with axillary lymph node dissection;  Surgeon: Emelia Loron, MD;  Location: [facility];  Service: General;  Laterality: Left;   MYOMECTOMY ABDOMINAL APPROACH     PORTACATH PLACEMENT N/A 03/30/2018   Procedure: INSERTION PORT-A-CATH WITH [place];  Surgeon: Emelia Loron, MD;  Location: [facility];  Service: General;  Laterality: N/A;   Patient Active Problem List   Diagnosis Date Noted   Chronic rhinitis 10/13/2022   Seasonal allergic conjunctivitis 10/13/2022   Dysfunction of both eustachian tubes 10/13/2022   Obesity 08/21/2021   Gastroesophageal reflux disease 08/21/2021   Seasonal and perennial allergic rhinitis 02/24/2021   Chronic idiopathic urticaria 02/24/2021   Moderate persistent asthma without complication 02/24/2021   S/P mastectomy, left 08/30/2018   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  ONSET DATE: 1 year ago  SUBJECTIVE:  SUBJECTIVE STATEMENT: States pain cam on gradual over the last year. States that she felt it coming out of the car from leaving the gym. States the pain was real bad and she could barely get out of the car. States about 8 months ago the pain would radiate around her hip (front or back) and in her leg. States that she gets numbness in her medial lower leg. Pain is primarily in her hip. States she can stand to brush her teeth because the pain is so excruciating. States she has to off load her hip to make it feel better. Advil helps a little but not a lot.   States she stretches her hips out to the side and book stretching. States when she works out she cycles, aerobic steps and zumba and her pain limits her ability to participate in these workout classes.  States she sets a timer to move every 45 minutes if she is sitting at her desk for long periods of time and this helps a little.   PERTINENT HISTORY:  left breast cancer 2019, left masectomy 2020, hysterectomy 2017, asthma, right achille's  tendon right - controlled  PAIN:  Are you having pain? Yes: NPRS scale: 15/10 Pain location: right hip front and back Pain description:  sharp, shooting,throbbing, annoyance when not throbbing Aggravating factors: standing, equal weight on both legs Relieving factors: off loading hip, sitting , meloxicam, heat  PRECAUTIONS: None  RED FLAGS: None   WEIGHT BEARING RESTRICTIONS: No  FALLS:  Has patient fallen in last 6 months? 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   PLOF: Independent  PATIENT GOALS: too have less pain    OBJECTIVE:   DIAGNOSTIC FINDINGS:  LUMBAR XRAY 11/27/22 FINDINGS: Curvature of the lumbar spine, apex to the left. Trace anterolisthesis of L4 versus L5. No other malalignment. No fractures. No significant degenerative disc disease. No other significant abnormalities.   IMPRESSION: 1. Curvature of the lumbar spine, apex to the left. 2. Trace anterolisthesis of L4 versus L5. 3. No other significant abnormalities.  R HIP XRAY 11/27/22 COMPARISON:  None Available.   FINDINGS: Pelvis is intact with normal and symmetric sacroiliac joints.   No acute fracture or dislocation.   No aggressive osseous lesion.   Visualized sacral arcuate lines are unremarkable.   Unremarkable symphysis pubis.   There are mild degenerative changes of bilateral hip joints without significant joint space narrowing. Osteophytosis of the superior acetabulum.   No radiopaque foreign bodies.   IMPRESSION: 1. Mild degenerative change of right hip joint.    SCREENING FOR RED FLAGS: Bowel or bladder incontinence: No Spinal tumors: No Cauda equina syndrome: No Compression fracture: No Abdominal aneurysm: No  COGNITION: Overall cognitive status: Within functional limits for tasks assessed     SENSATION: Not tested    POSTURE: rounded shoulders, increased lumbar lordosis, and flexed trunk   PALPATION: Increased resting tone in right glutes, adductors and lumbar paraspinals and tenderness to these areas  LUMBAR ROM:   AROM eval  Flexion  WFL *  Extension WFL  Right lateral flexion 25% limited*  Left lateral flexion 25% limited *  Right rotation   Left rotation    (Blank rows = not tested) *pulling    LE Measurements Lower Extremity Right EVAL Left EVAL   A/PROM MMT A/PROM MMT  Hip Flexion [facility]   Hip Extension  3+  4  Hip Abduction  Hip Adduction      Hip Internal rotation 20  WFL   Hip External rotation 40  WFL   Knee Flexion      Knee Extension      Ankle Dorsiflexion      Ankle Plantarflexion      Ankle Inversion      Ankle Eversion       (Blank rows = not tested) * pain Right leg not stretching like right just muscle guarding and block feeling in hip    LUMBAR SPECIAL TESTS:  Straight leg raise test: Negative, Slump test: Negative, and leg length test right equal to longer   FUNCTIONAL TESTS:  Holds her breath with supine to sit, pain with bridges   TODAY'S TREATMENT:                                                                                                                              DATE:   02/25/2023  Therapeutic Exercise:  Aerobic: Supine: hip add iso and hip abd iso ball and belt 6 minutes total, SIJ isometrics push pull (right into extension Prone:  Seated:  Standing: Neuromuscular Re-education: Manual Therapy: Therapeutic Activity: Self Care: Trigger Point Dry Needling:  Modalities:    PATIENT EDUCATION:  Education details: on current presentation, on HEP, on clinical outcomes score and POC, on anatomy, pelvic floor dysfunction and relation to hip/back Person educated: Patient Education method: Programmer, multimedia, Demonstration, and Handouts Education comprehension: verbalized understanding   HOME EXERCISE PROGRAM: [id number]  ASSESSMENT:  CLINICAL IMPRESSION: Patient presents to therapy with complaints of right leg hip and leg pain that has been going on over the last year. Symptoms consistent with pelvic floor and hip muscle dysfunction and educated patient in  findings as well as rehab moving forward. Tolerated SIJ isometrics well and will progress as able. Patient would greatly benefit from skilled PT to improve overall function and QOL.    OBJECTIVE IMPAIRMENTS: decreased activity tolerance, difficulty walking, decreased ROM, decreased strength, increased muscle spasms, improper body mechanics, postural dysfunction, and pain.   ACTIVITY LIMITATIONS: bending, sitting, standing, sleeping, transfers, and locomotion level  PARTICIPATION LIMITATIONS: driving, community activity, and occupation  PERSONAL FACTORS: Fitness, Time since onset of injury/illness/exacerbation, and 1 comorbidity: hysterectomy  are also affecting patient's functional outcome.   REHAB POTENTIAL: Good  CLINICAL DECISION MAKING: Stable/uncomplicated  EVALUATION COMPLEXITY: Low   GOALS: Goals reviewed with patient? yes  SHORT TERM GOALS: Target date: 03/31/2023  Patient will be independent in self management strategies to improve quality of life and functional outcomes. Baseline: New Program Goal status: INITIAL  2.  Patient will report at least 50% improvement in overall symptoms and/or function to demonstrate improved functional mobility Baseline: 0% better Goal status: INITIAL  3.  Patient will be able to perform bed mobilities without pain (bridge painful) and without holding her breath.  Baseline: painful Goal status: INITIAL     LONG TERM GOALS: Target date: 05/12/2023  Patient will report at least 75% improvement in overall symptoms and/or function to demonstrate improved functional mobility Baseline: 0% better Goal status: INITIAL  2.  Patient will be able to get out of the car after the gym without severe hip/leg pain. Baseline: unable/painful Goal status: INITIAL  3.  Patient will be able to go up and down steps without pain to improve ability to perform step classes.  Baseline: painful Goal status: INITIAL   PLAN:  PT FREQUENCY:  1-2x/week  PT DURATION: 12 weeks  PLANNED INTERVENTIONS: Therapeutic exercises, Therapeutic activity, Neuromuscular re-education, Balance training, Gait training, Patient/Family education, Self Care, Joint mobilization, Joint manipulation, Stair training, Vestibular training, Canalith repositioning, Orthotic/Fit training, Prosthetic training, DME instructions, Aquatic Therapy, Dry Needling, Electrical stimulation, Spinal manipulation, Spinal mobilization, Cryotherapy, Moist heat, Taping, Traction, Ultrasound, Ionotophoresis 4mg /ml Dexamethasone, Manual therapy, and Re-evaluation.   PLAN FOR NEXT SESSION: SIJ isometrics, STM/percussion gun, long exhale/core activation, hip ROM and muscle activation, pelvic tilts    9:47 AM, 02/25/23 Tereasa Coop, DPT Physical Therapy with [facility]

## 2023-03-02 ENCOUNTER — Inpatient Hospital Stay: Payer: BC Managed Care – PPO | Attending: Hematology and Oncology | Admitting: Hematology and Oncology

## 2023-03-02 VITALS — BP 128/78 | HR 77 | Temp 97.3°F | Resp 18 | Ht 69.0 in | Wt 230.2 lb

## 2023-03-02 DIAGNOSIS — Z923 Personal history of irradiation: Secondary | ICD-10-CM | POA: Insufficient documentation

## 2023-03-02 DIAGNOSIS — Z853 Personal history of malignant neoplasm of breast: Secondary | ICD-10-CM | POA: Insufficient documentation

## 2023-03-02 DIAGNOSIS — C50412 Malignant neoplasm of upper-outer quadrant of left female breast: Secondary | ICD-10-CM | POA: Diagnosis not present

## 2023-03-02 DIAGNOSIS — Z9012 Acquired absence of left breast and nipple: Secondary | ICD-10-CM | POA: Diagnosis not present

## 2023-03-02 DIAGNOSIS — Z9221 Personal history of antineoplastic chemotherapy: Secondary | ICD-10-CM | POA: Insufficient documentation

## 2023-03-02 DIAGNOSIS — Z171 Estrogen receptor negative status [ER-]: Secondary | ICD-10-CM

## 2023-03-02 NOTE — Assessment & Plan Note (Signed)
03/16/2018 for a clinical T2 N3, stage IIIC invasive ductal carcinoma, triple negative, with an MIB-1 of 15% Neoadjuvant chemotherapy consisting of doxorubicin and cyclophosphamide in dose dense fashion x4 started 04/07/2018, completed 05/19/2018,  followed by weekly carboplatin and paclitaxel x10 started 06/02/2018, last dose 08/04/2018 (stopped two cycles early due to peripheral neuropathy) 08/30/2018: Left mastectomy: T1 a N0 grade 2 IDC with negative margins 12 axillary lymph nodes negative, triple negative Ki-67 30% 11/22/2018: Completed adjuvant radiation 10/31/2018: Insufficient tissue for Caris testing   Breast cancer surveillance: 1.  Breast exam 02/09/2022: Benign 2. mammogram: Right breast: 07/14/2022: 0 and 8 cm right breast mass.  Breast density category B 3.  227 224: Ultrasound-guided biopsy: Right breast: Fibrocystic change with UDH   Return to clinic in 1 year for follow-up

## 2023-03-03 ENCOUNTER — Telehealth: Payer: Self-pay

## 2023-03-03 NOTE — Addendum Note (Signed)
Addended by: Sherlyn Lick on: 03/03/2023 09:33 AM   Modules accepted: Orders

## 2023-03-03 NOTE — Telephone Encounter (Signed)
Per md orders entered for signatera. Requisition and all supported documents faxed to [phone]. Faxed confirmation was received.  

## 2023-03-03 NOTE — Progress Notes (Signed)
Patient Care Team: Ileana Ladd, MD (Inactive) as PCP - General (Family Medicine) Emelia Loron, MD as Consulting Physician (General Surgery) Lonie Peak, MD as Attending Physician (Radiation Oncology) Harold Hedge, MD as Consulting Physician (Obstetrics and Gynecology) Edwin Cap, MD as Attending Physician (Radiology) Serena Croissant, MD as Consulting Physician (Hematology and Oncology)  DIAGNOSIS:  Encounter Diagnosis  Name Primary?   Malignant neoplasm of upper-outer quadrant of left breast in female, estrogen receptor negative (HCC) Yes    SUMMARY OF ONCOLOGIC HISTORY: Oncology History  Malignant neoplasm of upper-outer quadrant of left breast in female, estrogen receptor negative (HCC)  03/21/2018 Initial Diagnosis   Malignant neoplasm of upper-outer quadrant of left breast in female, estrogen receptor negative (HCC)   04/07/2018 - 08/10/2018 Chemotherapy   DOXOrubicin (ADRIAMYCIN) chemo injection 130 mg, 60 mg/m2 = 130 mg, Intravenous,  Once, 4 of 4 cycles. Administration: 130 mg (04/07/2018), 130 mg (04/21/2018), 130 mg (05/05/2018), 130 mg (05/19/2018)  palonosetron (ALOXI) injection 0.25 mg, 0.25 mg, Intravenous,  Once, 14 of 16 cycles. Administration: 0.25 mg (04/07/2018), 0.25 mg (06/02/2018), 0.25 mg (04/21/2018), 0.25 mg (05/05/2018), 0.25 mg (05/19/2018), 0.25 mg (06/09/2018), 0.25 mg (06/16/2018), 0.25 mg (06/23/2018), 0.25 mg (06/30/2018), 0.25 mg (07/07/2018), 0.25 mg (07/14/2018), 0.25 mg (07/21/2018), 0.25 mg (07/28/2018), 0.25 mg (08/04/2018)  pegfilgrastim-cbqv (UDENYCA) injection 6 mg, 6 mg, Subcutaneous, Once, 4 of 4 cycles. Administration: 6 mg (04/09/2018), 6 mg (04/23/2018), 6 mg (05/07/2018), 6 mg (05/21/2018)  CARBOplatin (PARAPLATIN) 300 mg in sodium chloride 0.9 % 250 mL chemo infusion, 300 mg (100 % of original dose 300 mg), Intravenous,  Once, 10 of 12 cycles. Dose modification:   (original dose 300 mg, Cycle 5). Administration: 300 mg (06/02/2018), 300 mg  (06/09/2018), 300 mg (06/16/2018), 300 mg (06/23/2018), 300 mg (06/30/2018), 300 mg (07/07/2018), 300 mg (07/14/2018), 300 mg (07/21/2018), 300 mg (07/28/2018), 300 mg (08/04/2018)  cyclophosphamide (CYTOXAN) 1,300 mg in sodium chloride 0.9 % 250 mL chemo infusion, 600 mg/m2 = 1,300 mg, Intravenous,  Once, 4 of 4 cycles. Administration: 1,300 mg (04/07/2018), 1,300 mg (04/21/2018), 1,300 mg (05/05/2018), 1,300 mg (05/19/2018)  PACLitaxel (TAXOL) 174 mg in sodium chloride 0.9 % 250 mL chemo infusion (</= 80mg /m2), 80 mg/m2 = 174 mg, Intravenous,  Once, 10 of 12 cycles. Administration: 174 mg (06/02/2018), 174 mg (06/09/2018), 174 mg (06/16/2018), 174 mg (06/23/2018), 174 mg (06/30/2018), 174 mg (07/07/2018), 174 mg (07/14/2018), 174 mg (07/21/2018), 174 mg (07/28/2018), 174 mg (08/04/2018)  fosaprepitant (EMEND) 150 mg  dexamethasone (DECADRON) 12 mg in sodium chloride 0.9 % 145 mL IVPB, , Intravenous,  Once, 14 of 16 cycles. Administration:  (04/07/2018),  (06/02/2018),  (04/21/2018),  (05/05/2018),  (05/19/2018),  (06/09/2018),  (06/16/2018),  (06/23/2018),  (06/30/2018),  (07/07/2018),  (07/14/2018),  (07/21/2018),  (07/28/2018),  (08/04/2018)    04/27/2018 Genetic Testing   Negative genetic testing on the multi-cancer panel.  The Multi-Gene Panel offered by [facility] includes sequencing and/or deletion duplication testing of the following 84 genes: AIP, ALK, APC, ATM, AXIN2,BAP1,  BARD1, BLM, BMPR1A, BRCA1, BRCA2, BRIP1, CASR, CDC73, CDH1, CDK4, CDKN1B, CDKN1C, CDKN2A (p14ARF), CDKN2A (p16INK4a), CEBPA, CHEK2, CTNNA1, DICER1, DIS3L2, EGFR (c.2369C>T, p.Thr790Met variant only), EPCAM (Deletion/duplication testing only), FH, FLCN, GATA2, GPC3, GREM1 (Promoter region deletion/duplication testing only), HOXB13 (c.251G>A, p.Gly84Glu), HRAS, KIT, MAX, MEN1, MET, MITF (c.952G>A, p.Glu318Lys variant only), MLH1, MSH2, MSH3, MSH6, MUTYH, NBN, NF1, NF2, NTHL1, PALB2, PDGFRA, PHOX2B, PMS2, POLD1, POLE, POT1, PRKAR1A, PTCH1, PTEN, RAD50, RAD51C, RAD51D, RB1,  RECQL4, RET, RUNX1, SDHAF2, SDHA (sequence changes only), SDHB, SDHC,  SDHD, SMAD4, SMARCA4, SMARCB1, SMARCE1, STK11, SUFU, TERC, TERT, TMEM127, TP53, TSC1, TSC2, VHL, WRN and WT1.  The report date is April 27, 2018.   08/24/2018 Cancer Staging   Staging form: Breast, AJCC 8th Edition - Pathologic: No Stage Recommended (ypT1a, pN0, cM0, G2, ER-, PR-, HER2-) - Signed by Lonie Peak, MD on 09/28/2018   08/30/2018 Surgery   Left mastectomy [name]) [phone]) revealed residual IDC, grade 2, negative margins. 12 axillary lymph nodes were negative for carcinoma. Triple negative, MIB-1 of 30%.   10/26/2018 - 11/22/2018 Radiation Therapy   The patient initially received a dose of 50.4 Gy in 28 fractions to the breast using whole-breast tangent fields. This was delivered using a 3-D conformal technique. The pt received a boost delivering an additional 10 Gy in 5 fractions using a electron boost with electrons. The total dose was 60.4 Gy.     CHIEF COMPLIANT:    History of Present Illness   The patient, a breast cancer survivor, presents for a routine follow-up. She is celebrating her five-year milestone of being cancer-free. She reports a recent scare with a mammogram, ultrasound, and biopsy, but everything turned out fine. She expresses anxiety around the time of her mammograms due to a previous experience where she felt discomfort shortly after a routine mammogram, which led to her cancer diagnosis.  The patient also discusses her prosthesis, which she received shortly after her mastectomy. She expresses interest in getting fitted for new ones, as the current ones are from 2016 and one of them is quite heavy. She decided against reconstructive surgery due to concerns about scarring from radiation.         ALLERGIES:  is allergic to bacitra-neomycin-polymyxin-hc, neosporin [neomycin-bacitracin zn-polymyx], cat hair extract, molds & smuts, and other.  MEDICATIONS:  Current Outpatient  Medications  Medication Sig Dispense Refill   acetaminophen (TYLENOL) 500 MG tablet Take 1,000 mg by mouth every 8 (eight) hours as needed for moderate pain.     albuterol (VENTOLIN HFA) 108 (90 Base) MCG/ACT inhaler Inhale 2 puffs into the lungs every 6 (six) hours as needed for wheezing or shortness of breath. 1 each 2   budesonide-formoterol (SYMBICORT) 160-4.5 MCG/ACT inhaler Inhale 2 puffs into the lungs 2 (two) times daily. 1 each 5   cimetidine (TAGAMET) 400 MG tablet Take 400 mg by mouth 3 (three) times daily.     EPINEPHrine 0.3 mg/0.3 mL IJ SOAJ injection Inject 0.3 mg into the muscle once.     hydrOXYzine (VISTARIL) 25 MG capsule Take 25-50 mg by mouth at bedtime.     loratadine (CLARITIN) 10 MG tablet TAKE 1 TABLET BY MOUTH TWICE DAILY FOR HIVES, CAN INCREASE TO MAXIMUM DOSE OF 2 TABLETS TWICE DAILY IF NEEDED 30 tablet 0   meloxicam (MOBIC) 15 MG tablet Take 1 tablet (15 mg total) by mouth daily. 30 tablet 0   montelukast (SINGULAIR) 10 MG tablet TAKE 1 TABLET(10 MG) BY MOUTH EVERY EVENING 30 tablet 5   pantoprazole (PROTONIX) 40 MG tablet Take 1 tablet (40 mg total) by mouth every evening. 30 tablet 5   valACYclovir (VALTREX) 500 MG tablet Take 500 mg by mouth See admin instructions. Take 500 mg twice daily for 3 days at onset of outbreak then take 500 mg once daily until clear     No current facility-administered medications for this visit.    PHYSICAL EXAMINATION: ECOG PERFORMANCE STATUS: 1 - Symptomatic but completely ambulatory  Vitals:   03/02/23 1522  BP: 128/78  Pulse: 77  Resp: 18  Temp: (!) 97.3 F (36.3 C)  SpO2: 98%   Filed Weights   03/02/23 1522  Weight: 230 lb 3.2 oz (104.4 kg)      LABORATORY DATA:  I have reviewed the data as listed    Latest Ref Rng & Units 02/09/2022    2:20 PM 08/21/2021    1:43 PM 05/07/2021   10:23 AM  CMP  Glucose 70 - 99 mg/dL 409  811  88   BUN 6 - 20 mg/dL 11  17  14    Creatinine 0.44 - 1.00 mg/dL 9.14  7.82  9.56    Sodium 135 - 145 mmol/L 139  140  145   Potassium 3.5 - 5.1 mmol/L 3.7  3.7  4.7   Chloride 98 - 111 mmol/L 106  108  110   CO2 22 - 32 mmol/L 30  25  22    Calcium 8.9 - 10.3 mg/dL 9.2  9.6  9.5   Total Protein 6.5 - 8.1 g/dL 6.9  6.8  7.3   Total Bilirubin 0.3 - 1.2 mg/dL 1.1  1.0  1.2   Alkaline Phos 38 - 126 U/L 67  73  68   AST 15 - 41 U/L 17  13  14    ALT 0 - 44 U/L 12  11  11      Lab Results  Component Value Date   WBC 6.4 02/09/2022   HGB 14.3 02/09/2022   HCT 43.0 02/09/2022   MCV 96.4 02/09/2022   PLT 248 02/09/2022   NEUTROABS 2.9 02/09/2022    ASSESSMENT & PLAN:  Malignant neoplasm of upper-outer quadrant of left breast in female, estrogen receptor negative (HCC) 03/16/2018 for a clinical T2 N3, stage IIIC invasive ductal carcinoma, triple negative, with an MIB-1 of 15% Neoadjuvant chemotherapy consisting of doxorubicin and cyclophosphamide in dose dense fashion x4 started 04/07/2018, completed 05/19/2018,  followed by weekly carboplatin and paclitaxel x10 started 06/02/2018, last dose 08/04/2018 (stopped two cycles early due to peripheral neuropathy) 08/30/2018: Left mastectomy: T1 a N0 grade 2 IDC with negative margins 12 axillary lymph nodes negative, triple negative Ki-67 30% 11/22/2018: Completed adjuvant radiation 10/31/2018: Insufficient tissue for Caris testing   Breast cancer surveillance: 1.  Breast exam 02/09/2022: Benign 2. mammogram: Right breast: 07/14/2022: 0 and 8 cm right breast mass.  Breast density category B 3.  07/28/2022: Ultrasound-guided biopsy: Right breast: Fibrocystic change with UDH       Breast Cancer Surveillance Five-year post-treatment milestone achieved. Recent mammogram, ultrasound, and biopsy were all negative. Discussed the availability of a new blood test (Signatera) that can detect cancer DNA in the bloodstream, potentially identifying recurrence earlier than imaging studies. -Order Signatera test. If insurance does not cover, the  company will provide the test for free. -Continue regular mammograms as per current guidelines.  Prosthesis Update Patient expressed interest in updating her current prosthesis. No interest in reconstructive surgery due to previous radiation treatment and potential complications. -Provide prescription for new prosthesis fitting.          No orders of the defined types were placed in this encounter.  The patient has a good understanding of the overall plan. she agrees with it. she will call with any problems that may develop before the next visit here. Total time spent: 30 mins including face to face time and time spent for planning, charting and co-ordination of care   Tamsen Meek, MD 03/03/23

## 2023-03-04 ENCOUNTER — Encounter: Payer: BC Managed Care – PPO | Admitting: Physical Therapy

## 2023-03-10 ENCOUNTER — Other Ambulatory Visit: Payer: Self-pay | Admitting: Family Medicine

## 2023-03-10 NOTE — Telephone Encounter (Signed)
Last OV 12/17/22 Next OV 03/11/23  Last refill 02/11/23 Qty #30/0   Renal labs last checked 02/09/22  Forwarding to Dr. Jean Rosenthal  BUN 6 - 20 mg/dL 11 17 14 10 10 11 11   Creatinine 0.44 - 1.00 mg/dL 1.61 0.96 0.45 4.09 8.11 0.78 0.74  Calcium 8.9 - 10.3 mg/dL 9.2 9.6 9.5 9.1 9.3 9.2 9.7  Total Protein 6.5 - 8.1 g/dL 6.9 6.8 7.3 6.7 7.1 7.3 7.3  Albumin 3.5 - 5.0 g/dL 4.2 4.2 4.1 3.7 3.9 3.9 4.0  AST 15 - 41 U/L 17 13 Low  14 Low  13 Low  12 Low  13 Low  11 Low   ALT 0 - 44 U/L 12 11 11 8 9 9 9   Alkaline Phosphatase 38 - 126 U/L 67 73 68 72 85 93 67  Total Bilirubin 0.3 - 1.2 mg/dL 1.1 1.0 1.2 1.1 1.3 High  0.9 0.8  GFR, Estimated >60 mL/min >60 >60 CM >60 CM   >60 >60  Comment: (NOTE) Calculated using the CKD-EPI Creatinine Equation (2021)  Anion gap 5 - 15 3 Low  7 CM 13 CM 7 CM 7 CM 11 CM 8 CM  Comment: Performed at [facility], [address] [place], [place] [address]

## 2023-03-10 NOTE — Progress Notes (Deleted)
    [facility] [address] [place] [address] Phone: [phone]   Assessment and Plan:     There are no diagnoses linked to this encounter.  ***   Pertinent previous records reviewed include ***   Follow Up: ***     Subjective:   I, Paula Berry, am serving as a Neurosurgeon for Doctor Paula Berry   Chief Complaint: low back pain    HPI:    12/17/2022 Patient is a 52 year old female complaining of low back pain. Patient states she has low back pain that radiates to the front of her hip. Pain for about 8 months . Pain when sitting , working out, driving , and pain when standing or walking. March her leg started going numb. Tylenol arthritis and aleve and that didn't help a lot . Pain doesn't stay along time it comes and goes in 5-10 minute incriminates.    03/11/2023 Patient states   Relevant Historical Information: GERD  Additional pertinent review of systems negative.   Current Outpatient Medications:    acetaminophen (TYLENOL) 500 MG tablet, Take 1,000 mg by mouth every 8 (eight) hours as needed for moderate pain., Disp: , Rfl:    albuterol (VENTOLIN HFA) 108 (90 Base) MCG/ACT inhaler, Inhale 2 puffs into the lungs every 6 (six) hours as needed for wheezing or shortness of breath., Disp: 1 each, Rfl: 2   budesonide-formoterol (SYMBICORT) 160-4.5 MCG/ACT inhaler, Inhale 2 puffs into the lungs 2 (two) times daily., Disp: 1 each, Rfl: 5   cimetidine (TAGAMET) 400 MG tablet, Take 400 mg by mouth 3 (three) times daily., Disp: , Rfl:    EPINEPHrine 0.3 mg/0.3 mL IJ SOAJ injection, Inject 0.3 mg into the muscle once., Disp: , Rfl:    hydrOXYzine (VISTARIL) 25 MG capsule, Take 25-50 mg by mouth at bedtime., Disp: , Rfl:    loratadine (CLARITIN) 10 MG tablet, TAKE 1 TABLET BY MOUTH TWICE DAILY FOR HIVES, CAN INCREASE TO MAXIMUM DOSE OF 2 TABLETS TWICE DAILY IF NEEDED, Disp: 30 tablet, Rfl: 0   meloxicam (MOBIC) 15 MG  tablet, Take 1 tablet (15 mg total) by mouth daily., Disp: 30 tablet, Rfl: 0   montelukast (SINGULAIR) 10 MG tablet, TAKE 1 TABLET(10 MG) BY MOUTH EVERY EVENING, Disp: 30 tablet, Rfl: 5   pantoprazole (PROTONIX) 40 MG tablet, Take 1 tablet (40 mg total) by mouth every evening., Disp: 30 tablet, Rfl: 5   valACYclovir (VALTREX) 500 MG tablet, Take 500 mg by mouth See admin instructions. Take 500 mg twice daily for 3 days at onset of outbreak then take 500 mg once daily until clear, Disp: , Rfl:    Objective:     There were no vitals filed for this visit.    There is no height or weight on file to calculate BMI.    Physical Exam:    ***   Electronically signed by:  Paula [facility] 3:42 PM 03/10/23

## 2023-03-11 ENCOUNTER — Ambulatory Visit: Payer: BC Managed Care – PPO | Admitting: Sports Medicine

## 2023-03-18 ENCOUNTER — Encounter: Payer: BC Managed Care – PPO | Admitting: Physical Therapy

## 2023-03-25 ENCOUNTER — Telehealth: Payer: Self-pay | Admitting: *Deleted

## 2023-03-25 NOTE — Telephone Encounter (Signed)
Received message from [facility] team stating there was not enough tissue sample to proceed with testing.  Testing canceled.  RN attempt x1 to return call.  No answer, LVM for pt to return call tot he office.

## 2023-08-24 ENCOUNTER — Other Ambulatory Visit: Payer: Self-pay | Admitting: *Deleted

## 2023-08-24 ENCOUNTER — Encounter: Payer: Self-pay | Admitting: Adult Health

## 2023-08-24 ENCOUNTER — Inpatient Hospital Stay

## 2023-08-24 ENCOUNTER — Inpatient Hospital Stay: Payer: Self-pay | Attending: Adult Health | Admitting: Adult Health

## 2023-08-24 VITALS — BP 126/80 | HR 76 | Temp 97.7°F | Resp 18 | Ht 69.0 in | Wt 227.2 lb

## 2023-08-24 DIAGNOSIS — Z853 Personal history of malignant neoplasm of breast: Secondary | ICD-10-CM | POA: Diagnosis present

## 2023-08-24 DIAGNOSIS — C50412 Malignant neoplasm of upper-outer quadrant of left female breast: Secondary | ICD-10-CM

## 2023-08-24 DIAGNOSIS — T7849XA Other allergy, initial encounter: Secondary | ICD-10-CM | POA: Insufficient documentation

## 2023-08-24 DIAGNOSIS — Z923 Personal history of irradiation: Secondary | ICD-10-CM | POA: Insufficient documentation

## 2023-08-24 DIAGNOSIS — Z171 Estrogen receptor negative status [ER-]: Secondary | ICD-10-CM | POA: Diagnosis not present

## 2023-08-24 DIAGNOSIS — Z9012 Acquired absence of left breast and nipple: Secondary | ICD-10-CM | POA: Diagnosis not present

## 2023-08-24 DIAGNOSIS — J45909 Unspecified asthma, uncomplicated: Secondary | ICD-10-CM | POA: Diagnosis not present

## 2023-08-24 NOTE — Assessment & Plan Note (Addendum)
 03/16/2018 for a clinical T2 N3, stage IIIC invasive ductal carcinoma, triple negative, with an MIB-1 of 15% Neoadjuvant chemotherapy consisting of doxorubicin and cyclophosphamide in dose dense fashion x4 started 04/07/2018, completed 05/19/2018,  followed by weekly carboplatin and paclitaxel x10 started 06/02/2018, last dose 08/04/2018 (stopped two cycles early due to peripheral neuropathy) 08/30/2018: Left mastectomy: T1 a N0 grade 2 IDC with negative margins 12 axillary lymph nodes negative, triple negative Ki-67 30% 11/22/2018: Completed adjuvant radiation 10/31/2018: Insufficient tissue for Caris testing Guardant reveal testing 07/2023  Breast cancer follow-up Status post left mastectomy. Right breast mass biopsy showed fibrocystic change with usual ductal hyperplasia, negative for carcinoma. No signs of recurrence. Recommended GARDEN testing for circulating tumor DNA monitoring due to advanced triple-negative breast cancer. - Schedule mammogram. - Perform Gardent Reveal testing to assess circulating tumor DNA. -Will talk with PT about receptionists to understand if there continues to be a conflict.   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 Requires physician's letter for reasonable accommodation. - Write a letter for reasonable accommodation regarding hypersensitivity to fragrances.  Asthma Increased inhaler use, 3-5 times daily, due to fragrance exposure indicating asthma exacerbation. - Include asthma considerations in the letter for reasonable accommodation.  Deodorant sensitivity Sensitivity to natural deodorants causing burning and irritation. Advised to use Yuka app for ingredient assessment. - Recommend trying Salt and Stone deodorant. - Advise using the Yuka app to evaluate deodorant ingredients.  Follow-up Scheduled for regular six-month follow-ups with hematology and oncology team for ongoing monitoring. - Schedule  follow-up appointment in six months.

## 2023-08-24 NOTE — Progress Notes (Signed)
 [facility] Cancer Follow up:    Paula Berry (Inactive) No address on file   DIAGNOSIS:  Cancer Staging  Malignant neoplasm of upper-outer quadrant of left breast in female, estrogen receptor negative (HCC) Staging form: Breast, AJCC 8th Edition - Clinical stage from 03/23/2018: Stage IIIB (cT2, cN1, cM0, G3, ER-, PR-, HER2-) - Unsigned Histologic grading system: 3 grade system Laterality: Left Staged by: Pathologist and managing physician Stage used in treatment planning: Yes National guidelines used in treatment planning: Yes Type of national guideline used in treatment planning: NCCN - Pathologic: No Stage Recommended (ypT1a, pN0, cM0, G2, ER-, PR-, HER2-) - Signed by Lonie Peak, Berry on 09/28/2018 Stage prefix: Post-therapy Neoadjuvant therapy: Yes Histologic grading system: 3 grade system   SUMMARY OF ONCOLOGIC HISTORY: Oncology History  Malignant neoplasm of upper-outer quadrant of left breast in female, estrogen receptor negative (HCC)  03/21/2018 Initial Diagnosis   Malignant neoplasm of upper-outer quadrant of left breast in female, estrogen receptor negative (HCC)   04/07/2018 - 08/10/2018 Chemotherapy   DOXOrubicin (ADRIAMYCIN) chemo injection 130 mg, 60 mg/m2 = 130 mg, Intravenous,  Once, 4 of 4 cycles. Administration: 130 mg (04/07/2018), 130 mg (04/21/2018), 130 mg (05/05/2018), 130 mg (05/19/2018)  palonosetron (ALOXI) injection 0.25 mg, 0.25 mg, Intravenous,  Once, 14 of 16 cycles. Administration: 0.25 mg (04/07/2018), 0.25 mg (06/02/2018), 0.25 mg (04/21/2018), 0.25 mg (05/05/2018), 0.25 mg (05/19/2018), 0.25 mg (06/09/2018), 0.25 mg (06/16/2018), 0.25 mg (06/23/2018), 0.25 mg (06/30/2018), 0.25 mg (07/07/2018), 0.25 mg (07/14/2018), 0.25 mg (07/21/2018), 0.25 mg (07/28/2018), 0.25 mg (08/04/2018)  pegfilgrastim-cbqv (UDENYCA) injection 6 mg, 6 mg, Subcutaneous, Once, 4 of 4 cycles. Administration: 6 mg (04/09/2018), 6 mg (04/23/2018), 6 mg (05/07/2018), 6 mg  (05/21/2018)  CARBOplatin (PARAPLATIN) 300 mg in sodium chloride 0.9 % 250 mL chemo infusion, 300 mg (100 % of original dose 300 mg), Intravenous,  Once, 10 of 12 cycles. Dose modification:   (original dose 300 mg, Cycle 5). Administration: 300 mg (06/02/2018), 300 mg (06/09/2018), 300 mg (06/16/2018), 300 mg (06/23/2018), 300 mg (06/30/2018), 300 mg (07/07/2018), 300 mg (07/14/2018), 300 mg (07/21/2018), 300 mg (07/28/2018), 300 mg (08/04/2018)  cyclophosphamide (CYTOXAN) 1,300 mg in sodium chloride 0.9 % 250 mL chemo infusion, 600 mg/m2 = 1,300 mg, Intravenous,  Once, 4 of 4 cycles. Administration: 1,300 mg (04/07/2018), 1,300 mg (04/21/2018), 1,300 mg (05/05/2018), 1,300 mg (05/19/2018)  PACLitaxel (TAXOL) 174 mg in sodium chloride 0.9 % 250 mL chemo infusion (</= 80mg /m2), 80 mg/m2 = 174 mg, Intravenous,  Once, 10 of 12 cycles. Administration: 174 mg (06/02/2018), 174 mg (06/09/2018), 174 mg (06/16/2018), 174 mg (06/23/2018), 174 mg (06/30/2018), 174 mg (07/07/2018), 174 mg (07/14/2018), 174 mg (07/21/2018), 174 mg (07/28/2018), 174 mg (08/04/2018)  fosaprepitant (EMEND) 150 mg  dexamethasone (DECADRON) 12 mg in sodium chloride 0.9 % 145 mL IVPB, , Intravenous,  Once, 14 of 16 cycles. Administration:  (04/07/2018),  (06/02/2018),  (04/21/2018),  (05/05/2018),  (05/19/2018),  (06/09/2018),  (06/16/2018),  (06/23/2018),  (06/30/2018),  (07/07/2018),  (07/14/2018),  (07/21/2018),  (07/28/2018),  (08/04/2018)    04/27/2018 Genetic Testing   Negative genetic testing on the multi-cancer panel.  The Multi-Gene Panel offered by [facility] includes sequencing and/or deletion duplication testing of the following 84 genes: AIP, ALK, APC, ATM, AXIN2,BAP1,  BARD1, BLM, BMPR1A, BRCA1, BRCA2, BRIP1, CASR, CDC73, CDH1, CDK4, CDKN1B, CDKN1C, CDKN2A (p14ARF), CDKN2A (p16INK4a), CEBPA, CHEK2, CTNNA1, DICER1, DIS3L2, EGFR (c.2369C>T, p.Thr790Met variant only), EPCAM (Deletion/duplication testing only), FH, FLCN, GATA2, GPC3, GREM1 (Promoter region  deletion/duplication  testing only), HOXB13 (c.251G>A, p.Gly84Glu), HRAS, KIT, MAX, MEN1, MET, MITF (c.952G>A, p.Glu318Lys variant only), MLH1, MSH2, MSH3, MSH6, MUTYH, NBN, NF1, NF2, NTHL1, PALB2, PDGFRA, PHOX2B, PMS2, POLD1, POLE, POT1, PRKAR1A, PTCH1, PTEN, RAD50, RAD51C, RAD51D, RB1, RECQL4, RET, RUNX1, SDHAF2, SDHA (sequence changes only), SDHB, SDHC, SDHD, SMAD4, SMARCA4, SMARCB1, SMARCE1, STK11, SUFU, TERC, TERT, TMEM127, TP53, TSC1, TSC2, VHL, WRN and WT1.  The report date is April 27, 2018.   08/24/2018 Cancer Staging   Staging form: Breast, AJCC 8th Edition - Pathologic: No Stage Recommended (ypT1a, pN0, cM0, G2, ER-, PR-, HER2-) - Signed by Lonie Peak, Berry on 09/28/2018   08/30/2018 Surgery   Left mastectomy Paula Berry) [phone]) revealed residual IDC, grade 2, negative margins. 12 axillary lymph nodes were negative for carcinoma. Triple negative, MIB-1 of 30%.   10/26/2018 - 11/22/2018 Radiation Therapy   The patient initially received a dose of 50.4 Gy in 28 fractions to the breast using whole-breast tangent fields. This was delivered using a 3-D conformal technique. The pt received a boost delivering an additional 10 Gy in 5 fractions using a electron boost with electrons. The total dose was 60.4 Gy.     CURRENT THERAPY: Observation  INTERVAL HISTORY:  Discussed the use of AI scribe software for clinical note transcription with the patient, who gave verbal consent to proceed.   Paula Berry has a history of advanced triple negative breast cancer, presents for routine follow-up. She underwent a left mastectomy and still has her right breast. Her most recent mammogram was in February, 2024, which showed an upper right breast mass, likely a complicated cyst. A biopsy was performed, demonstrating fibrocystic change with usual ductal hyperplasia, negative for carcinoma.  The patient reports two main concerns. Firstly, she has been experiencing lymphedema, which she previously  managed with regular stretches and massages at a clinic. However, due to personal reasons, she no longer feels comfortable attending this clinic and is seeking a new provider for lymphedema management.   Secondly, she has developed a hypersensitivity to scented products, such as air fresheners, used by her coworkers. This has been causing her to experience symptoms such as dizziness, lightheadedness, and nausea. She is seeking a letter from her physician to request a reasonable accommodation at work due to these symptoms.  The patient also mentions a concern about her deodorant, as she has developed a sensitivity in her left armpit. She has tried various natural deodorants, but these either cause burning and irritation or do not provide sufficient odor control. She is seeking advice on suitable deodorant options.   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  MEDICAL HISTORY: Past Medical History:  Diagnosis Date   Anemia    had iron infusion 08/12/15   Asthma    Breast cancer (HCC) 03/2018   Left Breast Cancer   Cancer (HCC)  Breast cancer    Genital herpes    GERD (gastroesophageal reflux disease)    Headache     migraines 2-3 times monthly   Personal history of chemotherapy    Personal history of radiation therapy    Pneumonia    hospitalizied for pneumonia as a child    SURGICAL HISTORY: Past Surgical History:  Procedure Laterality Date   ABDOMINAL HYSTERECTOMY Bilateral 08/22/2015   Procedure: HYSTERECTOMY ABDOMINAL, BILATERAL SALPINGECTOMY;  Surgeon: Harold Hedge, Berry;  Location: [facility];  Service: Gynecology;  Laterality: Bilateral;   BREAST BIOPSY Right 07/22/2022   [place] RT BREAST BX W LOC DEV 1ST LESION IMG BX SPEC US GUIDE 07/22/2022 [facility]   MASTECTOMY Left 07/2018   MASTECTOMY WITH AXILLARY LYMPH NODE DISSECTION Left 08/30/2018   Procedure: Left mastectomy with axillary lymph node dissection;  Surgeon: Emelia Loron, Berry;  Location: [facility];  Service: General;  Laterality: Left;   MYOMECTOMY ABDOMINAL APPROACH     PORTACATH PLACEMENT N/A 03/30/2018   Procedure: INSERTION PORT-A-CATH WITH [place];  Surgeon: Emelia Loron, Berry;  Location: [facility];  Service: General;  Laterality: N/A;    SOCIAL HISTORY: Social History   Socioeconomic History   Marital status: Married    Spouse name: Ronnie   Number of children: 0   Years of education: 20   Highest education level: Not on file  Occupational History   Occupation: [employer]  Tobacco Use   Smoking status: Never   Smokeless tobacco: Never  Vaping Use   Vaping status: Never Used  Substance and Sexual Activity   Alcohol use: Yes    Alcohol/week: 1.0 standard drink of alcohol    Types: 1 Glasses of wine per week    Comment: daily   Drug use: No   Sexual activity: Not on file  Other Topics Concern   Not on file  Social History Narrative   Lives with spouse   Caffeine use: 1 cup coffee/day (<5 days per week)   No soda often, unless she has a headache (2x/month)   Tea- rarely    Social Drivers of Corporate investment banker Strain: Not on file  Food Insecurity: Not on file   Transportation Needs: No Transportation Needs (09/27/2018)   PRAPARE - Administrator, Civil Service (Medical): No    Lack of Transportation (Non-Medical): No  Physical Activity: Not on file  Stress: Not on file  Social Connections: Not on file  Intimate Partner Violence: Unknown (09/27/2018)   Humiliation, Afraid, Rape, and Kick questionnaire    Fear of Current or Ex-Partner: No    Emotionally Abused: No    Physically Abused: Not on file    Sexually Abused: No    FAMILY HISTORY: Family History  Problem Relation Age of Onset   Hypertension Other    Diabetes Cousin        mat first cousin   Cancer Other    Dementia Maternal Grandfather    Lung cancer Paternal Grandmother        d. 97-60   Brain cancer Paternal Aunt        d. 43, father's mat 1/2 sister   Diabetes Maternal Grandmother    Other Paternal Grandfather        d. WWII   Aneurysm Maternal Uncle        brain   Migraines Neg Hx     Review of Systems  Constitutional:  Negative for appetite change, chills, fatigue, fever  and unexpected weight change.  HENT:   Negative for hearing loss, lump/mass and trouble swallowing.   Eyes:  Negative for eye problems and icterus.  Respiratory:  Negative for chest tightness, cough and shortness of breath.   Cardiovascular:  Negative for chest pain, leg swelling and palpitations.  Gastrointestinal:  Negative for abdominal distention, abdominal pain, constipation, diarrhea, nausea and vomiting.  Endocrine: Negative for hot flashes.  Genitourinary:  Negative for difficulty urinating.   Musculoskeletal:  Negative for arthralgias.  Skin:  Negative for itching and rash.  Neurological:  Negative for dizziness, extremity weakness, headaches and numbness.  Hematological:  Negative for adenopathy. Does not bruise/bleed easily.  Psychiatric/Behavioral:  Negative for depression. The patient is not nervous/anxious.       PHYSICAL EXAMINATION    Vitals:   08/24/23 1020   BP: 126/80  Pulse: 76  Resp: 18  Temp: 97.7 F (36.5 C)  SpO2: 99%    Physical Exam Constitutional:      General: She is not in acute distress.    Appearance: Normal appearance. She is not toxic-appearing.  HENT:     Head: Normocephalic and atraumatic.     Mouth/Throat:     Mouth: Mucous membranes are moist.     Pharynx: Oropharynx is clear. No oropharyngeal exudate or posterior oropharyngeal erythema.  Eyes:     General: No scleral icterus. Cardiovascular:     Rate and Rhythm: Normal rate and regular rhythm.     Pulses: Normal pulses.     Heart sounds: Normal heart sounds.  Pulmonary:     Effort: Pulmonary effort is normal.     Breath sounds: Normal breath sounds.  Chest:     Comments: Breast status postmastectomy no sign of local recurrence right breast benign. Abdominal:     General: Abdomen is flat. Bowel sounds are normal. There is no distension.     Palpations: Abdomen is soft.     Tenderness: There is no abdominal tenderness.  Musculoskeletal:        General: No swelling.     Cervical back: Neck supple.  Lymphadenopathy:     Cervical: No cervical adenopathy.     Upper Body:     Right upper body: No supraclavicular or axillary adenopathy.     Left upper body: No supraclavicular or axillary adenopathy.  Skin:    General: Skin is warm and dry.     Findings: No rash.  Neurological:     General: No focal deficit present.     Mental Status: She is alert.  Psychiatric:        Mood and Affect: Mood normal.        Behavior: Behavior normal.      ASSESSMENT and THERAPY PLAN:   Malignant neoplasm of upper-outer quadrant of left breast in female, estrogen receptor negative (HCC) 03/16/2018 for a clinical T2 N3, stage IIIC invasive ductal carcinoma, triple negative, with an MIB-1 of 15% Neoadjuvant chemotherapy consisting of doxorubicin and cyclophosphamide in dose dense fashion x4 started 04/07/2018, completed 05/19/2018,  followed by weekly carboplatin and  paclitaxel x10 started 06/02/2018, last dose 08/04/2018 (stopped two cycles early due to peripheral neuropathy) 08/30/2018: Left mastectomy: T1 a N0 grade 2 IDC with negative margins 12 axillary lymph nodes negative, triple negative Ki-67 30% 11/22/2018: Completed adjuvant radiation 10/31/2018: Insufficient tissue for Caris testing Guardant reveal testing 07/2023  Breast cancer follow-up Status post left mastectomy. Right breast mass biopsy showed fibrocystic change with usual ductal hyperplasia, negative for carcinoma. No signs  of recurrence. Recommended GARDEN testing for circulating tumor DNA monitoring due to advanced triple-negative breast cancer. - Schedule mammogram. - Perform Gardent Reveal testing to assess circulating tumor DNA. -Will talk with PT about receptionists to understand if there continues to be a conflict.   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 Requires physician's letter for reasonable accommodation. - Write a letter for reasonable accommodation regarding hypersensitivity to fragrances.  Asthma Increased inhaler use, 3-5 times daily, due to fragrance exposure indicating asthma exacerbation. - Include asthma considerations in the letter for reasonable accommodation.  Deodorant sensitivity Sensitivity to natural deodorants causing burning and irritation. Advised to use Yuka app for ingredient assessment. - Recommend trying Salt and Stone deodorant. - Advise using the Yuka app to evaluate deodorant ingredients.  Follow-up Scheduled for regular six-month follow-ups with hematology and oncology team for ongoing monitoring. - Schedule follow-up appointment in six months.   All questions were answered. The patient knows to call the clinic with any problems, questions or concerns. We can certainly see the patient much sooner if necessary.  Total encounter time:45 minutes*in face-to-face visit time, chart  review, lab review, care coordination, order entry, and documentation of the encounter time.    Lillard Anes, NP 08/24/23 3:08 PM Medical Oncology and Hematology [facility] [address] [place], [place] [address] Tel. [phone]    Fax. [phone]  *Total Encounter Time as defined by the Centers for Medicare and Medicaid Services includes, in addition to the face-to-face time of a patient visit (documented in the note above) non-face-to-face time: obtaining and reviewing outside history, ordering and reviewing medications, tests or procedures, care coordination (communications with other health care professionals or caregivers) and documentation in the medical record.

## 2023-08-25 ENCOUNTER — Telehealth: Payer: Self-pay

## 2023-08-25 DIAGNOSIS — Z171 Estrogen receptor negative status [ER-]: Secondary | ICD-10-CM

## 2023-08-25 NOTE — Telephone Encounter (Signed)
 Pt returned call placed by NP. Advised pt of conversation below and assured pt she would receive upmost confidentiality. Advised pt of our policy regarding family opening charts and how this would result in termination. She is interested in break the glass security on her chart.   Pt is agreeable to referral back to PT for lymphedema. Order placed per NP

## 2023-08-25 NOTE — Telephone Encounter (Signed)
-----   Message from Noreene Filbert sent at 08/25/2023  3:34 PM EDT -----  ----- Message ----- From: Etheleen Sia, PT Sent: 08/24/2023  11:54 AM EDT To: Freddie Breech, NP; #  OK - if you can convince her to come see [place], that's probably best. We will work hard to maintain confidentiality. Ayana works at [employer] and doesn't ever come to our site so it really should be fine. Ms. Koren Bound has seen [place] a lot in the past but I bet she came to [place] when we were at [facility]. Karrie Meres and Tawni Carnes, please see below in case this pt comes back to [place]. ----- Message ----- From: Loa Socks, NP Sent: 08/24/2023  11:46 AM EDT To: Etheleen Sia, PT  S/p mastectomy, has sozo and sees PT on occasion for tightness in shoulder ----- Message ----- From: Etheleen Sia, PT Sent: 08/24/2023  11:42 AM EDT To: Loa Socks, NP  Hey there. She definitely doesn't work at the [employer]. I'm investigating whether she works at any of our sites (maybe the one on [address]?). If she wants to come to our clinic, I can let Tawni Carnes (our front office person) know that she wants to maintain confidentiality (which we do anyway). Or I can look into a non-[facility] clinic. What does she need PT for? 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    1.  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  2. Is there another location this patient could get PT in [place]?  Thanks, LC

## 2023-08-31 ENCOUNTER — Encounter: Payer: Self-pay | Admitting: *Deleted

## 2023-08-31 NOTE — Progress Notes (Signed)
Per MD request RN placed call to pt with recent Guardant Reveal results being negative.  No answer, LVM for pt to return call to the office.

## 2023-09-01 ENCOUNTER — Encounter: Payer: Self-pay | Admitting: Hematology and Oncology

## 2023-09-07 ENCOUNTER — Other Ambulatory Visit: Payer: Self-pay

## 2023-09-07 ENCOUNTER — Ambulatory Visit: Attending: Adult Health | Admitting: Physical Therapy

## 2023-09-07 ENCOUNTER — Encounter: Payer: Self-pay | Admitting: Physical Therapy

## 2023-09-07 DIAGNOSIS — M62838 Other muscle spasm: Secondary | ICD-10-CM | POA: Diagnosis present

## 2023-09-07 DIAGNOSIS — Z171 Estrogen receptor negative status [ER-]: Secondary | ICD-10-CM | POA: Diagnosis present

## 2023-09-07 DIAGNOSIS — C50412 Malignant neoplasm of upper-outer quadrant of left female breast: Secondary | ICD-10-CM | POA: Diagnosis present

## 2023-09-07 DIAGNOSIS — R293 Abnormal posture: Secondary | ICD-10-CM | POA: Diagnosis present

## 2023-09-07 DIAGNOSIS — I972 Postmastectomy lymphedema syndrome: Secondary | ICD-10-CM | POA: Diagnosis present

## 2023-09-07 DIAGNOSIS — M25612 Stiffness of left shoulder, not elsewhere classified: Secondary | ICD-10-CM | POA: Diagnosis present

## 2023-09-07 NOTE — Therapy (Signed)
 OUTPATIENT PHYSICAL THERAPY  UPPER EXTREMITY ONCOLOGY EVALUATION  Patient Name: Paula Berry MRN: [id number] DOB:1971-03-03, 53 y.o., female Today's Date: 09/07/2023  END OF SESSION:  PT End of Session - 09/07/23 1547     Visit Number 1    Number of Visits 9    Date for PT Re-Evaluation 10/05/23    PT Start Time 1505    PT Stop Time 1551    PT Time Calculation (min) 46 min             Past Medical History:  Diagnosis Date   Anemia    had iron infusion 08/12/15   Asthma    Breast cancer (HCC) 03/2018   Left Breast Cancer   Cancer (HCC)    Breast cancer    Genital herpes    GERD (gastroesophageal reflux disease)    Headache    migraines 2-3 times monthly   Personal history of chemotherapy    Personal history of radiation therapy    Pneumonia    hospitalizied for pneumonia as a child   Past Surgical History:  Procedure Laterality Date   ABDOMINAL HYSTERECTOMY Bilateral 08/22/2015   Procedure: HYSTERECTOMY ABDOMINAL, BILATERAL SALPINGECTOMY;  Surgeon: Harold Hedge, MD;  Location: [facility];  Service: Gynecology;  Laterality: Bilateral;   BREAST BIOPSY Right 07/22/2022   [place] RT BREAST BX W LOC DEV 1ST LESION IMG BX SPEC US GUIDE 07/22/2022 [facility]   MASTECTOMY Left 07/2018   MASTECTOMY WITH AXILLARY LYMPH NODE DISSECTION Left 08/30/2018   Procedure: Left mastectomy with axillary lymph node dissection;  Surgeon: Emelia Loron, MD;  Location: [facility];  Service: General;  Laterality: Left;   MYOMECTOMY ABDOMINAL APPROACH     PORTACATH PLACEMENT N/A 03/30/2018   Procedure: INSERTION PORT-A-CATH WITH [place];  Surgeon: Emelia Loron, MD;  Location: [facility] OR;  Service: General;  Laterality: N/A;   Patient Active Problem List   Diagnosis Date Noted   Chronic rhinitis 10/13/2022   Seasonal allergic conjunctivitis 10/13/2022   Dysfunction of both eustachian tubes 10/13/2022   Obesity 08/21/2021   Gastroesophageal reflux disease  08/21/2021   Seasonal and perennial allergic rhinitis 02/24/2021   Chronic idiopathic urticaria 02/24/2021   Moderate persistent asthma without complication 02/24/2021   S/P mastectomy, left 08/30/2018   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   Bronchiectasis (HCC) 07/21/2018   Genetic testing 05/03/2018   Malignant neoplasm of upper-outer quadrant of left breast in female, estrogen receptor negative (HCC) 03/21/2018   Migraine 11/21/2015   Fibroids 08/22/2015    PCP: Leodis Sias, MD  REFERRING PROVIDER: Lillard Anes, NP  REFERRING DIAG: C50.412,Z17.1 (ICD-10-CM) - Malignant neoplasm of upper-outer quadrant of left breast in female, estrogen receptor negative (HCC)   THERAPY DIAG:  Postmastectomy lymphedema  Stiffness of left shoulder, not elsewhere classified  Other muscle spasm  Abnormal posture  Malignant neoplasm of upper-outer quadrant of left breast in female, estrogen receptor negative (HCC)  ONSET DATE: 08/30/2018  Rationale for Evaluation and Treatment: Rehabilitation  SUBJECTIVE:  SUBJECTIVE STATEMENT: My shoulder feels uncomfortable. I feel like I always need to move it around when sitting. I can see the difference in my arms when I look at pictures. I have been stretching constantly. I do self massage every day. I sit in certain positions to help stretch. I sometimes wear a sleeve when I feel like the upper arm is getting heavy.   PERTINENT HISTORY: Triple negative breast cancer. Ki67- 30%. S/p Lt modified radical mastectomy on 08/30/18 with Dr. [name]. SLNB x 12 lymph nodes all negative. Chemo and radiation completed.   PAIN:  Are you having pain? Yes NPRS scale: 4/10 Pain location: L shoulder and UE Pain orientation: Left  PAIN TYPE: dull, tight, and heavy Pain description: constant   Aggravating factors: sleeping, sitting for long periods Relieving factors: movement  PRECAUTIONS: Other: LUE lymphedema  RED FLAGS: None   WEIGHT BEARING RESTRICTIONS: No  FALLS:  Has patient fallen in last 6 months? No  LIVING ENVIRONMENT: Lives with: lives with their spouse Lives in: House/apartment Stairs: Yes; Internal: 13 steps; on right going up and another set of 13 steps with rail on left going up Has following equipment at home: None  OCCUPATION: full time, school social worker  LEISURE: exercises 3-5x/wk - step aerobics, zumba, cycle at the gym, line dance  HAND DOMINANCE: right   PRIOR LEVEL OF FUNCTION: Independent  PATIENT GOALS: to feel better and more confident and to have proper healing, reduce swelling   OBJECTIVE: Note: Objective measures were completed at Evaluation unless otherwise noted.  COGNITION: Overall cognitive status: Within functional limits for tasks assessed   PALPATION: Increased muscle tightness palpable in L lats, rhomboids, serratus  OBSERVATIONS / OTHER ASSESSMENTS: decreased L scapular upward rotation  POSTURE: forward head rounded shoulders  UPPER EXTREMITY AROM/PROM:  A/PROM RIGHT   eval   Shoulder extension 62  Shoulder flexion 166  Shoulder abduction 168  Shoulder internal rotation 50  Shoulder external rotation 83    (Blank rows = not tested)  A/PROM LEFT   eval  Shoulder extension 54  Shoulder flexion 159  Shoulder abduction 171  Shoulder internal rotation 51  Shoulder external rotation 81    (Blank rows = not tested)    LYMPHEDEMA ASSESSMENTS:   SURGERY TYPE/DATE: 08/30/2018 L mastectomy and ALND 0/12  NUMBER OF LYMPH NODES REMOVED: 0/12  CHEMOTHERAPY: completed prior to surgery  RADIATION: 6 weeks completed 2021  HORMONE TREATMENT: none  INFECTIONS: none   LYMPHEDEMA ASSESSMENTS:   LANDMARK RIGHT  eval  At axilla  39  15 cm proximal to olecranon process 34.5  10 cm proximal to olecranon  process 32.1  Olecranon process 27  15 cm proximal to ulnar styloid process 25.3  10 cm proximal to ulnar styloid process 20  Just proximal to ulnar styloid process 16  Across hand at thumb web space 19  At base of 2nd digit 6.1  (Blank rows = not tested)  LANDMARK LEFT  eval  At axilla  37.9  15 cm proximal to olecranon process 35  10 cm proximal to olecranon process 32.5  Olecranon process 27  15 cm proximal to ulnar styloid process 24.6  10 cm proximal to ulnar styloid process 20.2  Just proximal to ulnar styloid process 16.2  Across hand at thumb web space 18.9  At base of 2nd digit 6  (Blank rows = not tested)   LYMPHEDEMA LIFE IMPACT SURVEY: 41.18  TREATMENT DATE:  09/07/23: In R sidelying to L scapula: scapular mobility in to upward rotation which is limited at end range. Gentle STM to serratus, rhomboids, lats    PATIENT EDUCATION:  Education details: using tennis ball on wall for self soft tissue mobilization Person educated: Patient Education method: Explanation Education comprehension: verbalized understanding  HOME EXERCISE PROGRAM: Use tennis ball for self soft tissue mobilization  ASSESSMENT:  CLINICAL IMPRESSION: Patient is a 53 y.o. female who was seen today for physical therapy evaluation and treatment for decreased L shoulder ROM, muscle tightness and L UE lymphedema. Circumference measurements are very similar between L and R UEs but pt does have fullness at upper L UE but is managing well with her current regiment of daily MLD. Pt has discomfort when sitting for long periods and feels better with activity. She has L pec tightness and increased muscle tightness in lat, serratus and rhomboids on L following L mastectomy on 08/30/2018 for treatment of triple negative L breast cancer. She would benefit from skilled PT services to improve L  shoulder ROM, assess independence with self MLD, decrease muscle tightness and progress pt towards independence with a home exercise program.     OBJECTIVE IMPAIRMENTS: decreased ROM, increased edema, increased fascial restrictions, increased muscle spasms, postural dysfunction, and pain.   ACTIVITY LIMITATIONS: carrying, lifting, and sitting  PARTICIPATION LIMITATIONS: cleaning and laundry  PERSONAL FACTORS: Time since onset of injury/illness/exacerbation are also affecting patient's functional outcome.   REHAB POTENTIAL: Good  CLINICAL DECISION MAKING: Stable/uncomplicated  EVALUATION COMPLEXITY: Low  GOALS: Goals reviewed with patient? Yes  SHORT TERM GOALS=LONG TERM GOALS Target date: 10/05/23  Pt will be independent in self MLD for long term management of LUE lymphedema. Baseline: Goal status: INITIAL  2.  Pt will report she is able to sleep through the night and wake up without discomfort in her L shoulder.  Baseline:  Goal status: INITIAL  3.  Pt will demonstrate 166 degrees of L shoulder flexion to allow her to reach overhead without tightness.  Baseline:  Goal status: INITIAL  4.  Pt will report a 75% improvement in feelings of discomfort in L scapular muscles to allow improved comfort.  Baseline:  Goal status: INITIAL  5.  Pt will be independent in a home exercise program for continued stretching and strengthening.  Baseline:  Goal status: INITIAL   PLAN:  PT FREQUENCY: 2x/week  PT DURATION: 4 weeks  PLANNED INTERVENTIONS: 97164- PT Re-evaluation, 97110-Therapeutic exercises, 97530- Therapeutic activity, 97112- Neuromuscular re-education, 97535- Self Care, 16109- Manual therapy, [phone]- Orthotic Fit/training, Patient/Family education, and Joint mobilization  PLAN FOR NEXT SESSION: assess independence with self MLD, L scap mobs, pec stretches on foam, supine scap eventually, STM to serratus, rhomboids, lats on L  [employer], PT 09/07/2023, 4:06  PM

## 2023-09-16 LAB — GUARDANT REVEAL

## 2023-10-05 ENCOUNTER — Other Ambulatory Visit: Payer: Self-pay | Admitting: Obstetrics and Gynecology

## 2023-10-05 DIAGNOSIS — Z1231 Encounter for screening mammogram for malignant neoplasm of breast: Secondary | ICD-10-CM

## 2023-10-07 ENCOUNTER — Other Ambulatory Visit: Payer: Self-pay | Admitting: Obstetrics and Gynecology

## 2023-10-07 ENCOUNTER — Ambulatory Visit
Admission: RE | Admit: 2023-10-07 | Discharge: 2023-10-07 | Disposition: A | Discharge status: Home / Self Care | Source: Ambulatory Visit | Attending: Obstetrics and Gynecology | Admitting: Obstetrics and Gynecology

## 2023-10-07 DIAGNOSIS — Z1231 Encounter for screening mammogram for malignant neoplasm of breast: Secondary | ICD-10-CM

## 2023-10-13 ENCOUNTER — Telehealth: Payer: Self-pay

## 2023-10-18 NOTE — Telephone Encounter (Signed)
 Enter in error

## 2024-02-09 ENCOUNTER — Telehealth: Payer: Self-pay

## 2024-02-14 NOTE — Telephone Encounter (Signed)
 Enter in error

## 2024-02-23 ENCOUNTER — Other Ambulatory Visit: Payer: Self-pay | Admitting: *Deleted

## 2024-02-23 DIAGNOSIS — Z171 Estrogen receptor negative status [ER-]: Secondary | ICD-10-CM

## 2024-02-24 ENCOUNTER — Inpatient Hospital Stay (HOSPITAL_BASED_OUTPATIENT_CLINIC_OR_DEPARTMENT_OTHER): Admitting: Hematology and Oncology

## 2024-02-24 ENCOUNTER — Inpatient Hospital Stay: Attending: Hematology and Oncology

## 2024-02-24 VITALS — BP 123/76 | HR 79 | Temp 98.1°F | Resp 17 | Wt 229.5 lb

## 2024-02-24 DIAGNOSIS — E663 Overweight: Secondary | ICD-10-CM | POA: Diagnosis not present

## 2024-02-24 DIAGNOSIS — Z9221 Personal history of antineoplastic chemotherapy: Secondary | ICD-10-CM | POA: Diagnosis not present

## 2024-02-24 DIAGNOSIS — Z853 Personal history of malignant neoplasm of breast: Secondary | ICD-10-CM | POA: Insufficient documentation

## 2024-02-24 DIAGNOSIS — Z9012 Acquired absence of left breast and nipple: Secondary | ICD-10-CM | POA: Diagnosis not present

## 2024-02-24 DIAGNOSIS — C50412 Malignant neoplasm of upper-outer quadrant of left female breast: Secondary | ICD-10-CM

## 2024-02-24 DIAGNOSIS — Z171 Estrogen receptor negative status [ER-]: Secondary | ICD-10-CM | POA: Diagnosis not present

## 2024-02-24 DIAGNOSIS — Z08 Encounter for follow-up examination after completed treatment for malignant neoplasm: Secondary | ICD-10-CM | POA: Diagnosis present

## 2024-02-24 DIAGNOSIS — N951 Menopausal and female climacteric states: Secondary | ICD-10-CM | POA: Insufficient documentation

## 2024-02-24 DIAGNOSIS — Z923 Personal history of irradiation: Secondary | ICD-10-CM | POA: Diagnosis not present

## 2024-02-24 LAB — CBC WITH DIFFERENTIAL (CANCER CENTER ONLY)
Abs Immature Granulocytes: 0.01 K/uL (ref 0.00–0.07)
Basophils Absolute: 0 K/uL (ref 0.0–0.1)
Basophils Relative: 1 %
Eosinophils Absolute: 0.2 K/uL (ref 0.0–0.5)
Eosinophils Relative: 3 %
HCT: 43.2 % (ref 36.0–46.0)
Hemoglobin: 14.4 g/dL (ref 12.0–15.0)
Immature Granulocytes: 0 %
Lymphocytes Relative: 45 %
Lymphs Abs: 2.7 K/uL (ref 0.7–4.0)
MCH: 31.4 pg (ref 26.0–34.0)
MCHC: 33.3 g/dL (ref 30.0–36.0)
MCV: 94.3 fL (ref 80.0–100.0)
Monocytes Absolute: 0.4 K/uL (ref 0.1–1.0)
Monocytes Relative: 7 %
Neutro Abs: 2.7 K/uL (ref 1.7–7.7)
Neutrophils Relative %: 44 %
Platelet Count: 210 K/uL (ref 150–400)
RBC: 4.58 MIL/uL (ref 3.87–5.11)
RDW: 12.3 % (ref 11.5–15.5)
WBC Count: 6 K/uL (ref 4.0–10.5)
nRBC: 0 % (ref 0.0–0.2)

## 2024-02-24 LAB — CMP (CANCER CENTER ONLY)
ALT: 8 U/L (ref 0–44)
AST: 11 U/L — ABNORMAL LOW (ref 15–41)
Albumin: 4.3 g/dL (ref 3.5–5.0)
Alkaline Phosphatase: 68 U/L (ref 38–126)
Anion gap: 5 (ref 5–15)
BUN: 12 mg/dL (ref 6–20)
CO2: 31 mmol/L (ref 22–32)
Calcium: 9.3 mg/dL (ref 8.9–10.3)
Chloride: 106 mmol/L (ref 98–111)
Creatinine: 0.76 mg/dL (ref 0.44–1.00)
GFR, Estimated: >60 mL/min (ref >60)
Glucose, Bld: 89 mg/dL (ref 70–99)
Potassium: 4.2 mmol/L (ref 3.5–5.1)
Sodium: 142 mmol/L (ref 135–145)
Total Bilirubin: 1 mg/dL (ref 0.0–1.2)
Total Protein: 6.9 g/dL (ref 6.5–8.1)

## 2024-02-24 NOTE — Assessment & Plan Note (Signed)
 03/16/2018 for a clinical T2 N3, stage IIIC invasive ductal carcinoma, triple negative, with an MIB-1 of 15% Neoadjuvant chemotherapy consisting of doxorubicin  and cyclophosphamide  in dose dense fashion x4 started 04/07/2018, completed 05/19/2018,  followed by weekly carboplatin  and paclitaxel  x10 started 06/02/2018, last dose 08/04/2018 (stopped two cycles early due to peripheral neuropathy) 08/30/2018: Left mastectomy: T1 a N0 grade 2 IDC with negative margins 12 axillary lymph nodes negative, triple negative Ki-67 30% 11/22/2018: Completed adjuvant radiation 10/31/2018: Insufficient tissue for Caris testing   Breast cancer surveillance: 1.  Breast exam 02/09/2022: Benign 2. mammogram: Right breast: 07/14/2022: 0 and 8 cm right breast mass.  Breast density category B 3.  07/28/2022: Ultrasound-guided biopsy: Right breast: Fibrocystic change with UDH       Breast Cancer Surveillance: Breast exam 02/24/2024: Benign Mammogram 10/11/2023: Benign breast density category C  Return to clinic in 1 year for follow-up

## 2024-02-24 NOTE — Progress Notes (Signed)
 Patient Care Team: Cyrena Gwenn SQUIBB, MD (Inactive) as PCP - General (Family Medicine) Ebbie Cough, MD as Consulting Physician (General Surgery) Izell Domino, MD as Attending Physician (Radiation Oncology) Curlene Agent, MD as Consulting Physician (Obstetrics and Gynecology) Lennon Nest, MD (Inactive) as Attending Physician (Radiology) Odean Potts, MD as Consulting Physician (Hematology and Oncology)  DIAGNOSIS:  Encounter Diagnosis  Name Primary?   Malignant neoplasm of upper-outer quadrant of left breast in female, estrogen receptor negative (HCC) Yes    SUMMARY OF ONCOLOGIC HISTORY: Oncology History  Malignant neoplasm of upper-outer quadrant of left breast in female, estrogen receptor negative (HCC)  03/21/2018 Initial Diagnosis   Malignant neoplasm of upper-outer quadrant of left breast in female, estrogen receptor negative (HCC)   04/07/2018 - 08/10/2018 Chemotherapy   DOXOrubicin  (ADRIAMYCIN ) chemo injection 130 mg, 60 mg/m2 = 130 mg, Intravenous,  Once, 4 of 4 cycles. Administration: 130 mg (04/07/2018), 130 mg (04/21/2018), 130 mg (05/05/2018), 130 mg (05/19/2018)  palonosetron  (ALOXI ) injection 0.25 mg, 0.25 mg, Intravenous,  Once, 14 of 16 cycles. Administration: 0.25 mg (04/07/2018), 0.25 mg (06/02/2018), 0.25 mg (04/21/2018), 0.25 mg (05/05/2018), 0.25 mg (05/19/2018), 0.25 mg (06/09/2018), 0.25 mg (06/16/2018), 0.25 mg (06/23/2018), 0.25 mg (06/30/2018), 0.25 mg (07/07/2018), 0.25 mg (07/14/2018), 0.25 mg (07/21/2018), 0.25 mg (07/28/2018), 0.25 mg (08/04/2018)  pegfilgrastim -cbqv (UDENYCA ) injection 6 mg, 6 mg, Subcutaneous, Once, 4 of 4 cycles. Administration: 6 mg (04/09/2018), 6 mg (04/23/2018), 6 mg (05/07/2018), 6 mg (05/21/2018)  CARBOplatin  (PARAPLATIN ) 300 mg in sodium chloride  0.9 % 250 mL chemo infusion, 300 mg (100 % of original dose 300 mg), Intravenous,  Once, 10 of 12 cycles. Dose modification:   (original dose 300 mg, Cycle 5). Administration: 300 mg (06/02/2018),  300 mg (06/09/2018), 300 mg (06/16/2018), 300 mg (06/23/2018), 300 mg (06/30/2018), 300 mg (07/07/2018), 300 mg (07/14/2018), 300 mg (07/21/2018), 300 mg (07/28/2018), 300 mg (08/04/2018)  cyclophosphamide  (CYTOXAN ) 1,300 mg in sodium chloride  0.9 % 250 mL chemo infusion, 600 mg/m2 = 1,300 mg, Intravenous,  Once, 4 of 4 cycles. Administration: 1,300 mg (04/07/2018), 1,300 mg (04/21/2018), 1,300 mg (05/05/2018), 1,300 mg (05/19/2018)  PACLitaxel  (TAXOL ) 174 mg in sodium chloride  0.9 % 250 mL chemo infusion (</= 80mg /m2), 80 mg/m2 = 174 mg, Intravenous,  Once, 10 of 12 cycles. Administration: 174 mg (06/02/2018), 174 mg (06/09/2018), 174 mg (06/16/2018), 174 mg (06/23/2018), 174 mg (06/30/2018), 174 mg (07/07/2018), 174 mg (07/14/2018), 174 mg (07/21/2018), 174 mg (07/28/2018), 174 mg (08/04/2018)  fosaprepitant  (EMEND ) 150 mg  dexamethasone  (DECADRON ) 12 mg in sodium chloride  0.9 % 145 mL IVPB, , Intravenous,  Once, 14 of 16 cycles. Administration:  (04/07/2018),  (06/02/2018),  (04/21/2018),  (05/05/2018),  (05/19/2018),  (06/09/2018),  (06/16/2018),  (06/23/2018),  (06/30/2018),  (07/07/2018),  (07/14/2018),  (07/21/2018),  (07/28/2018),  (08/04/2018)    04/27/2018 Genetic Testing   Negative genetic testing on the multi-cancer panel.  The Multi-Gene Panel offered by [facility] includes sequencing and/or deletion duplication testing of the following 84 genes: AIP, ALK, APC, ATM, AXIN2,BAP1,  BARD1, BLM, BMPR1A, BRCA1, BRCA2, BRIP1, CASR, CDC73, CDH1, CDK4, CDKN1B, CDKN1C, CDKN2A (p14ARF), CDKN2A (p16INK4a), CEBPA, CHEK2, CTNNA1, DICER1, DIS3L2, EGFR (c.2369C>T, p.Thr790Met variant only), EPCAM (Deletion/duplication testing only), FH, FLCN, GATA2, GPC3, GREM1 (Promoter region deletion/duplication testing only), HOXB13 (c.251G>A, p.Gly84Glu), HRAS, KIT, MAX, MEN1, MET, MITF (c.952G>A, p.Glu318Lys variant only), MLH1, MSH2, MSH3, MSH6, MUTYH, NBN, NF1, NF2, NTHL1, PALB2, PDGFRA, PHOX2B, PMS2, POLD1, POLE, POT1, PRKAR1A, PTCH1, PTEN, RAD50, RAD51C,  RAD51D, RB1, RECQL4, RET, RUNX1, SDHAF2, SDHA (sequence changes only), SDHB,  SDHC, SDHD, SMAD4, SMARCA4, SMARCB1, SMARCE1, STK11, SUFU, TERC, TERT, TMEM127, TP53, TSC1, TSC2, VHL, WRN and WT1.  The report date is April 27, 2018.   08/24/2018 Cancer Staging   Staging form: Breast, AJCC 8th Edition - Pathologic: No Stage Recommended (ypT1a, pN0, cM0, G2, ER-, PR-, HER2-) - Signed by Izell Domino, MD on 09/28/2018   08/30/2018 Surgery   Left mastectomy [name] revealed residual IDC, grade 2, negative margins. 12 axillary lymph nodes were negative for carcinoma. Triple negative, MIB-1 of 30%.   10/26/2018 - 11/22/2018 Radiation Therapy   The patient initially received a dose of 50.4 Gy in 28 fractions to the breast using whole-breast tangent fields. This was delivered using a 3-D conformal technique. The pt received a boost delivering an additional 10 Gy in 5 fractions using a electron boost with electrons. The total dose was 60.4 Gy.     CHIEF COMPLIANT: Surveillance of breast cancer  HISTORY OF PRESENT ILLNESS:   History of Present Illness Paula Berry is a 53 year old female who presents with shoulder aches and stiffness.  She experiences persistent aches and stiffness in her shoulders, unaffected by sleeping position. Despite regular physical activities such as yoga, Pilates, and Zumba, the symptoms persist. No symptoms extend beyond the shoulder area.  She has a history of benign breast findings, with her last mammogram in May showing no abnormalities. She is scheduled for a circulating tumor DNA test at home.  She is experiencing menopausal symptoms, including cognitive fogginess, weight gain, and severe hot flashes. Despite consistent exercise, she finds weight loss challenging, attributing it to menopause.     ALLERGIES:  is allergic to bacitra-neomycin-polymyxin-hc, neosporin [neomycin-bacitracin zn-polymyx], cat dander, molds & smuts, and  other.  MEDICATIONS:  Current Outpatient Medications  Medication Sig Dispense Refill   acetaminophen  (TYLENOL ) 500 MG tablet Take 1,000 mg by mouth every 8 (eight) hours as needed for moderate pain.     albuterol  (VENTOLIN  HFA) 108 (90 Base) MCG/ACT inhaler Inhale 2 puffs into the lungs every 6 (six) hours as needed for wheezing or shortness of breath. 1 each 2   budesonide -formoterol  (SYMBICORT ) 160-4.5 MCG/ACT inhaler Inhale 2 puffs into the lungs 2 (two) times daily. 1 each 5   cimetidine (TAGAMET) 400 MG tablet Take 400 mg by mouth 3 (three) times daily.     EPINEPHrine 0.3 mg/0.3 mL IJ SOAJ injection Inject 0.3 mg into the muscle once.     hydrOXYzine (VISTARIL) 25 MG capsule Take 25-50 mg by mouth at bedtime.     loratadine  (CLARITIN ) 10 MG tablet TAKE 1 TABLET BY MOUTH TWICE DAILY FOR HIVES, CAN INCREASE TO MAXIMUM DOSE OF 2 TABLETS TWICE DAILY IF NEEDED 30 tablet 0   meloxicam  (MOBIC ) 15 MG tablet Take 1 tablet (15 mg total) by mouth daily. 30 tablet 0   montelukast  (SINGULAIR ) 10 MG tablet TAKE 1 TABLET(10 MG) BY MOUTH EVERY EVENING 30 tablet 5   pantoprazole  (PROTONIX ) 40 MG tablet Take 1 tablet (40 mg total) by mouth every evening. 30 tablet 5   valACYclovir (VALTREX) 500 MG tablet Take 500 mg by mouth See admin instructions. Take 500 mg twice daily for 3 days at onset of outbreak then take 500 mg once daily until clear     No current facility-administered medications for this visit.    PHYSICAL EXAMINATION: ECOG PERFORMANCE STATUS: 1 - Symptomatic but completely ambulatory  Vitals:   02/24/24 1452  BP: 123/76  Pulse: 79  Resp: 17  Temp: 98.1 F (36.7 C)  SpO2: 98%   Filed Weights   02/24/24 1452  Weight: 229 lb 8 oz (104.1 kg)    Physical Exam No palpable lumps or nodules in bilateral breasts or axilla.  Left mastectomy scar without any lumps or nodules  (exam performed in the presence of a chaperone)  LABORATORY DATA:  I have reviewed the data as listed     Latest Ref Rng & Units 02/09/2022    2:20 PM 08/21/2021    1:43 PM 05/07/2021   10:23 AM  CMP  Glucose 70 - 99 mg/dL 885  884  88   BUN 6 - 20 mg/dL 11  17  14    Creatinine 0.44 - 1.00 mg/dL 9.29  9.35  9.10   Sodium 135 - 145 mmol/L 139  140  145   Potassium 3.5 - 5.1 mmol/L 3.7  3.7  4.7   Chloride 98 - 111 mmol/L 106  108  110   CO2 22 - 32 mmol/L 30  25  22    Calcium 8.9 - 10.3 mg/dL 9.2  9.6  9.5   Total Protein 6.5 - 8.1 g/dL 6.9  6.8  7.3   Total Bilirubin 0.3 - 1.2 mg/dL 1.1  1.0  1.2   Alkaline Phos 38 - 126 U/L 67  73  68   AST 15 - 41 U/L 17  13  14    ALT 0 - 44 U/L 12  11  11      Lab Results  Component Value Date   WBC 6.0 02/24/2024   HGB 14.4 02/24/2024   HCT 43.2 02/24/2024   MCV 94.3 02/24/2024   PLT 210 02/24/2024   NEUTROABS 2.7 02/24/2024    ASSESSMENT & PLAN:  Malignant neoplasm of upper-outer quadrant of left breast in female, estrogen receptor negative (HCC) 03/16/2018 for a clinical T2 N3, stage IIIC invasive ductal carcinoma, triple negative, with an MIB-1 of 15% Neoadjuvant chemotherapy consisting of doxorubicin  and cyclophosphamide  in dose dense fashion x4 started 04/07/2018, completed 05/19/2018,  followed by weekly carboplatin  and paclitaxel  x10 started 06/02/2018, last dose 08/04/2018 (stopped two cycles early due to peripheral neuropathy) 08/30/2018: Left mastectomy: T1 a N0 grade 2 IDC with negative margins 12 axillary lymph nodes negative, triple negative Ki-67 30% 11/22/2018: Completed adjuvant radiation 10/31/2018: Insufficient tissue for Caris testing   Breast cancer surveillance: 1.  Breast exam 02/24/2024: Benign 2. mammogram: Right breast: 10/11/2023: Benign.  Breast density category C 3.  07/28/2022: Ultrasound-guided biopsy: Right breast: Fibrocystic change with UDH 4.  Guardant reveal for MRD testing    Return to clinic in 1 year for follow-up   Assessment & Plan Malignant neoplasm of upper-outer quadrant of left breast, estrogen receptor  negative Cancer-free for six years with a normal mammogram in May. Discussed CT DNA test as a new monitoring method for recurrence, providing reassurance if zero. - Perform circulating tumor DNA test every six months. - Call with results of the CT DNA test until available in her online chart.  Menopausal symptoms Experiencing fogginess, weight gain, and hot flashes. Non-hormonal treatments preferred due to breast cancer history. - Recommend valerian root supplement for fogginess and hot flashes. - Avoid hormonal treatments due to breast cancer history.  Overweight in the context of menopause Weight gain despite exercise. Discussed weight loss injections, generally safe and effective but may cause weight regain after stopping. Insurance coverage may be a barrier. - Check insurance coverage for weight loss injections. - If covered, initiate weight  loss injections through primary care with gradual dose increase. - Consider future oral weight loss medication options.      No orders of the defined types were placed in this encounter.  The patient has a good understanding of the overall plan. she agrees with it. she will call with any problems that may develop before the next visit here. Total time spent: 30 mins including face to face time and time spent for planning, charting and co-ordination of care   Viinay K Sherissa Tenenbaum, MD 02/24/24

## 2024-03-29 ENCOUNTER — Encounter: Payer: Self-pay | Admitting: Hematology and Oncology

## 2024-03-29 ENCOUNTER — Telehealth: Payer: Self-pay | Admitting: *Deleted

## 2024-03-29 NOTE — Telephone Encounter (Signed)
 Per MD request RN placed call to pt with recent Guardant Reveal results being negative.  Pt educated and verbalized understanding.

## 2024-05-18 ENCOUNTER — Other Ambulatory Visit (HOSPITAL_COMMUNITY): Payer: Self-pay

## 2024-05-18 DIAGNOSIS — M4316 Spondylolisthesis, lumbar region: Secondary | ICD-10-CM

## 2024-05-18 DIAGNOSIS — M545 Low back pain, unspecified: Secondary | ICD-10-CM

## 2024-05-18 DIAGNOSIS — M25551 Pain in right hip: Secondary | ICD-10-CM

## 2024-07-12 ENCOUNTER — Ambulatory Visit (HOSPITAL_COMMUNITY)

## 2024-08-24 ENCOUNTER — Inpatient Hospital Stay: Admitting: Adult Health

## 2024-08-24 ENCOUNTER — Inpatient Hospital Stay

## 2025-02-27 ENCOUNTER — Ambulatory Visit: Admitting: Hematology and Oncology
